# Patient Record
Sex: Female | Born: 1965 | Race: White | Hispanic: No | Marital: Married | Smoking: Never smoker
Health system: Southern US, Community
[De-identification: ages and names within clinical notes are randomized; demographics above are authoritative.]

## PROBLEM LIST (undated history)

## (undated) DIAGNOSIS — I519 Heart disease, unspecified: Secondary | ICD-10-CM

## (undated) DIAGNOSIS — G5600 Carpal tunnel syndrome, unspecified upper limb: Secondary | ICD-10-CM

## (undated) DIAGNOSIS — Z8489 Family history of other specified conditions: Secondary | ICD-10-CM

## (undated) DIAGNOSIS — M5134 Other intervertebral disc degeneration, thoracic region: Secondary | ICD-10-CM

## (undated) DIAGNOSIS — R011 Cardiac murmur, unspecified: Secondary | ICD-10-CM

## (undated) DIAGNOSIS — Z22338 Carrier of other streptococcus: Secondary | ICD-10-CM

## (undated) DIAGNOSIS — E669 Obesity, unspecified: Secondary | ICD-10-CM

## (undated) DIAGNOSIS — T7840XA Allergy, unspecified, initial encounter: Secondary | ICD-10-CM

## (undated) DIAGNOSIS — M810 Age-related osteoporosis without current pathological fracture: Secondary | ICD-10-CM

## (undated) DIAGNOSIS — M5136 Other intervertebral disc degeneration, lumbar region: Secondary | ICD-10-CM

## (undated) DIAGNOSIS — Z8719 Personal history of other diseases of the digestive system: Secondary | ICD-10-CM

## (undated) DIAGNOSIS — M51369 Other intervertebral disc degeneration, lumbar region without mention of lumbar back pain or lower extremity pain: Secondary | ICD-10-CM

## (undated) DIAGNOSIS — R002 Palpitations: Secondary | ICD-10-CM

## (undated) DIAGNOSIS — M1990 Unspecified osteoarthritis, unspecified site: Secondary | ICD-10-CM

## (undated) DIAGNOSIS — K219 Gastro-esophageal reflux disease without esophagitis: Secondary | ICD-10-CM

## (undated) DIAGNOSIS — M069 Rheumatoid arthritis, unspecified: Secondary | ICD-10-CM

## (undated) DIAGNOSIS — E559 Vitamin D deficiency, unspecified: Secondary | ICD-10-CM

## (undated) DIAGNOSIS — E785 Hyperlipidemia, unspecified: Secondary | ICD-10-CM

## (undated) HISTORY — DX: Gastro-esophageal reflux disease without esophagitis: K21.9

## (undated) HISTORY — DX: Carrier of other streptococcus: Z22.338

## (undated) HISTORY — PX: KNEE ARTHROPLASTY: SHX992

## (undated) HISTORY — DX: Hyperlipidemia, unspecified: E78.5

## (undated) HISTORY — DX: Rheumatoid arthritis, unspecified: M06.9

## (undated) HISTORY — PX: NO PAST SURGERIES: SHX2092

## (undated) HISTORY — DX: Unspecified osteoarthritis, unspecified site: M19.90

## (undated) HISTORY — DX: Age-related osteoporosis without current pathological fracture: M81.0

## (undated) HISTORY — PX: UPPER GASTROINTESTINAL ENDOSCOPY: SHX188

## (undated) HISTORY — DX: Allergy, unspecified, initial encounter: T78.40XA

## (undated) HISTORY — DX: Cardiac murmur, unspecified: R01.1

---
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
## 1998-06-14 ENCOUNTER — Other Ambulatory Visit: Admission: RE | Admit: 1998-06-14 | Discharge: 1998-06-14 | Payer: Self-pay | Admitting: Family Medicine

## 1999-07-31 ENCOUNTER — Other Ambulatory Visit: Admission: RE | Admit: 1999-07-31 | Discharge: 1999-07-31 | Payer: Self-pay | Admitting: Family Medicine

## 2000-09-03 ENCOUNTER — Other Ambulatory Visit: Admission: RE | Admit: 2000-09-03 | Discharge: 2000-09-03 | Payer: Self-pay | Admitting: Family Medicine

## 2001-09-30 ENCOUNTER — Other Ambulatory Visit: Admission: RE | Admit: 2001-09-30 | Discharge: 2001-09-30 | Payer: Self-pay | Admitting: Family Medicine

## 2001-12-23 ENCOUNTER — Encounter: Payer: Self-pay | Admitting: Family Medicine

## 2001-12-23 ENCOUNTER — Encounter: Admission: RE | Admit: 2001-12-23 | Discharge: 2001-12-23 | Payer: Self-pay | Admitting: Family Medicine

## 2002-03-30 ENCOUNTER — Encounter: Payer: Self-pay | Admitting: Family Medicine

## 2002-03-30 ENCOUNTER — Encounter: Admission: RE | Admit: 2002-03-30 | Discharge: 2002-03-30 | Payer: Self-pay | Admitting: Family Medicine

## 2002-10-19 ENCOUNTER — Encounter (HOSPITAL_COMMUNITY): Payer: Self-pay | Admitting: Oncology

## 2002-10-19 ENCOUNTER — Ambulatory Visit (HOSPITAL_COMMUNITY): Admission: RE | Admit: 2002-10-19 | Discharge: 2002-10-19 | Payer: Self-pay | Admitting: Oncology

## 2002-11-03 ENCOUNTER — Other Ambulatory Visit: Admission: RE | Admit: 2002-11-03 | Discharge: 2002-11-03 | Payer: Self-pay | Admitting: Family Medicine

## 2003-11-14 ENCOUNTER — Other Ambulatory Visit: Admission: RE | Admit: 2003-11-14 | Discharge: 2003-11-14 | Payer: Self-pay | Admitting: Family Medicine

## 2004-07-17 ENCOUNTER — Ambulatory Visit: Payer: Self-pay | Admitting: Family Medicine

## 2004-07-19 ENCOUNTER — Encounter: Admission: RE | Admit: 2004-07-19 | Discharge: 2004-07-19 | Payer: Self-pay | Admitting: Family Medicine

## 2004-09-24 ENCOUNTER — Ambulatory Visit: Payer: Self-pay | Admitting: Family Medicine

## 2004-10-08 ENCOUNTER — Ambulatory Visit: Payer: Self-pay

## 2004-11-18 ENCOUNTER — Ambulatory Visit: Payer: Self-pay | Admitting: Internal Medicine

## 2004-12-05 ENCOUNTER — Other Ambulatory Visit: Admission: RE | Admit: 2004-12-05 | Discharge: 2004-12-05 | Payer: Self-pay | Admitting: Internal Medicine

## 2004-12-05 ENCOUNTER — Ambulatory Visit: Payer: Self-pay | Admitting: Family Medicine

## 2004-12-19 ENCOUNTER — Ambulatory Visit: Payer: Self-pay | Admitting: Family Medicine

## 2005-01-10 ENCOUNTER — Encounter: Admission: RE | Admit: 2005-01-10 | Discharge: 2005-01-10 | Payer: Self-pay | Admitting: Family Medicine

## 2005-07-24 ENCOUNTER — Ambulatory Visit: Payer: Self-pay | Admitting: Family Medicine

## 2005-12-16 ENCOUNTER — Other Ambulatory Visit: Admission: RE | Admit: 2005-12-16 | Discharge: 2005-12-16 | Payer: Self-pay | Admitting: Family Medicine

## 2005-12-16 ENCOUNTER — Encounter: Payer: Self-pay | Admitting: Family Medicine

## 2005-12-16 ENCOUNTER — Ambulatory Visit: Payer: Self-pay | Admitting: Family Medicine

## 2005-12-18 ENCOUNTER — Ambulatory Visit: Payer: Self-pay | Admitting: Family Medicine

## 2006-09-19 ENCOUNTER — Emergency Department (HOSPITAL_COMMUNITY): Admission: EM | Admit: 2006-09-19 | Discharge: 2006-09-19 | Payer: Self-pay | Admitting: Family Medicine

## 2006-12-28 ENCOUNTER — Ambulatory Visit: Payer: Self-pay | Admitting: Family Medicine

## 2006-12-28 ENCOUNTER — Other Ambulatory Visit: Admission: RE | Admit: 2006-12-28 | Discharge: 2006-12-28 | Payer: Self-pay | Admitting: Family Medicine

## 2006-12-28 ENCOUNTER — Encounter: Payer: Self-pay | Admitting: Family Medicine

## 2006-12-28 DIAGNOSIS — K219 Gastro-esophageal reflux disease without esophagitis: Secondary | ICD-10-CM | POA: Insufficient documentation

## 2006-12-28 DIAGNOSIS — K449 Diaphragmatic hernia without obstruction or gangrene: Secondary | ICD-10-CM | POA: Insufficient documentation

## 2006-12-28 DIAGNOSIS — Z8719 Personal history of other diseases of the digestive system: Secondary | ICD-10-CM | POA: Insufficient documentation

## 2006-12-28 DIAGNOSIS — Z8679 Personal history of other diseases of the circulatory system: Secondary | ICD-10-CM | POA: Insufficient documentation

## 2007-01-04 ENCOUNTER — Encounter (INDEPENDENT_AMBULATORY_CARE_PROVIDER_SITE_OTHER): Payer: Self-pay | Admitting: *Deleted

## 2007-01-11 ENCOUNTER — Ambulatory Visit: Payer: Self-pay | Admitting: Family Medicine

## 2007-01-19 LAB — CONVERTED CEMR LAB
ALT: 23 units/L (ref 0–35)
AST: 17 units/L (ref 0–37)
Albumin: 3.2 g/dL — ABNORMAL LOW (ref 3.5–5.2)
Alkaline Phosphatase: 68 units/L (ref 39–117)
Basophils Relative: 0.4 % (ref 0.0–1.0)
Calcium: 8.8 mg/dL (ref 8.4–10.5)
Creatinine, Ser: 0.6 mg/dL (ref 0.4–1.2)
Eosinophils Relative: 1 % (ref 0.0–5.0)
Glucose, Bld: 83 mg/dL (ref 70–99)
HCT: 39.1 % (ref 36.0–46.0)
LDL Cholesterol: 124 mg/dL — ABNORMAL HIGH (ref 0–99)
Lymphocytes Relative: 18.4 % (ref 12.0–46.0)
MCV: 92.6 fL (ref 78.0–100.0)
Neutrophils Relative %: 74 % (ref 43.0–77.0)
RDW: 13.5 % (ref 11.5–14.6)
TSH: 1.87 microintl units/mL (ref 0.35–5.50)
Total Bilirubin: 0.6 mg/dL (ref 0.3–1.2)
Total CHOL/HDL Ratio: 5.2
Triglycerides: 107 mg/dL (ref 0–149)
VLDL: 21 mg/dL (ref 0–40)
WBC: 11.8 10*3/uL — ABNORMAL HIGH (ref 4.5–10.5)

## 2007-01-20 ENCOUNTER — Telehealth (INDEPENDENT_AMBULATORY_CARE_PROVIDER_SITE_OTHER): Payer: Self-pay | Admitting: *Deleted

## 2007-02-09 ENCOUNTER — Telehealth (INDEPENDENT_AMBULATORY_CARE_PROVIDER_SITE_OTHER): Payer: Self-pay | Admitting: *Deleted

## 2007-03-02 ENCOUNTER — Encounter: Admission: RE | Admit: 2007-03-02 | Discharge: 2007-03-02 | Payer: Self-pay | Admitting: Rheumatology

## 2007-03-02 ENCOUNTER — Ambulatory Visit: Payer: Self-pay | Admitting: Internal Medicine

## 2007-03-16 ENCOUNTER — Ambulatory Visit: Payer: Self-pay | Admitting: Internal Medicine

## 2007-03-16 ENCOUNTER — Encounter: Payer: Self-pay | Admitting: Family Medicine

## 2007-05-27 ENCOUNTER — Encounter: Payer: Self-pay | Admitting: Family Medicine

## 2007-07-09 ENCOUNTER — Emergency Department (HOSPITAL_COMMUNITY): Admission: EM | Admit: 2007-07-09 | Discharge: 2007-07-09 | Payer: Self-pay | Admitting: Emergency Medicine

## 2007-07-16 ENCOUNTER — Ambulatory Visit: Payer: Self-pay | Admitting: Family Medicine

## 2007-07-16 LAB — CONVERTED CEMR LAB: Rapid Strep: POSITIVE

## 2007-07-28 ENCOUNTER — Ambulatory Visit: Payer: Self-pay | Admitting: Family Medicine

## 2007-07-28 DIAGNOSIS — Z22330 Carrier of Group B streptococcus: Secondary | ICD-10-CM | POA: Insufficient documentation

## 2007-08-26 ENCOUNTER — Encounter: Payer: Self-pay | Admitting: Family Medicine

## 2007-11-09 ENCOUNTER — Telehealth: Payer: Self-pay | Admitting: Internal Medicine

## 2008-01-18 ENCOUNTER — Encounter: Payer: Self-pay | Admitting: Family Medicine

## 2008-01-20 ENCOUNTER — Telehealth (INDEPENDENT_AMBULATORY_CARE_PROVIDER_SITE_OTHER): Payer: Self-pay | Admitting: *Deleted

## 2008-05-03 ENCOUNTER — Encounter: Payer: Self-pay | Admitting: Family Medicine

## 2008-05-26 ENCOUNTER — Encounter: Payer: Self-pay | Admitting: Family Medicine

## 2008-06-28 ENCOUNTER — Encounter: Payer: Self-pay | Admitting: Family Medicine

## 2008-07-10 ENCOUNTER — Encounter: Payer: Self-pay | Admitting: Family Medicine

## 2008-07-10 ENCOUNTER — Ambulatory Visit (HOSPITAL_COMMUNITY): Admission: RE | Admit: 2008-07-10 | Discharge: 2008-07-10 | Payer: Self-pay | Admitting: Rheumatology

## 2008-09-11 ENCOUNTER — Encounter: Payer: Self-pay | Admitting: Family Medicine

## 2008-09-11 ENCOUNTER — Encounter: Admission: RE | Admit: 2008-09-11 | Discharge: 2008-09-11 | Payer: Self-pay | Admitting: Chiropractic Medicine

## 2008-09-19 ENCOUNTER — Encounter: Admission: RE | Admit: 2008-09-19 | Discharge: 2008-09-19 | Payer: Self-pay | Admitting: Family Medicine

## 2008-10-05 ENCOUNTER — Ambulatory Visit: Payer: Self-pay | Admitting: Family Medicine

## 2008-10-05 DIAGNOSIS — N898 Other specified noninflammatory disorders of vagina: Secondary | ICD-10-CM | POA: Insufficient documentation

## 2008-10-05 LAB — CONVERTED CEMR LAB
Glucose, Urine, Semiquant: NEGATIVE
Ketones, urine, test strip: NEGATIVE
Nitrite: NEGATIVE
WBC Urine, dipstick: NEGATIVE

## 2008-10-17 ENCOUNTER — Encounter (INDEPENDENT_AMBULATORY_CARE_PROVIDER_SITE_OTHER): Payer: Self-pay | Admitting: *Deleted

## 2008-10-17 LAB — CONVERTED CEMR LAB
AST: 21 units/L (ref 0–37)
Albumin: 3.4 g/dL — ABNORMAL LOW (ref 3.5–5.2)
Alkaline Phosphatase: 91 units/L (ref 39–117)
BUN: 15 mg/dL (ref 6–23)
Basophils Relative: 0.1 % (ref 0.0–3.0)
Bilirubin, Direct: 0 mg/dL (ref 0.0–0.3)
CO2: 29 meq/L (ref 19–32)
Calcium: 8.8 mg/dL (ref 8.4–10.5)
Chloride: 102 meq/L (ref 96–112)
Cholesterol: 221 mg/dL — ABNORMAL HIGH (ref 0–200)
Creatinine, Ser: 0.6 mg/dL (ref 0.4–1.2)
Eosinophils Absolute: 0.1 10*3/uL (ref 0.0–0.7)
Eosinophils Relative: 1.3 % (ref 0.0–5.0)
HCT: 41.2 % (ref 36.0–46.0)
HDL: 36.8 mg/dL — ABNORMAL LOW (ref >39.00)
Lymphs Abs: 2.3 10*3/uL (ref 0.7–4.0)
MCHC: 33.9 g/dL (ref 30.0–36.0)
MCV: 93.5 fL (ref 78.0–100.0)
Neutrophils Relative %: 66.6 % (ref 43.0–77.0)
TSH: 1.82 microintl units/mL (ref 0.35–5.50)
Total Bilirubin: 0.5 mg/dL (ref 0.3–1.2)
Total Protein: 7.6 g/dL (ref 6.0–8.3)
Triglycerides: 120 mg/dL (ref 0.0–149.0)

## 2008-10-18 ENCOUNTER — Ambulatory Visit: Payer: Self-pay | Admitting: Gynecology

## 2008-10-18 ENCOUNTER — Encounter: Payer: Self-pay | Admitting: Family Medicine

## 2008-10-24 ENCOUNTER — Ambulatory Visit: Payer: Self-pay | Admitting: Gynecology

## 2008-11-09 ENCOUNTER — Encounter: Admission: RE | Admit: 2008-11-09 | Discharge: 2008-11-09 | Payer: Self-pay | Admitting: Chiropractic Medicine

## 2008-11-13 ENCOUNTER — Ambulatory Visit: Payer: Self-pay | Admitting: Family Medicine

## 2008-11-17 ENCOUNTER — Encounter: Admission: RE | Admit: 2008-11-17 | Discharge: 2008-11-17 | Payer: Self-pay | Admitting: Chiropractic Medicine

## 2008-12-07 ENCOUNTER — Encounter: Payer: Self-pay | Admitting: Family Medicine

## 2009-02-12 ENCOUNTER — Telehealth (INDEPENDENT_AMBULATORY_CARE_PROVIDER_SITE_OTHER): Payer: Self-pay | Admitting: *Deleted

## 2009-02-22 ENCOUNTER — Ambulatory Visit: Payer: Self-pay | Admitting: Family Medicine

## 2009-02-22 DIAGNOSIS — L659 Nonscarring hair loss, unspecified: Secondary | ICD-10-CM | POA: Insufficient documentation

## 2009-02-26 ENCOUNTER — Encounter: Payer: Self-pay | Admitting: Family Medicine

## 2009-02-26 LAB — CONVERTED CEMR LAB
Chloride: 119 meq/L — ABNORMAL HIGH (ref 96–112)
Potassium: 3.9 meq/L (ref 3.5–5.1)
Sodium: 152 meq/L — ABNORMAL HIGH (ref 135–145)

## 2009-03-08 ENCOUNTER — Encounter: Payer: Self-pay | Admitting: Family Medicine

## 2009-05-23 ENCOUNTER — Telehealth (INDEPENDENT_AMBULATORY_CARE_PROVIDER_SITE_OTHER): Payer: Self-pay | Admitting: *Deleted

## 2009-05-24 ENCOUNTER — Ambulatory Visit: Payer: Self-pay | Admitting: Internal Medicine

## 2009-05-24 DIAGNOSIS — M069 Rheumatoid arthritis, unspecified: Secondary | ICD-10-CM | POA: Insufficient documentation

## 2009-05-24 DIAGNOSIS — N390 Urinary tract infection, site not specified: Secondary | ICD-10-CM | POA: Insufficient documentation

## 2009-05-24 LAB — CONVERTED CEMR LAB
Glucose, Urine, Semiquant: NEGATIVE
Nitrite: NEGATIVE
Urobilinogen, UA: 0.2
WBC Urine, dipstick: NEGATIVE
pH: 6.5

## 2009-05-25 ENCOUNTER — Encounter: Payer: Self-pay | Admitting: Internal Medicine

## 2009-05-28 ENCOUNTER — Telehealth (INDEPENDENT_AMBULATORY_CARE_PROVIDER_SITE_OTHER): Payer: Self-pay | Admitting: *Deleted

## 2009-06-04 ENCOUNTER — Telehealth (INDEPENDENT_AMBULATORY_CARE_PROVIDER_SITE_OTHER): Payer: Self-pay | Admitting: *Deleted

## 2009-10-17 ENCOUNTER — Ambulatory Visit: Payer: Self-pay | Admitting: Family Medicine

## 2009-10-17 DIAGNOSIS — R002 Palpitations: Secondary | ICD-10-CM | POA: Insufficient documentation

## 2009-10-18 ENCOUNTER — Encounter: Payer: Self-pay | Admitting: Family Medicine

## 2010-03-07 ENCOUNTER — Telehealth (INDEPENDENT_AMBULATORY_CARE_PROVIDER_SITE_OTHER): Payer: Self-pay | Admitting: *Deleted

## 2010-06-16 LAB — CONVERTED CEMR LAB
Basophils Absolute: 0 10*3/uL (ref 0.0–0.1)
Basophils Relative: 0.3 % (ref 0.0–3.0)
Chloride: 101 meq/L (ref 96–112)
Creatinine, Ser: 0.5 mg/dL (ref 0.4–1.2)
GFR calc non Af Amer: 138.69 mL/min (ref >60)
Glucose, Bld: 112 mg/dL — ABNORMAL HIGH (ref 70–99)
Hemoglobin: 14 g/dL (ref 12.0–15.0)
Monocytes Relative: 8 % (ref 3.0–12.0)
Neutro Abs: 7 10*3/uL (ref 1.4–7.7)
Neutrophils Relative %: 66.3 % (ref 43.0–77.0)
Platelets: 331 10*3/uL (ref 150.0–400.0)
Potassium: 4 meq/L (ref 3.5–5.1)
RDW: 15.1 % — ABNORMAL HIGH (ref 11.5–14.6)
Sodium: 137 meq/L (ref 135–145)
Troponin I: <0.01 ng/mL (ref <0.06)

## 2010-06-18 NOTE — Miscellaneous (Signed)
Summary: ECHO appt  ---- Converted from flag ---- ---- 10/18/2009 8:25 AM, Donna Lopez [facility] wrote: Dr. Beverely Low,  [name], this patient was scheduled for her Echo today, 10-18-2009.  I just noticed in IDX that the patient rescheduled her appt to 11-09-2009. ------------------------------

## 2010-06-18 NOTE — Progress Notes (Signed)
Summary: Lab Results   Phone Note Outgoing Call   Summary of Call: Regarding lab results, LMTCB:  No significant UTI present; stop Cipro. If symptoms persist or progress, Urology consultation may be needed to rule out primary bladder issues Signed by Marga Melnick MD on 05/27/2009 at 1:15 PM Initial call taken by: Army Fossa CMA,  May 28, 2009 8:56 AM  Follow-up for Phone Call        pt returned call i called the pt back and left a voicemail. Army Fossa CMA  May 28, 2009 11:57 AM  Additional Follow-up for Phone Call Additional follow up Details #1::        Pt is aware. She will call if her symptoms are not better by the middle of this week. Army Fossa CMA  May 28, 2009 4:24 PM

## 2010-06-18 NOTE — Progress Notes (Signed)
  Phone Note Call from Patient   Summary of Call: Pt called and left voicemail and would like an Rx to be faxed to her so she can send it to [facility]. Army Fossa CMA  June 04, 2009 3:48 PM    Prescriptions: NEXIUM 40 MG CPDR (ESOMEPRAZOLE MAGNESIUM) TAKE 1 CAPSULE ONCE DAILY  #90 x 2   Entered by:   Army Fossa CMA   Authorized by:   Loreen Freud DO   Signed by:   Army Fossa CMA on 06/04/2009   Method used:   Print then Give to Patient   RxID:   [id number]

## 2010-06-18 NOTE — Progress Notes (Signed)
Summary: NEXIUM REFILL  Phone Note Refill Request Call back at Work Phone [phone] Message from:  Patient on March 07, 2010 9:50 AM  Refills Requested: Medication #1:  NEXIUM 40 MG CPDR TAKE 1 CAPSULE ONCE DAILY PATIENT STATES THAT SHE USUALLY PICKS UP THIS PRESCRIPTION AND MAILS IT HERSELF TO [facility] = MAIL ORDER ---SHE WOULD LIKE [place] TO FAX IT FOR HER THIS TIME     FAX [phone]    PHONE  [phone]  Initial call taken by: Jerolyn Shin,  March 07, 2010 9:59 AM    Prescriptions: NEXIUM 40 MG CPDR (ESOMEPRAZOLE MAGNESIUM) TAKE 1 CAPSULE ONCE DAILY  #90 x 1   Entered by:   Almeta Monas CMA (AAMA)   Authorized by:   Loreen Freud DO   Signed by:   Almeta Monas CMA (AAMA) on 03/07/2010   Method used:   Faxed to ...       [facility] (mail-order)             , [place]         Ph:        Fax: [phone]   RxID:   [id number]

## 2010-06-18 NOTE — Assessment & Plan Note (Signed)
Summary: heart flutters//lch   Vital Signs:  Patient profile:   45 year old female Height:      62.5 inches Weight:      253 pounds BMI:     45.70 O2 Sat:      97 % on Room air Pulse rate:   118 / minute BP sitting:   120 / 80  (left arm)  Vitals Entered By: Doristine Devoid (October 17, 2009 1:53 PM)  O2 Flow:  Room air CC: heart flutters feels like she is skipping a beat and sometimes w/ cough no chest pain or SOB   History of Present Illness: 45 yo woman here today for 'heart fluttering'.  sxs started yesterday morning when she woke up.  eliminated caffeine and carbonation yesterday thinking it was GERD but sxs didn't improve.  denies chest pain.  feels like heart may be 'pausing'.  no SOB.  + cough- dry.  + belching, 'a lot'.  no N/V, change in diet.  no recent travel, immobilization, OCPs, leg pain or swelling.  no hx of heart dz.  Current Medications (verified): 1)  Nexium 40 Mg Cpdr (Esomeprazole Magnesium) .... Take 1 Capsule Once Daily 2)  Humira Pen 40 Mg/0.47ml  Kit (Adalimumab) .Marland Kitchen.. 1 Injection Once Every Two Weeks 3)  Methotrexate 2.5 Mg Tabs (Methotrexate Sodium) .... Take 8 Tablets Once Weekly 4)  B-6 Folic Acid [phone] Mcg-Mcg-Mg Caps (Cobalamine Combinations) .... Daily  Allergies (verified): 1)  ! Pcn  Past History:  Family History: Last updated: 12/28/2006 Family History of Colon CA 1st degree relative <60 Family History Diabetes 1st degree relative Family History Uterine cancer  Review of Systems      See HPI  Physical Exam  General:  well-nourished,in no acute distress; alert,appropriate and cooperative throughout examination;overweight-appearing.   Head:  Normocephalic and atraumatic without obvious abnormalities. Eyes:  PERRL, EOMI Neck:  No deformities, masses, or tenderness noted. Lungs:  Normal respiratory effort, chest expands symmetrically. Lungs are clear to auscultation, no crackles or wheezes. Heart:  Slightly tachy but regular rhythm. S1  and S2 normal without gallop, murmur, click, rub or other extra sounds. Pulses:  rapid but regular   Impression & Recommendations:  Problem # 1:  PALPITATIONS (ICD-785.1) Assessment New pt's EKG unchanged from 8/08, HR 100.  possible causes arrhythmia, thyroid abnormality, anemia, electrolyte imbalance.  considered PE but pt w/out leg pain, swelling, chest pain, shortness of breath, recent immobility, OCP use, or other risk factors.  given her RA likely that Ddimer would be + and pt would require a CT scan despite low suspicion.  given pt's increased gas and belching must consider atypical MI- check stat CEs.  will arrange ECHO and holter monitor.  close f/u.  reviewed sxs that should prompt immediate attention- pt aware. Orders: EKG w/ Interpretation (93000) Venipuncture (96045) TLB-BMP (Basic Metabolic Panel-BMET) (80048-METABOL) TLB-CBC Platelet - w/Differential (85025-CBCD) TLB-TSH (Thyroid Stimulating Hormone) (84443-TSH) TLB-CK-MB (Creatine Kinase MB) (82553-CKMB) T- * Misc. Laboratory test [phone]) Cardiology Referral (Cardiology)  Complete Medication List: 1)  Nexium 40 Mg Cpdr (Esomeprazole magnesium) .... Take 1 capsule once daily 2)  Humira Pen 40 Mg/0.61ml Kit (Adalimumab) .Marland Kitchen.. 1 injection once every two weeks 3)  Methotrexate 2.5 Mg Tabs (Methotrexate sodium) .... Take 8 tablets once weekly 4)  B-6 Folic Acid [phone] Mcg-mcg-mg Caps (Cobalamine combinations) .... Daily  Patient Instructions: 1)  Follow up on Friday 2)  We will notify you of your lab results 3)  If your symptoms worsen- chest pain,  shortness of breath, or other concerns- please go to the ER 4)  Try Gas-X for the belching 5)  Continue the Nexium 6)  Someone will call you with the appt to get your Holter Monitor and ECHO 7)  HANG IN THERE!

## 2010-06-18 NOTE — Progress Notes (Signed)
Summary: HAS UTI---NEEDS TO BE SEEN TODAY  Phone Note Call from Patient Call back at Work Phone [phone]   Caller: Patient Summary of Call: HAS URINARY TRACT INF---AND NO ONE HAS ANY SLOTS TODAY---CAN WE SQUEEZE HER IN Initial call taken by: Jerolyn Shin,  May 23, 2009 11:19 AM  Follow-up for Phone Call        pt offer appt today with o'sullivan taoday pt decline OV schedule for tomorrow, advise if symptoms worsen or change prior to appt we recommend UC, pt ok................Marland KitchenFelecia Deloach CMA  May 23, 2009 12:14 PM

## 2010-06-18 NOTE — Assessment & Plan Note (Signed)
Summary: UTI///fd   Vital Signs:  Patient profile:   45 year old female Weight:      261.2 pounds Temp:     98.3 degrees F oral Pulse rate:   94 / minute Resp:     17 per minute BP sitting:   124 / 86  (left arm) Cuff size:   large  Vitals Entered By: Shonna Chock (May 24, 2009 12:07 PM) CC: UTI Comments REVIEWED MED LIST, PATIENT AGREED DOSE AND INSTRUCTION CORRECT    Primary Care Provider:  Laury Axon  CC:  UTI.  History of Present Illness: Progressive dysuria since 05/20/2009 despite cranberry juice & baking soda. No recent UTI for 19 years. 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 On MTX & humera  for RA.  Allergies: 1)  ! Pcn  Review of Systems General:  Denies chills, fever, and sweats. GI:  Denies abdominal pain, change in bowel habits, and diarrhea. GU:  Complains of urinary frequency; denies discharge and hematuria; No flank pain.  Physical Exam  General:  well-nourished,in no acute distress; alert,appropriate and cooperative throughout examination;overweight-appearing.   Abdomen:  Bowel sounds positive,abdomen soft and non-tender without masses, organomegaly or hernias noted. Msk:  "Crawls" up & down  exam table; no flank tenderness Neurologic:  strength normal in lower  extremities and DTRs symmetrical and normal.   Neg SLR Skin:  Intact without suspicious lesions or rashes Psych:  memory intact for recent and remote, normally interactive, and good eye contact.     Impression & Recommendations:  Problem # 1:  UTI (ICD-599.0)  Orders: UA Dipstick w/o Micro (manual) (09811) T-Culture, Urine (91478-29562)  Her updated medication list for this problem includes:    Ciprofloxacin Hcl 500 Mg Tabs (Ciprofloxacin hcl) .Marland Kitchen... 1 two times a day  Problem # 2:  RHEUMATOID ARTHRITIS (ICD-714.0)  Complete Medication List: 1)  Nexium 40 Mg Cpdr (Esomeprazole magnesium) .... Take 1 capsule once daily 2)  Humira Pen 40 Mg/0.61ml Kit (Adalimumab) .Marland Kitchen.. 1 injection once  every two weeks 3)  Methotrexate 2.5 Mg Tabs (Methotrexate sodium) .... Take 8 tablets once weekly 4)  B-6 Folic Acid [phone] Mcg-mcg-mg Caps (Cobalamine combinations) .... Daily 5)  Ciprofloxacin Hcl 500 Mg Tabs (Ciprofloxacin hcl) .Marland Kitchen.. 1 two times a day 6)  Phenazo 200 Mg Tabs (Phenazopyridine hcl) .Marland Kitchen.. 1 three times a day as needed for burning  Patient Instructions: 1)  Drink as much fluid as you can tolerate for the next few days. Prescriptions: PHENAZO 200 MG TABS (PHENAZOPYRIDINE HCL) 1 three times a day as needed for burning  #15 x 0   Entered and Authorized by:   Marga Melnick MD   Signed by:   Marga Melnick MD on 05/24/2009   Method used:   Faxed to ...       [facility]  [address] (retail)       [address]       [place], [place]  [address]       Ph: [phone] or [phone]       Fax: [phone]   RxID:   [id number] CIPROFLOXACIN HCL 500 MG TABS (CIPROFLOXACIN HCL) 1 two times a day  #14 x 0   Entered and Authorized by:   Marga Melnick MD   Signed by:   Marga Melnick MD on 05/24/2009   Method used:   Faxed to ...       [facility]  [address] [phone][facility] (retail)       [address]  [place], [place]  [address]       Ph: [phone] or [phone]       Fax: [phone]   RxID:   [id number]   Laboratory Results   Urine Tests    Routine Urinalysis   Color: straw Appearance: Clear Glucose: negative   (Normal Range: Negative) Bilirubin: negative   (Normal Range: Negative) Ketone: negative   (Normal Range: Negative) Spec. Gravity: 1.010   (Normal Range: 1.003-1.035) Blood: trace-lysed   (Normal Range: Negative) pH: 6.5   (Normal Range: 5.0-8.0) Protein: negative   (Normal Range: Negative) Urobilinogen: 0.2   (Normal Range: 0-1) Nitrite: negative   (Normal Range: Negative) Leukocyte Esterace: negative   (Normal Range: Negative)

## 2010-07-15 ENCOUNTER — Encounter: Payer: Self-pay | Admitting: Family Medicine

## 2010-07-30 NOTE — Letter (Signed)
Summary: [facility]  [facility]   Imported By: Maryln Gottron 07/23/2010 12:09:21  _____________________________________________________________________  External Attachment:    Type:   Image     Comment:   External Document

## 2010-08-29 ENCOUNTER — Telehealth: Payer: Self-pay | Admitting: Family Medicine

## 2010-08-29 NOTE — Telephone Encounter (Signed)
Patient needs refill for Nexium called into [facility]   Fax=[phone]

## 2010-08-30 MED ORDER — ESOMEPRAZOLE MAGNESIUM 40 MG PO CPDR: 40.0000 mg | DELAYED_RELEASE_CAPSULE | Freq: Every day | ORAL | Status: DC

## 2010-08-30 NOTE — Telephone Encounter (Signed)
Faxed.   KP 

## 2010-12-10 ENCOUNTER — Other Ambulatory Visit: Payer: Self-pay | Admitting: Family Medicine

## 2010-12-10 DIAGNOSIS — Z1231 Encounter for screening mammogram for malignant neoplasm of breast: Secondary | ICD-10-CM

## 2010-12-17 ENCOUNTER — Ambulatory Visit
Admission: RE | Admit: 2010-12-17 | Discharge: 2010-12-17 | Disposition: A | Discharge status: Home / Self Care | Payer: BC Managed Care – PPO | Source: Ambulatory Visit | Attending: Family Medicine | Admitting: Family Medicine

## 2010-12-17 DIAGNOSIS — Z1231 Encounter for screening mammogram for malignant neoplasm of breast: Secondary | ICD-10-CM

## 2011-02-07 LAB — INFLUENZA A AND B ANTIGEN (CONVERTED LAB)
Inflenza A Ag: NEGATIVE
Influenza B Ag: NEGATIVE

## 2011-02-28 ENCOUNTER — Encounter: Payer: Self-pay | Admitting: Family Medicine

## 2011-03-03 ENCOUNTER — Encounter: Payer: Self-pay | Admitting: Family Medicine

## 2011-03-03 ENCOUNTER — Ambulatory Visit (INDEPENDENT_AMBULATORY_CARE_PROVIDER_SITE_OTHER): Payer: BC Managed Care – PPO | Admitting: Family Medicine

## 2011-03-03 DIAGNOSIS — Z0000 Encounter for general adult medical examination without abnormal findings: Secondary | ICD-10-CM

## 2011-03-03 DIAGNOSIS — K219 Gastro-esophageal reflux disease without esophagitis: Secondary | ICD-10-CM

## 2011-03-03 DIAGNOSIS — M069 Rheumatoid arthritis, unspecified: Secondary | ICD-10-CM

## 2011-03-03 DIAGNOSIS — R609 Edema, unspecified: Secondary | ICD-10-CM

## 2011-03-03 MED ORDER — HYDROCHLOROTHIAZIDE 25 MG PO TABS: 25.0000 mg | ORAL_TABLET | Freq: Every day | ORAL | Status: DC

## 2011-03-03 MED ORDER — ESOMEPRAZOLE MAGNESIUM 40 MG PO CPDR: 40.0000 mg | DELAYED_RELEASE_CAPSULE | Freq: Every day | ORAL | Status: DC

## 2011-03-03 NOTE — Progress Notes (Signed)
Subjective:    Patient ID: Donna Lopez, female    DOB: [birth year], 44 y.o.   MRN: [id number]  HPI    Review of Systems     Objective:   Physical Exam        Assessment & Plan:   Subjective:     Donna Lopez is a 45 y.o. female and is here for a comprehensive physical exam. The patient reports problems - esr was elevated ---more elevated than previously.  History   Social History  . Marital Status: Married    Spouse Name: N/A    Number of Children: N/A  . Years of Education: N/A   Occupational History  .  [employer]   Social History Main Topics  . Smoking status: Never Smoker   . Smokeless tobacco: Not on file  . Alcohol Use: No  . Drug Use: No  . Sexually Active: Yes -- Female partner(s)   Other Topics Concern  . Not on file   Social History Narrative   Walks 3x a week   Health Maintenance  Topic Date Due  . Pap Smear  12/08/1982  . Influenza Vaccine  02/17/2011  . Tetanus/tdap  09/30/2011    The following portions of the patient's history were reviewed and updated as appropriate: allergies, current medications, past family history, past medical history, past social history, past surgical history and problem list.  Review of Systems Review of Systems  Constitutional: Negative for activity change, appetite change and fatigue.  HENT: Negative for hearing loss, congestion, tinnitus and ear discharge.  dentist q31m Eyes: Negative for visual disturbance (see optho q1y -- vision corrected to 20/20 with glasses).  Respiratory: Negative for cough, chest tightness and shortness of breath.   Cardiovascular: Negative for chest pain, palpitations and leg swelling.  Gastrointestinal: Negative for abdominal pain, diarrhea, constipation and abdominal distention.  Genitourinary: Negative for urgency, frequency, decreased urine volume and difficulty urinating.  Musculoskeletal: Negative for back pain, arthralgias and gait problem.  Skin: Negative for color change,  pallor and rash.  Neurological: Negative for dizziness, light-headedness, numbness and headaches.  Hematological: Negative for adenopathy. Does not bruise/bleed easily.  Psychiatric/Behavioral: Negative for suicidal ideas, confusion, sleep disturbance, self-injury, dysphoric mood, decreased concentration and agitation.       Objective:    BP 130/90  Pulse 80  Temp(Src) 98.1 F (36.7 C) (Oral)  Resp 16  Ht 5\' 3"  (1.6 m)  Wt 244 lb (110.678 kg)  BMI 43.22 kg/m2 General appearance: alert, cooperative, appears stated age, no distress and morbidly obese Head: Normocephalic, without obvious abnormality, atraumatic Eyes: conjunctivae/corneas clear. PERRL, EOM's intact. Fundi benign. Ears: normal TM's and external ear canals both ears Nose: Nares normal. Septum midline. Mucosa normal. No drainage or sinus tenderness. Throat: lips, mucosa, and tongue normal; teeth and gums normal Neck: no adenopathy, no carotid bruit, no JVD, supple, symmetrical, trachea midline and thyroid not enlarged, symmetric, no tenderness/mass/nodules Back: symmetric, no curvature. ROM normal. No CVA tenderness. Lungs: clear to auscultation bilaterally Breasts: normal appearance, no masses or tenderness Heart: regular rate and rhythm, S1, S2 normal, no murmur, click, rub or gallop Abdomen: soft, non-tender; bowel sounds normal; no masses,  no organomegaly Pelvic: gyn Extremities: edema tr edema Pulses: 2+ and symmetric Skin: Skin color, texture, turgor normal. No rashes or lesions Lymph nodes: Cervical, supraclavicular, and axillary nodes normal. Neurologic: Alert and oriented X 3, normal strength and tone. Normal symmetric reflexes. Normal coordination and gait psych--no depression, no anxiety    Assessment:  Healthy female exam.  Edema-- restart hctz, elevated legs ,  Support hose RA-- with esr more elevated then usual--recheck today and get results from Rheum     Plan:  ghm utd Check fasting  labs Pap with gyn   See After Visit Summary for Counseling Recommendations

## 2011-03-03 NOTE — Patient Instructions (Signed)

## 2011-03-04 ENCOUNTER — Other Ambulatory Visit: Payer: Self-pay | Admitting: Family Medicine

## 2011-03-04 DIAGNOSIS — Z0000 Encounter for general adult medical examination without abnormal findings: Secondary | ICD-10-CM

## 2011-03-05 ENCOUNTER — Other Ambulatory Visit: Payer: BC Managed Care – PPO

## 2011-03-05 NOTE — Progress Notes (Signed)
Labs only

## 2011-04-24 ENCOUNTER — Other Ambulatory Visit: Payer: Self-pay | Admitting: Obstetrics and Gynecology

## 2011-04-24 ENCOUNTER — Other Ambulatory Visit (HOSPITAL_COMMUNITY)
Admission: RE | Admit: 2011-04-24 | Discharge: 2011-04-24 | Disposition: A | Discharge status: Home / Self Care | Payer: BC Managed Care – PPO | Source: Ambulatory Visit | Attending: Obstetrics and Gynecology | Admitting: Obstetrics and Gynecology

## 2011-04-24 DIAGNOSIS — Z01419 Encounter for gynecological examination (general) (routine) without abnormal findings: Secondary | ICD-10-CM | POA: Insufficient documentation

## 2011-05-20 DIAGNOSIS — I5189 Other ill-defined heart diseases: Secondary | ICD-10-CM

## 2011-05-20 HISTORY — DX: Other ill-defined heart diseases: I51.89

## 2011-10-08 ENCOUNTER — Encounter: Payer: Self-pay | Admitting: Family Medicine

## 2011-10-08 ENCOUNTER — Ambulatory Visit (INDEPENDENT_AMBULATORY_CARE_PROVIDER_SITE_OTHER)
Admission: RE | Admit: 2011-10-08 | Discharge: 2011-10-08 | Disposition: A | Discharge status: Home / Self Care | Payer: BC Managed Care – PPO | Source: Ambulatory Visit | Attending: Family Medicine | Admitting: Family Medicine

## 2011-10-08 ENCOUNTER — Ambulatory Visit (INDEPENDENT_AMBULATORY_CARE_PROVIDER_SITE_OTHER): Payer: BC Managed Care – PPO | Admitting: Family Medicine

## 2011-10-08 DIAGNOSIS — R079 Chest pain, unspecified: Secondary | ICD-10-CM

## 2011-10-08 DIAGNOSIS — R911 Solitary pulmonary nodule: Secondary | ICD-10-CM

## 2011-10-08 DIAGNOSIS — R609 Edema, unspecified: Secondary | ICD-10-CM

## 2011-10-08 LAB — D-DIMER, QUANTITATIVE: D-Dimer, Quant: 0.33 ug/mL-FEU (ref 0.00–0.48)

## 2011-10-08 LAB — HEPATIC FUNCTION PANEL
ALT: 17 U/L (ref 0–35)
AST: 15 U/L (ref 0–37)
Total Bilirubin: 0.5 mg/dL (ref 0.3–1.2)
Total Protein: 7.3 g/dL (ref 6.0–8.3)

## 2011-10-08 LAB — BASIC METABOLIC PANEL
Chloride: 102 mEq/L (ref 96–112)
GFR: 118.52 mL/min (ref >60.00)
Potassium: 3.7 mEq/L (ref 3.5–5.1)
Sodium: 137 mEq/L (ref 135–145)

## 2011-10-08 LAB — CARDIAC PANEL
CK-MB: 1.2 ng/mL (ref 0.3–4.0)
Relative Index: 1.8 calc (ref 0.0–2.5)
Total CK: 67 U/L (ref 7–177)

## 2011-10-08 LAB — TSH: TSH: 1.34 u[IU]/mL (ref 0.35–5.50)

## 2011-10-08 LAB — CBC WITH DIFFERENTIAL/PLATELET
Basophils Relative: 0 % (ref 0.0–3.0)
Eosinophils Relative: 0.5 % (ref 0.0–5.0)
HCT: 38.2 % (ref 36.0–46.0)
Hemoglobin: 12.9 g/dL (ref 12.0–15.0)
Lymphs Abs: 2.2 10*3/uL (ref 0.7–4.0)
MCV: 89.9 fl (ref 78.0–100.0)
Monocytes Absolute: 1 10*3/uL (ref 0.1–1.0)
Monocytes Relative: 8.3 % (ref 3.0–12.0)
Platelets: 277 10*3/uL (ref 150.0–400.0)
RBC: 4.25 Mil/uL (ref 3.87–5.11)
WBC: 11.9 10*3/uL — ABNORMAL HIGH (ref 4.5–10.5)

## 2011-10-08 LAB — TROPONIN I: Troponin I: <0.01 ng/mL (ref <0.06)

## 2011-10-08 MED ORDER — HYDROCHLOROTHIAZIDE 25 MG PO TABS: 25.0000 mg | ORAL_TABLET | Freq: Every day | ORAL | Status: DC

## 2011-10-08 MED ORDER — MELOXICAM 15 MG PO TABS: 15.0000 mg | ORAL_TABLET | Freq: Every day | ORAL | Status: DC

## 2011-10-08 NOTE — Patient Instructions (Signed)
Chest Pain (Nonspecific) It is often hard to give a specific diagnosis for the cause of chest pain. There is always a chance that your pain could be related to something serious, such as a heart attack or a blood clot in the lungs. You need to follow up with your caregiver for further evaluation. CAUSES   Heartburn.   Pneumonia or bronchitis.   Anxiety or stress.   Inflammation around your heart (pericarditis) or lung (pleuritis or pleurisy).   A blood clot in the lung.   A collapsed lung (pneumothorax). It can develop suddenly on its own (spontaneous pneumothorax) or from injury (trauma) to the chest.   Shingles infection (herpes zoster virus).  The chest wall is composed of bones, muscles, and cartilage. Any of these can be the source of the pain.  The bones can be bruised by injury.   The muscles or cartilage can be strained by coughing or overwork.   The cartilage can be affected by inflammation and become sore (costochondritis).  DIAGNOSIS  Lab tests or other studies, such as X-rays, electrocardiography, stress testing, or cardiac imaging, may be needed to find the cause of your pain.  TREATMENT   Treatment depends on what may be causing your chest pain. Treatment may include:   Acid blockers for heartburn.   Anti-inflammatory medicine.   Pain medicine for inflammatory conditions.   Antibiotics if an infection is present.   You may be advised to change lifestyle habits. This includes stopping smoking and avoiding alcohol, caffeine, and chocolate.   You may be advised to keep your head raised (elevated) when sleeping. This reduces the chance of acid going backward from your stomach into your esophagus.   Most of the time, nonspecific chest pain will improve within 2 to 3 days with rest and mild pain medicine.  HOME CARE INSTRUCTIONS   If antibiotics were prescribed, take your antibiotics as directed. Finish them even if you start to feel better.   For the next few  days, avoid physical activities that bring on chest pain. Continue physical activities as directed.   Do not smoke.   Avoid drinking alcohol.   Only take over-the-counter or prescription medicine for pain, discomfort, or fever as directed by your caregiver.   Follow your caregiver's suggestions for further testing if your chest pain does not go away.   Keep any follow-up appointments you made. If you do not go to an appointment, you could develop lasting (chronic) problems with pain. If there is any problem keeping an appointment, you must call to reschedule.  SEEK MEDICAL CARE IF:   You think you are having problems from the medicine you are taking. Read your medicine instructions carefully.   Your chest pain does not go away, even after treatment.   You develop a rash with blisters on your chest.  SEEK IMMEDIATE MEDICAL CARE IF:   You have increased chest pain or pain that spreads to your arm, neck, jaw, back, or abdomen.   You develop shortness of breath, an increasing cough, or you are coughing up blood.   You have severe back or abdominal pain, feel nauseous, or vomit.   You develop severe weakness, fainting, or chills.   You have a fever.  THIS IS AN EMERGENCY. Do not wait to see if the pain will go away. Get medical help at once. Call your local emergency services (911 in [place] Do not drive yourself to the hospital. MAKE SURE YOU:   Understand these instructions.     Will watch your condition.   Will get help right away if you are not doing well or get worse.  Document Released: 02/12/2005 Document Revised: 04/24/2011 Document Reviewed: 12/09/2007 Exit[employer] Patient Information 2012 [employer]. 

## 2011-10-08 NOTE — Progress Notes (Signed)
  Subjective:    Donna Lopez is a 46 y.o. female who presents for evaluation of chest pain. Onset was 3 days ago. Symptoms have been stable since that time. The patient describes the pain as sharp and radiates to the upper back. Patient rates pain as a 9/10 in intensity. Associated symptoms are: chest pain. Aggravating factors are: moving. Alleviating factors are: NSAIDs. Patient's cardiac risk factors are: hypertension, obesity (BMI >= 30 kg/m2) and sedentary lifestyle. Patient's risk factors for DVT/PE: none. Previous cardiac testing: electrocardiogram (ECG).  The following portions of the patient's history were reviewed and updated as appropriate: allergies, current medications, past family history, past medical history, past social history, past surgical history and problem list.  Review of Systems Pertinent items are noted in HPI.    Objective:    BP 122/80  Pulse 106  Temp(Src) 98.6 F (37 C) (Oral)  Wt 249 lb 3.2 oz (113.036 kg)  SpO2 97% General appearance: alert, cooperative, appears stated age and no distress Throat: lips, mucosa, and tongue normal; teeth and gums normal Neck: no adenopathy, no carotid bruit, no JVD, supple, symmetrical, trachea midline and thyroid not enlarged, symmetric, no tenderness/mass/nodules Lungs: clear to auscultation bilaterally Heart: S1, S2 normal Lymph nodes: Cervical, supraclavicular, and axillary nodes normal. Chest--  + tenderness with palpation L upper chest and slightly laterally                    Pain with inspiration  Cardiographics ECG: sinus tach  Imaging Chest x-ray: not available for review    Assessment:    Chest pain, suspected etiology: chest wall pain    Plan:    Patient history and exam consistent with non-cardiac cause of chest pain. Conservative measures indicated. Prescription NSAIDs per medication orders. to ER if pain worsens  Check laba

## 2011-10-09 ENCOUNTER — Telehealth: Payer: Self-pay | Admitting: *Deleted

## 2011-10-09 MED ORDER — CLARITHROMYCIN ER 500 MG PO TB24: ORAL_TABLET | ORAL | Status: DC

## 2011-10-09 NOTE — Telephone Encounter (Signed)
Pt notified and rx was sent to pharmacy.  Notes Recorded by Lelon Perla, DO on 10/09/2011 at 3:34 PM biaxin xl 2 po qd for 14 days Notes Recorded by Kathi Simpers, CMA on 10/09/2011 at 3:13 PM Pt returned my call and was notified of result. She states she has had a cold with head congestion, left ear feels stopped up. Reports congestion feels better today but has swelling along her left jaw line from the ear forward since last night. Denies shortness of breath or other new symptoms. Please advise. Notes Recorded by Kathi Simpers, CMA on 10/09/2011 at 2:00 PM Left detailed message on cell # re: results and to let us know if she has had any fever or illness. Notes Recorded by Arnette Norris, CMA on 10/08/2011 at 5:46 PM msg left on machine KP Notes Recorded by Lelon Perla, DO on 10/08/2011 at 5:25 PM Wbc elevated---- Has pt had any fevers, congestion?  Rest of labs normal.

## 2011-10-22 ENCOUNTER — Other Ambulatory Visit (HOSPITAL_COMMUNITY): Payer: BC Managed Care – PPO

## 2011-10-30 ENCOUNTER — Ambulatory Visit (HOSPITAL_COMMUNITY): Payer: BC Managed Care – PPO | Attending: Cardiology | Admitting: Radiology

## 2011-10-30 DIAGNOSIS — R002 Palpitations: Secondary | ICD-10-CM | POA: Insufficient documentation

## 2011-10-30 DIAGNOSIS — R609 Edema, unspecified: Secondary | ICD-10-CM | POA: Insufficient documentation

## 2011-10-30 DIAGNOSIS — R011 Cardiac murmur, unspecified: Secondary | ICD-10-CM

## 2011-10-30 DIAGNOSIS — E669 Obesity, unspecified: Secondary | ICD-10-CM | POA: Insufficient documentation

## 2011-10-30 DIAGNOSIS — I517 Cardiomegaly: Secondary | ICD-10-CM | POA: Insufficient documentation

## 2011-10-30 NOTE — Progress Notes (Signed)
Echocardiogram performed.  

## 2012-01-01 ENCOUNTER — Encounter: Payer: Self-pay | Admitting: *Deleted

## 2012-01-06 ENCOUNTER — Encounter: Payer: Self-pay | Admitting: Internal Medicine

## 2012-03-12 ENCOUNTER — Other Ambulatory Visit: Payer: Self-pay

## 2012-03-12 DIAGNOSIS — K219 Gastro-esophageal reflux disease without esophagitis: Secondary | ICD-10-CM

## 2012-03-12 MED ORDER — ESOMEPRAZOLE MAGNESIUM 40 MG PO CPDR: 40.0000 mg | DELAYED_RELEASE_CAPSULE | Freq: Every day | ORAL | Status: DC

## 2012-04-09 ENCOUNTER — Encounter: Payer: Self-pay | Admitting: Internal Medicine

## 2012-05-14 ENCOUNTER — Encounter: Payer: Self-pay | Admitting: Gastroenterology

## 2012-05-14 ENCOUNTER — Encounter: Payer: Self-pay | Admitting: Internal Medicine

## 2012-05-19 HISTORY — PX: COLONOSCOPY: SHX174

## 2012-05-26 ENCOUNTER — Ambulatory Visit (AMBULATORY_SURGERY_CENTER): Payer: BC Managed Care – PPO | Admitting: *Deleted

## 2012-05-26 VITALS — Ht 63.0 in | Wt 255.4 lb

## 2012-05-26 DIAGNOSIS — Z800 Family history of malignant neoplasm of digestive organs: Secondary | ICD-10-CM

## 2012-05-26 DIAGNOSIS — Z1211 Encounter for screening for malignant neoplasm of colon: Secondary | ICD-10-CM

## 2012-05-26 MED ORDER — MOVIPREP 100 G PO SOLR: 1.0000 | Freq: Once | ORAL | Status: DC

## 2012-05-26 NOTE — Progress Notes (Signed)
No egg or soy allergy. ewm 

## 2012-06-08 ENCOUNTER — Encounter: Payer: Self-pay | Admitting: Internal Medicine

## 2012-06-08 ENCOUNTER — Ambulatory Visit (AMBULATORY_SURGERY_CENTER): Payer: BC Managed Care – PPO | Admitting: Internal Medicine

## 2012-06-08 VITALS — BP 95/62 | HR 88 | Temp 97.9°F | Resp 17 | Ht 63.0 in | Wt 255.0 lb

## 2012-06-08 DIAGNOSIS — Z1211 Encounter for screening for malignant neoplasm of colon: Secondary | ICD-10-CM

## 2012-06-08 DIAGNOSIS — Z800 Family history of malignant neoplasm of digestive organs: Secondary | ICD-10-CM

## 2012-06-08 MED ORDER — SODIUM CHLORIDE 0.9 % IV SOLN: 500.0000 mL | INTRAVENOUS | Status: DC

## 2012-06-08 NOTE — Progress Notes (Signed)
Pt A&O, VSS, Pleased with MAC, Report to Ardeen Jourdain RN, DRM

## 2012-06-08 NOTE — Progress Notes (Signed)
Patient did not experience any of the following events: a burn prior to discharge; a fall within the facility; wrong site/side/patient/procedure/implant event; or a hospital transfer or hospital admission upon discharge from the facility. (G8907) Patient did not have preoperative order for IV antibiotic SSI prophylaxis. (G8918)  

## 2012-06-08 NOTE — Patient Instructions (Addendum)

## 2012-06-08 NOTE — Op Note (Signed)
[facility] [address] [place], [address]   COLONOSCOPY PROCEDURE REPORT  PATIENT: Donna Lopez, Donna Lopez  MR#: [id number] BIRTHDATE: 23-Jan-1966 , 47  yrs. old GENDER: Female ENDOSCOPIST: Hart Carwin, MD REFERRED BY:  recall colonoscopy PROCEDURE DATE:  06/08/2012 PROCEDURE:   Colonoscopy, screening ASA CLASS:   Class II INDICATIONS:Patient's immediate family history of colon cancer and sister with colon cancer at age 48, last colon 2008. MEDICATIONS: MAC sedation, administered by CRNA and Propofol (Diprivan) 320 mg IV  DESCRIPTION OF PROCEDURE:   After the risks and benefits and of the procedure were explained, informed consent was obtained.  A digital rectal exam revealed no abnormalities of the rectum.    The LB [id number]-[id number]  endoscope was introduced through the anus and advanced to the cecum, which was identified by both the appendix and ileocecal valve .  The quality of the prep was excellent, using MoviPrep .  The instrument was then slowly withdrawn as the colon was fully examined.     COLON FINDINGS: A normal appearing cecum, ileocecal valve, and appendiceal orifice were identified.  The ascending, hepatic flexure, transverse, splenic flexure, descending, sigmoid colon and rectum appeared unremarkable.  No polyps or cancers were seen. normal tretroflexion          The scope was then withdrawn from the patient and the procedure completed.  COMPLICATIONS: There were no complications. ENDOSCOPIC IMPRESSION: Normal colon  RECOMMENDATIONS: High fiber diet   REPEAT EXAM: In 5 year(s)  for Colonoscopy.  cc:  _______________________________ eSignedHart Carwin, MD 06/08/2012 11:22 AM

## 2012-06-09 ENCOUNTER — Telehealth: Payer: Self-pay | Admitting: *Deleted

## 2012-06-09 NOTE — Telephone Encounter (Signed)
  Follow up Call-  Call back number 06/08/2012  Post procedure Call Back phone  # [phone] cell  Permission to leave phone message Yes     Patient questions:  Do you have a fever, pain , or abdominal swelling? no Pain Score  0 *  Have you tolerated food without any problems? yes  Have you been able to return to your normal activities? yes  Do you have any questions about your discharge instructions: Diet   no Medications  no Follow up visit  no  Do you have questions or concerns about your Care? no  Actions: * If pain score is 4 or above: No action needed, pain <4.

## 2012-06-14 ENCOUNTER — Other Ambulatory Visit: Payer: Self-pay | Admitting: *Deleted

## 2012-06-14 ENCOUNTER — Encounter: Payer: Self-pay | Admitting: Internal Medicine

## 2012-06-14 DIAGNOSIS — K219 Gastro-esophageal reflux disease without esophagitis: Secondary | ICD-10-CM

## 2012-06-14 MED ORDER — ESOMEPRAZOLE MAGNESIUM 40 MG PO CPDR: 40.0000 mg | DELAYED_RELEASE_CAPSULE | Freq: Every day | ORAL | Status: DC

## 2012-06-14 NOTE — Telephone Encounter (Signed)
Refill for Nexium sent to [facility] no rf and [facility] with 3 refills

## 2012-06-25 ENCOUNTER — Encounter: Payer: Self-pay | Admitting: *Deleted

## 2012-06-25 ENCOUNTER — Ambulatory Visit (INDEPENDENT_AMBULATORY_CARE_PROVIDER_SITE_OTHER): Payer: BC Managed Care – PPO | Admitting: Family Medicine

## 2012-06-25 ENCOUNTER — Ambulatory Visit (HOSPITAL_COMMUNITY)
Admission: RE | Admit: 2012-06-25 | Discharge: 2012-06-25 | Disposition: A | Discharge status: Home / Self Care | Payer: BC Managed Care – PPO | Source: Ambulatory Visit | Attending: Family Medicine | Admitting: Family Medicine

## 2012-06-25 VITALS — BP 134/80 | HR 105 | Temp 98.1°F | Wt 253.8 lb

## 2012-06-25 DIAGNOSIS — K429 Umbilical hernia without obstruction or gangrene: Secondary | ICD-10-CM | POA: Insufficient documentation

## 2012-06-25 DIAGNOSIS — R1031 Right lower quadrant pain: Secondary | ICD-10-CM

## 2012-06-25 LAB — CBC WITH DIFFERENTIAL/PLATELET
Basophils Relative: 0.4 % (ref 0.0–3.0)
Eosinophils Absolute: 0.3 10*3/uL (ref 0.0–0.7)
HCT: 38.6 % (ref 36.0–46.0)
Lymphs Abs: 2.4 10*3/uL (ref 0.7–4.0)
MCHC: 33.7 g/dL (ref 30.0–36.0)
MCV: 89.1 fl (ref 78.0–100.0)
Monocytes Absolute: 0.9 10*3/uL (ref 0.1–1.0)
Neutrophils Relative %: 65.4 % (ref 43.0–77.0)
RBC: 4.33 Mil/uL (ref 3.87–5.11)

## 2012-06-25 LAB — HEPATIC FUNCTION PANEL
ALT: 20 U/L (ref 0–35)
Albumin: 3.4 g/dL — ABNORMAL LOW (ref 3.5–5.2)
Bilirubin, Direct: 0 mg/dL (ref 0.0–0.3)
Total Protein: 7.5 g/dL (ref 6.0–8.3)

## 2012-06-25 LAB — BASIC METABOLIC PANEL
BUN: 18 mg/dL (ref 6–23)
CO2: 28 mEq/L (ref 19–32)
Chloride: 102 mEq/L (ref 96–112)
Creatinine, Ser: 0.6 mg/dL (ref 0.4–1.2)
Potassium: 3.9 mEq/L (ref 3.5–5.1)

## 2012-06-25 LAB — AMYLASE: Amylase: 47 U/L (ref 27–131)

## 2012-06-25 LAB — LIPASE: Lipase: 13 U/L (ref 11.0–59.0)

## 2012-06-25 MED ORDER — IOHEXOL 300 MG/ML  SOLN
100.0000 mL | Freq: Once | INTRAMUSCULAR | Status: AC | PRN
  Administered 2012-06-25: 100 mL via INTRAVENOUS

## 2012-06-25 NOTE — Patient Instructions (Signed)
Abdominal Pain  Abdominal pain can be caused by many things. Your caregiver decides the seriousness of your pain by an examination and possibly blood tests and X-rays. Many cases can be observed and treated at home. Most abdominal pain is not caused by a disease and will probably improve without treatment. However, in many cases, more time must pass before a clear cause of the pain can be found. Before that point, it may not be known if you need more testing, or if hospitalization or surgery is needed.  HOME CARE INSTRUCTIONS   · Do not take laxatives unless directed by your caregiver.  · Take pain medicine only as directed by your caregiver.  · Only take over-the-counter or prescription medicines for pain, discomfort, or fever as directed by your caregiver.  · Try a clear liquid diet (broth, tea, or water) for as long as directed by your caregiver. Slowly move to a bland diet as tolerated.  SEEK IMMEDIATE MEDICAL CARE IF:   · The pain does not go away.  · You have a fever.  · You keep throwing up (vomiting).  · The pain is felt only in portions of the abdomen. Pain in the right side could possibly be appendicitis. In an adult, pain in the left lower portion of the abdomen could be colitis or diverticulitis.  · You pass bloody or black tarry stools.  MAKE SURE YOU:   · Understand these instructions.  · Will watch your condition.  · Will get help right away if you are not doing well or get worse.  Document Released: 02/12/2005 Document Revised: 07/28/2011 Document Reviewed: 12/22/2007  ExitCare® Patient Information ©2013 [employer].

## 2012-06-26 ENCOUNTER — Encounter: Payer: Self-pay | Admitting: Family Medicine

## 2012-06-26 NOTE — Progress Notes (Signed)
  Subjective:     Donna Lopez is a 47 y.o. female who presents for evaluation of abdominal pain. Onset was several days ago. Symptoms have been gradually worsening. The pain is described as sharp, and is 5/10 in intensity. Pain is located in the periumbilical region without radiation.  Aggravating factors: activity.  Alleviating factors: none. Associated symptoms: none. The patient denies anorexia, arthralagias, belching, chills, constipation, diarrhea, dysuria, fever, flatus, frequency, headache, hematochezia, hematuria, melena, myalgias, nausea, sweats and vomiting.  The patient's history has been marked as reviewed and updated as appropriate.  Review of Systems Pertinent items are noted in HPI.     Objective:    BP 134/80  Pulse 105  Temp(Src) 98.1 F (36.7 C) (Oral)  Wt 253 lb 12.8 oz (115.123 kg)  BMI 44.97 kg/m2  SpO2 95%  LMP 06/18/2012 General appearance: alert, cooperative, appears stated age, no distress and morbidly obese Abdomen: abnormal findings:  guarding and moderate tenderness in the periumbilical area and in the RLQ    Assessment:    Abdominal pain, likely secondary to ? Hernia vs AP .    Plan:    The diagnosis was discussed with the patient and evaluation and treatment plans outlined. See orders for lab and imaging studies. Further follow-up plans will be based on outcome of lab/imaging studies; see orders.

## 2012-08-11 ENCOUNTER — Ambulatory Visit: Payer: BC Managed Care – PPO | Admitting: Internal Medicine

## 2012-12-27 ENCOUNTER — Other Ambulatory Visit: Payer: Self-pay | Admitting: *Deleted

## 2012-12-27 DIAGNOSIS — R609 Edema, unspecified: Secondary | ICD-10-CM

## 2012-12-27 MED ORDER — HYDROCHLOROTHIAZIDE 25 MG PO TABS: 25.0000 mg | ORAL_TABLET | Freq: Every day | ORAL | Status: DC

## 2012-12-27 NOTE — Telephone Encounter (Signed)
Rx was filled for Hydrochlorothiazide 25 mg 12/27/12 AG cma

## 2012-12-29 ENCOUNTER — Other Ambulatory Visit: Payer: Self-pay | Admitting: *Deleted

## 2012-12-29 DIAGNOSIS — R609 Edema, unspecified: Secondary | ICD-10-CM

## 2012-12-29 MED ORDER — HYDROCHLOROTHIAZIDE 25 MG PO TABS: 25.0000 mg | ORAL_TABLET | Freq: Every day | ORAL | Status: DC

## 2013-01-20 ENCOUNTER — Telehealth: Payer: Self-pay | Admitting: General Practice

## 2013-01-20 DIAGNOSIS — K219 Gastro-esophageal reflux disease without esophagitis: Secondary | ICD-10-CM

## 2013-01-20 NOTE — Telephone Encounter (Signed)
Message left on triage line. Pt switched insurance companies so she needs a new Rx for Nexium to be sent to mail order. Called pt and LMOVM to verify which mail order she needs meds to be sent to.

## 2013-01-21 MED ORDER — ESOMEPRAZOLE MAGNESIUM 40 MG PO CPDR: 40.0000 mg | DELAYED_RELEASE_CAPSULE | Freq: Every day | ORAL | Status: DC

## 2013-01-21 NOTE — Telephone Encounter (Signed)
Patient says she now uses [facility] but but she has a form that she must submit to the company as well which is why she would like to come by and pick up her prescription. Please notify when ready.

## 2013-01-21 NOTE — Telephone Encounter (Signed)
Pt notified. Med filled. Front Desk told to schedule pt for an OV due to not being seen since Feb 2014.

## 2013-02-08 ENCOUNTER — Other Ambulatory Visit: Payer: Self-pay | Admitting: General Practice

## 2013-02-08 DIAGNOSIS — K219 Gastro-esophageal reflux disease without esophagitis: Secondary | ICD-10-CM

## 2013-02-08 MED ORDER — ESOMEPRAZOLE MAGNESIUM 40 MG PO CPDR: 40.0000 mg | DELAYED_RELEASE_CAPSULE | Freq: Every day | ORAL | Status: DC

## 2013-02-15 ENCOUNTER — Telehealth: Payer: Self-pay

## 2013-02-15 MED ORDER — PANTOPRAZOLE SODIUM 40 MG PO TBEC: 40.0000 mg | DELAYED_RELEASE_TABLET | Freq: Every day | ORAL | Status: DC

## 2013-02-15 NOTE — Telephone Encounter (Signed)
Rx sent      KP 

## 2013-02-15 NOTE — Telephone Encounter (Signed)
protonix 1 po qd #90  3 refills 

## 2013-02-15 NOTE — Telephone Encounter (Signed)
PA initiated and has been denied. PA # [id number]. Patient would have had to tried and failed either Omeprazole or Pantoprazole. Please advise on alternative    KP

## 2013-07-06 ENCOUNTER — Other Ambulatory Visit: Payer: Self-pay | Admitting: Family Medicine

## 2013-07-19 ENCOUNTER — Ambulatory Visit (INDEPENDENT_AMBULATORY_CARE_PROVIDER_SITE_OTHER): Payer: BC Managed Care – PPO | Admitting: Family Medicine

## 2013-07-19 ENCOUNTER — Encounter: Payer: Self-pay | Admitting: Family Medicine

## 2013-07-19 VITALS — BP 130/92 | HR 92 | Temp 98.2°F | Resp 16 | Wt 259.1 lb

## 2013-07-19 DIAGNOSIS — M546 Pain in thoracic spine: Secondary | ICD-10-CM | POA: Insufficient documentation

## 2013-07-19 MED ORDER — NAPROXEN 500 MG PO TABS: 500.0000 mg | ORAL_TABLET | Freq: Two times a day (BID) | ORAL | Status: AC

## 2013-07-19 MED ORDER — CYCLOBENZAPRINE HCL 10 MG PO TABS: 10.0000 mg | ORAL_TABLET | Freq: Three times a day (TID) | ORAL | Status: DC | PRN

## 2013-07-19 NOTE — Assessment & Plan Note (Signed)
New.  Pt's sxs and PE suggestive of muscular origin of pain rather than joint pain or bony.  No TTP over spine.  Start scheduled NSAIDs, flexeril, heat prn.  Reviewed supportive care and red flags that should prompt return.  Pt expressed understanding and is in agreement w/ plan.

## 2013-07-19 NOTE — Progress Notes (Signed)
   Subjective:    Patient ID: Donna Lopez, female    DOB: 31-Jan-1966, 48 y.o.   MRN: [id number]  HPI Back pain- sxs started last week and initially was only w/ rising from seated position.  Now pain is more constant.  Pain is localized to L lower back.  No radiation of pain.  No known injury but has done some lifting.  Taking Aleve and Meloxicam w/o relief.  Pt feels it's more muscular than joint or bone.   Review of Systems For ROS see HPI     Objective:   Physical Exam  Vitals reviewed. Constitutional: She is oriented to person, place, and time. She appears well-developed and well-nourished. No distress.  overweight  Musculoskeletal:  + TTP over L lat Pain w/ rising from a seated position Pain w/ extension>flexion  Neurological: She is alert and oriented to person, place, and time. She has normal reflexes. No cranial nerve deficit. Coordination normal.  (-) SLR bilaterally          Assessment & Plan:

## 2013-07-19 NOTE — Progress Notes (Signed)
Pre visit review using our clinic review tool, if applicable. No additional management support is needed unless otherwise documented below in the visit note. 

## 2013-07-19 NOTE — Patient Instructions (Signed)
Follow up as needed Start the Flexeril as needed for spasm- may cause drowsiness Take the Naproxen twice daily- w/ food- for 7-10 days and then as needed HEAT! Gentle stretching to avoid stiffness Call with any questions or concerns Hang in there!

## 2013-10-01 ENCOUNTER — Other Ambulatory Visit: Payer: Self-pay | Admitting: Family Medicine

## 2014-01-12 ENCOUNTER — Other Ambulatory Visit: Payer: Self-pay | Admitting: Family Medicine

## 2014-04-04 ENCOUNTER — Other Ambulatory Visit: Payer: Self-pay | Admitting: Family Medicine

## 2014-04-20 ENCOUNTER — Telehealth: Payer: Self-pay

## 2014-04-20 MED ORDER — ESOMEPRAZOLE MAGNESIUM 40 MG PO CPDR: DELAYED_RELEASE_CAPSULE | ORAL | Status: DC

## 2014-04-20 NOTE — Telephone Encounter (Signed)
Rx faxed.    KP 

## 2014-04-20 NOTE — Telephone Encounter (Signed)
Donna Lopez [phone] [facility]  Donna Lopez called made a CPE in late January, she needs a refill on her  esomeprazole (NEXIUM) 40 MG capsule

## 2014-06-13 ENCOUNTER — Ambulatory Visit (INDEPENDENT_AMBULATORY_CARE_PROVIDER_SITE_OTHER): Payer: BLUE CROSS/BLUE SHIELD | Admitting: Family Medicine

## 2014-06-13 ENCOUNTER — Encounter: Payer: Self-pay | Admitting: Family Medicine

## 2014-06-13 VITALS — BP 120/76 | HR 90 | Temp 98.3°F | Wt 235.0 lb

## 2014-06-13 DIAGNOSIS — M069 Rheumatoid arthritis, unspecified: Secondary | ICD-10-CM

## 2014-06-13 DIAGNOSIS — Z23 Encounter for immunization: Secondary | ICD-10-CM

## 2014-06-13 DIAGNOSIS — K219 Gastro-esophageal reflux disease without esophagitis: Secondary | ICD-10-CM

## 2014-06-13 DIAGNOSIS — Z0000 Encounter for general adult medical examination without abnormal findings: Secondary | ICD-10-CM

## 2014-06-13 NOTE — Progress Notes (Signed)
Pre visit review using our clinic review tool, if applicable. No additional management support is needed unless otherwise documented below in the visit note. 

## 2014-06-13 NOTE — Progress Notes (Signed)
Subjective:     Donna Lopez is a 49 y.o. female and is here for a comprehensive physical exam. The patient reports no problems.  History   Social History  . Marital Status: Married    Spouse Name: N/A    Number of Children: N/A  . Years of Education: N/A   Occupational History  .  [employer]   Social History Main Topics  . Smoking status: Never Smoker   . Smokeless tobacco: Never Used  . Alcohol Use: No     Comment: rare alcohol use-once/twice a year  . Drug Use: No  . Sexual Activity:    Partners: Male   Other Topics Concern  . Not on file   Social History Narrative   Walks 3x a week   Health Maintenance  Topic Date Due  . Janet Berlin  09/30/2011  . INFLUENZA VACCINE  12/17/2013  . MAMMOGRAM  12/12/2014 (Originally 12/17/2011)  . PAP SMEAR  06/14/2015 (Originally 04/23/2014)  . 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 She  does not have any pertinent problems on file. She  has no past surgical history on file. Her family history includes Colon cancer in her paternal grandfather; Colon cancer (age of onset: 40) in her sister; Diabetes in her father; Kidney cancer in her father; Rectal cancer in her sister; Uterine cancer in her mother. There is no history of Esophageal cancer or Stomach cancer. She  reports that she has never smoked. She has never used smokeless tobacco. She reports that she does not drink alcohol or use illicit drugs. She has a current medication list which includes the following prescription(s): adalimumab, aspirin, calcium + d3, doxycycline, esomeprazole, folic acid, hydrochlorothiazide, l-lysine, methotrexate, misc natural products, and naproxen. Current Outpatient Prescriptions on File Prior to Visit  Medication Sig Dispense Refill  .  adalimumab (HUMIRA) 40 MG/0.8ML injection Inject 40 mg into the skin every 14 (fourteen) days.      Marland Kitchen aspirin 81 MG tablet Take 81 mg by mouth daily.    . Calcium Carb-Cholecalciferol (CALCIUM + D3) 600-200 MG-UNIT TABS Take 1 capsule by mouth daily.    Marland Kitchen doxycycline (VIBRAMYCIN) 50 MG capsule Take 50 mg by mouth daily.    Marland Kitchen esomeprazole (NEXIUM) 40 MG capsule TAKE 1 CAPSULE DAILY BEFORE BREAKFAST ( OFFICE VISIT DUE NOW) 90 capsule 0  . folic acid (FOLVITE) 1 MG tablet     . hydrochlorothiazide (HYDRODIURIL) 25 MG tablet Take 1 tablet (25 mg total) by mouth daily. 30 tablet 5  . L-Lysine 500 MG CAPS Take 1 capsule by mouth daily.    . methotrexate (RHEUMATREX) 2.5 MG tablet Take 20 mg by mouth once a week. Caution:Chemotherapy. Protect from light.    . Misc Natural Products (OSTEO BI-FLEX JOINT SHIELD PO) Take 2 tablets by mouth daily.    . naproxen (NAPROSYN) 500 MG tablet Take 1 tablet (500 mg total) by mouth 2 (two) times daily with a meal. 60 tablet 0   No current facility-administered medications on file prior to visit.   She is allergic to penicillins..  Review of Systems Review of Systems  Constitutional: Negative for activity change, appetite change and fatigue.  HENT: Negative for hearing loss, congestion, tinnitus and ear discharge.  dentist q95m Eyes: Negative for visual disturbance (see optho q1y -- vision corrected to  20/20 with glasses).  Respiratory: Negative for cough, chest tightness and shortness of breath.   Cardiovascular: Negative for chest pain, palpitations and leg swelling.  Gastrointestinal: Negative for abdominal pain, diarrhea, constipation and abdominal distention.  Genitourinary: Negative for urgency, frequency, decreased urine volume and difficulty urinating.  Musculoskeletal: Negative for back pain, arthralgias and gait problem.  Skin: Negative for color change, pallor and rash.  Neurological: Negative for dizziness, light-headedness, numbness and headaches.   Hematological: Negative for adenopathy. Does not bruise/bleed easily.  Psychiatric/Behavioral: Negative for suicidal ideas, confusion, sleep disturbance, self-injury, dysphoric mood, decreased concentration and agitation.       Objective:    BP 120/76 mmHg  Pulse 90  Temp(Src) 98.3 F (36.8 C) (Oral)  Wt 235 lb (106.595 kg)  SpO2 97% General appearance: alert, cooperative, appears stated age and no distress Head: Normocephalic, without obvious abnormality, atraumatic Eyes: conjunctivae/corneas clear. PERRL, EOM's intact. Fundi benign. Ears: normal TM's and external ear canals both ears Nose: Nares normal. Septum midline. Mucosa normal. No drainage or sinus tenderness. Throat: lips, mucosa, and tongue normal; teeth and gums normal Neck: no adenopathy, no carotid bruit, no JVD, supple, symmetrical, trachea midline and thyroid not enlarged, symmetric, no tenderness/mass/nodules Back: symmetric, no curvature. ROM normal. No CVA tenderness. Lungs: clear to auscultation bilaterally Breasts: normal appearance, no masses or tenderness Heart: S1, S2 normal--murmur Abdomen: soft, non-tender; bowel sounds normal; no masses,  no organomegaly Pelvic: deferred Extremities: extremities normal, atraumatic, no cyanosis or edema Pulses: 2+ and symmetric Skin: Skin color, texture, turgor normal. No rashes or lesions Lymph nodes: Cervical, supraclavicular, and axillary nodes normal. Neurologic: Alert and oriented X 3, normal strength and tone. Normal symmetric reflexes. Normal coordination and gait Psych--  No anxiety, no depression      Assessment:    Healthy female exam.      Plan:    ghm utd Check labs See After Visit Summary for Counseling Recommendations   1. Severe obesity (BMI >= 40)   2. RA (rheumatoid arthritis) Per rheum  3. Gastroesophageal reflux disease without esophagitis con't nexium   4. Preventative health care   - Basic metabolic panel; Future - CBC with  Differential/Platelet; Future - Hepatic function panel; Future - Lipid panel; Future - POCT urinalysis dipstick; Future - TSH; Future

## 2014-06-13 NOTE — Patient Instructions (Signed)
Preventive Care for Adults A healthy lifestyle and preventive care can promote health and wellness. Preventive health guidelines for women include the following key practices.  A routine yearly physical is a good way to check with your health care provider about your health and preventive screening. It is a chance to share any concerns and updates on your health and to receive a thorough exam.  Visit your dentist for a routine exam and preventive care every 6 months. Brush your teeth twice a day and floss once a day. Good oral hygiene prevents tooth decay and gum disease.  The frequency of eye exams is based on your age, health, family medical history, use of contact lenses, and other factors. Follow your health care provider's recommendations for frequency of eye exams.  Eat a healthy diet. Foods like vegetables, fruits, whole grains, low-fat dairy products, and lean protein foods contain the nutrients you need without too many calories. Decrease your intake of foods high in solid fats, added sugars, and salt. Eat the right amount of calories for you.Get information about a proper diet from your health care provider, if necessary.  Regular physical exercise is one of the most important things you can do for your health. Most adults should get at least 150 minutes of moderate-intensity exercise (any activity that increases your heart rate and causes you to sweat) each week. In addition, most adults need muscle-strengthening exercises on 2 or more days a week.  Maintain a healthy weight. The body mass index (BMI) is a screening tool to identify possible weight problems. It provides an estimate of body fat based on height and weight. Your health care provider can find your BMI and can help you achieve or maintain a healthy weight.For adults 20 years and older:  A BMI below 18.5 is considered underweight.  A BMI of 18.5 to 24.9 is normal.  A BMI of 25 to 29.9 is considered overweight.  A BMI of  30 and above is considered obese.  Maintain normal blood lipids and cholesterol levels by exercising and minimizing your intake of saturated fat. Eat a balanced diet with plenty of fruit and vegetables. Blood tests for lipids and cholesterol should begin at age 50 and be repeated every 5 years. If your lipid or cholesterol levels are high, you are over 50, or you are at high risk for heart disease, you may need your cholesterol levels checked more frequently.Ongoing high lipid and cholesterol levels should be treated with medicines if diet and exercise are not working.  If you smoke, find out from your health care provider how to quit. If you do not use tobacco, do not start.  Lung cancer screening is recommended for adults aged 22-80 years who are at high risk for developing lung cancer because of a history of smoking. A yearly low-dose CT scan of the lungs is recommended for people who have at least a 30-pack-year history of smoking and are a current smoker or have quit within the past 15 years. A pack year of smoking is smoking an average of 1 pack of cigarettes a day for 1 year (for example: 1 pack a day for 30 years or 2 packs a day for 15 years). Yearly screening should continue until the smoker has stopped smoking for at least 15 years. Yearly screening should be stopped for people who develop a health problem that would prevent them from having lung cancer treatment.  If you are pregnant, do not drink alcohol. If you are breastfeeding,  be very cautious about drinking alcohol. If you are not pregnant and choose to drink alcohol, do not have more than 1 drink per day. One drink is considered to be 12 ounces (355 mL) of beer, 5 ounces (148 mL) of wine, or 1.5 ounces (44 mL) of liquor.  Avoid use of street drugs. Do not share needles with anyone. Ask for help if you need support or instructions about stopping the use of drugs.  High blood pressure causes heart disease and increases the risk of  stroke. Your blood pressure should be checked at least every 1 to 2 years. Ongoing high blood pressure should be treated with medicines if weight loss and exercise do not work.  If you are 50-50 years old, ask your health care provider if you should take aspirin to prevent strokes.  Diabetes screening involves taking a blood sample to check your fasting blood sugar level. This should be done once every 3 years, after age 15, if you are within normal weight and without risk factors for diabetes. Testing should be considered at a younger age or be carried out more frequently if you are overweight and have at least 1 risk factor for diabetes.  Breast cancer screening is essential preventive care for women. You should practice "breast self-awareness." This means understanding the normal appearance and feel of your breasts and may include breast self-examination. Any changes detected, no matter how small, should be reported to a health care provider. Women in their 50s and 50s should have a clinical breast exam (CBE) by a health care provider as part of a regular health exam every 1 to 3 years. After age 16, women should have a CBE every year. Starting at age 50, women should consider having a mammogram (breast X-ray test) every year. Women who have a family history of breast cancer should talk to their health care provider about genetic screening. Women at a high risk of breast cancer should talk to their health care providers about having an MRI and a mammogram every year.  Breast cancer gene (BRCA)-related cancer risk assessment is recommended for women who have family members with BRCA-related cancers. BRCA-related cancers include breast, ovarian, tubal, and peritoneal cancers. Having family members with these cancers may be associated with an increased risk for harmful changes (mutations) in the breast cancer genes BRCA1 and BRCA2. Results of the assessment will determine the need for genetic counseling and  BRCA1 and BRCA2 testing.  Routine pelvic exams to screen for cancer are no longer recommended for nonpregnant women who are considered low risk for cancer of the pelvic organs (ovaries, uterus, and vagina) and who do not have symptoms. Ask your health care provider if a screening pelvic exam is right for you.  If you have had past treatment for cervical cancer or a condition that could lead to cancer, you need Pap tests and screening for cancer for at least 20 years after your treatment. If Pap tests have been discontinued, your risk factors (such as having a new sexual partner) need to be reassessed to determine if screening should be resumed. Some women have medical problems that increase the chance of getting cervical cancer. In these cases, your health care provider may recommend more frequent screening and Pap tests.  The HPV test is an additional test that may be used for cervical cancer screening. The HPV test looks for the virus that can cause the cell changes on the cervix. The cells collected during the Pap test can be  tested for HPV. The HPV test could be used to screen women aged 30 years and older, and should be used in women of any age who have unclear Pap test results. After the age of 30, women should have HPV testing at the same frequency as a Pap test.  Colorectal cancer can be detected and often prevented. Most routine colorectal cancer screening begins at the age of 50 years and continues through age 75 years. However, your health care provider may recommend screening at an earlier age if you have risk factors for colon cancer. On a yearly basis, your health care provider may provide home test kits to check for hidden blood in the stool. Use of a small camera at the end of a tube, to directly examine the colon (sigmoidoscopy or colonoscopy), can detect the earliest forms of colorectal cancer. Talk to your health care provider about this at age 50, when routine screening begins. Direct  exam of the colon should be repeated every 5-10 years through age 75 years, unless early forms of pre-cancerous polyps or small growths are found.  People who are at an increased risk for hepatitis B should be screened for this virus. You are considered at high risk for hepatitis B if:  You were born in a country where hepatitis B occurs often. Talk with your health care provider about which countries are considered high risk.  Your parents were born in a high-risk country and you have not received a shot to protect against hepatitis B (hepatitis B vaccine).  You have HIV or AIDS.  You use needles to inject street drugs.  You live with, or have sex with, someone who has hepatitis B.  You get hemodialysis treatment.  You take certain medicines for conditions like cancer, organ transplantation, and autoimmune conditions.  Hepatitis C blood testing is recommended for all people born from 1945 through 1965 and any individual with known risks for hepatitis C.  Practice safe sex. Use condoms and avoid high-risk sexual practices to reduce the spread of sexually transmitted infections (STIs). STIs include gonorrhea, chlamydia, syphilis, trichomonas, herpes, HPV, and human immunodeficiency virus (HIV). Herpes, HIV, and HPV are viral illnesses that have no cure. They can result in disability, cancer, and death.  You should be screened for sexually transmitted illnesses (STIs) including gonorrhea and chlamydia if:  You are sexually active and are younger than 24 years.  You are older than 24 years and your health care provider tells you that you are at risk for this type of infection.  Your sexual activity has changed since you were last screened and you are at an increased risk for chlamydia or gonorrhea. Ask your health care provider if you are at risk.  If you are at risk of being infected with HIV, it is recommended that you take a prescription medicine daily to prevent HIV infection. This is  called preexposure prophylaxis (PrEP). You are considered at risk if:  You are a heterosexual woman, are sexually active, and are at increased risk for HIV infection.  You take drugs by injection.  You are sexually active with a partner who has HIV.  Talk with your health care provider about whether you are at high risk of being infected with HIV. If you choose to begin PrEP, you should first be tested for HIV. You should then be tested every 3 months for as long as you are taking PrEP.  Osteoporosis is a disease in which the bones lose minerals and strength   with aging. This can result in serious bone fractures or breaks. The risk of osteoporosis can be identified using a bone density scan. Women ages 65 years and over and women at risk for fractures or osteoporosis should discuss screening with their health care providers. Ask your health care provider whether you should take a calcium supplement or vitamin D to reduce the rate of osteoporosis.  Menopause can be associated with physical symptoms and risks. Hormone replacement therapy is available to decrease symptoms and risks. You should talk to your health care provider about whether hormone replacement therapy is right for you.  Use sunscreen. Apply sunscreen liberally and repeatedly throughout the day. You should seek shade when your shadow is shorter than you. Protect yourself by wearing long sleeves, pants, a wide-brimmed hat, and sunglasses year round, whenever you are outdoors.  Once a month, do a whole body skin exam, using a mirror to look at the skin on your back. Tell your health care provider of new moles, moles that have irregular borders, moles that are larger than a pencil eraser, or moles that have changed in shape or color.  Stay current with required vaccines (immunizations).  Influenza vaccine. All adults should be immunized every year.  Tetanus, diphtheria, and acellular pertussis (Td, Tdap) vaccine. Pregnant women should  receive 1 dose of Tdap vaccine during each pregnancy. The dose should be obtained regardless of the length of time since the last dose. Immunization is preferred during the 27th-36th week of gestation. An adult who has not previously received Tdap or who does not know her vaccine status should receive 1 dose of Tdap. This initial dose should be followed by tetanus and diphtheria toxoids (Td) booster doses every 10 years. Adults with an unknown or incomplete history of completing a 3-dose immunization series with Td-containing vaccines should begin or complete a primary immunization series including a Tdap dose. Adults should receive a Td booster every 10 years.  Varicella vaccine. An adult without evidence of immunity to varicella should receive 2 doses or a second dose if she has previously received 1 dose. Pregnant females who do not have evidence of immunity should receive the first dose after pregnancy. This first dose should be obtained before leaving the health care facility. The second dose should be obtained 4-8 weeks after the first dose.  Human papillomavirus (HPV) vaccine. Females aged 13-26 years who have not received the vaccine previously should obtain the 3-dose series. The vaccine is not recommended for use in pregnant females. However, pregnancy testing is not needed before receiving a dose. If a female is found to be pregnant after receiving a dose, no treatment is needed. In that case, the remaining doses should be delayed until after the pregnancy. Immunization is recommended for any person with an immunocompromised condition through the age of 26 years if she did not get any or all doses earlier. During the 3-dose series, the second dose should be obtained 4-8 weeks after the first dose. The third dose should be obtained 24 weeks after the first dose and 16 weeks after the second dose.  Zoster vaccine. One dose is recommended for adults aged 60 years or older unless certain conditions are  present.  Measles, mumps, and rubella (MMR) vaccine. Adults born before 1957 generally are considered immune to measles and mumps. Adults born in 1957 or later should have 1 or more doses of MMR vaccine unless there is a contraindication to the vaccine or there is laboratory evidence of immunity to   each of the three diseases. A routine second dose of MMR vaccine should be obtained at least 28 days after the first dose for students attending postsecondary schools, health care workers, or international travelers. People who received inactivated measles vaccine or an unknown type of measles vaccine during 1963-1967 should receive 2 doses of MMR vaccine. People who received inactivated mumps vaccine or an unknown type of mumps vaccine before 1979 and are at high risk for mumps infection should consider immunization with 2 doses of MMR vaccine. For females of childbearing age, rubella immunity should be determined. If there is no evidence of immunity, females who are not pregnant should be vaccinated. If there is no evidence of immunity, females who are pregnant should delay immunization until after pregnancy. Unvaccinated health care workers born before 1957 who lack laboratory evidence of measles, mumps, or rubella immunity or laboratory confirmation of disease should consider measles and mumps immunization with 2 doses of MMR vaccine or rubella immunization with 1 dose of MMR vaccine.  Pneumococcal 13-valent conjugate (PCV13) vaccine. When indicated, a person who is uncertain of her immunization history and has no record of immunization should receive the PCV13 vaccine. An adult aged 19 years or older who has certain medical conditions and has not been previously immunized should receive 1 dose of PCV13 vaccine. This PCV13 should be followed with a dose of pneumococcal polysaccharide (PPSV23) vaccine. The PPSV23 vaccine dose should be obtained at least 8 weeks after the dose of PCV13 vaccine. An adult aged 19  years or older who has certain medical conditions and previously received 1 or more doses of PPSV23 vaccine should receive 1 dose of PCV13. The PCV13 vaccine dose should be obtained 1 or more years after the last PPSV23 vaccine dose.  Pneumococcal polysaccharide (PPSV23) vaccine. When PCV13 is also indicated, PCV13 should be obtained first. All adults aged 65 years and older should be immunized. An adult younger than age 65 years who has certain medical conditions should be immunized. Any person who resides in a nursing home or long-term care facility should be immunized. An adult smoker should be immunized. People with an immunocompromised condition and certain other conditions should receive both PCV13 and PPSV23 vaccines. People with human immunodeficiency virus (HIV) infection should be immunized as soon as possible after diagnosis. Immunization during chemotherapy or radiation therapy should be avoided. Routine use of PPSV23 vaccine is not recommended for American Indians, [place] Natives, or people younger than 65 years unless there are medical conditions that require PPSV23 vaccine. When indicated, people who have unknown immunization and have no record of immunization should receive PPSV23 vaccine. One-time revaccination 5 years after the first dose of PPSV23 is recommended for people aged 19-64 years who have chronic kidney failure, nephrotic syndrome, asplenia, or immunocompromised conditions. People who received 1-2 doses of PPSV23 before age 65 years should receive another dose of PPSV23 vaccine at age 65 years or later if at least 5 years have passed since the previous dose. Doses of PPSV23 are not needed for people immunized with PPSV23 at or after age 65 years.  Meningococcal vaccine. Adults with asplenia or persistent complement component deficiencies should receive 2 doses of quadrivalent meningococcal conjugate (MenACWY-D) vaccine. The doses should be obtained at least 2 months apart.  Microbiologists working with certain meningococcal bacteria, military recruits, people at risk during an outbreak, and people who travel to or live in countries with a high rate of meningitis should be immunized. A first-year college student up through age   21 years who is living in a residence hall should receive a dose if she did not receive a dose on or after her 16th birthday. Adults who have certain high-risk conditions should receive one or more doses of vaccine.  Hepatitis A vaccine. Adults who wish to be protected from this disease, have certain high-risk conditions, work with hepatitis A-infected animals, work in hepatitis A research labs, or travel to or work in countries with a high rate of hepatitis A should be immunized. Adults who were previously unvaccinated and who anticipate close contact with an international adoptee during the first 60 days after arrival in the [place] from a country with a high rate of hepatitis A should be immunized.  Hepatitis B vaccine. Adults who wish to be protected from this disease, have certain high-risk conditions, may be exposed to blood or other infectious body fluids, are household contacts or sex partners of hepatitis B positive people, are clients or workers in certain care facilities, or travel to or work in countries with a high rate of hepatitis B should be immunized.  Haemophilus influenzae type b (Hib) vaccine. A previously unvaccinated person with asplenia or sickle cell disease or having a scheduled splenectomy should receive 1 dose of Hib vaccine. Regardless of previous immunization, a recipient of a hematopoietic stem cell transplant should receive a 3-dose series 6-12 months after her successful transplant. Hib vaccine is not recommended for adults with HIV infection. Preventive Services / Frequency Ages 64 to 68 years  Blood pressure check.** / Every 1 to 2 years.  Lipid and cholesterol check.** / Every 5 years beginning at age  22.  Clinical breast exam.** / Every 3 years for women in their 88s and 53s.  BRCA-related cancer risk assessment.** / For women who have family members with a BRCA-related cancer (breast, ovarian, tubal, or peritoneal cancers).  Pap test.** / Every 2 years from ages 90 through 51. Every 3 years starting at age 21 through age 56 or 3 with a history of 3 consecutive normal Pap tests.  HPV screening.** / Every 3 years from ages 24 through ages 1 to 46 with a history of 3 consecutive normal Pap tests.  Hepatitis C blood test.** / For any individual with known risks for hepatitis C.  Skin self-exam. / Monthly.  Influenza vaccine. / Every year.  Tetanus, diphtheria, and acellular pertussis (Tdap, Td) vaccine.** / Consult your health care provider. Pregnant women should receive 1 dose of Tdap vaccine during each pregnancy. 1 dose of Td every 10 years.  Varicella vaccine.** / Consult your health care provider. Pregnant females who do not have evidence of immunity should receive the first dose after pregnancy.  HPV vaccine. / 3 doses over 6 months, if 72 and younger. The vaccine is not recommended for use in pregnant females. However, pregnancy testing is not needed before receiving a dose.  Measles, mumps, rubella (MMR) vaccine.** / You need at least 1 dose of MMR if you were born in 1957 or later. You may also need a 2nd dose. For females of childbearing age, rubella immunity should be determined. If there is no evidence of immunity, females who are not pregnant should be vaccinated. If there is no evidence of immunity, females who are pregnant should delay immunization until after pregnancy.  Pneumococcal 13-valent conjugate (PCV13) vaccine.** / Consult your health care provider.  Pneumococcal polysaccharide (PPSV23) vaccine.** / 1 to 2 doses if you smoke cigarettes or if you have certain conditions.  Meningococcal vaccine.** /  1 dose if you are age 19 to 21 years and a first-year college  student living in a residence hall, or have one of several medical conditions, you need to get vaccinated against meningococcal disease. You may also need additional booster doses.  Hepatitis A vaccine.** / Consult your health care provider.  Hepatitis B vaccine.** / Consult your health care provider.  Haemophilus influenzae type b (Hib) vaccine.** / Consult your health care provider. Ages 40 to 64 years  Blood pressure check.** / Every 1 to 2 years.  Lipid and cholesterol check.** / Every 5 years beginning at age 20 years.  Lung cancer screening. / Every year if you are aged 55-80 years and have a 30-pack-year history of smoking and currently smoke or have quit within the past 15 years. Yearly screening is stopped once you have quit smoking for at least 15 years or develop a health problem that would prevent you from having lung cancer treatment.  Clinical breast exam.** / Every year after age 40 years.  BRCA-related cancer risk assessment.** / For women who have family members with a BRCA-related cancer (breast, ovarian, tubal, or peritoneal cancers).  Mammogram.** / Every year beginning at age 40 years and continuing for as long as you are in good health. Consult with your health care provider.  Pap test.** / Every 3 years starting at age 30 years through age 65 or 70 years with a history of 3 consecutive normal Pap tests.  HPV screening.** / Every 3 years from ages 30 years through ages 65 to 70 years with a history of 3 consecutive normal Pap tests.  Fecal occult blood test (FOBT) of stool. / Every year beginning at age 50 years and continuing until age 75 years. You may not need to do this test if you get a colonoscopy every 10 years.  Flexible sigmoidoscopy or colonoscopy.** / Every 5 years for a flexible sigmoidoscopy or every 10 years for a colonoscopy beginning at age 50 years and continuing until age 75 years.  Hepatitis C blood test.** / For all people born from 1945 through  1965 and any individual with known risks for hepatitis C.  Skin self-exam. / Monthly.  Influenza vaccine. / Every year.  Tetanus, diphtheria, and acellular pertussis (Tdap/Td) vaccine.** / Consult your health care provider. Pregnant women should receive 1 dose of Tdap vaccine during each pregnancy. 1 dose of Td every 10 years.  Varicella vaccine.** / Consult your health care provider. Pregnant females who do not have evidence of immunity should receive the first dose after pregnancy.  Zoster vaccine.** / 1 dose for adults aged 60 years or older.  Measles, mumps, rubella (MMR) vaccine.** / You need at least 1 dose of MMR if you were born in 1957 or later. You may also need a 2nd dose. For females of childbearing age, rubella immunity should be determined. If there is no evidence of immunity, females who are not pregnant should be vaccinated. If there is no evidence of immunity, females who are pregnant should delay immunization until after pregnancy.  Pneumococcal 13-valent conjugate (PCV13) vaccine.** / Consult your health care provider.  Pneumococcal polysaccharide (PPSV23) vaccine.** / 1 to 2 doses if you smoke cigarettes or if you have certain conditions.  Meningococcal vaccine.** / Consult your health care provider.  Hepatitis A vaccine.** / Consult your health care provider.  Hepatitis B vaccine.** / Consult your health care provider.  Haemophilus influenzae type b (Hib) vaccine.** / Consult your health care provider. Ages 65   years and over  Blood pressure check.** / Every 1 to 2 years.  Lipid and cholesterol check.** / Every 5 years beginning at age 22 years.  Lung cancer screening. / Every year if you are aged 73-80 years and have a 30-pack-year history of smoking and currently smoke or have quit within the past 15 years. Yearly screening is stopped once you have quit smoking for at least 15 years or develop a health problem that would prevent you from having lung cancer  treatment.  Clinical breast exam.** / Every year after age 4 years.  BRCA-related cancer risk assessment.** / For women who have family members with a BRCA-related cancer (breast, ovarian, tubal, or peritoneal cancers).  Mammogram.** / Every year beginning at age 40 years and continuing for as long as you are in good health. Consult with your health care provider.  Pap test.** / Every 3 years starting at age 9 years through age 34 or 91 years with 3 consecutive normal Pap tests. Testing can be stopped between 65 and 70 years with 3 consecutive normal Pap tests and no abnormal Pap or HPV tests in the past 10 years.  HPV screening.** / Every 3 years from ages 57 years through ages 64 or 45 years with a history of 3 consecutive normal Pap tests. Testing can be stopped between 65 and 70 years with 3 consecutive normal Pap tests and no abnormal Pap or HPV tests in the past 10 years.  Fecal occult blood test (FOBT) of stool. / Every year beginning at age 15 years and continuing until age 17 years. You may not need to do this test if you get a colonoscopy every 10 years.  Flexible sigmoidoscopy or colonoscopy.** / Every 5 years for a flexible sigmoidoscopy or every 10 years for a colonoscopy beginning at age 86 years and continuing until age 71 years.  Hepatitis C blood test.** / For all people born from [birth year] through 1966 and any individual with known risks for hepatitis C.  Osteoporosis screening.** / A one-time screening for women ages 83 years and over and women at risk for fractures or osteoporosis.  Skin self-exam. / Monthly.  Influenza vaccine. / Every year.  Tetanus, diphtheria, and acellular pertussis (Tdap/Td) vaccine.** / 1 dose of Td every 10 years.  Varicella vaccine.** / Consult your health care provider.  Zoster vaccine.** / 1 dose for adults aged 61 years or older.  Pneumococcal 13-valent conjugate (PCV13) vaccine.** / Consult your health care provider.  Pneumococcal  polysaccharide (PPSV23) vaccine.** / 1 dose for all adults aged 28 years and older.  Meningococcal vaccine.** / Consult your health care provider.  Hepatitis A vaccine.** / Consult your health care provider.  Hepatitis B vaccine.** / Consult your health care provider.  Haemophilus influenzae type b (Hib) vaccine.** / Consult your health care provider. ** Family history and personal history of risk and conditions may change your health care provider's recommendations. Document Released: 07/01/2001 Document Revised: 09/19/2013 Document Reviewed: 09/30/2010 [facility] Patient Information 2015 [place], [place]. This information is not intended to replace advice given to you by your health care provider. Make sure you discuss any questions you have with your health care provider.

## 2014-06-16 ENCOUNTER — Other Ambulatory Visit (INDEPENDENT_AMBULATORY_CARE_PROVIDER_SITE_OTHER): Payer: BLUE CROSS/BLUE SHIELD

## 2014-06-16 DIAGNOSIS — Z0000 Encounter for general adult medical examination without abnormal findings: Secondary | ICD-10-CM

## 2014-06-16 LAB — HEPATIC FUNCTION PANEL
ALBUMIN: 3.8 g/dL (ref 3.5–5.2)
ALK PHOS: 91 U/L (ref 39–117)
ALT: 14 U/L (ref 0–35)
AST: 14 U/L (ref 0–37)
BILIRUBIN DIRECT: 0 mg/dL (ref 0.0–0.3)
BILIRUBIN TOTAL: 0.4 mg/dL (ref 0.2–1.2)
Total Protein: 7.6 g/dL (ref 6.0–8.3)

## 2014-06-16 LAB — CBC WITH DIFFERENTIAL/PLATELET
BASOS PCT: 0.3 % (ref 0.0–3.0)
Basophils Absolute: 0 10*3/uL (ref 0.0–0.1)
Eosinophils Absolute: 0.2 10*3/uL (ref 0.0–0.7)
Eosinophils Relative: 1.5 % (ref 0.0–5.0)
HCT: 38.2 % (ref 36.0–46.0)
Hemoglobin: 13 g/dL (ref 12.0–15.0)
LYMPHS ABS: 2.1 10*3/uL (ref 0.7–4.0)
Lymphocytes Relative: 19.5 % (ref 12.0–46.0)
MCHC: 34.1 g/dL (ref 30.0–36.0)
MCV: 87.7 fl (ref 78.0–100.0)
MONOS PCT: 9 % (ref 3.0–12.0)
Monocytes Absolute: 1 10*3/uL (ref 0.1–1.0)
NEUTROS ABS: 7.4 10*3/uL (ref 1.4–7.7)
NEUTROS PCT: 69.7 % (ref 43.0–77.0)
PLATELETS: 302 10*3/uL (ref 150.0–400.0)
RBC: 4.36 Mil/uL (ref 3.87–5.11)
RDW: 16.8 % — AB (ref 11.5–15.5)
WBC: 10.7 10*3/uL — AB (ref 4.0–10.5)

## 2014-06-16 LAB — POCT URINALYSIS DIPSTICK
Bilirubin, UA: NEGATIVE
Blood, UA: NEGATIVE
GLUCOSE UA: NEGATIVE
KETONES UA: NEGATIVE
LEUKOCYTES UA: NEGATIVE
NITRITE UA: NEGATIVE
PROTEIN UA: NEGATIVE
SPEC GRAV UA: 1.025
Urobilinogen, UA: NEGATIVE
pH, UA: 6

## 2014-06-16 LAB — BASIC METABOLIC PANEL
BUN: 14 mg/dL (ref 6–23)
CO2: 28 mEq/L (ref 19–32)
CREATININE: 0.56 mg/dL (ref 0.40–1.20)
Calcium: 9.2 mg/dL (ref 8.4–10.5)
Chloride: 101 mEq/L (ref 96–112)
GFR: 122.03 mL/min (ref >60.00)
GLUCOSE: 89 mg/dL (ref 70–99)
POTASSIUM: 4.1 meq/L (ref 3.5–5.1)
Sodium: 136 mEq/L (ref 135–145)

## 2014-06-16 LAB — LIPID PANEL
CHOL/HDL RATIO: 4
CHOLESTEROL: 189 mg/dL (ref 0–200)
HDL: 49.9 mg/dL (ref >39.00)
LDL CALC: 111 mg/dL — AB (ref 0–99)
NonHDL: 139.1
TRIGLYCERIDES: 140 mg/dL (ref 0.0–149.0)
VLDL: 28 mg/dL (ref 0.0–40.0)

## 2014-06-16 LAB — TSH: TSH: 3.12 u[IU]/mL (ref 0.35–4.50)

## 2014-08-05 ENCOUNTER — Other Ambulatory Visit: Payer: Self-pay | Admitting: Family Medicine

## 2015-02-06 ENCOUNTER — Encounter: Payer: Self-pay | Admitting: Family Medicine

## 2015-02-06 ENCOUNTER — Ambulatory Visit (INDEPENDENT_AMBULATORY_CARE_PROVIDER_SITE_OTHER): Payer: BLUE CROSS/BLUE SHIELD | Admitting: Family Medicine

## 2015-02-06 VITALS — BP 120/80 | HR 105 | Temp 98.2°F | Wt 256.4 lb

## 2015-02-06 DIAGNOSIS — J302 Other seasonal allergic rhinitis: Secondary | ICD-10-CM

## 2015-02-06 MED ORDER — LORATADINE 10 MG PO TABS: 10.0000 mg | ORAL_TABLET | Freq: Every day | ORAL | Status: DC

## 2015-02-06 MED ORDER — FLUTICASONE PROPIONATE 50 MCG/ACT NA SUSP: 2.0000 | Freq: Every day | NASAL | Status: DC

## 2015-02-06 NOTE — Progress Notes (Signed)
Subjective:     Donna Lopez is a 49 y.o. female here for evaluation of a cough. Onset of symptoms was 2 months ago. Symptoms have been unchanged since that time. The cough is dry and is aggravated by nothing. Associated symptoms include: no wheezing, sore throat, sneezing etc. Patient does not have a history of asthma. Patient does have a history of environmental allergens. Patient has not traveled recently. Patient does not have a history of smoking. Patient has not had a previous chest x-ray. Patient has not had a PPD done.  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 She  does not have any pertinent problems on file. She  has no past surgical history on file. Her family history includes Colon cancer in her paternal grandfather; Colon cancer (age of onset: 52) in her sister; Diabetes in her father; Kidney cancer in her father; Rectal cancer in her sister; Uterine cancer in her mother. There is no history of Esophageal cancer or Stomach cancer. She  reports that she has never smoked. She has never used smokeless tobacco. She reports that she does not drink alcohol or use illicit drugs. She has a current medication list which includes the following prescription(s): adalimumab, aspirin, calcium + d3, doxycycline, esomeprazole, folic acid, l-lysine, methotrexate, and misc natural products. Current Outpatient Prescriptions on File Prior to Visit  Medication Sig Dispense Refill  . adalimumab (HUMIRA) 40 MG/0.8ML injection Inject 40 mg into the skin every 14 (fourteen) days.      Marland Kitchen aspirin 81 MG tablet Take 81 mg by mouth daily.    . Calcium Carb-Cholecalciferol (CALCIUM + D3) 600-200 MG-UNIT TABS Take 1 capsule by mouth daily.    Marland Kitchen doxycycline (VIBRAMYCIN) 50 MG capsule Take 50 mg by mouth daily.    Marland Kitchen esomeprazole (NEXIUM) 40  MG capsule Take 1 capsule (40 mg total) by mouth daily. 90 capsule 3  . folic acid (FOLVITE) 1 MG tablet     . L-Lysine 500 MG CAPS Take 1 capsule by mouth daily.    . methotrexate (RHEUMATREX) 2.5 MG tablet Take 20 mg by mouth once a week. Caution:Chemotherapy. Protect from light.    . Misc Natural Products (OSTEO BI-FLEX JOINT SHIELD PO) Take 2 tablets by mouth daily.     No current facility-administered medications on file prior to visit.   She is allergic to penicillins..  Review of Systems Pertinent items are noted in HPI.    Objective:    Oxygen saturation 97% on room air BP 120/80 mmHg  Pulse 105  Temp(Src) 98.2 F (36.8 C) (Oral)  Wt 256 lb 6.4 oz (116.302 kg)  SpO2 97% General appearance: alert, cooperative, appears stated age and no distress Ears: normal TM's and external ear canals both ears Nose: clear discharge, moderate congestion, turbinates red, swollen Throat: lips, mucosa, and tongue normal; teeth and gums normal Neck: no adenopathy, supple, symmetrical, trachea midline and thyroid not enlarged, symmetric, no tenderness/mass/nodules Lungs: clear to auscultation bilaterally Heart: S1, S2 normal    Assessment:    URI with Post Nasal Drip    Plan:    Explained lack of efficacy of antibiotics in viral disease. Avoid exposure to tobacco smoke and fumes. Call if shortness of breath worsens, blood in sputum, change in character of cough, development of fever or chills, inability to maintain nutrition and hydration. Avoid exposure to  tobacco smoke and fumes.   flonase and claritin otc cough med rto prn

## 2015-02-06 NOTE — Progress Notes (Signed)
Pre visit review using our clinic review tool, if applicable. No additional management support is needed unless otherwise documented below in the visit note. 

## 2015-02-06 NOTE — Patient Instructions (Signed)

## 2015-04-23 ENCOUNTER — Other Ambulatory Visit: Payer: Self-pay

## 2015-04-23 DIAGNOSIS — Z1231 Encounter for screening mammogram for malignant neoplasm of breast: Secondary | ICD-10-CM

## 2015-05-25 ENCOUNTER — Ambulatory Visit
Admission: RE | Admit: 2015-05-25 | Discharge: 2015-05-25 | Disposition: A | Discharge status: Home / Self Care | Payer: BLUE CROSS/BLUE SHIELD | Source: Ambulatory Visit

## 2015-05-25 DIAGNOSIS — Z1231 Encounter for screening mammogram for malignant neoplasm of breast: Secondary | ICD-10-CM

## 2015-06-15 ENCOUNTER — Encounter: Payer: Self-pay | Admitting: Family Medicine

## 2015-06-15 ENCOUNTER — Ambulatory Visit (INDEPENDENT_AMBULATORY_CARE_PROVIDER_SITE_OTHER): Payer: BLUE CROSS/BLUE SHIELD | Admitting: Family Medicine

## 2015-06-15 VITALS — BP 130/88 | HR 88 | Temp 97.8°F | Ht 63.0 in | Wt 255.2 lb

## 2015-06-15 DIAGNOSIS — Z114 Encounter for screening for human immunodeficiency virus [HIV]: Secondary | ICD-10-CM | POA: Diagnosis not present

## 2015-06-15 DIAGNOSIS — Z0000 Encounter for general adult medical examination without abnormal findings: Secondary | ICD-10-CM | POA: Diagnosis not present

## 2015-06-15 DIAGNOSIS — K219 Gastro-esophageal reflux disease without esophagitis: Secondary | ICD-10-CM

## 2015-06-15 DIAGNOSIS — B001 Herpesviral vesicular dermatitis: Secondary | ICD-10-CM

## 2015-06-15 DIAGNOSIS — R319 Hematuria, unspecified: Secondary | ICD-10-CM

## 2015-06-15 LAB — CBC WITH DIFFERENTIAL/PLATELET
BASOS ABS: 0 10*3/uL (ref 0.0–0.1)
Basophils Relative: 0.3 % (ref 0.0–3.0)
EOS ABS: 0.2 10*3/uL (ref 0.0–0.7)
Eosinophils Relative: 1.7 % (ref 0.0–5.0)
HEMATOCRIT: 39.8 % (ref 36.0–46.0)
Hemoglobin: 13 g/dL (ref 12.0–15.0)
LYMPHS ABS: 2.2 10*3/uL (ref 0.7–4.0)
LYMPHS PCT: 21.6 % (ref 12.0–46.0)
MCHC: 32.6 g/dL (ref 30.0–36.0)
MCV: 87.6 fl (ref 78.0–100.0)
MONOS PCT: 6.7 % (ref 3.0–12.0)
Monocytes Absolute: 0.7 10*3/uL (ref 0.1–1.0)
NEUTROS ABS: 7 10*3/uL (ref 1.4–7.7)
NEUTROS PCT: 69.7 % (ref 43.0–77.0)
PLATELETS: 324 10*3/uL (ref 150.0–400.0)
RBC: 4.54 Mil/uL (ref 3.87–5.11)
RDW: 17.4 % — ABNORMAL HIGH (ref 11.5–15.5)
WBC: 10.1 10*3/uL (ref 4.0–10.5)

## 2015-06-15 LAB — POCT URINALYSIS DIPSTICK
Bilirubin, UA: NEGATIVE
Glucose, UA: NEGATIVE
Ketones, UA: NEGATIVE
LEUKOCYTES UA: NEGATIVE
NITRITE UA: NEGATIVE
PH UA: 5.5
Protein, UA: NEGATIVE
Spec Grav, UA: >=1.03
Urobilinogen, UA: 0.2

## 2015-06-15 LAB — COMPREHENSIVE METABOLIC PANEL
ALT: 16 U/L (ref 0–35)
AST: 18 U/L (ref 0–37)
Albumin: 3.7 g/dL (ref 3.5–5.2)
Alkaline Phosphatase: 78 U/L (ref 39–117)
BILIRUBIN TOTAL: 0.3 mg/dL (ref 0.2–1.2)
BUN: 13 mg/dL (ref 6–23)
CALCIUM: 8.8 mg/dL (ref 8.4–10.5)
CO2: 29 meq/L (ref 19–32)
CREATININE: 0.53 mg/dL (ref 0.40–1.20)
Chloride: 102 mEq/L (ref 96–112)
GFR: 129.51 mL/min (ref >60.00)
Glucose, Bld: 89 mg/dL (ref 70–99)
Potassium: 3.9 mEq/L (ref 3.5–5.1)
Sodium: 137 mEq/L (ref 135–145)
TOTAL PROTEIN: 7.4 g/dL (ref 6.0–8.3)

## 2015-06-15 LAB — LIPID PANEL
Cholesterol: 170 mg/dL (ref 0–200)
HDL: 38 mg/dL — AB (ref >39.00)
LDL Cholesterol: 102 mg/dL — ABNORMAL HIGH (ref 0–99)
NonHDL: 131.98
TRIGLYCERIDES: 148 mg/dL (ref 0.0–149.0)
Total CHOL/HDL Ratio: 4
VLDL: 29.6 mg/dL (ref 0.0–40.0)

## 2015-06-15 LAB — TSH: TSH: 1.79 u[IU]/mL (ref 0.35–4.50)

## 2015-06-15 MED ORDER — ACYCLOVIR 5 % EX CREA: 1.0000 "application " | TOPICAL_CREAM | CUTANEOUS | Status: DC

## 2015-06-15 MED ORDER — VALACYCLOVIR HCL 1 G PO TABS: ORAL_TABLET | ORAL | Status: DC

## 2015-06-15 MED ORDER — ESOMEPRAZOLE MAGNESIUM 40 MG PO CPDR: 40.0000 mg | DELAYED_RELEASE_CAPSULE | Freq: Every day | ORAL | Status: DC

## 2015-06-15 NOTE — Progress Notes (Signed)
Subjective:     Donna Lopez is a 50 y.o. female and is here for a comprehensive physical exam. The patient reports no problems.  Social History   Social History  . Marital Status: Married    Spouse Name: N/A  . Number of Children: N/A  . Years of Education: N/A   Occupational History  .  [employer]   Social History Main Topics  . Smoking status: Never Smoker   . Smokeless tobacco: Never Used  . Alcohol Use: No     Comment: rare alcohol use-once/twice a year  . Drug Use: No  . Sexual Activity:    Partners: Male   Other Topics Concern  . Not on file   Social History Narrative   Exercise--- walks 1x a week   Health Maintenance  Topic Date Due  . HIV Screening  12/08/1979  . INFLUENZA VACCINE  06/14/2016 (Originally 12/18/2014)  . PAP SMEAR  06/14/2016 (Originally 04/23/2014)  . MAMMOGRAM  05/24/2016  . COLONOSCOPY  06/08/2017  . 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 She  does not have any pertinent problems on file. She  has no past surgical history on file. Her family history includes Colon cancer in her paternal grandfather; Colon cancer (age of onset: 51) in her sister; Diabetes in her father; Kidney cancer in her father; Rectal cancer in her sister; Uterine cancer in her mother. There is no history of Esophageal cancer or Stomach cancer. She  reports that she has never smoked. She has never used smokeless tobacco. She reports that she does not drink alcohol or use illicit drugs. She has a current medication list which includes the following prescription(s): adalimumab, aspirin, calcium + d3, doxycycline, esomeprazole, folic acid, l-lysine, methotrexate, misc natural products, acyclovir cream, and valacyclovir. Current Outpatient Prescriptions on File  Prior to Visit  Medication Sig Dispense Refill  . adalimumab (HUMIRA) 40 MG/0.8ML injection Inject 40 mg into the skin every 14 (fourteen) days.      Marland Kitchen aspirin 81 MG tablet Take 81 mg by mouth daily.    . Calcium Carb-Cholecalciferol (CALCIUM + D3) 600-200 MG-UNIT TABS Take 1 capsule by mouth daily.    Marland Kitchen doxycycline (VIBRAMYCIN) 50 MG capsule Take 50 mg by mouth daily.    . folic acid (FOLVITE) 1 MG tablet     . L-Lysine 500 MG CAPS Take 1 capsule by mouth daily.    . methotrexate (RHEUMATREX) 2.5 MG tablet Take 20 mg by mouth once a week. Caution:Chemotherapy. Protect from light.    . Misc Natural Products (OSTEO BI-FLEX JOINT SHIELD PO) Take 2 tablets by mouth daily.     No current facility-administered medications on file prior to visit.   She is allergic to penicillins..  Review of Systems Review of Systems  Constitutional: Negative for activity change, appetite change and fatigue.  HENT: Negative for hearing loss, congestion, tinnitus and ear discharge.  dentist q42mEyes: Negative for visual disturbance (see optho q2y -- vision corrected to 20/20 with glasses).  Respiratory: Negative for cough, chest tightness and shortness of breath.   Cardiovascular: Negative for chest pain, palpitations and leg swelling.  Gastrointestinal: Negative for abdominal pain, diarrhea, constipation and abdominal distention.  Genitourinary: Negative for urgency, frequency, decreased urine volume and difficulty urinating.  Musculoskeletal: Negative for back  pain, arthralgias and gait problem.  Skin: Negative for color change, pallor and rash.  Neurological: Negative for dizziness, light-headedness, numbness and headaches.  Hematological: Negative for adenopathy. Does not bruise/bleed easily.  Psychiatric/Behavioral: Negative for suicidal ideas, confusion, sleep disturbance, self-injury, dysphoric mood, decreased concentration and agitation.       Objective:    BP 130/88 mmHg  Pulse 88  Temp(Src)  97.8 F (36.6 C) (Oral)  Ht _0  (1.6 m)  Wt 255 lb 3.2 oz (115.758 kg)  BMI 45.22 kg/m2  SpO2 98%  LMP 06/13/2015 General appearance: alert, cooperative, appears stated age and no distress Head: Normocephalic, without obvious abnormality, atraumatic Eyes: conjunctivae/corneas clear. PERRL, EOM's intact. Fundi benign. Ears: normal TM's and external ear canals both ears Nose: Nares normal. Septum midline. Mucosa normal. No drainage or sinus tenderness. Throat: lips, mucosa, and tongue normal; teeth and gums normal Neck: no adenopathy, no carotid bruit, no JVD, supple, symmetrical, trachea midline and thyroid not enlarged, symmetric, no tenderness/mass/nodules Back: symmetric, no curvature. ROM normal. No CVA tenderness. Lungs: clear to auscultation bilaterally Breasts: normal appearance, no masses or tenderness Heart: regular rate and rhythm, S1, S2 normal, no murmur, click, rub or gallop Abdomen: soft, non-tender; bowel sounds normal; no masses,  no organomegaly Pelvic: deferred Extremities: extremities normal, atraumatic, no cyanosis or edema Pulses: 2+ and symmetric Skin: Skin color, texture, turgor normal. No rashes or lesions Lymph nodes: Cervical, supraclavicular, and axillary nodes normal. Neurologic: Alert and oriented X 3, normal strength and tone. Normal symmetric reflexes. Normal coordination and gait Psych- no depression and anxiety      Assessment:    Healthy female exam.     Plan:    ghm utd  Check labs See After Visit Summary for Counseling Recommendations   rto for pap-- pt on menses  1. Preventative health care See above - Comp Met (CMET) - CBC with Differential/Platelet - Lipid panel - HIV antibody - POCT urinalysis dipstick - TSH  2. Encounter for screening for HIV  - HIV antibody  3. Gastroesophageal reflux disease, esophagitis presence not specified stable - esomeprazole (NEXIUM) 40 MG capsule; Take 1 capsule (40 mg total) by mouth daily.   Dispense: 90 capsule; Refill: 3

## 2015-06-15 NOTE — Patient Instructions (Signed)
Preventive Care for Adults, Female A healthy lifestyle and preventive care can promote health and wellness. Preventive health guidelines for women include the following key practices.  A routine yearly physical is a good way to check with your health care provider about your health and preventive screening. It is a chance to share any concerns and updates on your health and to receive a thorough exam.  Visit your dentist for a routine exam and preventive care every 6 months. Brush your teeth twice a day and floss once a day. Good oral hygiene prevents tooth decay and gum disease.  The frequency of eye exams is based on your age, health, family medical history, use of contact lenses, and other factors. Follow your health care provider's recommendations for frequency of eye exams.  Eat a healthy diet. Foods like vegetables, fruits, whole grains, low-fat dairy products, and lean protein foods contain the nutrients you need without too many calories. Decrease your intake of foods high in solid fats, added sugars, and salt. Eat the right amount of calories for you.Get information about a proper diet from your health care provider, if necessary.  Regular physical exercise is one of the most important things you can do for your health. Most adults should get at least 150 minutes of moderate-intensity exercise (any activity that increases your heart rate and causes you to sweat) each week. In addition, most adults need muscle-strengthening exercises on 2 or more days a week.  Maintain a healthy weight. The body mass index (BMI) is a screening tool to identify possible weight problems. It provides an estimate of body fat based on height and weight. Your health care provider can find your BMI and can help you achieve or maintain a healthy weight.For adults 20 years and older:  A BMI below 18.5 is considered underweight.  A BMI of 18.5 to 24.9 is normal.  A BMI of 25 to 29.9 is considered overweight.  A  BMI of 30 and above is considered obese.  Maintain normal blood lipids and cholesterol levels by exercising and minimizing your intake of saturated fat. Eat a balanced diet with plenty of fruit and vegetables. Blood tests for lipids and cholesterol should begin at age 51 and be repeated every 5 years. If your lipid or cholesterol levels are high, you are over 51, or you are at high risk for heart disease, you may need your cholesterol levels checked more frequently.Ongoing high lipid and cholesterol levels should be treated with medicines if diet and exercise are not working.  If you smoke, find out from your health care provider how to quit. If you do not use tobacco, do not start.  Lung cancer screening is recommended for adults aged 45-80 years who are at high risk for developing lung cancer because of a history of smoking. A yearly low-dose CT scan of the lungs is recommended for people who have at least a 30-pack-year history of smoking and are a current smoker or have quit within the past 15 years. A pack year of smoking is smoking an average of 1 pack of cigarettes a day for 1 year (for example: 1 pack a day for 30 years or 2 packs a day for 15 years). Yearly screening should continue until the smoker has stopped smoking for at least 15 years. Yearly screening should be stopped for people who develop a health problem that would prevent them from having lung cancer treatment.  If you are pregnant, do not drink alcohol. If you are  breastfeeding, be very cautious about drinking alcohol. be very cautious about drinking alcohol. If you are not pregnant and choose to drink alcohol, do not have more than 1 drink per day. One drink is considered to be 12 ounces (355 mL) of beer, 5 ounces (148 mL) of wine, or 1.5 ounces (44 mL) of liquor.  Avoid use of street drugs. Do not share needles with anyone. Ask for help if you need support or instructions about stopping the use of drugs.  High blood pressure causes heart disease and increases the risk  of stroke. Your blood pressure should be checked at least every 1 to 2 years. Ongoing high blood pressure should be treated with medicines if weight loss and exercise do not work.  If you are 51-67 years old, ask your health care provider if you should take aspirin to prevent strokes.  Diabetes screening is done by taking a blood sample to check your blood glucose level after you have not eaten for a certain period of time (fasting). If you are not overweight and you do not have risk factors for diabetes, you should be screened once every 3 years starting at age 28. If you are overweight or obese and you are 51-39 years of age, you should be screened for diabetes every year as part of your cardiovascular risk assessment.  Breast cancer screening is essential preventive care for women. You should practice "breast self-awareness." This means understanding the normal appearance and feel of your breasts and may include breast self-examination. Any changes detected, no matter how small, should be reported to a health care provider. Women in their 51s and 30s should have a clinical breast exam (CBE) by a health care provider as part of a regular health exam every 1 to 3 years. After age 51, women should have a CBE every year. Starting at age 51, women should consider having a mammogram (breast X-ray test) every year. Women who have a family history of breast cancer should talk to their health care provider about genetic screening. Women at a high risk of breast cancer should talk to their health care providers about having an MRI and a mammogram every year.  Breast cancer gene (BRCA)-related cancer risk assessment is recommended for women who have family members with BRCA-related cancers. BRCA-related cancers include breast, ovarian, tubal, and peritoneal cancers. Having family members with these cancers may be associated with an increased risk for harmful changes (mutations) in the breast cancer genes BRCA1 and  BRCA2. Results of the assessment will determine the need for genetic counseling and BRCA1 and BRCA2 testing.  Your health care provider may recommend that you be screened regularly for cancer of the pelvic organs (ovaries, uterus, and vagina). This screening involves a pelvic examination, including checking for microscopic changes to the surface of your cervix (Pap test). You may be encouraged to have this screening done every 3 years, beginning at age 25.  For women ages 57-65, health care providers may recommend pelvic exams and Pap testing every 3 years, or they may recommend the Pap and pelvic exam, combined with testing for human papilloma virus (HPV), every 5 years. Some types of HPV increase your risk of cervical cancer. Testing for HPV may also be done on women of any age with unclear Pap test results.  Other health care providers may not recommend any screening for nonpregnant women who are considered low risk for pelvic cancer and who do not have symptoms. Ask your health care provider if a screening pelvic exam is right for  you.  If you have had past treatment for cervical cancer or a condition that could lead to cancer, you need Pap tests and screening for cancer for at least 20 years after your treatment. If Pap tests have been discontinued, your risk factors (such as having a new sexual partner) need to be reassessed to determine if screening should resume. Some women have medical problems that increase the chance of getting cervical cancer. In these cases, your health care provider may recommend more frequent screening and Pap tests.  Colorectal cancer can be detected and often prevented. Most routine colorectal cancer screening begins at the age of 50 years and continues through age 75 years. However, your health care provider may recommend screening at an earlier age if you have risk factors for colon cancer. On a yearly basis, your health care provider may provide home test kits to check  for hidden blood in the stool. Use of a small camera at the end of a tube, to directly examine the colon (sigmoidoscopy or colonoscopy), can detect the earliest forms of colorectal cancer. Talk to your health care provider about this at age 50, when routine screening begins. Direct exam of the colon should be repeated every 5-10 years through age 75 years, unless early forms of precancerous polyps or small growths are found.  People who are at an increased risk for hepatitis B should be screened for this virus. You are considered at high risk for hepatitis B if:  You were born in a country where hepatitis B occurs often. Talk with your health care provider about which countries are considered high risk.  Your parents were born in a high-risk country and you have not received a shot to protect against hepatitis B (hepatitis B vaccine).  You have HIV or AIDS.  You use needles to inject street drugs.  You live with, or have sex with, someone who has hepatitis B.  You get hemodialysis treatment.  You take certain medicines for conditions like cancer, organ transplantation, and autoimmune conditions.  Hepatitis C blood testing is recommended for all people born from 1945 through 1965 and any individual with known risks for hepatitis C.  Practice safe sex. Use condoms and avoid high-risk sexual practices to reduce the spread of sexually transmitted infections (STIs). STIs include gonorrhea, chlamydia, syphilis, trichomonas, herpes, HPV, and human immunodeficiency virus (HIV). Herpes, HIV, and HPV are viral illnesses that have no cure. They can result in disability, cancer, and death.  You should be screened for sexually transmitted illnesses (STIs) including gonorrhea and chlamydia if:  You are sexually active and are younger than 24 years.  You are older than 24 years and your health care provider tells you that you are at risk for this type of infection.  Your sexual activity has changed  since you were last screened and you are at an increased risk for chlamydia or gonorrhea. Ask your health care provider if you are at risk.  If you are at risk of being infected with HIV, it is recommended that you take a prescription medicine daily to prevent HIV infection. This is called preexposure prophylaxis (PrEP). You are considered at risk if:  You are sexually active and do not regularly use condoms or know the HIV status of your partner(s).  You take drugs by injection.  You are sexually active with a partner who has HIV.  Talk with your health care provider about whether you are at high risk of being infected with HIV. If   you choose to begin PrEP, you should first be tested for HIV. You should then be tested every 3 months for as long as you are taking PrEP.  Osteoporosis is a disease in which the bones lose minerals and strength with aging. This can result in serious bone fractures or breaks. The risk of osteoporosis can be identified using a bone density scan. Women ages 67 years and over and women at risk for fractures or osteoporosis should discuss screening with their health care providers. Ask your health care provider whether you should take a calcium supplement or vitamin D to reduce the rate of osteoporosis.  Menopause can be associated with physical symptoms and risks. Hormone replacement therapy is available to decrease symptoms and risks. You should talk to your health care provider about whether hormone replacement therapy is right for you.  Use sunscreen. Apply sunscreen liberally and repeatedly throughout the day. You should seek shade when your shadow is shorter than you. Protect yourself by wearing long sleeves, pants, a wide-brimmed hat, and sunglasses year round, whenever you are outdoors.  Once a month, do a whole body skin exam, using a mirror to look at the skin on your back. Tell your health care provider of new moles, moles that have irregular borders, moles that  are larger than a pencil eraser, or moles that have changed in shape or color.  Stay current with required vaccines (immunizations).  Influenza vaccine. All adults should be immunized every year.  Tetanus, diphtheria, and acellular pertussis (Td, Tdap) vaccine. Pregnant women should receive 1 dose of Tdap vaccine during each pregnancy. The dose should be obtained regardless of the length of time since the last dose. Immunization is preferred during the 27th-36th week of gestation. An adult who has not previously received Tdap or who does not know her vaccine status should receive 1 dose of Tdap. This initial dose should be followed by tetanus and diphtheria toxoids (Td) booster doses every 10 years. Adults with an unknown or incomplete history of completing a 3-dose immunization series with Td-containing vaccines should begin or complete a primary immunization series including a Tdap dose. Adults should receive a Td booster every 10 years.  Varicella vaccine. An adult without evidence of immunity to varicella should receive 2 doses or a second dose if she has previously received 1 dose. Pregnant females who do not have evidence of immunity should receive the first dose after pregnancy. This first dose should be obtained before leaving the health care facility. The second dose should be obtained 4-8 weeks after the first dose.  Human papillomavirus (HPV) vaccine. Females aged 13-26 years who have not received the vaccine previously should obtain the 3-dose series. The vaccine is not recommended for use in pregnant females. However, pregnancy testing is not needed before receiving a dose. If a female is found to be pregnant after receiving a dose, no treatment is needed. In that case, the remaining doses should be delayed until after the pregnancy. Immunization is recommended for any person with an immunocompromised condition through the age of 61 years if she did not get any or all doses earlier. During the  3-dose series, the second dose should be obtained 4-8 weeks after the first dose. The third dose should be obtained 24 weeks after the first dose and 16 weeks after the second dose.  Zoster vaccine. One dose is recommended for adults aged 30 years or older unless certain conditions are present.  Measles, mumps, and rubella (MMR) vaccine. Adults born  before 1957 generally are considered immune to measles and mumps. Adults born in 1957 or later should have 1 or more doses of MMR vaccine unless there is a contraindication to the vaccine or there is laboratory evidence of immunity to each of the three diseases. A routine second dose of MMR vaccine should be obtained at least 28 days after the first dose for students attending postsecondary schools, health care workers, or international travelers. People who received inactivated measles vaccine or an unknown type of measles vaccine during 1963-1967 should receive 2 doses of MMR vaccine. People who received inactivated mumps vaccine or an unknown type of mumps vaccine before 1979 and are at high risk for mumps infection should consider immunization with 2 doses of MMR vaccine. For females of childbearing age, rubella immunity should be determined. If there is no evidence of immunity, females who are not pregnant should be vaccinated. If there is no evidence of immunity, females who are pregnant should delay immunization until after pregnancy. Unvaccinated health care workers born before 1957 who lack laboratory evidence of measles, mumps, or rubella immunity or laboratory confirmation of disease should consider measles and mumps immunization with 2 doses of MMR vaccine or rubella immunization with 1 dose of MMR vaccine.  Pneumococcal 13-valent conjugate (PCV13) vaccine. When indicated, a person who is uncertain of his immunization history and has no record of immunization should receive the PCV13 vaccine. All adults 65 years of age and older should receive this  vaccine. An adult aged 19 years or older who has certain medical conditions and has not been previously immunized should receive 1 dose of PCV13 vaccine. This PCV13 should be followed with a dose of pneumococcal polysaccharide (PPSV23) vaccine. Adults who are at high risk for pneumococcal disease should obtain the PPSV23 vaccine at least 8 weeks after the dose of PCV13 vaccine. Adults older than 51 years of age who have normal immune system function should obtain the PPSV23 vaccine dose at least 1 year after the dose of PCV13 vaccine.  Pneumococcal polysaccharide (PPSV23) vaccine. When PCV13 is also indicated, PCV13 should be obtained first. All adults aged 65 years and older should be immunized. An adult younger than age 65 years who has certain medical conditions should be immunized. Any person who resides in a nursing home or long-term care facility should be immunized. An adult smoker should be immunized. People with an immunocompromised condition and certain other conditions should receive both PCV13 and PPSV23 vaccines. People with human immunodeficiency virus (HIV) infection should be immunized as soon as possible after diagnosis. Immunization during chemotherapy or radiation therapy should be avoided. Routine use of PPSV23 vaccine is not recommended for American Indians, [place] Natives, or people younger than 65 years unless there are medical conditions that require PPSV23 vaccine. When indicated, people who have unknown immunization and have no record of immunization should receive PPSV23 vaccine. One-time revaccination 5 years after the first dose of PPSV23 is recommended for people aged 19-64 years who have chronic kidney failure, nephrotic syndrome, asplenia, or immunocompromised conditions. People who received 1-2 doses of PPSV23 before age 65 years should receive another dose of PPSV23 vaccine at age 65 years or later if at least 5 years have passed since the previous dose. Doses of PPSV23 are not  needed for people immunized with PPSV23 at or after age 65 years.  Meningococcal vaccine. Adults with asplenia or persistent complement component deficiencies should receive 2 doses of quadrivalent meningococcal conjugate (MenACWY-D) vaccine. The doses should be obtained   at least 2 months apart. Microbiologists working with certain meningococcal bacteria, [place] recruits, people at risk during an outbreak, and people who travel to or live in countries with a high rate of meningitis should be immunized. A first-year college student up through age 34 years who is living in a residence hall should receive a dose if she did not receive a dose on or after her 16th birthday. Adults who have certain high-risk conditions should receive one or more doses of vaccine.  Hepatitis A vaccine. Adults who wish to be protected from this disease, have certain high-risk conditions, work with hepatitis A-infected animals, work in hepatitis A research labs, or travel to or work in countries with a high rate of hepatitis A should be immunized. Adults who were previously unvaccinated and who anticipate close contact with an international adoptee during the first 60 days after arrival in the [place] from a country with a high rate of hepatitis A should be immunized.  Hepatitis B vaccine. Adults who wish to be protected from this disease, have certain high-risk conditions, may be exposed to blood or other infectious body fluids, are household contacts or sex partners of hepatitis B positive people, are clients or workers in certain care facilities, or travel to or work in countries with a high rate of hepatitis B should be immunized.  Haemophilus influenzae type b (Hib) vaccine. A previously unvaccinated person with asplenia or sickle cell disease or having a scheduled splenectomy should receive 1 dose of Hib vaccine. Regardless of previous immunization, a recipient of a hematopoietic stem cell transplant should receive a  3-dose series 6-12 months after her successful transplant. Hib vaccine is not recommended for adults with HIV infection. Preventive Services / Frequency Ages 35 to 4 years  Blood pressure check.** / Every 3-5 years.  Lipid and cholesterol check.** / Every 5 years beginning at age 60.  Clinical breast exam.** / Every 3 years for women in their 71s and 10s.  BRCA-related cancer risk assessment.** / For women who have family members with a BRCA-related cancer (breast, ovarian, tubal, or peritoneal cancers).  Pap test.** / Every 2 years from ages 76 through 26. Every 3 years starting at age 61 through age 76 or 93 with a history of 3 consecutive normal Pap tests.  HPV screening.** / Every 3 years from ages 37 through ages 60 to 51 with a history of 3 consecutive normal Pap tests.  Hepatitis C blood test.** / For any individual with known risks for hepatitis C.  Skin self-exam. / Monthly.  Influenza vaccine. / Every year.  Tetanus, diphtheria, and acellular pertussis (Tdap, Td) vaccine.** / Consult your health care provider. Pregnant women should receive 1 dose of Tdap vaccine during each pregnancy. 1 dose of Td every 10 years.  Varicella vaccine.** / Consult your health care provider. Pregnant females who do not have evidence of immunity should receive the first dose after pregnancy.  HPV vaccine. / 3 doses over 6 months, if 93 and younger. The vaccine is not recommended for use in pregnant females. However, pregnancy testing is not needed before receiving a dose.  Measles, mumps, rubella (MMR) vaccine.** / You need at least 1 dose of MMR if you were born in 1957 or later. You may also need a 2nd dose. For females of childbearing age, rubella immunity should be determined. If there is no evidence of immunity, females who are not pregnant should be vaccinated. If there is no evidence of immunity, females who are  pregnant should delay immunization until after pregnancy.  Pneumococcal  13-valent conjugate (PCV13) vaccine.** / Consult your health care provider.  Pneumococcal polysaccharide (PPSV23) vaccine.** / 1 to 2 doses if you smoke cigarettes or if you have certain conditions.  Meningococcal vaccine.** / 1 dose if you are age 68 to 8 years and a Market researcher living in a residence hall, or have one of several medical conditions, you need to get vaccinated against meningococcal disease. You may also need additional booster doses.  Hepatitis A vaccine.** / Consult your health care provider.  Hepatitis B vaccine.** / Consult your health care provider.  Haemophilus influenzae type b (Hib) vaccine.** / Consult your health care provider. Ages 7 to 53 years  Blood pressure check.** / Every year.  Lipid and cholesterol check.** / Every 5 years beginning at age 25 years.  Lung cancer screening. / Every year if you are aged 11-80 years and have a 30-pack-year history of smoking and currently smoke or have quit within the past 15 years. Yearly screening is stopped once you have quit smoking for at least 15 years or develop a health problem that would prevent you from having lung cancer treatment.  Clinical breast exam.** / Every year after age 48 years.  BRCA-related cancer risk assessment.** / For women who have family members with a BRCA-related cancer (breast, ovarian, tubal, or peritoneal cancers).  Mammogram.** / Every year beginning at age 41 years and continuing for as long as you are in good health. Consult with your health care provider.  Pap test.** / Every 3 years starting at age 65 years through age 37 or 70 years with a history of 3 consecutive normal Pap tests.  HPV screening.** / Every 3 years from ages 72 years through ages 60 to 40 years with a history of 3 consecutive normal Pap tests.  Fecal occult blood test (FOBT) of stool. / Every year beginning at age 21 years and continuing until age 5 years. You may not need to do this test if you get  a colonoscopy every 10 years.  Flexible sigmoidoscopy or colonoscopy.** / Every 5 years for a flexible sigmoidoscopy or every 10 years for a colonoscopy beginning at age 35 years and continuing until age 48 years.  Hepatitis C blood test.** / For all people born from [birth year] through 1965 and any individual with known risks for hepatitis C.  Skin self-exam. / Monthly.  Influenza vaccine. / Every year.  Tetanus, diphtheria, and acellular pertussis (Tdap/Td) vaccine.** / Consult your health care provider. Pregnant women should receive 1 dose of Tdap vaccine during each pregnancy. 1 dose of Td every 10 years.  Varicella vaccine.** / Consult your health care provider. Pregnant females who do not have evidence of immunity should receive the first dose after pregnancy.  Zoster vaccine.** / 1 dose for adults aged 30 years or older.  Measles, mumps, rubella (MMR) vaccine.** / You need at least 1 dose of MMR if you were born in 1957 or later. You may also need a second dose. For females of childbearing age, rubella immunity should be determined. If there is no evidence of immunity, females who are not pregnant should be vaccinated. If there is no evidence of immunity, females who are pregnant should delay immunization until after pregnancy.  Pneumococcal 13-valent conjugate (PCV13) vaccine.** / Consult your health care provider.  Pneumococcal polysaccharide (PPSV23) vaccine.** / 1 to 2 doses if you smoke cigarettes or if you have certain conditions.  Meningococcal vaccine.** /  Consult your health care provider.  Hepatitis A vaccine.** / Consult your health care provider.  Hepatitis B vaccine.** / Consult your health care provider.  Haemophilus influenzae type b (Hib) vaccine.** / Consult your health care provider. Ages 104 years and over  Blood pressure check.** / Every year.  Lipid and cholesterol check.** / Every 5 years beginning at age 32 years.  Lung cancer screening. / Every year if you  are aged 7-80 years and have a 30-pack-year history of smoking and currently smoke or have quit within the past 15 years. Yearly screening is stopped once you have quit smoking for at least 15 years or develop a health problem that would prevent you from having lung cancer treatment.  Clinical breast exam.** / Every year after age 77 years.  BRCA-related cancer risk assessment.** / For women who have family members with a BRCA-related cancer (breast, ovarian, tubal, or peritoneal cancers).  Mammogram.** / Every year beginning at age 60 years and continuing for as long as you are in good health. Consult with your health care provider.  Pap test.** / Every 3 years starting at age 91 years through age 85 or 63 years with 3 consecutive normal Pap tests. Testing can be stopped between 65 and 70 years with 3 consecutive normal Pap tests and no abnormal Pap or HPV tests in the past 10 years.  HPV screening.** / Every 3 years from ages 36 years through ages 18 or 45 years with a history of 3 consecutive normal Pap tests. Testing can be stopped between 65 and 70 years with 3 consecutive normal Pap tests and no abnormal Pap or HPV tests in the past 10 years.  Fecal occult blood test (FOBT) of stool. / Every year beginning at age 65 years and continuing until age 88 years. You may not need to do this test if you get a colonoscopy every 10 years.  Flexible sigmoidoscopy or colonoscopy.** / Every 5 years for a flexible sigmoidoscopy or every 10 years for a colonoscopy beginning at age 88 years and continuing until age 52 years.  Hepatitis C blood test.** / For all people born from [birth year] through 1966 and any individual with known risks for hepatitis C.  Osteoporosis screening.** / A one-time screening for women ages 20 years and over and women at risk for fractures or osteoporosis.  Skin self-exam. / Monthly.  Influenza vaccine. / Every year.  Tetanus, diphtheria, and acellular pertussis (Tdap/Td)  vaccine.** / 1 dose of Td every 10 years.  Varicella vaccine.** / Consult your health care provider.  Zoster vaccine.** / 1 dose for adults aged 25 years or older.  Pneumococcal 13-valent conjugate (PCV13) vaccine.** / Consult your health care provider.  Pneumococcal polysaccharide (PPSV23) vaccine.** / 1 dose for all adults aged 38 years and older.  Meningococcal vaccine.** / Consult your health care provider.  Hepatitis A vaccine.** / Consult your health care provider.  Hepatitis B vaccine.** / Consult your health care provider.  Haemophilus influenzae type b (Hib) vaccine.** / Consult your health care provider. ** Family history and personal history of risk and conditions may change your health care provider's recommendations.   This information is not intended to replace advice given to you by your health care provider. Make sure you discuss any questions you have with your health care provider.   Document Released: 07/01/2001 Document Revised: 05/26/2014 Document Reviewed: 09/30/2010 Elsevier Interactive Patient Education [employer].

## 2015-06-15 NOTE — Progress Notes (Signed)
Pre visit review using our clinic review tool, if applicable. No additional management support is needed unless otherwise documented below in the visit note. 

## 2015-06-16 LAB — HIV ANTIBODY (ROUTINE TESTING W REFLEX): HIV 1&2 Ab, 4th Generation: NONREACTIVE

## 2015-06-20 LAB — URINE CULTURE
Colony Count: NO GROWTH
ORGANISM ID, BACTERIA: NO GROWTH

## 2016-03-28 ENCOUNTER — Telehealth (INDEPENDENT_AMBULATORY_CARE_PROVIDER_SITE_OTHER): Payer: Self-pay | Admitting: Orthopaedic Surgery

## 2016-03-28 NOTE — Telephone Encounter (Signed)
Patient called to follow up on knee injections. Have they been approved yet? Please advise.

## 2016-04-02 NOTE — Telephone Encounter (Signed)
Euflexxa 

## 2016-04-03 ENCOUNTER — Other Ambulatory Visit: Payer: Self-pay | Admitting: Rheumatology

## 2016-04-04 ENCOUNTER — Other Ambulatory Visit: Payer: Self-pay | Admitting: Rheumatology

## 2016-04-04 MED ORDER — METHOTREXATE 2.5 MG PO TABS: 20.0000 mg | ORAL_TABLET | ORAL | 0 refills | Status: DC

## 2016-04-04 NOTE — Telephone Encounter (Signed)
I signed the mtx order and sent it into her pharmacy already. You don't need to do anything!

## 2016-04-04 NOTE — Telephone Encounter (Signed)
Last Visit: 01/31/16 Next Visit due Feb. 2018 Labs 02/01/2016 WNL  Okay to refill MTX?

## 2016-04-04 NOTE — Telephone Encounter (Signed)
Okay to refill MTX? 

## 2016-04-28 ENCOUNTER — Telehealth (INDEPENDENT_AMBULATORY_CARE_PROVIDER_SITE_OTHER): Payer: Self-pay | Admitting: Orthopaedic Surgery

## 2016-04-28 NOTE — Telephone Encounter (Signed)
Patient called to check status of Euflexxa injections. Please advise.  

## 2016-04-29 NOTE — Telephone Encounter (Signed)
Sent again

## 2016-05-01 ENCOUNTER — Telehealth (INDEPENDENT_AMBULATORY_CARE_PROVIDER_SITE_OTHER): Payer: Self-pay | Admitting: Orthopaedic Surgery

## 2016-05-01 ENCOUNTER — Telehealth (INDEPENDENT_AMBULATORY_CARE_PROVIDER_SITE_OTHER): Payer: Self-pay

## 2016-05-01 NOTE — Telephone Encounter (Signed)
Patient says there is confusion about insurance for Euflexxa injections. She states she was told she had [employer] as secondary and she has never had it as secondary. Please call patient.

## 2016-05-01 NOTE — Telephone Encounter (Signed)
Called pt about this. She had a secondary insurance in her file from 2014 that had expired. Her current ins co will not approve injections -Hyalgan as of 04/18/2016.

## 2016-05-01 NOTE — Telephone Encounter (Signed)
Spoke with Pt and informed her her insurance company no longer will approve Hyalgan injection as of 04/18/2016.

## 2016-05-13 ENCOUNTER — Other Ambulatory Visit: Payer: Self-pay | Admitting: Rheumatology

## 2016-05-14 NOTE — Telephone Encounter (Signed)
01/31/16 last visit and labs  Labs due  07/30/15 TB gold negative Will call patient Next visit not scheduled message sent

## 2016-05-15 NOTE — Telephone Encounter (Signed)
Okay to refill 30 day supply of Humira.

## 2016-05-15 NOTE — Telephone Encounter (Signed)
Patient's follow up visit scheduled for 06/10/16.  Patient advised she needs labs and will come next Tuesday morning to get her labs done.   Okay to refill 30 supply of Humira?

## 2016-05-20 ENCOUNTER — Other Ambulatory Visit: Payer: Self-pay | Admitting: Radiology

## 2016-05-20 DIAGNOSIS — Z79899 Other long term (current) drug therapy: Secondary | ICD-10-CM | POA: Diagnosis not present

## 2016-05-20 LAB — CBC WITH DIFFERENTIAL/PLATELET
BASOS PCT: 0 %
Basophils Absolute: 0 cells/uL (ref 0–200)
Eosinophils Absolute: 174 cells/uL (ref 15–500)
Eosinophils Relative: 2 %
HCT: 39.7 % (ref 35.0–45.0)
HEMOGLOBIN: 12.6 g/dL (ref 11.7–15.5)
LYMPHS ABS: 2001 {cells}/uL (ref 850–3900)
Lymphocytes Relative: 23 %
MCH: 27.6 pg (ref 27.0–33.0)
MCHC: 31.7 g/dL — AB (ref 32.0–36.0)
MCV: 87.1 fL (ref 80.0–100.0)
MONO ABS: 870 {cells}/uL (ref 200–950)
MPV: 9.4 fL (ref 7.5–12.5)
Monocytes Relative: 10 %
NEUTROS ABS: 5655 {cells}/uL (ref 1500–7800)
NEUTROS PCT: 65 %
Platelets: 319 10*3/uL (ref 140–400)
RBC: 4.56 MIL/uL (ref 3.80–5.10)
RDW: 17.5 % — ABNORMAL HIGH (ref 11.0–15.0)
WBC: 8.7 10*3/uL (ref 3.8–10.8)

## 2016-05-21 LAB — COMPREHENSIVE METABOLIC PANEL
ALBUMIN: 3.7 g/dL (ref 3.6–5.1)
ALK PHOS: 68 U/L (ref 33–130)
ALT: 12 U/L (ref 6–29)
AST: 15 U/L (ref 10–35)
BILIRUBIN TOTAL: 0.3 mg/dL (ref 0.2–1.2)
BUN: 18 mg/dL (ref 7–25)
CALCIUM: 8.9 mg/dL (ref 8.6–10.4)
CO2: 25 mmol/L (ref 20–31)
Chloride: 104 mmol/L (ref 98–110)
Creat: 0.54 mg/dL (ref 0.50–1.05)
Glucose, Bld: 124 mg/dL — ABNORMAL HIGH (ref 65–99)
Potassium: 4.2 mmol/L (ref 3.5–5.3)
Sodium: 138 mmol/L (ref 135–146)
TOTAL PROTEIN: 7 g/dL (ref 6.1–8.1)

## 2016-05-21 NOTE — Progress Notes (Signed)
Labs normal.

## 2016-06-03 ENCOUNTER — Other Ambulatory Visit: Payer: Self-pay | Admitting: Rheumatology

## 2016-06-03 NOTE — Telephone Encounter (Signed)
Last Visit: 01/31/16 Next Visit: 06/10/16 Labs: 05/20/16 TB Gold: 07/30/15 Neg  Okay to refill Humira?

## 2016-06-06 ENCOUNTER — Telehealth: Payer: Self-pay | Admitting: Rheumatology

## 2016-06-06 MED ORDER — FOLIC ACID 1 MG PO TABS: 2.0000 mg | ORAL_TABLET | Freq: Every day | ORAL | 4 refills | Status: DC

## 2016-06-06 NOTE — Telephone Encounter (Signed)
[facility]

## 2016-06-06 NOTE — Telephone Encounter (Signed)
[facility] f[facility] for patient. Cb# [id number] reference # [id number]

## 2016-06-10 ENCOUNTER — Ambulatory Visit: Payer: Self-pay | Admitting: Rheumatology

## 2016-06-16 ENCOUNTER — Encounter: Payer: Self-pay | Admitting: Family Medicine

## 2016-06-16 ENCOUNTER — Ambulatory Visit (INDEPENDENT_AMBULATORY_CARE_PROVIDER_SITE_OTHER): Payer: BLUE CROSS/BLUE SHIELD | Admitting: Family Medicine

## 2016-06-16 VITALS — BP 132/82 | HR 96 | Temp 98.2°F | Resp 16 | Ht 62.8 in | Wt 257.8 lb

## 2016-06-16 DIAGNOSIS — Z0000 Encounter for general adult medical examination without abnormal findings: Secondary | ICD-10-CM | POA: Diagnosis not present

## 2016-06-16 DIAGNOSIS — Z23 Encounter for immunization: Secondary | ICD-10-CM

## 2016-06-16 DIAGNOSIS — K219 Gastro-esophageal reflux disease without esophagitis: Secondary | ICD-10-CM | POA: Diagnosis not present

## 2016-06-16 LAB — CBC WITH DIFFERENTIAL/PLATELET
BASOS ABS: 0.1 10*3/uL (ref 0.0–0.1)
Basophils Relative: 0.8 % (ref 0.0–3.0)
EOS PCT: 2.5 % (ref 0.0–5.0)
Eosinophils Absolute: 0.2 10*3/uL (ref 0.0–0.7)
HEMATOCRIT: 37.9 % (ref 36.0–46.0)
Hemoglobin: 12.3 g/dL (ref 12.0–15.0)
LYMPHS ABS: 2.2 10*3/uL (ref 0.7–4.0)
Lymphocytes Relative: 24.9 % (ref 12.0–46.0)
MCHC: 32.5 g/dL (ref 30.0–36.0)
MCV: 86.6 fl (ref 78.0–100.0)
MONOS PCT: 8.3 % (ref 3.0–12.0)
Monocytes Absolute: 0.7 10*3/uL (ref 0.1–1.0)
Neutro Abs: 5.5 10*3/uL (ref 1.4–7.7)
Neutrophils Relative %: 63.5 % (ref 43.0–77.0)
Platelets: 337 10*3/uL (ref 150.0–400.0)
RBC: 4.37 Mil/uL (ref 3.87–5.11)
RDW: 17.7 % — ABNORMAL HIGH (ref 11.5–15.5)
WBC: 8.7 10*3/uL (ref 4.0–10.5)

## 2016-06-16 LAB — POC URINALSYSI DIPSTICK (AUTOMATED)
BILIRUBIN UA: NEGATIVE
GLUCOSE UA: NEGATIVE
KETONES UA: NEGATIVE
Leukocytes, UA: NEGATIVE
Nitrite, UA: NEGATIVE
PH UA: 6
Protein, UA: NEGATIVE
RBC UA: NEGATIVE
SPEC GRAV UA: 1.02
Urobilinogen, UA: NEGATIVE

## 2016-06-16 LAB — LIPID PANEL
CHOL/HDL RATIO: 4
Cholesterol: 199 mg/dL (ref 0–200)
HDL: 48.2 mg/dL (ref >39.00)
LDL CALC: 132 mg/dL — AB (ref 0–99)
NONHDL: 150.96
Triglycerides: 94 mg/dL (ref 0.0–149.0)
VLDL: 18.8 mg/dL (ref 0.0–40.0)

## 2016-06-16 LAB — COMPREHENSIVE METABOLIC PANEL
ALK PHOS: 82 U/L (ref 39–117)
ALT: 20 U/L (ref 0–35)
AST: 23 U/L (ref 0–37)
Albumin: 3.8 g/dL (ref 3.5–5.2)
BILIRUBIN TOTAL: 0.3 mg/dL (ref 0.2–1.2)
BUN: 14 mg/dL (ref 6–23)
CALCIUM: 9.4 mg/dL (ref 8.4–10.5)
CO2: 27 meq/L (ref 19–32)
Chloride: 102 mEq/L (ref 96–112)
Creatinine, Ser: 0.5 mg/dL (ref 0.40–1.20)
GFR: 137.97 mL/min (ref >60.00)
GLUCOSE: 93 mg/dL (ref 70–99)
Potassium: 4 mEq/L (ref 3.5–5.1)
Sodium: 137 mEq/L (ref 135–145)
Total Protein: 7.6 g/dL (ref 6.0–8.3)

## 2016-06-16 LAB — TSH: TSH: 2.95 u[IU]/mL (ref 0.35–4.50)

## 2016-06-16 MED ORDER — ESOMEPRAZOLE MAGNESIUM 40 MG PO CPDR: 40.0000 mg | DELAYED_RELEASE_CAPSULE | Freq: Every day | ORAL | 3 refills | Status: DC

## 2016-06-16 NOTE — Patient Instructions (Signed)
Preventive Care 40-64 Years, Female Preventive care refers to lifestyle choices and visits with your health care provider that can promote health and wellness. What does preventive care include?  A yearly physical exam. This is also called an annual well check.  Dental exams once or twice a year.  Routine eye exams. Ask your health care provider how often you should have your eyes checked.  Personal lifestyle choices, including:  Daily care of your teeth and gums.  Regular physical activity.  Eating a healthy diet.  Avoiding tobacco and drug use.  Limiting alcohol use.  Practicing safe sex.  Taking low-dose aspirin daily starting at age 50.  Taking vitamin and mineral supplements as recommended by your health care provider. What happens during an annual well check? The services and screenings done by your health care provider during your annual well check will depend on your age, overall health, lifestyle risk factors, and family history of disease. Counseling  Your health care provider may ask you questions about your:  Alcohol use.  Tobacco use.  Drug use.  Emotional well-being.  Home and relationship well-being.  Sexual activity.  Eating habits.  Work and work environment.  Method of birth control.  Menstrual cycle.  Pregnancy history. Screening  You may have the following tests or measurements:  Height, weight, and BMI.  Blood pressure.  Lipid and cholesterol levels. These may be checked every 5 years, or more frequently if you are over 50 years old.  Skin check.  Lung cancer screening. You may have this screening every year starting at age 55 if you have a 30-pack-year history of smoking and currently smoke or have quit within the past 15 years.  Fecal occult blood test (FOBT) of the stool. You may have this test every year starting at age 50.  Flexible sigmoidoscopy or colonoscopy. You may have a sigmoidoscopy every 5 years or a colonoscopy  every 10 years starting at age 50.  Hepatitis C blood test.  Hepatitis B blood test.  Sexually transmitted disease (STD) testing.  Diabetes screening. This is done by checking your blood sugar (glucose) after you have not eaten for a while (fasting). You may have this done every 1-3 years.  Mammogram. This may be done every 1-2 years. Talk to your health care provider about when you should start having regular mammograms. This may depend on whether you have a family history of breast cancer.  BRCA-related cancer screening. This may be done if you have a family history of breast, ovarian, tubal, or peritoneal cancers.  Pelvic exam and Pap test. This may be done every 3 years starting at age 21. Starting at age 30, this may be done every 5 years if you have a Pap test in combination with an HPV test.  Bone density scan. This is done to screen for osteoporosis. You may have this scan if you are at high risk for osteoporosis. Discuss your test results, treatment options, and if necessary, the need for more tests with your health care provider. Vaccines  Your health care provider may recommend certain vaccines, such as:  Influenza vaccine. This is recommended every year.  Tetanus, diphtheria, and acellular pertussis (Tdap, Td) vaccine. You may need a Td booster every 10 years.  Varicella vaccine. You may need this if you have not been vaccinated.  Zoster vaccine. You may need this after age 60.  Measles, mumps, and rubella (MMR) vaccine. You may need at least one dose of MMR if you were born   in 1957 or later. You may also need a second dose.  Pneumococcal 13-valent conjugate (PCV13) vaccine. You may need this if you have certain conditions and were not previously vaccinated.  Pneumococcal polysaccharide (PPSV23) vaccine. You may need one or two doses if you smoke cigarettes or if you have certain conditions.  Meningococcal vaccine. You may need this if you have certain  conditions.  Hepatitis A vaccine. You may need this if you have certain conditions or if you travel or work in places where you may be exposed to hepatitis A.  Hepatitis B vaccine. You may need this if you have certain conditions or if you travel or work in places where you may be exposed to hepatitis B.  Haemophilus influenzae type b (Hib) vaccine. You may need this if you have certain conditions. Talk to your health care provider about which screenings and vaccines you need and how often you need them.     Influenza (Flu) Vaccine (Inactivated or Recombinant): What You Need to Know 1. Why get vaccinated? Influenza ("flu") is a contagious disease that spreads around the [place] every year, usually between October and May. Flu is caused by influenza viruses, and is spread mainly by coughing, sneezing, and close contact. Anyone can get flu. Flu strikes suddenly and can last several days. Symptoms vary by age, but can include:  fever/chills  sore throat  muscle aches  fatigue  cough  headache  runny or stuffy nose Flu can also lead to pneumonia and blood infections, and cause diarrhea and seizures in children. If you have a medical condition, such as heart or lung disease, flu can make it worse. Flu is more dangerous for some people. Infants and young children, people 22 years of age and older, pregnant women, and people with certain health conditions or a weakened immune system are at greatest risk. Each year thousands of people in the [place] die from flu, and many more are hospitalized. Flu vaccine can:  keep you from getting flu,  make flu less severe if you do get it, and  keep you from spreading flu to your family and other people. 2. Inactivated and recombinant flu vaccines A dose of flu vaccine is recommended every flu season. Children 6 months through 22 years of age may need two doses during the same flu season. Everyone else needs only one dose each flu  season. Some inactivated flu vaccines contain a very small amount of a mercury-based preservative called thimerosal. Studies have not shown thimerosal in vaccines to be harmful, but flu vaccines that do not contain thimerosal are available. There is no live flu virus in flu shots. They cannot cause the flu. There are many flu viruses, and they are always changing. Each year a new flu vaccine is made to protect against three or four viruses that are likely to cause disease in the upcoming flu season. But even when the vaccine doesn't exactly match these viruses, it may still provide some protection. Flu vaccine cannot prevent:  flu that is caused by a virus not covered by the vaccine, or  illnesses that look like flu but are not. It takes about 2 weeks for protection to develop after vaccination, and protection lasts through the flu season. 3. Some people should not get this vaccine Tell the person who is giving you the vaccine:  If you have any severe, life-threatening allergies. If you ever had a life-threatening allergic reaction after a dose of flu vaccine, or have a severe  allergy to any part of this vaccine, you may be advised not to get vaccinated. Most, but not all, types of flu vaccine contain a small amount of egg protein.  If you ever had Guillain-Barr Syndrome (also called GBS). Some people with a history of GBS should not get this vaccine. This should be discussed with your doctor.  If you are not feeling well. It is usually okay to get flu vaccine when you have a mild illness, but you might be asked to come back when you feel better. 4. Risks of a vaccine reaction With any medicine, including vaccines, there is a chance of reactions. These are usually mild and go away on their own, but serious reactions are also possible. Most people who get a flu shot do not have any problems with it. Minor problems following a flu shot include:  soreness, redness, or swelling where the shot was  given  hoarseness  sore, red or itchy eyes  cough  fever  aches  headache  itching  fatigue If these problems occur, they usually begin soon after the shot and last 1 or 2 days. More serious problems following a flu shot can include the following:  There may be a small increased risk of Guillain-Barre Syndrome (GBS) after inactivated flu vaccine. This risk has been estimated at 1 or 2 additional cases per million people vaccinated. This is much lower than the risk of severe complications from flu, which can be prevented by flu vaccine.  Young children who get the flu shot along with pneumococcal vaccine (PCV13) and/or DTaP vaccine at the same time might be slightly more likely to have a seizure caused by fever. Ask your doctor for more information. Tell your doctor if a child who is getting flu vaccine has ever had a seizure. Problems that could happen after any injected vaccine:  People sometimes faint after a medical procedure, including vaccination. Sitting or lying down for about 15 minutes can help prevent fainting, and injuries caused by a fall. Tell your doctor if you feel dizzy, or have vision changes or ringing in the ears.  Some people get severe pain in the shoulder and have difficulty moving the arm where a shot was given. This happens very rarely.  Any medication can cause a severe allergic reaction. Such reactions from a vaccine are very rare, estimated at about 1 in a million doses, and would happen within a few minutes to a few hours after the vaccination. As with any medicine, there is a very remote chance of a vaccine causing a serious injury or death. The safety of vaccines is always being monitored. For more information, visit: http://floyd.org/ 5. What if there is a serious reaction? What should I look for? Look for anything that concerns you, such as signs of a severe allergic reaction, very high fever, or unusual behavior. Signs of a severe allergic  reaction can include hives, swelling of the face and throat, difficulty breathing, a fast heartbeat, dizziness, and weakness. These would start a few minutes to a few hours after the vaccination. What should I do?  If you think it is a severe allergic reaction or other emergency that can't wait, call 9-1-1 and get the person to the nearest hospital. Otherwise, call your doctor.  Reactions should be reported to the Vaccine Adverse Event Reporting System (VAERS). Your doctor should file this report, or you can do it yourself through the VAERS web site at www.vaers.LAgents.no, or by calling 1-870-523-2810.  VAERS does not  give medical advice. 6. The National Vaccine Injury Compensation Program The [employer] Vaccine Injury Compensation Program (VICP) is a federal program that was created to compensate people who may have been injured by certain vaccines. Persons who believe they may have been injured by a vaccine can learn about the program and about filing a claim by calling (339)363-5593 or visiting the [employer] website at GoldCloset.com.ee. There is a time limit to file a claim for compensation. 7. How can I learn more?  Ask your healthcare provider. He or she can give you the vaccine package insert or suggest other sources of information.  Call your local or state health department.  Contact the Centers for Disease Control and Prevention (CDC):  Call 605 597 6026 (1-800-CDC-INFO) or  Visit CDC's website at https://gibson.com/ Vaccine Information Statement, Inactivated Influenza Vaccine (12/23/2013) This information is not intended to replace advice given to you by your health care provider. Make sure you discuss any questions you have with your health care provider. Document Released: 02/27/2006 Document Revised: 01/24/2016 Document Reviewed: 01/24/2016 Elsevier Interactive Patient Education  2017 [employer].  This information is not intended to replace advice given to you by your  health care provider. Make sure you discuss any questions you have with your health care provider. Document Released: 06/01/2015 Document Revised: 01/23/2016 Document Reviewed: 03/06/2015 Elsevier Interactive Patient Education  2017 [employer].

## 2016-06-16 NOTE — Progress Notes (Signed)
Pre visit review using our clinic review tool, if applicable. No additional management support is needed unless otherwise documented below in the visit note. Subjective:   I acted as a Neurosurgeon for Dr. Zola Button.  [name], CMA   Donna Lopez is a 51 y.o. female and is here for a comprehensive physical exam. The patient reports no problems.  Social History   Social History  . Marital status: Married    Spouse name: N/A  . Number of children: N/A  . Years of education: N/A   Occupational History  .  [employer]   Social History Main Topics  . Smoking status: Never Smoker  . Smokeless tobacco: Never Used  . Alcohol use No     Comment: rare alcohol use-once/twice a year  . Drug use: No  . Sexual activity: Yes    Partners: Male   Other Topics Concern  . Not on file   Social History Narrative   Exercise--- walks 1x a week   Health Maintenance  Topic Date Due  . INFLUENZA VACCINE  12/18/2015  . MAMMOGRAM  05/24/2016  . PAP SMEAR  06/16/2017 (Originally 04/23/2014)  . COLONOSCOPY  06/08/2017  . TETANUS/TDAP  06/13/2024  . 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 She  does not have any pertinent problems on file. She  has no past surgical history on file. Her family history includes Colon cancer in her paternal grandfather; Colon cancer (age of onset: 18) in her sister; Diabetes in her father; Kidney cancer in her father; Rectal cancer in her sister; Uterine cancer in her mother. She  reports that she has never smoked. She has never used smokeless tobacco. She reports that she does not drink alcohol or use drugs. She has a current medication list which includes the following prescription(s): acyclovir cream, aspirin, calcium + d3, esomeprazole, folic acid, humira  pen, l-lysine, methotrexate, misc natural products, and valacyclovir. Current Outpatient Prescriptions on File Prior to Visit  Medication Sig Dispense Refill  . acyclovir cream (ZOVIRAX) 5 % Apply 1 application topically every 3 (three) hours. 15 g 0  . aspirin 81 MG tablet Take 81 mg by mouth daily.    . Calcium Carb-Cholecalciferol (CALCIUM + D3) 600-200 MG-UNIT TABS Take 1 capsule by mouth daily.    Marland Kitchen esomeprazole (NEXIUM) 40 MG capsule Take 1 capsule (40 mg total) by mouth daily. 90 capsule 3  . folic acid (FOLVITE) 1 MG tablet Take 2 tablets (2 mg total) by mouth daily. 180 tablet 4  . HUMIRA PEN 40 MG/0.8ML PNKT INJECT THE CONTENTS OF 1 PEN UNDER THE SKIN EVERY OTHER WEEK 2 each 0  . L-Lysine 500 MG CAPS Take 1 capsule by mouth daily.    . methotrexate (RHEUMATREX) 2.5 MG tablet Take 8 tablets (20 mg total) by mouth once a week. Caution:Chemotherapy. Protect from light. 96 tablet 0  . Misc Natural Products (OSTEO BI-FLEX JOINT SHIELD PO) Take 2 tablets by mouth daily.    . valACYclovir (VALTREX) 1000 MG tablet 2 po x1 at first sign of cold sore then repeat in 12 h 30 tablet 2   No current facility-administered medications on file prior to visit.    She is allergic to penicillins..  Review of Systems Review of Systems  Constitutional: Negative for activity change,  appetite change and fatigue.  HENT: Negative for hearing loss, congestion, tinnitus and ear discharge.  dentist q32m Eyes: Negative for visual disturbance (see optho q1y -- vision corrected to 20/20 with glasses).  Respiratory: Negative for cough, chest tightness and shortness of breath.   Cardiovascular: Negative for chest pain, palpitations and leg swelling.  Gastrointestinal: Negative for abdominal pain, diarrhea, constipation and abdominal distention.  Genitourinary: Negative for urgency, frequency, decreased urine volume and difficulty urinating.  Musculoskeletal: Negative for back pain, arthralgias and gait problem.   Skin: Negative for color change, pallor and rash.  Neurological: Negative for dizziness, light-headedness, numbness and headaches.  Hematological: Negative for adenopathy. Does not bruise/bleed easily.  Psychiatric/Behavioral: Negative for suicidal ideas, confusion, sleep disturbance, self-injury, dysphoric mood, decreased concentration and agitation.       Objective:    BP 132/82 (BP Location: Right Arm, Cuff Size: Large)   Pulse 96   Temp 98.2 F (36.8 C) (Oral)   Resp 16   Ht 5' 2.8" (1.595 m)   Wt 257 lb 12.8 oz (116.9 kg)   LMP 05/19/2016   SpO2 97%   BMI 45.96 kg/m  General appearance: alert, cooperative, appears stated age and morbidly obese Head: Normocephalic, without obvious abnormality, atraumatic Eyes: conjunctivae/corneas clear. PERRL, EOM's intact. Fundi benign. Ears: normal TM's and external ear canals both ears Nose: Nares normal. Septum midline. Mucosa normal. No drainage or sinus tenderness. Throat: lips, mucosa, and tongue normal; teeth and gums normal Neck: no adenopathy, no carotid bruit, no JVD, supple, symmetrical, trachea midline and thyroid not enlarged, symmetric, no tenderness/mass/nodules Back: symmetric, no curvature. ROM normal. No CVA tenderness. Lungs: clear to auscultation bilaterally Breasts: normal appearance, no masses or tenderness Heart: regular rate and rhythm, S1, S2 normal, no murmur, click, rub or gallop Abdomen: soft, non-tender; bowel sounds normal; no masses,  no organomegaly Pelvic: deferred Extremities: extremities normal, atraumatic, no cyanosis or edema Pulses: 2+ and symmetric Skin: Skin color, texture, turgor normal. No rashes or lesions Lymph nodes: Cervical, supraclavicular, and axillary nodes normal. Neurologic: Alert and oriented X 3, normal strength and tone. Normal symmetric reflexes. Normal coordination and gait    Assessment:    Healthy female exam.     Plan:    to to f/u gyn-- for pap ghm utd-- except  pap Check labs See After Visit Summary for Counseling Recommendations    1. Influenza vaccine needed   - Flu Vaccine QUAD 36+ mos IM (Fluarix & Fluzone Quad PF  2. Gastroesophageal reflux disease, esophagitis presence not specified   - esomeprazole (NEXIUM) 40 MG capsule; Take 1 capsule (40 mg total) by mouth daily.  Dispense: 90 capsule; Refill: 3  3. Preventative health care See above - Comprehensive metabolic panel - CBC with Differential/Platelet - Lipid panel - TSH - POCT Urinalysis Dipstick (Automated)

## 2016-06-24 ENCOUNTER — Other Ambulatory Visit: Payer: Self-pay | Admitting: Family Medicine

## 2016-06-24 DIAGNOSIS — E785 Hyperlipidemia, unspecified: Secondary | ICD-10-CM

## 2016-06-25 ENCOUNTER — Ambulatory Visit (INDEPENDENT_AMBULATORY_CARE_PROVIDER_SITE_OTHER): Payer: BLUE CROSS/BLUE SHIELD | Admitting: Rheumatology

## 2016-06-25 ENCOUNTER — Encounter: Payer: Self-pay | Admitting: Rheumatology

## 2016-06-25 VITALS — BP 128/87 | HR 109 | Resp 15 | Ht 63.0 in | Wt 259.0 lb

## 2016-06-25 DIAGNOSIS — G8929 Other chronic pain: Secondary | ICD-10-CM

## 2016-06-25 DIAGNOSIS — M25561 Pain in right knee: Secondary | ICD-10-CM | POA: Diagnosis not present

## 2016-06-25 DIAGNOSIS — Z8739 Personal history of other diseases of the musculoskeletal system and connective tissue: Secondary | ICD-10-CM

## 2016-06-25 DIAGNOSIS — Z79899 Other long term (current) drug therapy: Secondary | ICD-10-CM

## 2016-06-25 DIAGNOSIS — M170 Bilateral primary osteoarthritis of knee: Secondary | ICD-10-CM | POA: Diagnosis not present

## 2016-06-25 DIAGNOSIS — M0579 Rheumatoid arthritis with rheumatoid factor of multiple sites without organ or systems involvement: Secondary | ICD-10-CM | POA: Diagnosis not present

## 2016-06-25 DIAGNOSIS — M25562 Pain in left knee: Secondary | ICD-10-CM | POA: Diagnosis not present

## 2016-06-25 NOTE — Progress Notes (Signed)
Office Visit Note  Patient: Donna Lopez             Date of Birth: 02/09/1966           MRN: [id number]             PCP: [name], DO Referring: [name], * Visit Date: 06/25/2016 Occupation: @GUAROCC @    Subjective:  Follow-up Rheumatoid arthritis and high-risk prescription  History of Present Illness: Donna Lopez is a 51 y.o. female  Doing well with her rheumatoid arthritis and high-risk prescription. No complaint except having knee discomfort.  Wants to get Visco supplementation. We will apply once again to the insurance company through Dr. Durward Fortes to get the Visco injections restarted. Had good results in the previous injections   Activities of Daily Living:  Patient reports morning stiffness for 15 minutes.   Patient Denies nocturnal pain.  Difficulty dressing/grooming: Denies Difficulty climbing stairs: Denies Difficulty getting out of chair: Denies Difficulty using hands for taps, buttons, cutlery, and/or writing: Denies   No Rheumatology ROS completed.   PMFS History:  Patient Active Problem List   Diagnosis Date Noted  . High risk medications (not anticoagulants) long-term use 06/25/2016  . History of degenerative disc disease 06/25/2016  . Severe obesity (BMI >= 40) ([place]) 06/13/2014  . Left-sided thoracic back pain 07/19/2013  . PALPITATIONS 10/17/2009  . UTI 05/24/2009  . Rheumatoid arthritis ([place]) 05/24/2009  . HAIR LOSS 02/22/2009  . VAGINAL BLEEDING 10/05/2008  . GROUP B STREPTOCOCCUS CARRIER 07/28/2007  . GERD 12/28/2006  . CARDIAC MURMUR, HX OF 12/28/2006  . HIATAL HERNIA, HX OF 12/28/2006    Past Medical History:  Diagnosis Date  . Arthritis   . GERD (gastroesophageal reflux disease)   . Heart murmur   . 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 Uterine cancer Mother   . Diabetes Father   . Kidney cancer Father   . Colon cancer Sister 22  . Rectal cancer Sister   .  Colon cancer Paternal Grandfather   . Esophageal cancer Neg Hx   . Stomach cancer Neg Hx    No past surgical history on file. Social History   Social History Narrative   Exercise--- walks 1x a week     Objective: Vital Signs: BP 128/87   Pulse (!) 109   Resp 15   Ht 5' 3"  (1.6 m)   Wt 259 lb (117.5 kg)   BMI 45.88 kg/m    Physical Exam   Musculoskeletal Exam:  Full range of motion of all joints Grip strength is equal and strong bilaterally Fibromyalgia tender points are all absent  CDAI Exam: No CDAI exam completed.  No synovitis on exam   Investigation: No additional findings. Office Visit on 06/16/2016  Component Date Value Ref Range Status  . Sodium 06/16/2016 137  135 - 145 mEq/L Final  . Potassium 06/16/2016 4.0  3.5 - 5.1 mEq/L Final  . Chloride 06/16/2016 102  96 - 112 mEq/L Final  . CO2 06/16/2016 27  19 - 32 mEq/L Final  . Glucose, Bld 06/16/2016 93  70 - 99 mg/dL Final  . BUN 06/16/2016 14  6 - 23 mg/dL Final  . Creatinine, Ser 06/16/2016 0.50  0.40 - 1.20 mg/dL Final  . Total Bilirubin 06/16/2016 0.3  0.2 - 1.2 mg/dL Final  . Alkaline Phosphatase 06/16/2016 82  39 - 117 U/L Final  . AST  06/16/2016 23  0 - 37 U/L Final  . ALT 06/16/2016 20  0 - 35 U/L Final  . Total Protein 06/16/2016 7.6  6.0 - 8.3 g/dL Final  . Albumin 06/16/2016 3.8  3.5 - 5.2 g/dL Final  . Calcium 06/16/2016 9.4  8.4 - 10.5 mg/dL Final  . GFR 06/16/2016 137.97  >60.00 mL/min Final  . WBC 06/16/2016 8.7  4.0 - 10.5 K/uL Final  . RBC 06/16/2016 4.37  3.87 - 5.11 Mil/uL Final  . Hemoglobin 06/16/2016 12.3  12.0 - 15.0 g/dL Final  . HCT 06/16/2016 37.9  36.0 - 46.0 % Final  . MCV 06/16/2016 86.6  78.0 - 100.0 fl Final  . MCHC 06/16/2016 32.5  30.0 - 36.0 g/dL Final  . RDW 06/16/2016 17.7* 11.5 - 15.5 % Final  . Platelets 06/16/2016 337.0  150.0 - 400.0 K/uL Final  . Neutrophils Relative % 06/16/2016 63.5  43.0 - 77.0 % Final  . Lymphocytes Relative 06/16/2016 24.9  12.0 - 46.0 %  Final  . Monocytes Relative 06/16/2016 8.3  3.0 - 12.0 % Final  . Eosinophils Relative 06/16/2016 2.5  0.0 - 5.0 % Final  . Basophils Relative 06/16/2016 0.8  0.0 - 3.0 % Final  . Neutro Abs 06/16/2016 5.5  1.4 - 7.7 K/uL Final  . Lymphs Abs 06/16/2016 2.2  0.7 - 4.0 K/uL Final  . Monocytes Absolute 06/16/2016 0.7  0.1 - 1.0 K/uL Final  . Eosinophils Absolute 06/16/2016 0.2  0.0 - 0.7 K/uL Final  . Basophils Absolute 06/16/2016 0.1  0.0 - 0.1 K/uL Final  . Cholesterol 06/16/2016 199  0 - 200 mg/dL Final  . Triglycerides 06/16/2016 94.0  0.0 - 149.0 mg/dL Final  . HDL 06/16/2016 48.20  >39.00 mg/dL Final  . VLDL 06/16/2016 18.8  0.0 - 40.0 mg/dL Final  . LDL Cholesterol 06/16/2016 132* 0 - 99 mg/dL Final  . Total CHOL/HDL Ratio 06/16/2016 4   Final  . NonHDL 06/16/2016 150.96   Final  . TSH 06/16/2016 2.95  0.35 - 4.50 uIU/mL Final  . Color, UA 06/16/2016 yellow   Final  . Clarity, UA 06/16/2016 clear   Final  . Glucose, UA 06/16/2016 neg   Final  . Bilirubin, UA 06/16/2016 neg   Final  . Ketones, UA 06/16/2016 neg   Final  . Spec Grav, UA 06/16/2016 1.020   Final  . Blood, UA 06/16/2016 neg   Final  . pH, UA 06/16/2016 6.0   Final  . Protein, UA 06/16/2016 neg   Final  . Urobilinogen, UA 06/16/2016 negative   Final  . Nitrite, UA 06/16/2016 neg   Final  . Leukocytes, UA 06/16/2016 Negative  Negative Final  Lab on 05/20/2016  Component Date Value Ref Range Status  . WBC 05/20/2016 8.7  3.8 - 10.8 K/uL Final  . RBC 05/20/2016 4.56  3.80 - 5.10 MIL/uL Final  . Hemoglobin 05/20/2016 12.6  11.7 - 15.5 g/dL Final  . HCT 05/20/2016 39.7  35.0 - 45.0 % Final  . MCV 05/20/2016 87.1  80.0 - 100.0 fL Final  . MCH 05/20/2016 27.6  27.0 - 33.0 pg Final  . MCHC 05/20/2016 31.7* 32.0 - 36.0 g/dL Final  . RDW 05/20/2016 17.5* 11.0 - 15.0 % Final  . Platelets 05/20/2016 319  140 - 400 K/uL Final  . MPV 05/20/2016 9.4  7.5 - 12.5 fL Final  . Neutro Abs 05/20/2016 5655  1,500 - 7,800 cells/uL  Final  . Lymphs Abs 05/20/2016 2001  850 - 3,900 cells/uL Final  . Monocytes Absolute 05/20/2016 870  200 - 950 cells/uL Final  . Eosinophils Absolute 05/20/2016 174  15 - 500 cells/uL Final  . Basophils Absolute 05/20/2016 0  0 - 200 cells/uL Final  . Neutrophils Relative % 05/20/2016 65  % Final  . Lymphocytes Relative 05/20/2016 23  % Final  . Monocytes Relative 05/20/2016 10  % Final  . Eosinophils Relative 05/20/2016 2  % Final  . Basophils Relative 05/20/2016 0  % Final  . Smear Review 05/20/2016 Criteria for review not met   Final  . Sodium 05/20/2016 138  135 - 146 mmol/L Final  . Potassium 05/20/2016 4.2  3.5 - 5.3 mmol/L Final  . Chloride 05/20/2016 104  98 - 110 mmol/L Final  . CO2 05/20/2016 25  20 - 31 mmol/L Final  . Glucose, Bld 05/20/2016 124* 65 - 99 mg/dL Final  . BUN 05/20/2016 18  7 - 25 mg/dL Final  . Creat 05/20/2016 0.54  0.50 - 1.05 mg/dL Final   Comment:   For patients > or = 52 years of age: The upper reference limit for Creatinine is approximately 13% higher for people identified as African-American.     . Total Bilirubin 05/20/2016 0.3  0.2 - 1.2 mg/dL Final  . Alkaline Phosphatase 05/20/2016 68  33 - 130 U/L Final  . AST 05/20/2016 15  10 - 35 U/L Final  . ALT 05/20/2016 12  6 - 29 U/L Final  . Total Protein 05/20/2016 7.0  6.1 - 8.1 g/dL Final  . Albumin 05/20/2016 3.7  3.6 - 5.1 g/dL Final  . Calcium 05/20/2016 8.9  8.6 - 10.4 mg/dL Final     Imaging: No results found.  Speciality Comments: No specialty comments available.    Procedures:  No procedures performed Allergies: Penicillins   Assessment / Plan:     Visit Diagnoses: Rheumatoid arthritis involving multiple sites with positive rheumatoid factor (HCC)  High risk medications (not anticoagulants) long-term use  History of degenerative disc disease   Plan: #1: Rheumatoid arthritis. History of erosive disease. Rheumatoid factor positive CCP positive. Doing really well with her  current treatment plan.  #2: High risk prescription. Humira every 2 weeks. Methotrexate 8 pills per week. Folic acid 2 mg per day. Adequate response.  #3: History of osteoarthritis of the knee joint. Patient is getting Visco supplementation from Dr. Durward Fortes and Mr. Petrarca. She'll make appointment for Visco supplementation through their office.  #4: Labs are up-to-date and normal.  #5 she'll need repeat CBC with differential CMP with GFR every 3 months. She knows to come to our office to get.  Orders: No orders of the defined types were placed in this encounter.  No orders of the defined types were placed in this encounter.   Face-to-face time spent with patient was 30 minutes. 50% of time was spent in counseling and coordination of care.  Follow-Up Instructions: No Follow-up on file.   Eliezer Lofts, PA-C  Note - This record has been created using [employer].  Chart creation errors have been sought, but may not always  have been located. Such creation errors do not reflect on  the standard of medical care.

## 2016-07-29 ENCOUNTER — Other Ambulatory Visit: Payer: Self-pay | Admitting: Rheumatology

## 2016-07-29 NOTE — Telephone Encounter (Signed)
Last Visit: 06/25/16 Next Visit: 11/21/16 Labs: 05/20/16 WNL TB Gold: 07/29/16 Neg Patient will update her TB Gold next week.  Okay to refill Humira?

## 2016-08-14 ENCOUNTER — Other Ambulatory Visit: Payer: Self-pay | Admitting: *Deleted

## 2016-08-14 DIAGNOSIS — Z79899 Other long term (current) drug therapy: Secondary | ICD-10-CM

## 2016-08-14 LAB — CBC WITH DIFFERENTIAL/PLATELET
BASOS ABS: 0 {cells}/uL (ref 0–200)
Basophils Relative: 0 %
EOS PCT: 2 %
Eosinophils Absolute: 190 cells/uL (ref 15–500)
HCT: 38.8 % (ref 35.0–45.0)
Hemoglobin: 12.4 g/dL (ref 11.7–15.5)
Lymphocytes Relative: 22 %
Lymphs Abs: 2090 cells/uL (ref 850–3900)
MCH: 27.6 pg (ref 27.0–33.0)
MCHC: 32 g/dL (ref 32.0–36.0)
MCV: 86.2 fL (ref 80.0–100.0)
MONOS PCT: 8 %
MPV: 9.7 fL (ref 7.5–12.5)
Monocytes Absolute: 760 cells/uL (ref 200–950)
NEUTROS PCT: 68 %
Neutro Abs: 6460 cells/uL (ref 1500–7800)
PLATELETS: 294 10*3/uL (ref 140–400)
RBC: 4.5 MIL/uL (ref 3.80–5.10)
RDW: 17.4 % — ABNORMAL HIGH (ref 11.0–15.0)
WBC: 9.5 10*3/uL (ref 3.8–10.8)

## 2016-08-14 LAB — COMPLETE METABOLIC PANEL WITH GFR
ALT: 15 U/L (ref 6–29)
AST: 15 U/L (ref 10–35)
Albumin: 3.4 g/dL — ABNORMAL LOW (ref 3.6–5.1)
Alkaline Phosphatase: 84 U/L (ref 33–130)
BILIRUBIN TOTAL: 0.3 mg/dL (ref 0.2–1.2)
BUN: 16 mg/dL (ref 7–25)
CO2: 24 mmol/L (ref 20–31)
CREATININE: 0.53 mg/dL (ref 0.50–1.05)
Calcium: 9 mg/dL (ref 8.6–10.4)
Chloride: 102 mmol/L (ref 98–110)
GFR, Est Non African American: >89 mL/min (ref >=60)
GLUCOSE: 116 mg/dL — AB (ref 65–99)
Potassium: 4.3 mmol/L (ref 3.5–5.3)
Sodium: 138 mmol/L (ref 135–146)
TOTAL PROTEIN: 7.1 g/dL (ref 6.1–8.1)

## 2016-09-22 ENCOUNTER — Other Ambulatory Visit: Payer: BLUE CROSS/BLUE SHIELD

## 2016-09-24 NOTE — Telephone Encounter (Signed)
IN FOLDER 

## 2016-09-25 ENCOUNTER — Other Ambulatory Visit: Payer: Self-pay | Admitting: Rheumatology

## 2016-09-25 NOTE — Telephone Encounter (Signed)
Last Visit: 06/25/16 Next Visit: 11/21/16 Labs: 08/14/16 : CMP with GFR is within normal limits except slight decrease in albumin at 3.4; nonfasting glucose is slightly elevated at 116. CBC- WNL TB Gold: 07/29/16 Neg  Okay to refill Humira?

## 2016-09-26 NOTE — Telephone Encounter (Signed)
Last Visit: 06/25/16 Next Visit: 11/21/16 Labs: 08/14/16 : CMP with GFR is within normal limits except slight decrease in albumin at 3.4; nonfasting glucose is slightly elevated at 116. CBC- WNL  Okay to refill MTX?

## 2016-10-03 ENCOUNTER — Telehealth (INDEPENDENT_AMBULATORY_CARE_PROVIDER_SITE_OTHER): Payer: Self-pay

## 2016-10-03 NOTE — Telephone Encounter (Signed)
Talked with patient and advised her that she needed to call her [facility] to set up a time for delivery of Euflexxa.  Patient stated that she will call [facility]. 

## 2016-10-03 NOTE — Telephone Encounter (Signed)
Talked with patient and advised her that she needed to call her [facility] to set up a time for delivery of Euflexxa.  Patient stated that she will call [facility].

## 2016-10-07 ENCOUNTER — Telehealth (INDEPENDENT_AMBULATORY_CARE_PROVIDER_SITE_OTHER): Payer: Self-pay

## 2016-10-07 NOTE — Telephone Encounter (Signed)
Called and left voicemail advising patient to return call concerning delivery date of Euflexxa.

## 2016-10-07 NOTE — Telephone Encounter (Signed)
Message made in error

## 2016-10-08 NOTE — Telephone Encounter (Signed)
Patient stated that she had spoken with [facility] to authorize delivery for Rx for Euflexxa.   I talked with [facility] pharmacy to setup delivery for Rx for Euflexxa.  Rx will be delivered on Friday 10/10/2016 to [facility].  Will send message for appointment to be scheduled for injections.

## 2016-10-08 NOTE — Telephone Encounter (Signed)
Please schedule patient appointment for Euflexxa injections after delivery of Euflexxa on Friday 10/10/16.  Thank You.

## 2016-10-23 ENCOUNTER — Encounter: Payer: Self-pay | Admitting: Nurse Practitioner

## 2016-10-23 ENCOUNTER — Ambulatory Visit (INDEPENDENT_AMBULATORY_CARE_PROVIDER_SITE_OTHER): Payer: BLUE CROSS/BLUE SHIELD | Admitting: Nurse Practitioner

## 2016-10-23 VITALS — BP 124/82 | HR 100 | Ht 62.0 in | Wt 253.0 lb

## 2016-10-23 DIAGNOSIS — Z800 Family history of malignant neoplasm of digestive organs: Secondary | ICD-10-CM

## 2016-10-23 DIAGNOSIS — K219 Gastro-esophageal reflux disease without esophagitis: Secondary | ICD-10-CM

## 2016-10-23 MED ORDER — RANITIDINE HCL 150 MG PO TABS: ORAL_TABLET | ORAL | 3 refills | Status: DC

## 2016-10-23 NOTE — Patient Instructions (Signed)
If you are age 52 or older, your body mass index should be between 23-30. Your Body mass index is 46.27 kg/m. If this is out of the aforementioned range listed, please consider follow up with your Primary Care Provider.  If you are age 106 or younger, your body mass index should be between 19-25. Your Body mass index is 46.27 kg/m. If this is out of the aformentioned range listed, please consider follow up with your Primary Care Provider.   We have sent the following medications to your pharmacy for you to pick up at your convenience: Zantac 150 mg Continue Nexium in the morning  You have been given GERD literature.  Please call PCP about something for post-nasal drip.  Call in 2-3 weeks with an update.  Thank you for choosing me and [facility].   Willette Cluster, NP

## 2016-10-23 NOTE — Progress Notes (Signed)
HPI:  Donna Lopez is a 51 year old female previously followed by Dr. Juanda Chance. She has a history of GERD and a family history of colon cancer in her sister at age 60. Recall colonoscopy is due 2019. Patient referred by PCP, Dr. Laury Axon for uncontrolled GERD. Patient has had GERD for at least 15 years. Her symptoms usually include burping and regurgitation, no heartburn. She's been on daily Nexium for years. approximately 6 months ago she developed a scratchy throat, sensation that something was sitting in her throat. She has stinging in her throat with spicy foods. No odynophagia or dysphagia. She had lost 30 pounds but regained all of it back over the last 1.5 years. She usually goes to bed on an empty stomach, the head of her bed cannot be elevated. She takes Nexium just before breakfast, can't take it 30 minutes before because it causes nausea. She does have sinus drainage in the back of her throat.. No other GI complaints.   Past Medical History:  Diagnosis Date  . Arthritis   . GERD (gastroesophageal reflux disease)   . Heart murmur   . 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 NO PAST SURGERIES     Family History  Problem Relation Age of Onset  . Uterine cancer Mother   . Diabetes Father   . Kidney cancer Father   . Colon cancer Sister 75  . Rectal cancer Sister   . Colon cancer Paternal Grandfather   . Esophageal cancer Neg Hx   . Stomach cancer Neg Hx    Social History  Substance Use Topics  . Smoking status: Never Smoker  . Smokeless tobacco: Never Used  . Alcohol use Yes     Comment: rare alcohol use-once/twice a year   Current Outpatient Prescriptions  Medication Sig Dispense Refill  . acyclovir cream (ZOVIRAX) 5 % Apply 1 application topically every 3 (three) hours. 15 g 0  . aspirin 81 MG tablet Take 81 mg by mouth daily.    . Calcium Carb-Cholecalciferol (CALCIUM + D3) 600-200 MG-UNIT TABS Take 1 capsule by mouth daily.    Marland Kitchen  esomeprazole (NEXIUM) 40 MG capsule Take 1 capsule (40 mg total) by mouth daily. 90 capsule 3  . folic acid (FOLVITE) 1 MG tablet Take 2 tablets (2 mg total) by mouth daily. 180 tablet 4  . HUMIRA PEN 40 MG/0.8ML PNKT INJECT THE CONTENTS OF 1 PEN UNDER THE SKIN EVERY OTHER WEEK 6 each 0  . L-Lysine 500 MG CAPS Take 1 capsule by mouth daily.    . methotrexate (RHEUMATREX) 2.5 MG tablet TAKE 8 TABLETS BY MOUTH ONCE A WEEK ON THE SAME DAY EVERY WEEK 96 tablet 0  . Misc Natural Products (OSTEO BI-FLEX JOINT SHIELD PO) Take 2 tablets by mouth daily.    . valACYclovir (VALTREX) 1000 MG tablet 2 po x1 at first sign of cold sore then repeat in 12 h 30 tablet 2   No current facility-administered medications for this visit.    Allergies  Allergen Reactions  . Penicillins     REACTION: HIVES     Review of Systems: Possible for allergy, sinus trouble, rheumatoid arthritis, cough All other systems reviewed and negative except where noted in HPI.   Physical Exam: BP 124/82   Pulse 100   Ht 5\' 2"  (1.575 m)   Wt 253 lb (114.8 kg)   LMP 09/22/2016   BMI 46.27 kg/m  Constitutional:  Obese white  female in no acute distress. Psychiatric: Normal mood and affect. Behavior is normal. EENT: Pupils normal.  Conjunctivae are normal. No scleral icterus. Neck supple.  Cardiovascular: Normal rate, regular rhythm. No edema Pulmonary/chest: Effort normal and breath sounds normal. No wheezing, rales or rhonchi. Abdominal: Soft, nondistended. Nontender. Bowel sounds active throughout. There are no masses palpable. . Lymphadenopathy: No cervical adenopathy noted. Neurological: Alert and oriented to person place and time. Skin: Skin is warm and dry. No rashes noted.   ASSESSMENT AND PLAN:  9. 51 year old female with long-standing GERD, on daily Nexium for years. Presenting with 6 month history of breakthrough GERD symptoms including burning in throat and globus sensation. She had lost 30 pounds but  unfortunately regained all of it back which is probably contributing to exacerbation of GERD symptoms. Regarding globus, she does have sinus drainage in the back of her throat which could certainly be contributing. She has some work-related anxiety but I do not get the impression that her symptoms are anxiety related. I suspect globus sensation is a combination of sinus drainage and reflux.  -GERD literature given -Encouraged weight loss -Insurance apparently will not pay for twice a day PPI. In that case she will continue the a.m. Nexium and add Zantac150 mgs in the evening. -I asked her to call Dr. Laury Axon to see what she can take for the post nasal drip -Patient will call me with a condition update in 2 or 3 weeks  2. Rheumatoid arthritis, on Humira and methotrexate. Normal LFTs late March  3. [facility] colon cancer in sister. Patient's surveillance colonoscopy is due next   Willette Cluster, NP  10/23/2016, 9:14 AM  Cc:  Zola Button, [place] Congress, *

## 2016-10-26 NOTE — Progress Notes (Signed)
Agree with assessment and plan as outlined. If adding Zantac does not help, she can try prilosec OTC and increase to BID for a trial if insurance will not cover twice daily PPI. If symptoms persist despite escalation in therapy would consider EGD. Otherwise please place a recall for Jan 2019 for her next colonoscopy (5 years from the date of her last exam given family history of colon cancer)

## 2016-11-17 DIAGNOSIS — Z872 Personal history of diseases of the skin and subcutaneous tissue: Secondary | ICD-10-CM | POA: Insufficient documentation

## 2016-11-17 DIAGNOSIS — M5134 Other intervertebral disc degeneration, thoracic region: Secondary | ICD-10-CM | POA: Insufficient documentation

## 2016-11-17 DIAGNOSIS — E559 Vitamin D deficiency, unspecified: Secondary | ICD-10-CM | POA: Insufficient documentation

## 2016-11-17 DIAGNOSIS — M5136 Other intervertebral disc degeneration, lumbar region: Secondary | ICD-10-CM | POA: Insufficient documentation

## 2016-11-17 NOTE — Progress Notes (Signed)
Office Visit Note  Patient: Donna Lopez             Date of Birth: 09-12-1965           MRN: [id number]             PCP: Donato Schultz, DO Referring: Donato Schultz, * Visit Date: 11/21/2016 Occupation: @GUAROCC @    Subjective:  Pain hands.   History of Present Illness: Donna Lopez is a 51 y.o. female with history of sero positive rheumatoid arthritis. She states she has intermittent joint pain which can move around. She's been having some discomfort in her hands. She denies any joint swelling. She has not had much discomfort in her thoracic and lumbar spine lately. She is tolerating her medications well.  Activities of Daily Living:  Patient reports morning stiffness for 10 minutes.   Patient Denies nocturnal pain.  Difficulty dressing/grooming: Denies Difficulty climbing stairs: Reports Difficulty getting out of chair: Denies Difficulty using hands for taps, buttons, cutlery, and/or writing: Denies   Review of Systems  Constitutional: Positive for fatigue. Negative for night sweats, weight gain, weight loss and weakness.  HENT: Negative for mouth sores, trouble swallowing, trouble swallowing, mouth dryness and nose dryness.   Eyes: Negative for pain, redness, visual disturbance and dryness.  Respiratory: Negative for cough, shortness of breath and difficulty breathing.   Cardiovascular: Negative for chest pain, palpitations, hypertension, irregular heartbeat and swelling in legs/feet.  Gastrointestinal: Negative for blood in stool, constipation and diarrhea.  Endocrine: Negative for increased urination.  Genitourinary: Negative for vaginal dryness.  Musculoskeletal: Positive for arthralgias, joint pain and morning stiffness. Negative for joint swelling, myalgias, muscle weakness, muscle tenderness and myalgias.  Skin: Negative for color change, rash, hair loss, skin tightness, ulcers and sensitivity to sunlight.  Allergic/Immunologic: Negative for  susceptible to infections.  Neurological: Negative for dizziness, memory loss and night sweats.  Hematological: Negative for swollen glands.  Psychiatric/Behavioral: Positive for sleep disturbance. Negative for depressed mood. The patient is not nervous/anxious.     PMFS History:  Patient Active Problem List   Diagnosis Date Noted  . 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 DDD (degenerative disc disease), lumbar 11/17/2016  . Vitamin D deficiency 11/17/2016  . History of rosacea/ History of acne 11/17/2016  . High risk medications (not anticoagulants) long-term use 06/25/2016  . History of degenerative disc disease 06/25/2016  . Severe obesity (BMI >= 40) (HCC) 06/13/2014  . Left-sided thoracic back pain 07/19/2013  . PALPITATIONS 10/17/2009  . UTI 05/24/2009  . Rheumatoid arthritis (HCC) 05/24/2009  . Hair loss 02/22/2009  . VAGINAL BLEEDING 10/05/2008  . Carrier of group B Streptococcus 07/28/2007  . GERD 12/28/2006  . CARDIAC MURMUR, HX OF 12/28/2006  . HIATAL HERNIA, HX OF 12/28/2006    Past Medical History:  Diagnosis Date  . Arthritis   . GERD (gastroesophageal reflux disease)   . Heart murmur   . Rheumatoid arthritis (HCC)   . 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 Uterine cancer Mother   . Diabetes Father   . Kidney cancer Father   . Colon cancer Sister 78  . Rectal cancer Sister   . Colon cancer Paternal Grandfather   . Esophageal cancer Neg Hx   . Stomach cancer Neg Hx    Past Surgical History:  Procedure Laterality Date  . NO PAST SURGERIES     Social History  Social History Narrative   Exercise--- walks 1x a week     Objective: Vital Signs: BP 135/82 (BP Location: Left Arm, Patient Position: Sitting, Cuff Size: Normal)   Pulse 100   Resp 16   Ht 5\' 3"  (1.6 m)   Wt 266 lb (120.7 kg)   LMP 08/18/2016   BMI 47.12 kg/m    Physical Exam  Constitutional: She is oriented to person, place, and  time. She appears well-developed and well-nourished.  HENT:  Head: Normocephalic and atraumatic.  Eyes: Conjunctivae and EOM are normal.  Neck: Normal range of motion.  Cardiovascular: Normal rate, regular rhythm, normal heart sounds and intact distal pulses.   Pulmonary/Chest: Effort normal and breath sounds normal.  Abdominal: Soft. Bowel sounds are normal.  Lymphadenopathy:    She has no cervical adenopathy.  Neurological: She is alert and oriented to person, place, and time.  Skin: Skin is warm and dry. Capillary refill takes less than 2 seconds.  Psychiatric: She has a normal mood and affect. Her behavior is normal.  Nursing note and vitals reviewed.    Musculoskeletal Exam: C-spine and thoracic spine good range of motion. Shoulder joints elbow joints wrist joints are good range of motion. She has synovial thickening over bilateral MCP joints she also has DIP PIP thickening no synovitis was noted. Hip joints knee joints ankles MTPs PIPs with good range of motion with no synovitis.  CDAI Exam: CDAI Homunculus Exam:   Joint Counts:  CDAI Tender Joint count: 0 CDAI Swollen Joint count: 0  Global Assessments:  Patient Global Assessment: 3 Provider Global Assessment: 3  CDAI Calculated Score: 6    Investigation: Findings:  TB gold neg 07/30/15   CBC Latest Ref Rng & Units 08/14/2016 06/16/2016 05/20/2016  WBC 3.8 - 10.8 K/uL 9.5 8.7 8.7  Hemoglobin 11.7 - 15.5 g/dL 07/18/2016 75.1 02.5  Hematocrit 35.0 - 45.0 % 38.8 37.9 39.7  Platelets 140 - 400 K/uL 294 337.0 319   CMP Latest Ref Rng & Units 08/14/2016 06/16/2016 05/20/2016  Glucose 65 - 99 mg/dL 07/18/2016) 93 778(E)  BUN 7 - 25 mg/dL 16 14 18   Creatinine 0.50 - 1.05 mg/dL 423(N 3.61  Sodium 135 - 146 mmol/L 138 137 138  Potassium 3.5 - 5.3 mmol/L 4.3 4.0 4.2  Chloride 98 - 110 mmol/L 102 102 104  CO2 20 - 31 mmol/L 24 27 25   Calcium 8.6 - 10.4 mg/dL 9.0 9.4 8.9  Total Protein 6.1 - 8.1 g/dL 7.1 7.6 7.0  Total Bilirubin 0.2 -  1.2 mg/dL 0.3 0.3 0.3  Alkaline Phos 33 - 130 U/L 84 82 68  AST 10 - 35 U/L 15 23 15   ALT 6 - 29 U/L 15 20 12     Imaging: No results found.  Speciality Comments: No specialty comments available.    Procedures:  No procedures performed Allergies: Penicillins   Assessment / Plan:     Visit Diagnoses: Rheumatoid arthritis involving multiple sites with positive rheumatoid factor (HCC) - Positive RF, positive anti-CCP, erosive disease. She does have some synovial thickening but no synovitis on examination today. She seems to be doing quite well on current combination of medications. She's been taking Humira only every 3 weeks.  High risk medications (not anticoagulants) long-term use - Methotrexate 8 tablets by mouth every week, folic acid 2 mg by mouth daily and Humira 40 mg subcutaneous every 3 weeks - Plan: Quantiferon tb gold assay (blood), CBC with Differential/Platelet, COMPLETE METABOLIC PANEL WITH GFR  DDD (degenerative disc disease), thoracic: The pain is tolerable DDD (degenerative disc disease), lumbar: Minimal discomfort  Severe obesity (BMI >= 40) (HCC): Weight loss diet and exercise discussed.  History of gastroesophageal reflux (GERD)  History of palpitations  Vitamin D deficiency: We'll check vitamin D level today.  History of rosacea/ History of acne  Association of heart disease with rheumatoid arthritis was discussed. Need to monitor blood pressure, cholesterol, and to exercise 30-60 minutes on daily basis was discussed. Poor dental hygiene can be a predisposing factor for rheumatoid arthritis. Good dental hygiene was discussed.  Orders: Orders Placed This Encounter  Procedures  . Quantiferon tb gold assay (blood)  . CBC with Differential/Platelet  . COMPLETE METABOLIC PANEL WITH GFR   No orders of the defined types were placed in this encounter.   Face-to-face time spent with patient was 30 minutes. 50% of time was spent in counseling and coordination of  care.  Follow-Up Instructions: Return in about 5 months (around 04/23/2017) for Rheumatoid arthritis.   Pollyann Savoy, MD  Note - This record has been created using Animal nutritionist.  Chart creation errors have been sought, but may not always  have been located. Such creation errors do not reflect on  the standard of medical care.

## 2016-11-21 ENCOUNTER — Ambulatory Visit (INDEPENDENT_AMBULATORY_CARE_PROVIDER_SITE_OTHER): Payer: BLUE CROSS/BLUE SHIELD | Admitting: Rheumatology

## 2016-11-21 ENCOUNTER — Encounter: Payer: Self-pay | Admitting: Rheumatology

## 2016-11-21 VITALS — BP 135/82 | HR 100 | Resp 16 | Ht 63.0 in | Wt 266.0 lb

## 2016-11-21 DIAGNOSIS — Z87898 Personal history of other specified conditions: Secondary | ICD-10-CM

## 2016-11-21 DIAGNOSIS — Z79899 Other long term (current) drug therapy: Secondary | ICD-10-CM

## 2016-11-21 DIAGNOSIS — M5136 Other intervertebral disc degeneration, lumbar region: Secondary | ICD-10-CM

## 2016-11-21 DIAGNOSIS — Z8719 Personal history of other diseases of the digestive system: Secondary | ICD-10-CM | POA: Diagnosis not present

## 2016-11-21 DIAGNOSIS — Z872 Personal history of diseases of the skin and subcutaneous tissue: Secondary | ICD-10-CM | POA: Diagnosis not present

## 2016-11-21 DIAGNOSIS — M5134 Other intervertebral disc degeneration, thoracic region: Secondary | ICD-10-CM

## 2016-11-21 DIAGNOSIS — E559 Vitamin D deficiency, unspecified: Secondary | ICD-10-CM

## 2016-11-21 DIAGNOSIS — M0579 Rheumatoid arthritis with rheumatoid factor of multiple sites without organ or systems involvement: Secondary | ICD-10-CM | POA: Diagnosis not present

## 2016-11-21 LAB — COMPLETE METABOLIC PANEL WITH GFR
ALT: 12 U/L (ref 6–29)
AST: 15 U/L (ref 10–35)
Albumin: 3.6 g/dL (ref 3.6–5.1)
Alkaline Phosphatase: 82 U/L (ref 33–130)
BUN: 15 mg/dL (ref 7–25)
CO2: 24 mmol/L (ref 20–31)
Calcium: 8.6 mg/dL (ref 8.6–10.4)
Chloride: 104 mmol/L (ref 98–110)
Creat: 0.56 mg/dL (ref 0.50–1.05)
GFR, Est African American: >89 mL/min (ref >=60)
GFR, Est Non African American: >89 mL/min (ref >=60)
Glucose, Bld: 113 mg/dL — ABNORMAL HIGH (ref 65–99)
Potassium: 4.2 mmol/L (ref 3.5–5.3)
Sodium: 137 mmol/L (ref 135–146)
Total Bilirubin: 0.3 mg/dL (ref 0.2–1.2)
Total Protein: 7 g/dL (ref 6.1–8.1)

## 2016-11-21 LAB — CBC WITH DIFFERENTIAL/PLATELET
Basophils Absolute: 0 cells/uL (ref 0–200)
Basophils Relative: 0 %
Eosinophils Absolute: 270 cells/uL (ref 15–500)
Eosinophils Relative: 3 %
HCT: 38.4 % (ref 35.0–45.0)
Hemoglobin: 12.4 g/dL (ref 11.7–15.5)
Lymphocytes Relative: 23 %
Lymphs Abs: 2070 cells/uL (ref 850–3900)
MCH: 28.1 pg (ref 27.0–33.0)
MCHC: 32.3 g/dL (ref 32.0–36.0)
MCV: 86.9 fL (ref 80.0–100.0)
MPV: 9.5 fL (ref 7.5–12.5)
Monocytes Absolute: 900 cells/uL (ref 200–950)
Monocytes Relative: 10 %
Neutro Abs: 5760 cells/uL (ref 1500–7800)
Neutrophils Relative %: 64 %
Platelets: 322 10*3/uL (ref 140–400)
RBC: 4.42 MIL/uL (ref 3.80–5.10)
RDW: 17.8 % — ABNORMAL HIGH (ref 11.0–15.0)
WBC: 9 10*3/uL (ref 3.8–10.8)

## 2016-11-21 NOTE — Patient Instructions (Signed)
Standing Labs We placed an order today for your standing lab work.    Please come back and get your standing labs in October and every 3 months  We have open lab Monday through Friday from 8:30-11:30 AM and 1:30-4 PM at the office of Dr. Bernadette Armijo.   The office is located at [address], [address], [place], [place] [address] No appointment is necessary.   Labs are drawn by [facility].  You may receive a bill from [facility] for your lab work. If you have any questions regarding directions or hours of operation,  please call [phone].     Association of heart disease with rheumatoid arthritis was discussed. Need to monitor blood pressure, cholesterol, and to exercise 30-60 minutes on daily basis was discussed. Poor dental hygiene can be a predisposing factor for rheumatoid arthritis. Good dental hygiene was discussed. 

## 2016-11-22 LAB — VITAMIN D 25 HYDROXY (VIT D DEFICIENCY, FRACTURES): VIT D 25 HYDROXY: 39 ng/mL (ref 30–100)

## 2016-11-24 LAB — QUANTIFERON TB GOLD ASSAY (BLOOD)
INTERFERON GAMMA RELEASE ASSAY: NEGATIVE
Mitogen-Nil: >10 IU/mL
Quantiferon Nil Value: 0.04 IU/mL

## 2016-11-24 NOTE — Progress Notes (Signed)
Vit D normal. Vit D 2000U OTC and check level in 6 months.

## 2016-11-28 DIAGNOSIS — J019 Acute sinusitis, unspecified: Secondary | ICD-10-CM | POA: Diagnosis not present

## 2016-11-28 DIAGNOSIS — J3489 Other specified disorders of nose and nasal sinuses: Secondary | ICD-10-CM | POA: Diagnosis not present

## 2016-12-16 ENCOUNTER — Other Ambulatory Visit: Payer: Self-pay | Admitting: Rheumatology

## 2016-12-16 NOTE — Telephone Encounter (Signed)
Last Visit: 11/21/16 Next Visit: 04/24/17 Labs: 11/21/16 WNL  Okay to refill per Dr. Corliss Skains

## 2016-12-24 DIAGNOSIS — M170 Bilateral primary osteoarthritis of knee: Secondary | ICD-10-CM | POA: Insufficient documentation

## 2016-12-24 NOTE — Progress Notes (Deleted)
   Procedure Note  Patient: Donna Lopez             Date of Birth: 1965-08-21           MRN: [id number]             Visit Date: 12/31/2016  Procedures: Visit Diagnoses: Primary osteoarthritis of both knees Euflexxa #1 bilateral knees  No procedures performed

## 2016-12-26 NOTE — Progress Notes (Signed)
   Procedure Note  Patient: Donna Lopez             Date of Birth: 04/16/1966           MRN: [id number]             Visit Date: 01/05/2017  Procedures: Visit Diagnoses: Primary osteoarthritis of both knees Euflexxa #2 bilateral knees (patient purchased meds) Large Joint Inj Date/Time: 01/05/2017 10:46 AM Performed by: Pollyann Savoy Authorized by: Pollyann Savoy   Consent Given by:  Patient Site marked: the procedure site was marked   Timeout: prior to procedure the correct patient, procedure, and site was verified   Indications:  Pain and joint swelling Location:  Knee Site:  R knee Prep: patient was prepped and draped in usual sterile fashion   Needle Size:  27 G Needle Length:  1.5 inches Approach:  Medial Ultrasound Guidance: No   Fluoroscopic Guidance: No   Arthrogram: No   Medications:  1.5 mL lidocaine 1 %; 20 mg Sodium Hyaluronate 20 MG/2ML Aspiration Attempted: Yes   Aspirate amount (mL):  0 Patient tolerance:  Patient tolerated the procedure well with no immediate complications Large Joint Inj Date/Time: 01/05/2017 10:47 AM Performed by: Pollyann Savoy Authorized by: Pollyann Savoy   Consent Given by:  Patient Site marked: the procedure site was marked   Timeout: prior to procedure the correct patient, procedure, and site was verified   Indications:  Pain and joint swelling Location:  Knee Site:  L knee Prep: patient was prepped and draped in usual sterile fashion   Needle Size:  27 G Needle Length:  1.5 inches Approach:  Medial Ultrasound Guidance: No   Fluoroscopic Guidance: No   Arthrogram: No   Medications:  1.5 mL lidocaine 1 %; 20 mg Sodium Hyaluronate 20 MG/2ML Aspiration Attempted: Yes   Aspirate amount (mL):  0 Patient tolerance:  Patient tolerated the procedure well with no immediate complications    Pollyann Savoy, MD

## 2016-12-29 ENCOUNTER — Ambulatory Visit (INDEPENDENT_AMBULATORY_CARE_PROVIDER_SITE_OTHER): Payer: BLUE CROSS/BLUE SHIELD | Admitting: Rheumatology

## 2016-12-29 ENCOUNTER — Ambulatory Visit: Payer: BLUE CROSS/BLUE SHIELD | Admitting: Rheumatology

## 2016-12-29 DIAGNOSIS — M1712 Unilateral primary osteoarthritis, left knee: Secondary | ICD-10-CM | POA: Diagnosis not present

## 2016-12-29 DIAGNOSIS — M170 Bilateral primary osteoarthritis of knee: Secondary | ICD-10-CM

## 2016-12-29 DIAGNOSIS — M1711 Unilateral primary osteoarthritis, right knee: Secondary | ICD-10-CM | POA: Diagnosis not present

## 2016-12-29 MED ORDER — LIDOCAINE HCL 1 % IJ SOLN
1.5000 mL | INTRAMUSCULAR | Status: AC | PRN
  Administered 2016-12-29: 1.5 mL

## 2016-12-29 MED ORDER — SODIUM HYALURONATE (VISCOSUP) 20 MG/2ML IX SOSY
20.0000 mg | PREFILLED_SYRINGE | INTRA_ARTICULAR | Status: AC | PRN
  Administered 2016-12-29: 20 mg via INTRA_ARTICULAR

## 2016-12-29 NOTE — Progress Notes (Signed)
   Procedure Note  Patient: Donna Lopez             Date of Birth: 1965-09-03           MRN: [id number]             Visit Date: 12/29/2016 Euflexxa bilateral knee joints Procedures: Visit Diagnoses: Osteoarthritis knee joints  Large Joint Inj Date/Time: 12/29/2016 9:11 AM Performed by: Pollyann Savoy Authorized by: Pollyann Savoy   Consent Given by:  Patient Site marked: the procedure site was marked   Timeout: prior to procedure the correct patient, procedure, and site was verified   Indications:  Pain and joint swelling Location:  Knee Site:  R knee Prep: patient was prepped and draped in usual sterile fashion   Needle Size:  27 G Needle Length:  1.5 inches Approach:  Medial Ultrasound Guidance: No   Fluoroscopic Guidance: No   Arthrogram: No   Medications:  1.5 mL lidocaine 1 %; 20 mg Sodium Hyaluronate 20 MG/2ML Aspiration Attempted: Yes   Aspirate amount (mL):  0 Patient tolerance:  Patient tolerated the procedure well with no immediate complications Large Joint Inj Date/Time: 12/29/2016 9:12 AM Performed by: Pollyann Savoy Authorized by: Pollyann Savoy   Consent Given by:  Patient Site marked: the procedure site was marked   Timeout: prior to procedure the correct patient, procedure, and site was verified   Indications:  Pain and joint swelling Location:  Knee Prep: patient was prepped and draped in usual sterile fashion   Needle Size:  27 G Needle Length:  1.5 inches Approach:  Medial Ultrasound Guidance: No   Fluoroscopic Guidance: No   Arthrogram: No   Medications:  20 mg Sodium Hyaluronate 20 MG/2ML; 1.5 mL lidocaine 1 % Aspiration Attempted: Yes   Aspirate amount (mL):  0 Patient tolerance:  Patient tolerated the procedure well with no immediate complications   Pollyann Savoy, MD

## 2016-12-30 ENCOUNTER — Telehealth: Payer: Self-pay | Admitting: Family Medicine

## 2016-12-30 NOTE — Telephone Encounter (Signed)
Pt dropped off a health care provider form for Dr. Laury Axon to fill out, documents placed in tray at front office

## 2016-12-31 ENCOUNTER — Ambulatory Visit: Payer: BLUE CROSS/BLUE SHIELD | Admitting: Rheumatology

## 2017-01-01 NOTE — Telephone Encounter (Signed)
Completed as much as possible on form, LMOM for return call to give waist circumference; forwarded to provider/SLS 08/16

## 2017-01-02 NOTE — Telephone Encounter (Incomplete)
Paperwork completed and faxed to [facility] [email or web address] [phone];

## 2017-01-02 NOTE — Telephone Encounter (Signed)
Patient returned call waist circumstances is 62

## 2017-01-02 NOTE — Telephone Encounter (Signed)
Pt called and I advised her paperwork has been faxed to [facility]-

## 2017-01-05 ENCOUNTER — Ambulatory Visit: Payer: BLUE CROSS/BLUE SHIELD | Admitting: Rheumatology

## 2017-01-05 ENCOUNTER — Ambulatory Visit (INDEPENDENT_AMBULATORY_CARE_PROVIDER_SITE_OTHER): Payer: BLUE CROSS/BLUE SHIELD | Admitting: Rheumatology

## 2017-01-05 DIAGNOSIS — M170 Bilateral primary osteoarthritis of knee: Secondary | ICD-10-CM

## 2017-01-05 DIAGNOSIS — M1712 Unilateral primary osteoarthritis, left knee: Secondary | ICD-10-CM | POA: Diagnosis not present

## 2017-01-05 DIAGNOSIS — M1711 Unilateral primary osteoarthritis, right knee: Secondary | ICD-10-CM

## 2017-01-05 MED ORDER — SODIUM HYALURONATE (VISCOSUP) 20 MG/2ML IX SOSY
20.0000 mg | PREFILLED_SYRINGE | INTRA_ARTICULAR | Status: AC | PRN
  Administered 2017-01-05: 20 mg via INTRA_ARTICULAR

## 2017-01-05 MED ORDER — LIDOCAINE HCL 1 % IJ SOLN
1.5000 mL | INTRAMUSCULAR | Status: AC | PRN
  Administered 2017-01-05: 1.5 mL

## 2017-01-06 NOTE — Progress Notes (Signed)
   Procedure Note  Patient: Donna Lopez             Date of Birth: 02-28-1966           MRN: [id number]             Visit Date: 01/12/2017  Procedures: Visit Diagnoses: Primary osteoarthritis of both knees Euflexxa #3 Bilateral (patient purchased) Large Joint Inj Date/Time: 01/12/2017 8:37 AM Performed by: Pollyann Savoy Authorized by: Pollyann Savoy   Consent Given by:  Patient Site marked: the procedure site was marked   Timeout: prior to procedure the correct patient, procedure, and site was verified   Indications:  Pain Location:  Knee Site:  R knee Prep: patient was prepped and draped in usual sterile fashion   Needle Size:  27 G Needle Length:  1.5 inches Ultrasound Guidance: No   Fluoroscopic Guidance: No   Arthrogram: No   Medications:  20 mg Sodium Hyaluronate 20 MG/2ML; 1.5 mL lidocaine 1 % Aspiration Attempted: Yes   Patient tolerance:  Patient tolerated the procedure well with no immediate complications Large Joint Inj Date/Time: 01/12/2017 8:39 AM Performed by: Pollyann Savoy Authorized by: Pollyann Savoy   Consent Given by:  Patient Site marked: the procedure site was marked   Timeout: prior to procedure the correct patient, procedure, and site was verified   Indications:  Pain Location:  Knee Prep: patient was prepped and draped in usual sterile fashion   Needle Size:  27 G Needle Length:  1.5 inches Ultrasound Guidance: No   Fluoroscopic Guidance: No   Arthrogram: No   Medications:  20 mg Sodium Hyaluronate 20 MG/2ML; 1.5 mL lidocaine 1 % Aspiration Attempted: Yes   Patient tolerance:  Patient tolerated the procedure well with no immediate complications   Pollyann Savoy, MD

## 2017-01-12 ENCOUNTER — Ambulatory Visit: Payer: BLUE CROSS/BLUE SHIELD | Admitting: Rheumatology

## 2017-01-12 ENCOUNTER — Ambulatory Visit (INDEPENDENT_AMBULATORY_CARE_PROVIDER_SITE_OTHER): Payer: BLUE CROSS/BLUE SHIELD | Admitting: Rheumatology

## 2017-01-12 DIAGNOSIS — M1711 Unilateral primary osteoarthritis, right knee: Secondary | ICD-10-CM | POA: Diagnosis not present

## 2017-01-12 DIAGNOSIS — M1712 Unilateral primary osteoarthritis, left knee: Secondary | ICD-10-CM | POA: Diagnosis not present

## 2017-01-12 DIAGNOSIS — M170 Bilateral primary osteoarthritis of knee: Secondary | ICD-10-CM

## 2017-01-12 MED ORDER — LIDOCAINE HCL 1 % IJ SOLN
1.5000 mL | INTRAMUSCULAR | Status: AC | PRN
  Administered 2017-01-12: 1.5 mL

## 2017-01-12 MED ORDER — SODIUM HYALURONATE (VISCOSUP) 20 MG/2ML IX SOSY
20.0000 mg | PREFILLED_SYRINGE | INTRA_ARTICULAR | Status: AC | PRN
  Administered 2017-01-12: 20 mg via INTRA_ARTICULAR

## 2017-01-13 DIAGNOSIS — J3489 Other specified disorders of nose and nasal sinuses: Secondary | ICD-10-CM | POA: Diagnosis not present

## 2017-01-23 DIAGNOSIS — L308 Other specified dermatitis: Secondary | ICD-10-CM | POA: Diagnosis not present

## 2017-01-23 DIAGNOSIS — J3489 Other specified disorders of nose and nasal sinuses: Secondary | ICD-10-CM | POA: Diagnosis not present

## 2017-03-02 DIAGNOSIS — J3489 Other specified disorders of nose and nasal sinuses: Secondary | ICD-10-CM | POA: Diagnosis not present

## 2017-03-02 DIAGNOSIS — H6983 Other specified disorders of Eustachian tube, bilateral: Secondary | ICD-10-CM | POA: Diagnosis not present

## 2017-03-11 ENCOUNTER — Other Ambulatory Visit: Payer: Self-pay | Admitting: Rheumatology

## 2017-03-11 NOTE — Telephone Encounter (Signed)
Last Visit: 11/21/16 Next Visit: 04/24/17 Labs: 11/21/16 WNL  Patient reminded she is due for labs and will have those completed in the next week.   Okay to refill 30 day supply per Dr. Corliss Skains

## 2017-03-30 ENCOUNTER — Other Ambulatory Visit: Payer: Self-pay | Admitting: *Deleted

## 2017-03-30 DIAGNOSIS — Z79899 Other long term (current) drug therapy: Secondary | ICD-10-CM

## 2017-03-30 LAB — CBC WITH DIFFERENTIAL/PLATELET
BASOS PCT: 0.5 %
Basophils Absolute: 40 cells/uL (ref 0–200)
EOS PCT: 2.1 %
Eosinophils Absolute: 168 cells/uL (ref 15–500)
HCT: 38.3 % (ref 35.0–45.0)
Hemoglobin: 12.9 g/dL (ref 11.7–15.5)
LYMPHS ABS: 1936 {cells}/uL (ref 850–3900)
MCH: 28.2 pg (ref 27.0–33.0)
MCHC: 33.7 g/dL (ref 32.0–36.0)
MCV: 83.8 fL (ref 80.0–100.0)
MPV: 9.7 fL (ref 7.5–12.5)
Monocytes Relative: 9.8 %
Neutro Abs: 5072 cells/uL (ref 1500–7800)
Neutrophils Relative %: 63.4 %
PLATELETS: 297 10*3/uL (ref 140–400)
RBC: 4.57 10*6/uL (ref 3.80–5.10)
RDW: 16.3 % — ABNORMAL HIGH (ref 11.0–15.0)
TOTAL LYMPHOCYTE: 24.2 %
WBC: 8 10*3/uL (ref 3.8–10.8)
WBCMIX: 784 {cells}/uL (ref 200–950)

## 2017-03-30 LAB — COMPLETE METABOLIC PANEL WITH GFR
AG Ratio: 1.2 (calc) (ref 1.0–2.5)
ALKALINE PHOSPHATASE (APISO): 73 U/L (ref 33–130)
ALT: 24 U/L (ref 6–29)
AST: 26 U/L (ref 10–35)
Albumin: 3.9 g/dL (ref 3.6–5.1)
BILIRUBIN TOTAL: 0.3 mg/dL (ref 0.2–1.2)
BUN: 18 mg/dL (ref 7–25)
CHLORIDE: 102 mmol/L (ref 98–110)
CO2: 30 mmol/L (ref 20–32)
CREATININE: 0.55 mg/dL (ref 0.50–1.05)
Calcium: 9.2 mg/dL (ref 8.6–10.4)
GFR, Est African American: 125 mL/min/{1.73_m2} (ref >=60)
GFR, Est Non African American: 108 mL/min/{1.73_m2} (ref >=60)
GLUCOSE: 103 mg/dL — AB (ref 65–99)
Globulin: 3.3 g/dL (calc) (ref 1.9–3.7)
Potassium: 4.3 mmol/L (ref 3.5–5.3)
SODIUM: 138 mmol/L (ref 135–146)
Total Protein: 7.2 g/dL (ref 6.1–8.1)

## 2017-03-31 NOTE — Progress Notes (Signed)
Within normal limits

## 2017-04-05 ENCOUNTER — Other Ambulatory Visit: Payer: Self-pay | Admitting: Rheumatology

## 2017-04-06 NOTE — Telephone Encounter (Signed)
Last Visit: 11/21/16 Next Visit: 04/24/17 Labs: 03/30/17 WNL  Okay to refill per Dr. Corliss Skains

## 2017-04-11 NOTE — Progress Notes (Deleted)
Office Visit Note  Patient: Donna Lopez             Date of Birth: 06/03/1965           MRN: [id number]             PCP: Donato Schultz, DO Referring: Donato Schultz, * Visit Date: 04/24/2017 Occupation: @GUAROCC @    Subjective:  No chief complaint on file.   History of Present Illness: Donna Lopez is a 51 y.o. female ***   Activities of Daily Living:  Patient reports morning stiffness for *** {minute/hour:19697}.   Patient {ACTIONS;DENIES/REPORTS:21021675::"Denies"} nocturnal pain.  Difficulty dressing/grooming: {ACTIONS;DENIES/REPORTS:21021675::"Denies"} Difficulty climbing stairs: {ACTIONS;DENIES/REPORTS:21021675::"Denies"} Difficulty getting out of chair: {ACTIONS;DENIES/REPORTS:21021675::"Denies"} Difficulty using hands for taps, buttons, cutlery, and/or writing: {ACTIONS;DENIES/REPORTS:21021675::"Denies"}   No Rheumatology ROS completed.   PMFS History:  Patient Active Problem List   Diagnosis Date Noted  . Primary osteoarthritis of both knees 12/24/2016  . 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 DDD (degenerative disc disease), lumbar 11/17/2016  . Vitamin D deficiency 11/17/2016  . History of rosacea/ History of acne 11/17/2016  . High risk medications (not anticoagulants) long-term use 06/25/2016  . History of degenerative disc disease 06/25/2016  . Severe obesity (BMI >= 40) (HCC) 06/13/2014  . Left-sided thoracic back pain 07/19/2013  . PALPITATIONS 10/17/2009  . UTI 05/24/2009  . Rheumatoid arthritis (HCC) 05/24/2009  . Hair loss 02/22/2009  . VAGINAL BLEEDING 10/05/2008  . Carrier of group B Streptococcus 07/28/2007  . GERD 12/28/2006  . CARDIAC MURMUR, HX OF 12/28/2006  . HIATAL HERNIA, HX OF 12/28/2006    Past Medical History:  Diagnosis Date  . Arthritis   . GERD (gastroesophageal reflux disease)   . Heart murmur   . Rheumatoid arthritis (HCC)   . 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     Family History  Problem  Relation Age of Onset  . Uterine cancer Mother   . Diabetes Father   . Kidney cancer Father   . Colon cancer Sister 65  . Rectal cancer Sister   . Colon cancer Paternal Grandfather   . Esophageal cancer Neg Hx   . Stomach cancer Neg Hx    Past Surgical History:  Procedure Laterality Date  . NO PAST SURGERIES     Social History   Social History Narrative   Exercise--- walks 1x a week     Objective: Vital Signs: There were no vitals taken for this visit.   Physical Exam   Musculoskeletal Exam: ***  CDAI Exam: No CDAI exam completed.    Investigation: No additional findings.TB Gold: 11/21/2016 Negative  CBC Latest Ref Rng & Units 03/30/2017 11/21/2016 08/14/2016  WBC 3.8 - 10.8 Thousand/uL 8.0 9.0 9.5  Hemoglobin 11.7 - 15.5 g/dL 08/16/2016 30.1 60.1  Hematocrit 35.0 - 45.0 % 38.3 38.4 38.8  Platelets 140 - 400 Thousand/uL 297 322 294   CMP Latest Ref Rng & Units 03/30/2017 11/21/2016 08/14/2016  Glucose 65 - 99 mg/dL 08/16/2016) 235(T) 732(K)  BUN 7 - 25 mg/dL 18 15 16   Creatinine 0.50 - 1.05 mg/dL 025(K 2.70  Sodium 135 - 146 mmol/L 138 137 138  Potassium 3.5 - 5.3 mmol/L 4.3 4.2 4.3  Chloride 98 - 110 mmol/L 102 104 102  CO2 20 - 32 mmol/L 30 24 24   Calcium 8.6 - 10.4 mg/dL 9.2 8.6 9.0  Total Protein 6.1 - 8.1 g/dL 7.2 7.0 7.1  Total Bilirubin 0.2 - 1.2 mg/dL 0.3 0.3 0.3  Alkaline  Phos 33 - 130 U/L - 82 84  AST 10 - 35 U/L 26 15 15   ALT 6 - 29 U/L 24 12 15     Imaging: No results found.  Speciality Comments: No specialty comments available.    Procedures:  No procedures performed Allergies: Penicillins   Assessment / Plan:     Visit Diagnoses: No diagnosis found.    Orders: No orders of the defined types were placed in this encounter.  No orders of the defined types were placed in this encounter.   Face-to-face time spent with patient was *** minutes. 50% of time was spent in counseling and coordination of care.  Follow-Up Instructions: No Follow-up on  file.   , CMA  Note - This record has been created using .  Chart creation errors have been sought, but may not always  have been located. Such creation errors do not reflect on  the standard of medical care.

## 2017-04-24 ENCOUNTER — Ambulatory Visit: Payer: BLUE CROSS/BLUE SHIELD | Admitting: Rheumatology

## 2017-05-07 NOTE — Progress Notes (Signed)
Office Visit Note  Patient: Donna Lopez             Date of Birth: 07-Apr-1966           MRN: [id number]             PCP: Donato Schultz, DO Referring: Donato Schultz, * Visit Date: 05/13/2017 Occupation: @GUAROCC @    Subjective:  Joint stiffness   History of Present Illness: Donna Lopez is a 51 y.o. female with history of rheumatoid arthritis and osteoarthritis overlap. She states she has a space to Humira to every 3 weeks now. She's also taking methotrexate 8 tablets per week. She has not noticed any flare. She has not noticed any increase arthralgias on his pacing Humira dosage. She continues to have some discomfort in her knee joints. The back pain is tolerable currently.  Activities of Daily Living:  Patient reports morning stiffness for 10 minutes.   Patient Denies nocturnal pain.  Difficulty dressing/grooming: Denies Difficulty climbing stairs: Reports Difficulty getting out of chair: Denies Difficulty using hands for taps, buttons, cutlery, and/or writing: Denies   Review of Systems  Constitutional: Positive for fatigue. Negative for night sweats, weight gain, weight loss and weakness.  HENT: Negative for mouth sores, trouble swallowing, trouble swallowing, mouth dryness and nose dryness.   Eyes: Negative for pain, redness, visual disturbance and dryness.  Respiratory: Negative for cough, shortness of breath and difficulty breathing.   Cardiovascular: Negative for chest pain, palpitations, hypertension, irregular heartbeat and swelling in legs/feet.  Gastrointestinal: Negative for blood in stool, constipation and diarrhea.  Endocrine: Negative for increased urination.  Genitourinary: Negative for vaginal dryness.  Musculoskeletal: Positive for morning stiffness. Negative for arthralgias, joint pain, joint swelling, myalgias, muscle weakness, muscle tenderness and myalgias.  Skin: Negative for color change, rash, hair loss, skin tightness, ulcers and  sensitivity to sunlight.  Allergic/Immunologic: Negative for susceptible to infections.  Neurological: Negative for dizziness, memory loss and night sweats.  Hematological: Negative for swollen glands.  Psychiatric/Behavioral: Negative for depressed mood and sleep disturbance. The patient is not nervous/anxious.     PMFS History:  Patient Active Problem List   Diagnosis Date Noted  . Primary osteoarthritis of both knees 12/24/2016  . 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 DDD (degenerative disc disease), lumbar 11/17/2016  . Vitamin D deficiency 11/17/2016  . History of rosacea/ History of acne 11/17/2016  . High risk medications (not anticoagulants) long-term use 06/25/2016  . History of degenerative disc disease 06/25/2016  . Severe obesity (BMI >= 40) (HCC) 06/13/2014  . Left-sided thoracic back pain 07/19/2013  . PALPITATIONS 10/17/2009  . UTI 05/24/2009  . Rheumatoid arthritis (HCC) 05/24/2009  . Hair loss 02/22/2009  . VAGINAL BLEEDING 10/05/2008  . Carrier of group B Streptococcus 07/28/2007  . GERD 12/28/2006  . CARDIAC MURMUR, HX OF 12/28/2006  . HIATAL HERNIA, HX OF 12/28/2006    Past Medical History:  Diagnosis Date  . Arthritis   . GERD (gastroesophageal reflux disease)   . Heart murmur   . Rheumatoid arthritis (HCC)   . 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 Uterine cancer Mother   . Diabetes Father   . Kidney cancer Father   . Colon cancer Sister 35  . Rectal cancer Sister   . Colon cancer Paternal Grandfather   . Esophageal cancer Neg Hx   . Stomach cancer Neg Hx    Past  Surgical History:  Procedure Laterality Date  . NO PAST SURGERIES     Social History   Social History Narrative   Exercise--- walks 1x a week     Objective: Vital Signs: BP 130/82 (BP Location: Left Arm, Patient Position: Sitting, Cuff Size: Normal)   Pulse 80   Resp 18   Ht 5\' 3"  (1.6 m)   Wt 259 lb (117.5 kg)   BMI  45.88 kg/m    Physical Exam  Constitutional: She is oriented to person, place, and time. She appears well-developed and well-nourished.  HENT:  Head: Normocephalic and atraumatic.  Eyes: Conjunctivae and EOM are normal.  Neck: Normal range of motion.  Cardiovascular: Normal rate, regular rhythm, normal heart sounds and intact distal pulses.  Pulmonary/Chest: Effort normal and breath sounds normal.  Abdominal: Soft. Bowel sounds are normal.  Lymphadenopathy:    She has no cervical adenopathy.  Neurological: She is alert and oriented to person, place, and time.  Skin: Skin is warm and dry. Capillary refill takes less than 2 seconds.  Psychiatric: She has a normal mood and affect. Her behavior is normal.  Nursing note and vitals reviewed.    Musculoskeletal Exam: She has limited range of motion of her C-spine and thoracic lumbar spine without much discomfort. Shoulder joints elbow joints wrist joints good range of motion. She synovial thickening over her MCP and PIP joints with no synovitis. Hip joints knee joints are good range of motion. Ankle joints are good range of motion. She is no tenderness over her MTP joints.  CDAI Exam: CDAI Homunculus Exam:   Joint Counts:  CDAI Tender Joint count: 0 CDAI Swollen Joint count: 0  Global Assessments:  Patient Global Assessment: 4 Provider Global Assessment: 2  CDAI Calculated Score: 6    Investigation: No additional findings.TB Gold: 11/21/2016 Negative  CBC Latest Ref Rng & Units 03/30/2017 11/21/2016 08/14/2016  WBC 3.8 - 10.8 Thousand/uL 8.0 9.0 9.5  Hemoglobin 11.7 - 15.5 g/dL 08/16/2016 89.3 81.0  Hematocrit 35.0 - 45.0 % 38.3 38.4 38.8  Platelets 140 - 400 Thousand/uL 297 322 294   CMP Latest Ref Rng & Units 03/30/2017 11/21/2016 08/14/2016  Glucose 65 - 99 mg/dL 08/16/2016) 102(H) 852(D)  BUN 7 - 25 mg/dL 18 15 16   Creatinine 0.50 - 1.05 mg/dL 782(U 2.35  Sodium 135 - 146 mmol/L 138 137 138  Potassium 3.5 - 5.3 mmol/L 4.3 4.2 4.3    Chloride 98 - 110 mmol/L 102 104 102  CO2 20 - 32 mmol/L 30 24 24   Calcium 8.6 - 10.4 mg/dL 9.2 8.6 9.0  Total Protein 6.1 - 8.1 g/dL 7.2 7.0 7.1  Total Bilirubin 0.2 - 1.2 mg/dL 0.3 0.3 0.3  Alkaline Phos 33 - 130 U/L - 82 84  AST 10 - 35 U/L 26 15 15   ALT 6 - 29 U/L 24 12 15     Imaging: No results found.  Speciality Comments: No specialty comments available.    Procedures:  No procedures performed Allergies: Penicillins   Assessment / Plan:     Visit Diagnoses: Rheumatoid arthritis involving multiple sites with positive rheumatoid factor (HCC) - Positive RF, positive anti-CCP, erosive disease. Her arthritis seems to be very well controlled without any synovitis on examination. She has intermittent arthralgias and stiffness. She has not noticed any worsening of her symptoms by spacing Humira to every 3 weeks. She will continue with the current regimen.  High risk medications (not anticoagulants) long-term use - Humira 40 mg sq  q 3 weeks,  MTX 8 tabs po q wk, folic acid 2mg  po qd. Her labs have been stable we'll continue to monitor her labs every 3 months.  Primary osteoarthritis of both knees: Chronic pain area and weight loss diet and exercise was discussed.  DDD (degenerative disc disease), thoracic: She's not having  discomfort currently.  DDD (degenerative disc disease), lumbar: Chronic pain  History of rosacea/ History of acne  History of vitamin D deficiency: I'll check her vitamin D level next year. She's been taking vitamin D supplements.  History of gastroesophageal reflux (GERD)  History of palpitations  History of hiatal hernia  History of obesity    Orders: Orders Placed This Encounter  Procedures  . CBC with Differential/Platelet  . COMPLETE METABOLIC PANEL WITH GFR   No orders of the defined types were placed in this encounter.   Face-to-face time spent with patient was 30 minutes. Greater than 50% of time was spent in counseling and coordination  of care.  Follow-Up Instructions: Return in about 5 months (around 10/11/2017) for Rheumatoid arthritis, Osteoarthritis.   Pollyann Savoy, MD  Note - This record has been created using Animal nutritionist.  Chart creation errors have been sought, but may not always  have been located. Such creation errors do not reflect on  the standard of medical care.

## 2017-05-08 ENCOUNTER — Other Ambulatory Visit: Payer: Self-pay | Admitting: Rheumatology

## 2017-05-08 NOTE — Telephone Encounter (Signed)
Last Visit: 11/21/16 Next Visit: 05/13/17 Labs: 03/30/17 WNL  Okay to refill per Dr. Corliss Skains

## 2017-05-13 ENCOUNTER — Ambulatory Visit: Payer: BLUE CROSS/BLUE SHIELD | Admitting: Rheumatology

## 2017-05-13 ENCOUNTER — Encounter: Payer: Self-pay | Admitting: Rheumatology

## 2017-05-13 VITALS — BP 130/82 | HR 80 | Resp 18 | Ht 63.0 in | Wt 259.0 lb

## 2017-05-13 DIAGNOSIS — Z8719 Personal history of other diseases of the digestive system: Secondary | ICD-10-CM

## 2017-05-13 DIAGNOSIS — M170 Bilateral primary osteoarthritis of knee: Secondary | ICD-10-CM

## 2017-05-13 DIAGNOSIS — Z79899 Other long term (current) drug therapy: Secondary | ICD-10-CM | POA: Diagnosis not present

## 2017-05-13 DIAGNOSIS — Z872 Personal history of diseases of the skin and subcutaneous tissue: Secondary | ICD-10-CM

## 2017-05-13 DIAGNOSIS — M0579 Rheumatoid arthritis with rheumatoid factor of multiple sites without organ or systems involvement: Secondary | ICD-10-CM | POA: Diagnosis not present

## 2017-05-13 DIAGNOSIS — M5136 Other intervertebral disc degeneration, lumbar region: Secondary | ICD-10-CM

## 2017-05-13 DIAGNOSIS — M5134 Other intervertebral disc degeneration, thoracic region: Secondary | ICD-10-CM | POA: Diagnosis not present

## 2017-05-13 DIAGNOSIS — Z8639 Personal history of other endocrine, nutritional and metabolic disease: Secondary | ICD-10-CM | POA: Diagnosis not present

## 2017-05-13 NOTE — Patient Instructions (Signed)
Standing Labs We placed an order today for your standing lab work.    Please come back and get your standing labs in February and every 3 months  We have open lab Monday through Friday from 8:30-11:30 AM and 1:30-4 PM at the office of Dr. Luise Yamamoto.   The office is located at [address], [address], [place], [place] [address] No appointment is necessary.   Labs are drawn by [facility].  You may receive a bill from [facility] for your lab work. If you have any questions regarding directions or hours of operation,  please call [phone].    

## 2017-05-28 ENCOUNTER — Encounter: Payer: Self-pay | Admitting: Gastroenterology

## 2017-06-09 ENCOUNTER — Other Ambulatory Visit: Payer: Self-pay | Admitting: Rheumatology

## 2017-06-09 NOTE — Telephone Encounter (Signed)
Last Visit: 05/13/17 Next Visit: 08/26/17 Labs: 03/30/17 WNL TB Gold: 11/21/16 Neg   Okay to refill per Dr. Corliss Skains

## 2017-06-19 ENCOUNTER — Encounter: Payer: BLUE CROSS/BLUE SHIELD | Admitting: Family Medicine

## 2017-07-15 ENCOUNTER — Other Ambulatory Visit: Payer: Self-pay | Admitting: Family Medicine

## 2017-07-15 DIAGNOSIS — K219 Gastro-esophageal reflux disease without esophagitis: Secondary | ICD-10-CM

## 2017-07-22 ENCOUNTER — Other Ambulatory Visit: Payer: Self-pay

## 2017-07-22 DIAGNOSIS — Z79899 Other long term (current) drug therapy: Secondary | ICD-10-CM | POA: Diagnosis not present

## 2017-07-22 LAB — COMPLETE METABOLIC PANEL WITH GFR
AG Ratio: 1.1 (calc) (ref 1.0–2.5)
ALT: 16 U/L (ref 6–29)
AST: 16 U/L (ref 10–35)
Albumin: 3.8 g/dL (ref 3.6–5.1)
Alkaline phosphatase (APISO): 79 U/L (ref 33–130)
BUN: 16 mg/dL (ref 7–25)
CALCIUM: 9.4 mg/dL (ref 8.6–10.4)
CO2: 29 mmol/L (ref 20–32)
CREATININE: 0.61 mg/dL (ref 0.50–1.05)
Chloride: 103 mmol/L (ref 98–110)
GFR, EST AFRICAN AMERICAN: 121 mL/min/{1.73_m2} (ref >=60)
GFR, EST NON AFRICAN AMERICAN: 104 mL/min/{1.73_m2} (ref >=60)
GLOBULIN: 3.4 g/dL (ref 1.9–3.7)
Glucose, Bld: 97 mg/dL (ref 65–99)
Potassium: 4.5 mmol/L (ref 3.5–5.3)
SODIUM: 139 mmol/L (ref 135–146)
TOTAL PROTEIN: 7.2 g/dL (ref 6.1–8.1)
Total Bilirubin: 0.3 mg/dL (ref 0.2–1.2)

## 2017-07-22 LAB — CBC WITH DIFFERENTIAL/PLATELET
Basophils Absolute: 52 cells/uL (ref 0–200)
Basophils Relative: 0.6 %
EOS ABS: 138 {cells}/uL (ref 15–500)
Eosinophils Relative: 1.6 %
HEMATOCRIT: 38.4 % (ref 35.0–45.0)
Hemoglobin: 12.7 g/dL (ref 11.7–15.5)
Lymphs Abs: 2073 cells/uL (ref 850–3900)
MCH: 27.7 pg (ref 27.0–33.0)
MCHC: 33.1 g/dL (ref 32.0–36.0)
MCV: 83.8 fL (ref 80.0–100.0)
MPV: 10.2 fL (ref 7.5–12.5)
Monocytes Relative: 7.3 %
NEUTROS PCT: 66.4 %
Neutro Abs: 5710 cells/uL (ref 1500–7800)
PLATELETS: 338 10*3/uL (ref 140–400)
RBC: 4.58 10*6/uL (ref 3.80–5.10)
RDW: 16.6 % — AB (ref 11.0–15.0)
TOTAL LYMPHOCYTE: 24.1 %
WBC: 8.6 10*3/uL (ref 3.8–10.8)
WBCMIX: 628 {cells}/uL (ref 200–950)

## 2017-07-31 ENCOUNTER — Other Ambulatory Visit: Payer: Self-pay | Admitting: Rheumatology

## 2017-07-31 ENCOUNTER — Telehealth: Payer: Self-pay | Admitting: Rheumatology

## 2017-07-31 NOTE — Telephone Encounter (Signed)
Please called requesting to have her prescription of Methotrexate sent to [facility] on [address] in [place].  Patient states she changed pharmacies and is no longer using [facility] in [place].  Patient is requesting to pick it up today.

## 2017-07-31 NOTE — Telephone Encounter (Signed)
Patient advised that her prescription has been sent to the pharmacy.

## 2017-07-31 NOTE — Telephone Encounter (Signed)
Last Visit: 05/13/17 Next Visit: 08/26/17 Labs: 07/22/17 WNL  Okay to refill per Dr. Corliss Skains

## 2017-08-10 ENCOUNTER — Encounter: Payer: Self-pay | Admitting: Family Medicine

## 2017-08-10 ENCOUNTER — Ambulatory Visit (INDEPENDENT_AMBULATORY_CARE_PROVIDER_SITE_OTHER): Payer: BLUE CROSS/BLUE SHIELD | Admitting: Family Medicine

## 2017-08-10 VITALS — BP 126/80 | HR 92 | Temp 98.2°F | Resp 16 | Ht 63.0 in | Wt 253.6 lb

## 2017-08-10 DIAGNOSIS — R42 Dizziness and giddiness: Secondary | ICD-10-CM | POA: Diagnosis not present

## 2017-08-10 DIAGNOSIS — Z0000 Encounter for general adult medical examination without abnormal findings: Secondary | ICD-10-CM | POA: Diagnosis not present

## 2017-08-10 DIAGNOSIS — I771 Stricture of artery: Secondary | ICD-10-CM

## 2017-08-10 DIAGNOSIS — B001 Herpesviral vesicular dermatitis: Secondary | ICD-10-CM

## 2017-08-10 MED ORDER — VALACYCLOVIR HCL 1 G PO TABS: ORAL_TABLET | ORAL | 2 refills | Status: DC

## 2017-08-10 MED ORDER — ACYCLOVIR 5 % EX CREA: 1.0000 "application " | TOPICAL_CREAM | CUTANEOUS | 0 refills | Status: DC

## 2017-08-10 MED ORDER — FLUTICASONE PROPIONATE 50 MCG/ACT NA SUSP: 2.0000 | Freq: Every day | NASAL | 6 refills | Status: DC

## 2017-08-10 NOTE — Patient Instructions (Signed)
Preventive Care 40-64 Years, Female Preventive care refers to lifestyle choices and visits with your health care provider that can promote health and wellness. What does preventive care include?  A yearly physical exam. This is also called an annual well check.  Dental exams once or twice a year.  Routine eye exams. Ask your health care provider how often you should have your eyes checked.  Personal lifestyle choices, including: ? Daily care of your teeth and gums. ? Regular physical activity. ? Eating a healthy diet. ? Avoiding tobacco and drug use. ? Limiting alcohol use. ? Practicing safe sex. ? Taking low-dose aspirin daily starting at age 58. ? Taking vitamin and mineral supplements as recommended by your health care provider. What happens during an annual well check? The services and screenings done by your health care provider during your annual well check will depend on your age, overall health, lifestyle risk factors, and family history of disease. Counseling Your health care provider may ask you questions about your:  Alcohol use.  Tobacco use.  Drug use.  Emotional well-being.  Home and relationship well-being.  Sexual activity.  Eating habits.  Work and work Statistician.  Method of birth control.  Menstrual cycle.  Pregnancy history.  Screening You may have the following tests or measurements:  Height, weight, and BMI.  Blood pressure.  Lipid and cholesterol levels. These may be checked every 5 years, or more frequently if you are over 81 years old.  Skin check.  Lung cancer screening. You may have this screening every year starting at age 78 if you have a 30-pack-year history of smoking and currently smoke or have quit within the past 15 years.  Fecal occult blood test (FOBT) of the stool. You may have this test every year starting at age 65.  Flexible sigmoidoscopy or colonoscopy. You may have a sigmoidoscopy every 5 years or a colonoscopy  every 10 years starting at age 30.  Hepatitis C blood test.  Hepatitis B blood test.  Sexually transmitted disease (STD) testing.  Diabetes screening. This is done by checking your blood sugar (glucose) after you have not eaten for a while (fasting). You may have this done every 1-3 years.  Mammogram. This may be done every 1-2 years. Talk to your health care provider about when you should start having regular mammograms. This may depend on whether you have a family history of breast cancer.  BRCA-related cancer screening. This may be done if you have a family history of breast, ovarian, tubal, or peritoneal cancers.  Pelvic exam and Pap test. This may be done every 3 years starting at age 80. Starting at age 36, this may be done every 5 years if you have a Pap test in combination with an HPV test.  Bone density scan. This is done to screen for osteoporosis. You may have this scan if you are at high risk for osteoporosis.  Discuss your test results, treatment options, and if necessary, the need for more tests with your health care provider. Vaccines Your health care provider may recommend certain vaccines, such as:  Influenza vaccine. This is recommended every year.  Tetanus, diphtheria, and acellular pertussis (Tdap, Td) vaccine. You may need a Td booster every 10 years.  Varicella vaccine. You may need this if you have not been vaccinated.  Zoster vaccine. You may need this after age 5.  Measles, mumps, and rubella (MMR) vaccine. You may need at least one dose of MMR if you were born in  1957 or later. You may also need a second dose.  Pneumococcal 13-valent conjugate (PCV13) vaccine. You may need this if you have certain conditions and were not previously vaccinated.  Pneumococcal polysaccharide (PPSV23) vaccine. You may need one or two doses if you smoke cigarettes or if you have certain conditions.  Meningococcal vaccine. You may need this if you have certain  conditions.  Hepatitis A vaccine. You may need this if you have certain conditions or if you travel or work in places where you may be exposed to hepatitis A.  Hepatitis B vaccine. You may need this if you have certain conditions or if you travel or work in places where you may be exposed to hepatitis B.  Haemophilus influenzae type b (Hib) vaccine. You may need this if you have certain conditions.  Talk to your health care provider about which screenings and vaccines you need and how often you need them. This information is not intended to replace advice given to you by your health care provider. Make sure you discuss any questions you have with your health care provider. Document Released: 06/01/2015 Document Revised: 01/23/2016 Document Reviewed: 03/06/2015 Elsevier Interactive Patient Education  2018 Elsevier Inc.  

## 2017-08-10 NOTE — Progress Notes (Signed)
Subjective:     Donna Lopez is a 52 y.o. female and is here for a comprehensive physical exam. The patient reports no problems.  Social History   Socioeconomic History  . Marital status: Married    Spouse name: Not on file  . Number of children: Not on file  . Years of education: Not on file  . Highest education level: Not on file  Occupational History    Employer: [employer]    Comment: marketing  Social Needs  . Financial resource strain: Not on file  . Food insecurity:    Worry: Not on file    Inability: Not on file  . Transportation needs:    Medical: Not on file    Non-medical: Not on file  Tobacco Use  . Smoking status: Never Smoker  . Smokeless tobacco: Never Used  Substance and Sexual Activity  . Alcohol use: Yes    Comment: rare alcohol use-once/twice a year  . Drug use: No  . Sexual activity: Yes    Partners: Male  Lifestyle  . Physical activity:    Days per week: Not on file    Minutes per session: Not on file  . Stress: Not on file  Relationships  . Social connections:    Talks on phone: Not on file    Gets together: Not on file    Attends religious service: Not on file    Active member of club or organization: Not on file    Attends meetings of clubs or organizations: Not on file    Relationship status: Not on file  . Intimate partner violence:    Fear of current or ex partner: Not on file    Emotionally abused: Not on file    Physically abused: Not on file    Forced sexual activity: Not on file  Other Topics Concern  . Not on file  Social History Narrative   Exercise--- sometimes walks 1x a week   Health Maintenance  Topic Date Due  . PAP SMEAR  04/23/2014  . MAMMOGRAM  05/24/2016  . COLONOSCOPY  06/08/2017  . TETANUS/TDAP  06/13/2024  . INFLUENZA VACCINE  Completed  . HIV Screening  Completed    The following portions of the patient's history were reviewed and updated as appropriate:  She  has a past medical history of Arthritis, GERD  (gastroesophageal reflux disease), Heart murmur, Rheumatoid arthritis (HCC), and 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. She does not have any pertinent problems on file. She  has a past surgical history that includes No past surgeries. Her family history includes Colon cancer in her paternal grandfather; Colon cancer (age of onset: 86) in her sister; Diabetes in her father; Kidney cancer in her father; Rectal cancer in her sister; Uterine cancer in her mother. She  reports that she has never smoked. She has never used smokeless tobacco. She reports that she drinks alcohol. She reports that she does not use drugs. She has a current medication list which includes the following prescription(s): acyclovir cream, aspirin, calcium + d3, esomeprazole, folic acid, humira pen, l-lysine, methotrexate, misc natural products, ranitidine, valacyclovir, and fluticasone. Current Outpatient Medications on File Prior to Visit  Medication Sig Dispense Refill  . aspirin 81 MG tablet Take 81 mg by mouth daily.    . Calcium Carb-Cholecalciferol (CALCIUM + D3) 600-200 MG-UNIT TABS Take 1 capsule by mouth daily.    Marland Kitchen esomeprazole (NEXIUM) 40 MG capsule TAKE 1 CAPSULE DAILY 90 capsule 0  . folic acid (FOLVITE) 1 MG  tablet Take 2 tablets (2 mg total) by mouth daily. 180 tablet 4  . HUMIRA PEN 40 MG/0.8ML PNKT INJECT THE CONTENTS OF 1 PEN UNDER THE SKIN EVERY OTHER WEEK 3 each 0  . L-Lysine 500 MG CAPS Take 1 capsule by mouth daily.    . methotrexate (RHEUMATREX) 2.5 MG tablet TAKE 8 TABLETS BY MOUTH ON THE SAME DAY OF EVERY WEEK 96 tablet 0  . Misc Natural Products (OSTEO BI-FLEX JOINT SHIELD PO) Take 2 tablets by mouth daily.    . ranitidine (ZANTAC) 150 MG tablet Take one tablet at dinner (Patient taking differently: as needed. Take one tablet at dinner) 30 tablet 3   No current facility-administered medications on file prior to visit.    She is allergic to penicillins..  Review of Systems Review of Systems  Constitutional:  Negative for activity change, appetite change and fatigue.  HENT: Negative for hearing loss, congestion, tinnitus and ear discharge.  dentist q69m Eyes: Negative for visual disturbance (see optho q1y -- vision corrected to 20/20 with glasses).  Respiratory: Negative for cough, chest tightness and shortness of breath.   Cardiovascular: Negative for chest pain, palpitations and leg swelling.  Gastrointestinal: Negative for abdominal pain, diarrhea, constipation and abdominal distention.  Genitourinary: Negative for urgency, frequency, decreased urine volume and difficulty urinating.  Musculoskeletal: Negative for back pain, arthralgias and gait problem.  Skin: Negative for color change, pallor and rash.  Neurological: Negative for dizziness, light-headedness, numbness and headaches.  Hematological: Negative for adenopathy. Does not bruise/bleed easily.  Psychiatric/Behavioral: Negative for suicidal ideas, confusion, sleep disturbance, self-injury, dysphoric mood, decreased concentration and agitation.       Objective:    BP 126/80 (BP Location: Right Arm, Cuff Size: Large)   Pulse 92   Temp 98.2 F (36.8 C) (Oral)   Resp 16   Ht 5\' 3"  (1.6 m)   Wt 253 lb 9.6 oz (115 kg)   SpO2 97%   BMI 44.92 kg/m  General appearance: alert, cooperative, appears stated age and no distress Head: Normocephalic, without obvious abnormality, atraumatic Eyes: conjunctivae/corneas clear. PERRL, EOM's intact. Fundi benign. Ears: normal TM's and external ear canals both ears Nose: Nares normal. Septum midline. Mucosa normal. No drainage or sinus tenderness. Throat: lips, mucosa, and tongue normal; teeth and gums normal Neck: no adenopathy, no carotid bruit, no JVD, supple, symmetrical, trachea midline and thyroid not enlarged, symmetric, no tenderness/mass/nodules Back: symmetric, no curvature. ROM normal. No CVA tenderness. Lungs: clear to auscultation bilaterally Breasts: gyn Heart: regular rate and  rhythm, S1, S2 normal, no murmur, click, rub or gallop Abdomen: soft, non-tender; bowel sounds normal; no masses,  no organomegaly Pelvic: deferred--gyn Extremities: extremities normal, atraumatic, no cyanosis or edema Pulses: 2+ and symmetric Skin: Skin color, texture, turgor normal. No rashes or lesions Lymph nodes: Cervical, supraclavicular, and axillary nodes normal. Neurologic: Alert and oriented X 3, normal strength and tone. Normal symmetric reflexes. Normal coordination and gait    Assessment:    Healthy female exam.      Plan:  Check labs   ghm utd See After Visit Summary for Counseling Recommendations

## 2017-08-11 LAB — CBC WITH DIFFERENTIAL/PLATELET
Basophils Absolute: 0.1 10*3/uL (ref 0.0–0.1)
Basophils Relative: 1 % (ref 0.0–3.0)
EOS ABS: 0.2 10*3/uL (ref 0.0–0.7)
EOS PCT: 2.6 % (ref 0.0–5.0)
HEMATOCRIT: 38.8 % (ref 36.0–46.0)
HEMOGLOBIN: 12.6 g/dL (ref 12.0–15.0)
LYMPHS PCT: 26.6 % (ref 12.0–46.0)
Lymphs Abs: 2.5 10*3/uL (ref 0.7–4.0)
MCHC: 32.5 g/dL (ref 30.0–36.0)
MCV: 87.5 fl (ref 78.0–100.0)
Monocytes Absolute: 0.9 10*3/uL (ref 0.1–1.0)
Monocytes Relative: 9.6 % (ref 3.0–12.0)
Neutro Abs: 5.6 10*3/uL (ref 1.4–7.7)
Neutrophils Relative %: 60.2 % (ref 43.0–77.0)
Platelets: 328 10*3/uL (ref 150.0–400.0)
RBC: 4.43 Mil/uL (ref 3.87–5.11)
RDW: 18.5 % — AB (ref 11.5–15.5)
WBC: 9.3 10*3/uL (ref 4.0–10.5)

## 2017-08-11 LAB — LIPID PANEL
CHOLESTEROL: 181 mg/dL (ref 0–200)
HDL: 44.5 mg/dL (ref >39.00)
LDL CALC: 112 mg/dL — AB (ref 0–99)
NonHDL: 136.14
TRIGLYCERIDES: 123 mg/dL (ref 0.0–149.0)
Total CHOL/HDL Ratio: 4
VLDL: 24.6 mg/dL (ref 0.0–40.0)

## 2017-08-11 LAB — COMPREHENSIVE METABOLIC PANEL
ALBUMIN: 3.8 g/dL (ref 3.5–5.2)
ALK PHOS: 77 U/L (ref 39–117)
ALT: 22 U/L (ref 0–35)
AST: 22 U/L (ref 0–37)
BUN: 14 mg/dL (ref 6–23)
CALCIUM: 9.2 mg/dL (ref 8.4–10.5)
CHLORIDE: 101 meq/L (ref 96–112)
CO2: 27 mEq/L (ref 19–32)
Creatinine, Ser: 0.47 mg/dL (ref 0.40–1.20)
GFR: 147.51 mL/min (ref >60.00)
Glucose, Bld: 91 mg/dL (ref 70–99)
Potassium: 4 mEq/L (ref 3.5–5.1)
Sodium: 137 mEq/L (ref 135–145)
TOTAL PROTEIN: 7.7 g/dL (ref 6.0–8.3)
Total Bilirubin: 0.3 mg/dL (ref 0.2–1.2)

## 2017-08-11 LAB — TSH: TSH: 2 u[IU]/mL (ref 0.35–4.50)

## 2017-08-26 ENCOUNTER — Ambulatory Visit: Payer: BLUE CROSS/BLUE SHIELD | Admitting: Rheumatology

## 2017-09-07 DIAGNOSIS — H1789 Other corneal scars and opacities: Secondary | ICD-10-CM | POA: Diagnosis not present

## 2017-09-07 DIAGNOSIS — H5213 Myopia, bilateral: Secondary | ICD-10-CM | POA: Diagnosis not present

## 2017-09-08 ENCOUNTER — Encounter: Payer: Self-pay | Admitting: Gastroenterology

## 2017-09-08 ENCOUNTER — Other Ambulatory Visit: Payer: Self-pay | Admitting: Family Medicine

## 2017-09-08 DIAGNOSIS — Z1231 Encounter for screening mammogram for malignant neoplasm of breast: Secondary | ICD-10-CM

## 2017-09-17 ENCOUNTER — Other Ambulatory Visit: Payer: Self-pay | Admitting: Rheumatology

## 2017-09-17 NOTE — Telephone Encounter (Signed)
Last Visit: 05/13/17 Next Visit: 10/22/17  Okay to refill per Dr. Corliss Skains

## 2017-09-29 ENCOUNTER — Ambulatory Visit
Admission: RE | Admit: 2017-09-29 | Discharge: 2017-09-29 | Disposition: A | Discharge status: Home / Self Care | Payer: BLUE CROSS/BLUE SHIELD | Source: Ambulatory Visit | Attending: Family Medicine | Admitting: Family Medicine

## 2017-09-29 ENCOUNTER — Other Ambulatory Visit: Payer: Self-pay | Admitting: Rheumatology

## 2017-09-29 DIAGNOSIS — Z1231 Encounter for screening mammogram for malignant neoplasm of breast: Secondary | ICD-10-CM

## 2017-09-29 NOTE — Telephone Encounter (Signed)
Last Visit: 05/13/17 Next Visit: 10/22/17 Labs: 08/10/17 WNL TB Gold: 11/21/16 Neg   Okay to refill per Dr. Corliss Skains

## 2017-10-12 ENCOUNTER — Other Ambulatory Visit: Payer: Self-pay | Admitting: Rheumatology

## 2017-10-13 NOTE — Telephone Encounter (Signed)
Last visit: 05/13/2017 Next visit: 10/22/2017 Labs: 08/10/2017 WNL   Okay to refill per Dr. Corliss Skains.

## 2017-10-14 NOTE — Progress Notes (Signed)
Office Visit Note  Patient: Donna Lopez             Date of Birth: Jun 19, 1965           MRN: [id number]             PCP: Donato Schultz, DO Referring: Donato Schultz, * Visit Date: 10/22/2017 Occupation: @GUAROCC @    Subjective:  Medication management.   History of Present Illness: Donna Lopez is a 52 y.o. female with history of rheumatoid arthritis and osteoarthritis.  She has been taking methotrexate Treximet and Humira on a regular basis.  She states that she is not having any  joint swelling currently.  He continues to have discomfort in her knee joints due to osteoarthritis.  She has been having some discomfort over her left CMC joint.  Activities of Daily Living:  Patient reports morning stiffness for 5-10 minutes.   Patient Denies nocturnal pain.  Difficulty dressing/grooming: Denies Difficulty climbing stairs: Reports Difficulty getting out of chair: Reports Difficulty using hands for taps, buttons, cutlery, and/or writing: Reports   Review of Systems  Constitutional: Negative for fatigue.  HENT: Negative for mouth sores and mouth dryness.   Eyes: Negative for dryness.  Respiratory: Positive for shortness of breath.        With activity   Cardiovascular: Negative for chest pain and swelling in legs/feet.  Gastrointestinal: Negative for abdominal pain and constipation.  Endocrine: Negative for increased urination.  Genitourinary: Negative for pelvic pain.  Musculoskeletal: Positive for arthralgias, joint pain, joint swelling and morning stiffness.  Skin: Negative for rash and hair loss.  Allergic/Immunologic: Negative for susceptible to infections.  Neurological: Negative for dizziness, light-headedness, headaches, memory loss and weakness.  Hematological: Negative for bruising/bleeding tendency.  Psychiatric/Behavioral: Negative for confusion.    PMFS History:  Patient Active Problem List   Diagnosis Date Noted  . Primary osteoarthritis  of both knees 12/24/2016  . 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 DDD (degenerative disc disease), lumbar 11/17/2016  . Vitamin D deficiency 11/17/2016  . History of rosacea/ History of acne 11/17/2016  . High risk medications (not anticoagulants) long-term use 06/25/2016  . History of degenerative disc disease 06/25/2016  . Severe obesity (BMI >= 40) (HCC) 06/13/2014  . Left-sided thoracic back pain 07/19/2013  . PALPITATIONS 10/17/2009  . UTI 05/24/2009  . Rheumatoid arthritis (HCC) 05/24/2009  . Hair loss 02/22/2009  . VAGINAL BLEEDING 10/05/2008  . Carrier of group B Streptococcus 07/28/2007  . GERD 12/28/2006  . CARDIAC MURMUR, HX OF 12/28/2006  . HIATAL HERNIA, HX OF 12/28/2006    Past Medical History:  Diagnosis Date  . Arthritis   . GERD (gastroesophageal reflux disease)   . Heart murmur   . Rheumatoid arthritis (HCC)   . 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 Uterine cancer Mother   . Diabetes Father   . Kidney cancer Father   . Colon cancer Sister 88  . Rectal cancer Sister   . Colon cancer Paternal Grandfather   . Cancer Sister        breast   . Healthy Daughter   . Esophageal cancer Neg Hx   . Stomach cancer Neg Hx    Past Surgical History:  Procedure Laterality Date  . NO PAST SURGERIES     Social History   Social History Narrative   Exercise--- sometimes walks 1x a week  Objective: Vital Signs: BP (!) 143/94 (BP Location: Left Arm, Patient Position: Sitting, Cuff Size: Large)   Pulse 96   Resp 15   Ht 5\' 3"  (1.6 m)   Wt 249 lb (112.9 kg)   BMI 44.11 kg/m    Physical Exam  Constitutional: She is oriented to person, place, and time. She appears well-developed and well-nourished.  HENT:  Head: Normocephalic and atraumatic.  Eyes: Conjunctivae and EOM are normal.  Neck: Normal range of motion.  Cardiovascular: Normal rate, regular rhythm, normal heart sounds and intact distal pulses.    Pulmonary/Chest: Effort normal and breath sounds normal.  Abdominal: Soft. Bowel sounds are normal.  Lymphadenopathy:    She has no cervical adenopathy.  Neurological: She is alert and oriented to person, place, and time.  Skin: Skin is warm and dry. Capillary refill takes less than 2 seconds.  Psychiatric: She has a normal mood and affect. Her behavior is normal.  Nursing note and vitals reviewed.    Musculoskeletal Exam: C-spine thoracic lumbar spine good range of motion.  Shoulder joints elbow joints wrist joint MCPs PIPs DIPs been good range of motion.  She has bilateral PIP and DIP thickening and CMC thickening with discomfort over left CMC joint.  Hip joints knee joints ankles MTPs PIPs were in good range of motion.  She is crepitus with range of motion of bilateral knee joints without any warmth swelling or effusion.  CDAI Exam: CDAI Homunculus Exam:   Tenderness:  LUE: carpometacarpal RLE: tibiofemoral LLE: tibiofemoral  Joint Counts:  CDAI Tender Joint count: 2 CDAI Swollen Joint count: 0  Global Assessments:  Patient Global Assessment: 4 Provider Global Assessment: 2  CDAI Calculated Score: 8    Investigation: No additional findings.TB Gold: 11/21/2016 Negative  CBC Latest Ref Rng & Units 08/10/2017 07/22/2017 03/30/2017  WBC 4.0 - 10.5 K/uL 9.3 8.6 8.0  Hemoglobin 12.0 - 15.0 g/dL 17.4 71.5 95.3  Hematocrit 36.0 - 46.0 % 38.8 38.4 38.3  Platelets 150.0 - 400.0 K/uL 328.0 338 297   CMP Latest Ref Rng & Units 08/10/2017 07/22/2017 03/30/2017  Glucose 70 - 99 mg/dL 91 97 967(S)  BUN 6 - 23 mg/dL 14 16 18   Creatinine 0.40 - 1.20 mg/dL 8.97 9.15 0.41  Sodium 135 - 145 mEq/L 137 139 138  Potassium 3.5 - 5.1 mEq/L 4.0 4.5 4.3  Chloride 96 - 112 mEq/L 101 103 102  CO2 19 - 32 mEq/L 27 29 30   Calcium 8.4 - 10.5 mg/dL 9.2 9.4 9.2  Total Protein 6.0 - 8.3 g/dL 7.7 7.2 7.2  Total Bilirubin 0.2 - 1.2 mg/dL 0.3 0.3 0.3  Alkaline Phos 39 - 117 U/L 77 - -  AST 0 - 37 U/L 22  16 26   ALT 0 - 35 U/L 22 16 24     Imaging: Mm 3d Screen Breast Bilateral  Result Date: 09/29/2017 CLINICAL DATA:  Screening. EXAM: DIGITAL SCREENING BILATERAL MAMMOGRAM WITH TOMO AND CAD COMPARISON:  Previous exam(s). ACR Breast Density Category b: There are scattered areas of fibroglandular density. FINDINGS: There are no findings suspicious for malignancy. Images were processed with CAD. IMPRESSION: No mammographic evidence of malignancy. A result letter of this screening mammogram will be mailed directly to the patient. RECOMMENDATION: Screening mammogram in one year. (Code:SM-B-01Y) BI-RADS CATEGORY  1: Negative. Electronically Signed   By: Gerome Sam III M.D   On: 09/29/2017 16:04    Speciality Comments: No specialty comments available.    Procedures:  No procedures performed Allergies:  Penicillins   Assessment / Plan:     Visit Diagnoses: Rheumatoid arthritis involving multiple sites with positive rheumatoid factor (HCC) - Positive RF, positive anti-CCP, erosive disease.  Patient had no synovitis on examination she is doing better on current combination of medication.  High risk medications (not anticoagulants) long-term use - Humira 40 mg sq q 3 weeks,  MTX 8 tabs po q wk, folic acid 2mg  po qd. - Plan: CBC with Differential/Platelet, COMPLETE METABOLIC PANEL WITH GFR, QuantiFERON-TB Gold Plus today and then CBC and CMP every 3 months.  Primary osteoarthritis of both knees-she has chronic pain in her knee joints.  Muscle strengthening weight loss was discussed.  DDD (degenerative disc disease), thoracic-doing better  DDD (degenerative disc disease), lumbar-she has chronic discomfort.  History of obesity-weight loss discussed.  History of gastroesophageal reflux (GERD)  History of vitamin D deficiency  History of hiatal hernia  History of rosacea/ History of acne    Orders: Orders Placed This Encounter  Procedures  . CBC with Differential/Platelet  . COMPLETE  METABOLIC PANEL WITH GFR  . QuantiFERON-TB Gold Plus   No orders of the defined types were placed in this encounter.     Follow-Up Instructions: Return in about 5 months (around 03/24/2018) for Rheumatoid arthritis, Osteoarthritis.   13/10/2017, MD  Note - This record has been created using Pollyann Savoy.  Chart creation errors have been sought, but may not always  have been located. Such creation errors do not reflect on  the standard of medical care.

## 2017-10-19 ENCOUNTER — Other Ambulatory Visit: Payer: Self-pay | Admitting: Family Medicine

## 2017-10-19 DIAGNOSIS — K219 Gastro-esophageal reflux disease without esophagitis: Secondary | ICD-10-CM

## 2017-10-22 ENCOUNTER — Encounter: Payer: Self-pay | Admitting: Rheumatology

## 2017-10-22 ENCOUNTER — Ambulatory Visit: Payer: BLUE CROSS/BLUE SHIELD | Admitting: Rheumatology

## 2017-10-22 VITALS — BP 143/94 | HR 96 | Resp 15 | Ht 63.0 in | Wt 249.0 lb

## 2017-10-22 DIAGNOSIS — M170 Bilateral primary osteoarthritis of knee: Secondary | ICD-10-CM

## 2017-10-22 DIAGNOSIS — Z8719 Personal history of other diseases of the digestive system: Secondary | ICD-10-CM | POA: Diagnosis not present

## 2017-10-22 DIAGNOSIS — M0579 Rheumatoid arthritis with rheumatoid factor of multiple sites without organ or systems involvement: Secondary | ICD-10-CM | POA: Diagnosis not present

## 2017-10-22 DIAGNOSIS — Z79899 Other long term (current) drug therapy: Secondary | ICD-10-CM

## 2017-10-22 DIAGNOSIS — M5136 Other intervertebral disc degeneration, lumbar region: Secondary | ICD-10-CM

## 2017-10-22 DIAGNOSIS — M5134 Other intervertebral disc degeneration, thoracic region: Secondary | ICD-10-CM

## 2017-10-22 DIAGNOSIS — Z872 Personal history of diseases of the skin and subcutaneous tissue: Secondary | ICD-10-CM | POA: Diagnosis not present

## 2017-10-22 DIAGNOSIS — Z8639 Personal history of other endocrine, nutritional and metabolic disease: Secondary | ICD-10-CM | POA: Diagnosis not present

## 2017-10-23 NOTE — Progress Notes (Signed)
Labs are stable.

## 2017-10-24 LAB — CBC WITH DIFFERENTIAL/PLATELET
BASOS ABS: 58 {cells}/uL (ref 0–200)
Basophils Relative: 0.5 %
Eosinophils Absolute: 196 cells/uL (ref 15–500)
Eosinophils Relative: 1.7 %
HEMATOCRIT: 40.2 % (ref 35.0–45.0)
Hemoglobin: 13.8 g/dL (ref 11.7–15.5)
LYMPHS ABS: 2013 {cells}/uL (ref 850–3900)
MCH: 28.2 pg (ref 27.0–33.0)
MCHC: 34.3 g/dL (ref 32.0–36.0)
MCV: 82 fL (ref 80.0–100.0)
MPV: 9.9 fL (ref 7.5–12.5)
Monocytes Relative: 9.1 %
NEUTROS PCT: 71.2 %
Neutro Abs: 8188 cells/uL — ABNORMAL HIGH (ref 1500–7800)
Platelets: 331 10*3/uL (ref 140–400)
RBC: 4.9 10*6/uL (ref 3.80–5.10)
RDW: 17 % — AB (ref 11.0–15.0)
Total Lymphocyte: 17.5 %
WBC mixed population: 1047 cells/uL — ABNORMAL HIGH (ref 200–950)
WBC: 11.5 10*3/uL — ABNORMAL HIGH (ref 3.8–10.8)

## 2017-10-24 LAB — COMPLETE METABOLIC PANEL WITH GFR
AG RATIO: 1.3 (calc) (ref 1.0–2.5)
ALKALINE PHOSPHATASE (APISO): 92 U/L (ref 33–130)
ALT: 17 U/L (ref 6–29)
AST: 21 U/L (ref 10–35)
Albumin: 4.4 g/dL (ref 3.6–5.1)
BUN: 13 mg/dL (ref 7–25)
CHLORIDE: 100 mmol/L (ref 98–110)
CO2: 27 mmol/L (ref 20–32)
Calcium: 9.8 mg/dL (ref 8.6–10.4)
Creat: 0.59 mg/dL (ref 0.50–1.05)
GFR, Est African American: 122 mL/min/{1.73_m2} (ref >=60)
GFR, Est Non African American: 105 mL/min/{1.73_m2} (ref >=60)
GLOBULIN: 3.4 g/dL (ref 1.9–3.7)
Glucose, Bld: 92 mg/dL (ref 65–99)
POTASSIUM: 4.3 mmol/L (ref 3.5–5.3)
SODIUM: 136 mmol/L (ref 135–146)
Total Bilirubin: 0.4 mg/dL (ref 0.2–1.2)
Total Protein: 7.8 g/dL (ref 6.1–8.1)

## 2017-10-24 LAB — QUANTIFERON-TB GOLD PLUS
NIL: 0.04 [IU]/mL
QuantiFERON-TB Gold Plus: NEGATIVE
TB1-NIL: 0.01 IU/mL
TB2-NIL: <0 IU/mL

## 2017-11-16 ENCOUNTER — Ambulatory Visit (AMBULATORY_SURGERY_CENTER): Payer: Self-pay | Admitting: *Deleted

## 2017-11-16 ENCOUNTER — Other Ambulatory Visit: Payer: Self-pay

## 2017-11-16 VITALS — Ht 63.0 in | Wt 245.0 lb

## 2017-11-16 DIAGNOSIS — Z800 Family history of malignant neoplasm of digestive organs: Secondary | ICD-10-CM

## 2017-11-16 MED ORDER — NA SULFATE-K SULFATE-MG SULF 17.5-3.13-1.6 GM/177ML PO SOLN: 1.0000 | Freq: Once | ORAL | 0 refills | Status: AC

## 2017-11-16 NOTE — Progress Notes (Signed)
No egg or soy allergy known to patient  No issues with past sedation with any surgeries  or procedures, no intubation problems  No diet pills per patient No home 02 use per patient  No blood thinners per patient  Pt denies issues with constipation  No A fib or A flutter  EMMI video sent to pt's e mail - pt declined  $15 coupon for Suprep to pt today in PV  Pt states she had some vomiting with her Movi Prep last colon- I asked about the need for Reglan, that I could ask Dr Lavon Paganini for some and pt declined- Ws discussed using cold water, a straw, chasing with coke or broth and drinking it slow, not forcing the prep  and I instructed her to call for the anti- nausea meds if she changes her mind - pt verbalized understanding

## 2017-11-23 DIAGNOSIS — F4320 Adjustment disorder, unspecified: Secondary | ICD-10-CM | POA: Diagnosis not present

## 2017-11-30 DIAGNOSIS — F4320 Adjustment disorder, unspecified: Secondary | ICD-10-CM | POA: Diagnosis not present

## 2017-12-02 ENCOUNTER — Ambulatory Visit (AMBULATORY_SURGERY_CENTER): Payer: BLUE CROSS/BLUE SHIELD | Admitting: Gastroenterology

## 2017-12-02 ENCOUNTER — Encounter: Payer: Self-pay | Admitting: Gastroenterology

## 2017-12-02 VITALS — BP 138/90 | HR 77 | Temp 97.8°F | Resp 21 | Ht 63.0 in | Wt 245.0 lb

## 2017-12-02 DIAGNOSIS — Z1211 Encounter for screening for malignant neoplasm of colon: Secondary | ICD-10-CM | POA: Diagnosis not present

## 2017-12-02 DIAGNOSIS — Z800 Family history of malignant neoplasm of digestive organs: Secondary | ICD-10-CM | POA: Diagnosis not present

## 2017-12-02 MED ORDER — SODIUM CHLORIDE 0.9 % IV SOLN: 500.0000 mL | INTRAVENOUS | Status: DC

## 2017-12-02 NOTE — Progress Notes (Signed)
Report to PACU, RN, vss, BBS= Clear.  

## 2017-12-02 NOTE — Patient Instructions (Signed)
YOU HAD AN ENDOSCOPIC PROCEDURE TODAY AT [facility]:   Refer to the procedure report that was given to you for any specific questions about what was found during the examination.  If the procedure report does not answer your questions, please call your gastroenterologist to clarify.  If you requested that your care partner not be given the details of your procedure findings, then the procedure report has been included in a sealed envelope for you to review at your convenience later.  YOU SHOULD EXPECT: Some feelings of bloating in the abdomen. Passage of more gas than usual.  Walking can help get rid of the air that was put into your GI tract during the procedure and reduce the bloating. If you had a lower endoscopy (such as a colonoscopy or flexible sigmoidoscopy) you may notice spotting of blood in your stool or on the toilet paper. If you underwent a bowel prep for your procedure, you may not have a normal bowel movement for a few days.  Please Note:  You might notice some irritation and congestion in your nose or some drainage.  This is from the oxygen used during your procedure.  There is no need for concern and it should clear up in a day or so.  SYMPTOMS TO REPORT IMMEDIATELY:   Following lower endoscopy (colonoscopy or flexible sigmoidoscopy):  Excessive amounts of blood in the stool  Significant tenderness or worsening of abdominal pains  Swelling of the abdomen that is new, acute  Fever of 100F or higher   Following upper endoscopy (EGD)  Vomiting of blood or coffee ground material  New chest pain or pain under the shoulder blades  Painful or persistently difficult swallowing  New shortness of breath  Fever of 100F or higher  Black, tarry-looking stools  For urgent or emergent issues, a gastroenterologist can be reached at any hour by calling [phone].   DIET:  We do recommend a small meal at first, but then you may proceed to your regular diet.  Drink  plenty of fluids but you should avoid alcoholic beverages for 24 hours.  ACTIVITY:  You should plan to take it easy for the rest of today and you should NOT DRIVE or use heavy machinery until tomorrow (because of the sedation medicines used during the test).    FOLLOW UP: Our staff will call the number listed on your records the next business day following your procedure to check on you and address any questions or concerns that you may have regarding the information given to you following your procedure. If we do not reach you, we will leave a message.  However, if you are feeling well and you are not experiencing any problems, there is no need to return our call.  We will assume that you have returned to your regular daily activities without incident.  If any biopsies were taken you will be contacted by phone or by letter within the next 1-3 weeks.  Please call us at [phone] if you have not heard about the biopsies in 3 weeks.    SIGNATURES/CONFIDENTIALITY: You and/or your care partner have signed paperwork which will be entered into your electronic medical record.  These signatures attest to the fact that that the information above on your After Visit Summary has been reviewed and is understood.  Full responsibility of the confidentiality of this discharge information lies with you and/or your care-partner.YOU HAD AN ENDOSCOPIC PROCEDURE TODAY AT [facility]:  Refer to the procedure report that was given to you for any specific questions about what was found during the examination.  If the procedure report does not answer your questions, please call your gastroenterologist to clarify.  If you requested that your care partner not be given the details of your procedure findings, then the procedure report has been included in a sealed envelope for you to review at your convenience later.  YOU SHOULD EXPECT: Some feelings of bloating in the abdomen. Passage of more gas than  usual.  Walking can help get rid of the air that was put into your GI tract during the procedure and reduce the bloating. If you had a lower endoscopy (such as a colonoscopy or flexible sigmoidoscopy) you may notice spotting of blood in your stool or on the toilet paper. If you underwent a bowel prep for your procedure, you may not have a normal bowel movement for a few days.  Please Note:  You might notice some irritation and congestion in your nose or some drainage.  This is from the oxygen used during your procedure.  There is no need for concern and it should clear up in a day or so.  SYMPTOMS TO REPORT IMMEDIATELY:   Following lower endoscopy (colonoscopy or flexible sigmoidoscopy):  Excessive amounts of blood in the stool  Significant tenderness or worsening of abdominal pains  Swelling of the abdomen that is new, acute  Fever of 100F or higher   Following upper endoscopy (EGD)  Vomiting of blood or coffee ground material  New chest pain or pain under the shoulder blades  Painful or persistently difficult swallowing  New shortness of breath  Fever of 100F or higher  Black, tarry-looking stools  For urgent or emergent issues, a gastroenterologist can be reached at any hour by calling [phone].   DIET:  We do recommend a small meal at first, but then you may proceed to your regular diet.  Drink plenty of fluids but you should avoid alcoholic beverages for 24 hours.  ACTIVITY:  You should plan to take it easy for the rest of today and you should NOT DRIVE or use heavy machinery until tomorrow (because of the sedation medicines used during the test).    FOLLOW UP: Our staff will call the number listed on your records the next business day following your procedure to check on you and address any questions or concerns that you may have regarding the information given to you following your procedure. If we do not reach you, we will leave a message.  However, if you are feeling  well and you are not experiencing any problems, there is no need to return our call.  We will assume that you have returned to your regular daily activities without incident.  If any biopsies were taken you will be contacted by phone or by letter within the next 1-3 weeks.  Please call us at [phone] if you have not heard about the biopsies in 3 weeks.    SIGNATURES/CONFIDENTIALITY: You and/or your care partner have signed paperwork which will be entered into your electronic medical record.  These signatures attest to the fact that that the information above on your After Visit Summary has been reviewed and is understood.  Full responsibility of the confidentiality of this discharge information lies with you and/or your care-partner.  Hemorrhoid and diverticulosis information given.  Recall colonoscopy   5 years-2024

## 2017-12-02 NOTE — Progress Notes (Signed)
No changes in medical or surgical hx since PV per pt 

## 2017-12-02 NOTE — Op Note (Signed)
[facility] Patient Name: Donna Lopez Procedure Date: 12/02/2017 7:47 AM MRN: [id number] Endoscopist: Napoleon Form , MD Age: 52 Referring MD:  Date of Birth: [birth year] Gender: Female Account #: [id number] Procedure:                Colonoscopy Indications:              Screening in patient at increased risk: Family                            history of 1st-degree relative with colorectal                            cancer before age 38 years Medicines:                Monitored Anesthesia Care Procedure:                Pre-Anesthesia Assessment:                           - Prior to the procedure, a History and Physical                            was performed, and patient medications and                            allergies were reviewed. The patient's tolerance of                            previous anesthesia was also reviewed. The risks                            and benefits of the procedure and the sedation                            options and risks were discussed with the patient.                            All questions were answered, and informed consent                            was obtained. Prior Anticoagulants: The patient has                            taken no previous anticoagulant or antiplatelet                            agents. ASA Grade Assessment: II - A patient with                            mild systemic disease. After reviewing the risks                            and benefits, the patient was deemed in  satisfactory condition to undergo the procedure.                           After obtaining informed consent, the colonoscope                            was passed under direct vision. Throughout the                            procedure, the patient's blood pressure, pulse, and                            oxygen saturations were monitored continuously. The                            Model PCF-[id number])  scope was introduced                            through the anus and advanced to the the cecum,                            identified by appendiceal orifice and ileocecal                            valve. The colonoscopy was performed without                            difficulty. The patient tolerated the procedure                            well. The quality of the bowel preparation was                            excellent. The ileocecal valve, appendiceal                            orifice, and rectum were photographed. Scope In: 8:09:12 AM Scope Out: 8:24:53 AM Scope Withdrawal Time: 0 hours 12 minutes 37 seconds  Total Procedure Duration: 0 hours 15 minutes 41 seconds  Findings:                 The perianal and digital rectal examinations were                            normal.                           Scattered small-mouthed diverticula were found in                            the sigmoid colon and descending colon.                           Non-bleeding internal hemorrhoids were found during  retroflexion. The hemorrhoids were small.                           The exam was otherwise without abnormality. Complications:            No immediate complications. Estimated Blood Loss:     Estimated blood loss: none. Impression:               - Diverticulosis in the sigmoid colon and in the                            descending colon.                           - Non-bleeding internal hemorrhoids.                           - The examination was otherwise normal.                           - No specimens collected. Recommendation:           - Patient has a contact number available for                            emergencies. The signs and symptoms of potential                            delayed complications were discussed with the                            patient. Return to normal activities tomorrow.                            Written discharge instructions were  provided to the                            patient.                           - Resume previous diet.                           - Continue present medications.                           - Repeat colonoscopy in 5 years for screening                            purposes. Napoleon Form, MD 12/02/2017 8:28:20 AM This report has been signed electronically.

## 2017-12-03 ENCOUNTER — Encounter: Payer: Self-pay | Admitting: Gastroenterology

## 2017-12-03 ENCOUNTER — Telehealth: Payer: Self-pay

## 2017-12-03 NOTE — Telephone Encounter (Signed)
Left message

## 2017-12-03 NOTE — Telephone Encounter (Signed)
  Follow up Call-  Call Kadance Mccuistion number 12/02/2017  Post procedure Call Oletha Tolson phone  # [phone]  Permission to leave phone message Yes  Some recent data might be hidden     Patient questions:  Do you have a fever, pain , or abdominal swelling? No. Pain Score  0 *  Have you tolerated food without any problems? Yes.    Have you been able to return to your normal activities? Yes.    Do you have any questions about your discharge instructions: Diet   No. Medications  No. Follow up visit  No.  Do you have questions or concerns about your Care? No.  Actions: * If pain score is 4 or above: No action needed, pain <4.

## 2018-01-01 ENCOUNTER — Ambulatory Visit (INDEPENDENT_AMBULATORY_CARE_PROVIDER_SITE_OTHER): Payer: BLUE CROSS/BLUE SHIELD | Admitting: Orthopaedic Surgery

## 2018-01-01 ENCOUNTER — Encounter (INDEPENDENT_AMBULATORY_CARE_PROVIDER_SITE_OTHER): Payer: Self-pay | Admitting: Orthopaedic Surgery

## 2018-01-01 ENCOUNTER — Ambulatory Visit (INDEPENDENT_AMBULATORY_CARE_PROVIDER_SITE_OTHER): Payer: Self-pay

## 2018-01-01 VITALS — BP 134/88 | HR 93 | Ht 63.0 in | Wt 235.0 lb

## 2018-01-01 DIAGNOSIS — G8929 Other chronic pain: Secondary | ICD-10-CM

## 2018-01-01 DIAGNOSIS — M25562 Pain in left knee: Secondary | ICD-10-CM

## 2018-01-01 DIAGNOSIS — F4320 Adjustment disorder, unspecified: Secondary | ICD-10-CM | POA: Diagnosis not present

## 2018-01-01 MED ORDER — BUPIVACAINE HCL 0.5 % IJ SOLN
2.0000 mL | INTRAMUSCULAR | Status: AC | PRN
Start: 2018-01-01 — End: 2018-01-01
  Administered 2018-01-01: 2 mL via INTRA_ARTICULAR

## 2018-01-01 MED ORDER — METHYLPREDNISOLONE ACETATE 40 MG/ML IJ SUSP
80.0000 mg | INTRAMUSCULAR | Status: AC | PRN
  Administered 2018-01-01: 80 mg

## 2018-01-01 MED ORDER — LIDOCAINE HCL 1 % IJ SOLN
2.0000 mL | INTRAMUSCULAR | Status: AC | PRN
  Administered 2018-01-01: 2 mL

## 2018-01-01 NOTE — Progress Notes (Signed)
Office Visit Note   Patient: Donna Lopez           Date of Birth: [birth year]           MRN: [id number] Visit Date: 01/01/2018              Requested by: [address] [place], [place] [address] [place], [place] [address] PCP: Zola Button, Grayling Congress, DO   Assessment & Plan: Visit Diagnoses:  1. Chronic pain of left knee     Plan: Degenerative arthritis left knee-advanced.  History of rheumatoid arthritis being followed by Dr. Corliss Skains with Humira and methotrexate.  Long discussion regarding diagnosis and treatment options.  Would suggest continued exercises and weight loss.  Not interested in knee replacement at this point.  Will inject with cortisone  Follow-Up Instructions: No follow-ups on file.   Orders:  Orders Placed This Encounter  Procedures  . Large Joint Inj: L knee  . XR KNEE 3 VIEW LEFT   No orders of the defined types were placed in this encounter.     Procedures: Large Joint Inj: L knee on 01/01/2018 9:40 AM Indications: pain and diagnostic evaluation Details: 25 G 1.5 in needle, anteromedial approach  Arthrogram: No  Medications: 2 mL lidocaine 1 %; 2 mL bupivacaine 0.5 %; 80 mg methylPREDNISolone acetate 40 MG/ML Procedure, treatment alternatives, risks and benefits explained, specific risks discussed. Consent was given by the patient. Patient was prepped and draped in the usual sterile fashion.       Clinical Data: No additional findings.   Subjective: Chief Complaint  Patient presents with  . Follow-up    RE CURRENT L KNEE PAIN GETTING WORSE 4 WEEKS, NO NEW INJURY. HAD LEFT KNEE VISCO INJECTIONS 1 YR AGO.   Donna Lopez is 52 years old and has a history of rheumatoid arthritis.  She is presently being followed by Dr. Corliss Skains with Humira and methotrexate.  She has a chronic pain in her left knee.  She has had prior Visco supplementation with only temporary relief.  Presently the pain interferes with of a lot of her activities.   Her employment requires her to travel frequently and that has been an issue.  She has some swelling and some limitation of motion.  Has been exercising and recently lost about 20 pounds  HPI  Review of Systems  Constitutional: Negative for fatigue and fever.  HENT: Negative for ear pain.   Eyes: Negative for pain.  Respiratory: Negative for cough and shortness of breath.   Cardiovascular: Positive for leg swelling.  Gastrointestinal: Negative for constipation and diarrhea.  Genitourinary: Negative for difficulty urinating.  Musculoskeletal: Negative for back pain and neck pain.  Skin: Negative for rash.  Allergic/Immunologic: Negative for food allergies.  Neurological: Positive for weakness. Negative for numbness.  Hematological: Does not bruise/bleed easily.  Psychiatric/Behavioral: Positive for sleep disturbance.     Objective: Vital Signs: BP 134/88 (BP Location: Left Arm, Patient Position: Sitting, Cuff Size: Normal)   Pulse 93   Ht 5\' 3"  (1.6 m)   Wt 235 lb (106.6 kg)   BMI 41.63 kg/m   Physical Exam  Constitutional: She is oriented to person, place, and time. She appears well-developed and well-nourished.  HENT:  Mouth/Throat: Oropharynx is clear and moist.  Eyes: Pupils are equal, round, and reactive to light. EOM are normal.  Pulmonary/Chest: Effort normal.  Neurological: She is alert and oriented to person, place, and time.  Skin: Skin is warm  and dry.  Psychiatric: She has a normal mood and affect. Her behavior is normal.    Ortho Exam awake alert and oriented x3.  Comfortable sitting.  Incomplete extension left knee lacking about 10 degrees.  No effusion.  Knee was not hot warm or red.  No instability.  Flexed over 100 degrees.  No calf pain.  No popliteal mass or pain.  Neurovascular exam intact distally.  Predominant medial joint pain.  Some patellar crepitation.  No pain laterally Specialty Comments:  No specialty comments available.  Imaging: Xr Knee 3 View  Left  Result Date: 01/01/2018 3 views of the left knee were obtained and standing projections.  There appears to be some decrease in bony mineralization.  There is bone-on-bone in the medial compartment with about 3 to 4 degrees of varus.  There are peripheral osteophytes more predominant medially than laterally.  The medial side there is also subchondral sclerosis.  There arch degenerative changes the patellofemoral joint.  Films are consistent with advanced osteoarthritis.    PMFS History: Patient Active Problem List   Diagnosis Date Noted  . Primary osteoarthritis of both knees 12/24/2016  . 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 DDD (degenerative disc disease), lumbar 11/17/2016  . Vitamin D deficiency 11/17/2016  . History of rosacea/ History of acne 11/17/2016  . High risk medications (not anticoagulants) long-term use 06/25/2016  . History of degenerative disc disease 06/25/2016  . Severe obesity (BMI >= 40) (HCC) 06/13/2014  . Left-sided thoracic back pain 07/19/2013  . PALPITATIONS 10/17/2009  . UTI 05/24/2009  . Rheumatoid arthritis (HCC) 05/24/2009  . Hair loss 02/22/2009  . VAGINAL BLEEDING 10/05/2008  . Carrier of group B Streptococcus 07/28/2007  . GERD 12/28/2006  . CARDIAC MURMUR, HX OF 12/28/2006  . HIATAL HERNIA, HX OF 12/28/2006   Past Medical History:  Diagnosis Date  . Allergy   . Arthritis   . GERD (gastroesophageal reflux disease)   . Heart murmur   . Rheumatoid arthritis (HCC)   . 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 Uterine cancer Mother   . Diabetes Father   . Kidney cancer Father   . Colon cancer Sister 74  . Rectal cancer Sister   . Colon cancer Paternal Grandfather   . Colon polyps Brother   . Cancer Sister        breast   . Healthy Daughter   . Esophageal cancer Neg Hx   . Stomach cancer Neg Hx     Past Surgical History:  Procedure Laterality Date  . COLONOSCOPY  2014   DB-  normal   . NO PAST SURGERIES    . 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 Smoking status: Never Smoker  . Smokeless tobacco: Never Used  Substance and Sexual Activity  . Alcohol use: Yes    Comment: rare alcohol use-once/twice a year  . Drug use: Never  . Sexual activity: Yes    Partners: Male

## 2018-01-07 ENCOUNTER — Other Ambulatory Visit: Payer: Self-pay | Admitting: Obstetrics and Gynecology

## 2018-01-07 ENCOUNTER — Other Ambulatory Visit (HOSPITAL_COMMUNITY)
Admission: RE | Admit: 2018-01-07 | Discharge: 2018-01-07 | Disposition: A | Discharge status: Home / Self Care | Payer: BLUE CROSS/BLUE SHIELD | Source: Ambulatory Visit | Attending: Obstetrics and Gynecology | Admitting: Obstetrics and Gynecology

## 2018-01-07 DIAGNOSIS — Z01419 Encounter for gynecological examination (general) (routine) without abnormal findings: Secondary | ICD-10-CM | POA: Diagnosis not present

## 2018-01-07 DIAGNOSIS — Z01411 Encounter for gynecological examination (general) (routine) with abnormal findings: Secondary | ICD-10-CM | POA: Diagnosis not present

## 2018-01-07 DIAGNOSIS — N3946 Mixed incontinence: Secondary | ICD-10-CM | POA: Diagnosis not present

## 2018-01-11 ENCOUNTER — Other Ambulatory Visit: Payer: Self-pay | Admitting: Rheumatology

## 2018-01-11 DIAGNOSIS — F4320 Adjustment disorder, unspecified: Secondary | ICD-10-CM | POA: Diagnosis not present

## 2018-01-11 LAB — CYTOLOGY - PAP
ADEQUACY: ABSENT
Diagnosis: NEGATIVE
HPV: NOT DETECTED

## 2018-01-11 NOTE — Telephone Encounter (Signed)
Last Visit: 10/22/17 Next visit: 03/25/18 Labs: 10/22/17 Stable  Okay to refill per Dr. Antony Odea

## 2018-01-13 ENCOUNTER — Other Ambulatory Visit: Payer: Self-pay | Admitting: Rheumatology

## 2018-01-13 NOTE — Telephone Encounter (Signed)
Last Visit: 10/22/17 Next Visit: 03/25/18 Labs: 10/22/17 stable TB Gold: 10/22/17 Neg   Okay to refill per Dr. Antony Odea

## 2018-01-18 ENCOUNTER — Other Ambulatory Visit: Payer: Self-pay | Admitting: Family Medicine

## 2018-01-18 DIAGNOSIS — K219 Gastro-esophageal reflux disease without esophagitis: Secondary | ICD-10-CM

## 2018-01-22 DIAGNOSIS — F4320 Adjustment disorder, unspecified: Secondary | ICD-10-CM | POA: Diagnosis not present

## 2018-02-01 DIAGNOSIS — F4320 Adjustment disorder, unspecified: Secondary | ICD-10-CM | POA: Diagnosis not present

## 2018-02-10 ENCOUNTER — Other Ambulatory Visit: Payer: Self-pay

## 2018-02-10 DIAGNOSIS — Z79899 Other long term (current) drug therapy: Secondary | ICD-10-CM

## 2018-02-10 DIAGNOSIS — F4320 Adjustment disorder, unspecified: Secondary | ICD-10-CM | POA: Diagnosis not present

## 2018-02-11 LAB — COMPLETE METABOLIC PANEL WITH GFR
AG RATIO: 1.1 (calc) (ref 1.0–2.5)
ALBUMIN MSPROF: 3.7 g/dL (ref 3.6–5.1)
ALT: 15 U/L (ref 6–29)
AST: 19 U/L (ref 10–35)
Alkaline phosphatase (APISO): 86 U/L (ref 33–130)
BUN: 12 mg/dL (ref 7–25)
CALCIUM: 9.3 mg/dL (ref 8.6–10.4)
CO2: 27 mmol/L (ref 20–32)
Chloride: 104 mmol/L (ref 98–110)
Creat: 0.57 mg/dL (ref 0.50–1.05)
GFR, EST AFRICAN AMERICAN: 123 mL/min/{1.73_m2} (ref >=60)
GFR, EST NON AFRICAN AMERICAN: 106 mL/min/{1.73_m2} (ref >=60)
GLOBULIN: 3.3 g/dL (ref 1.9–3.7)
Glucose, Bld: 94 mg/dL (ref 65–99)
POTASSIUM: 4.4 mmol/L (ref 3.5–5.3)
SODIUM: 139 mmol/L (ref 135–146)
Total Bilirubin: 0.3 mg/dL (ref 0.2–1.2)
Total Protein: 7 g/dL (ref 6.1–8.1)

## 2018-02-11 LAB — CBC WITH DIFFERENTIAL/PLATELET
BASOS ABS: 43 {cells}/uL (ref 0–200)
Basophils Relative: 0.5 %
EOS PCT: 1.9 %
Eosinophils Absolute: 162 cells/uL (ref 15–500)
HEMATOCRIT: 39.3 % (ref 35.0–45.0)
HEMOGLOBIN: 13.1 g/dL (ref 11.7–15.5)
LYMPHS ABS: 2210 {cells}/uL (ref 850–3900)
MCH: 29.1 pg (ref 27.0–33.0)
MCHC: 33.3 g/dL (ref 32.0–36.0)
MCV: 87.3 fL (ref 80.0–100.0)
MPV: 10 fL (ref 7.5–12.5)
Monocytes Relative: 7.7 %
NEUTROS ABS: 5432 {cells}/uL (ref 1500–7800)
Neutrophils Relative %: 63.9 %
Platelets: 289 10*3/uL (ref 140–400)
RBC: 4.5 10*6/uL (ref 3.80–5.10)
RDW: 16.6 % — ABNORMAL HIGH (ref 11.0–15.0)
Total Lymphocyte: 26 %
WBC: 8.5 10*3/uL (ref 3.8–10.8)
WBCMIX: 655 {cells}/uL (ref 200–950)

## 2018-02-26 DIAGNOSIS — F4320 Adjustment disorder, unspecified: Secondary | ICD-10-CM | POA: Diagnosis not present

## 2018-03-04 ENCOUNTER — Ambulatory Visit: Payer: BLUE CROSS/BLUE SHIELD | Admitting: Dietician

## 2018-03-11 DIAGNOSIS — F4320 Adjustment disorder, unspecified: Secondary | ICD-10-CM | POA: Diagnosis not present

## 2018-03-12 NOTE — Progress Notes (Signed)
Office Visit Note  Patient: Donna Lopez             Date of Birth: 1966/04/11           MRN: [id number]             PCP: Patient, No Pcp Per Referring: Donato Schultz, * Visit Date: 03/25/2018 Occupation: @GUAROCC @  Subjective:  Left knee pain and neck pain  History of Present Illness: Donna Lopez is a 52 y.o. female with history of rheumatoid arthritis and osteoarthritis.  She is on methotrexate 8 tablets by mouth once weekly, folic acid 2 mg by mouth daily, and Humira 40 mg subcutaneous injections every 2 weeks.  She denies any recent rheumatoid arthritis flares.  She states that she is currently having neck pain which she experienced is periodically.  She states her neck pain typically lasts for several days and then resolves on its own.  She states that her biggest concern is her left knee pain.  She states the pain is been worsening over the past few months.  She states that she has swelling and warmth in the left knee joint.  She states that the left knee will buckle as well as feeling a catching sensation at times.  She denies any recent falls and or injuries. She has had Visco gel injections in the past.  She had a left knee cortisone injection performed by Dr. 40 on 01/01/2018 that only provided relief for 3 weeks.  She has tried applying magnesium cream topically as well as him cream that provides temporary relief.  She also uses ice as needed.  She plans on having a left knee replacement in the next 6 to 12 months.  She would like to reapply for Visco gel injections for the right knee joint.  Activities of Daily Living:  Patient reports morning stiffness for 3 minutes.   Patient Denies nocturnal pain.  Difficulty dressing/grooming: Denies Difficulty climbing stairs: Reports Difficulty getting out of chair: Denies Difficulty using hands for taps, buttons, cutlery, and/or writing: Denies  Review of Systems  Constitutional: Negative for fatigue.  HENT: Negative  for mouth dryness.   Eyes: Negative for dryness.  Respiratory: Negative for shortness of breath.   Cardiovascular: Positive for swelling in legs/feet.  Gastrointestinal: Negative for constipation.  Endocrine: Negative for increased urination.  Genitourinary: Negative for difficulty urinating.  Musculoskeletal: Positive for arthralgias, joint pain, joint swelling and morning stiffness.  Skin: Negative for rash.  Neurological: Negative for weakness.  Hematological: Negative for bruising/bleeding tendency.  Psychiatric/Behavioral: Negative for sleep disturbance.    PMFS History:  Patient Active Problem List   Diagnosis Date Noted  . Primary osteoarthritis of both knees 12/24/2016  . 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 DDD (degenerative disc disease), lumbar 11/17/2016  . Vitamin D deficiency 11/17/2016  . History of rosacea/ History of acne 11/17/2016  . High risk medications (not anticoagulants) long-term use 06/25/2016  . History of degenerative disc disease 06/25/2016  . Severe obesity (BMI >= 40) (HCC) 06/13/2014  . Left-sided thoracic back pain 07/19/2013  . PALPITATIONS 10/17/2009  . UTI 05/24/2009  . Rheumatoid arthritis (HCC) 05/24/2009  . Hair loss 02/22/2009  . VAGINAL BLEEDING 10/05/2008  . Carrier of group B Streptococcus 07/28/2007  . GERD 12/28/2006  . CARDIAC MURMUR, HX OF 12/28/2006  . HIATAL HERNIA, HX OF 12/28/2006    Past Medical History:  Diagnosis Date  . Allergy   . Arthritis   .  GERD (gastroesophageal reflux disease)   . Heart murmur   . Rheumatoid arthritis (HCC)   . 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 Uterine cancer Mother   . Diabetes Father   . Kidney cancer Father   . Colon cancer Sister 63  . Rectal cancer Sister   . Colon cancer Paternal Grandfather   . Colon polyps Brother   . Cancer Sister        breast   . Healthy Daughter   . Esophageal cancer Neg Hx   . Stomach cancer  Neg Hx    Past Surgical History:  Procedure Laterality Date  . COLONOSCOPY  2014   DB- normal   . NO PAST SURGERIES    . UPPER GASTROINTESTINAL ENDOSCOPY     Social History   Social History Narrative   Exercise--- sometimes walks 1x a week    Objective: Vital Signs: BP 131/80 (BP Location: Left Arm, Patient Position: Sitting, Cuff Size: Normal)   Pulse (!) 102   Resp 16   Ht 5\' 3"  (1.6 m)   Wt 239 lb (108.4 kg)   BMI 42.34 kg/m    Physical Exam  Constitutional: She is oriented to person, place, and time. She appears well-developed and well-nourished.  HENT:  Head: Normocephalic and atraumatic.  Eyes: Conjunctivae and EOM are normal.  Neck: Normal range of motion.  Cardiovascular: Normal rate, regular rhythm, normal heart sounds and intact distal pulses.  Pulmonary/Chest: Effort normal and breath sounds normal.  Abdominal: Soft. Bowel sounds are normal.  Lymphadenopathy:    She has no cervical adenopathy.  Neurological: She is alert and oriented to person, place, and time.  Skin: Skin is warm and dry. Capillary refill takes less than 2 seconds.  Psychiatric: She has a normal mood and affect. Her behavior is normal.  Nursing note and vitals reviewed.    Musculoskeletal Exam:C-spine good ROM. Mild postural thoracic kyphosis noted. No midline spinal tenderness.  No SI joint tenderness.  Shoulder joints, elbow joints, wrist joints, MCPs, PIPs, and DIPs good ROM with no synovitis.  She has complete fist formation bilaterally.  Hip joints, knee joints, ankle joints, MTPs, PIPs, and DIPs good ROM with no synovitis. She has warmth and limited extension of the left knee joint on exam. Mild varus deformity of left knee joint.  Left ankle mild swelling noted.  No tenderness of trochanteric bursa bilaterally.   CDAI Exam: CDAI Score: Not documented Patient Global Assessment: Not documented; Provider Global Assessment: Not documented Swollen: Not documented; Tender: Not  documented Joint Exam   Not documented   There is currently no information documented on the homunculus. Go to the Rheumatology activity and complete the homunculus joint exam.  Investigation: No additional findings.  Imaging: Xr Foot 2 Views Left  Result Date: 03/25/2018 Juxta-articular osteopenia was noted.  First PIP narrowing and erosive changes were noted.  Fifth MTP narrowing and erosive changes were noted.  MTP, PIP and DIP narrowing was noted.  Dorsal spurring was noted.  No significant subtalar joint space narrowing was noted.  Posterior and inferior calcaneal spurs were noted.  No interval change was noted from the x-rays of September 03, 2015. 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  Xr Foot 2 Views Right  Result Date: 03/25/2018 Juxta-articular osteopenia was noted.  First MTP PIP and DIP narrowing was noted.  All MTP narrowing was noted.  Erosive changes were noted in  the fifth MTP joint.  No intertarsal joint space or subtalar joint space narrowing was noted.  Calcaneal spur was noted.  Dorsal spurring was noted.  No interval change was noted from the x-rays of September 03, 2015. 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  Xr Hand 2 View Left  Result Date: 03/25/2018 Juxta-articular osteopenia was noted.  Narrowing of all MCP joints especially the second MCP joint was noted.  PIP/DIP joint space narrowing was noted.  Intercarpal and radiocarpal severe joint space narrowing was noted.  Erosive changes were noted in the carpal bones. 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  There was no interval change from the x-rays of September 03, 2015.  Xr Hand 2 View Right  Result Date: 03/25/2018 Juxta-articular osteopenia was noted.  Narrowing of all MCP joints was noted.  PIP and DIP joint space narrowing was  noted.  Severe intercarpal radiocarpal joint space narrowing with erosive changes were noted.  There was no interval change from the x-rays of September 03, 2015. 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   Recent Labs: Lab Results  Component Value Date   WBC 8.5 02/10/2018   HGB 13.1 02/10/2018   PLT 289 02/10/2018   NA 139 02/10/2018   K 4.4 02/10/2018   CL 104 02/10/2018   CO2 27 02/10/2018   GLUCOSE 94 02/10/2018   BUN 12 02/10/2018   CREATININE 0.57 02/10/2018   BILITOT 0.3 02/10/2018   ALKPHOS 77 08/10/2017   AST 19 02/10/2018   ALT 15 02/10/2018   PROT 7.0 02/10/2018   ALBUMIN 3.8 08/10/2017   CALCIUM 9.3 02/10/2018   GFRAA 123 02/10/2018   QFTBGOLDPLUS NEGATIVE 10/22/2017    Speciality Comments: No specialty comments available.  Procedures:  No procedures performed Allergies: Penicillins   Assessment / Plan:     Visit Diagnoses: Rheumatoid arthritis involving multiple sites with positive rheumatoid factor-positive rheumatoid factor, positive anti-CCP, erosive disease: She has no synovitis on exam.  She has not had any recent rheumatoid arthritis flares.  She has chronic left knee pain, and she plans on scheduling a left total knee replacement within the next 6 to 12 months.  She was given a prescription for Voltaren gel which she can apply topically up to 3 times daily.  She is no other joint pain or joint swelling at this time.  She is clinically doing well on Humira 40 mg subcutaneous injections every 2 weeks, methotrexate tablets by mouth once weekly and folic acid 2 mg by mouth daily.  She does not need any refills at this time.  X-rays of both hands and feet were obtained today to assess for radiographic progression.  She is advised to notify us if she does increased joint pain or joint swelling.  She will follow-up in the office in 5 months.  High risk medication use-Current regimen includes Humira 40mg /mL  subq every 14 days, Methotrexate 20 mg po weekly, and folic acid 2 mg po qd.  Last TB gold negative 10/22/17.  Most recent CBC/CMP within normal limits on 02/10/18.  Next CBC/CMP due end of December and standing orders are in place.  I recommended receiving the yearly influenza vaccine.   Primary osteoarthritis of both knees-she has chronic pain in bilateral knee joints.  The pain is most severe in the left knee and has been worsening over the past several months.  In the past she had Visco  gel injections.  She had a cortisone injection performed by Dr. Cleophas Dunker on 01/01/2018 that provided temporary relief lasting about 3 weeks.  She has warmth and discomfort with limited extension of the left knee joint.  On x-ray from August 2019 she has bone-on-bone of the medial compartment advanced osteoarthritis.  Varus deformity noted.  Due to the significant pain she has been experiencing she plans on having any replacement within the next 6 to 12 months.  She was given a prescription for Voltaren gel which she can apply topically to 3 large joints up to 3 times daily.  She was also given a good Rx coupon today in the office.   We discussed applying for Visco gel injections for the right knee joint.   DDD thoracic- No midline spinal tenderness.  She has mild postural kyphosis.   DDD lumbar-She has good range of motion with no discomfort.  No midline spinal tenderness.  History of vitamin D deficiency-She takes a calcium and vitamin D supplement daily.   Other medical conditions are listed as follows:   History of gastroesophageal reflux-  History of hiatal hernia-  History of obesity-  History of rosacea-   Orders: Orders Placed This Encounter  Procedures  . XR Foot 2 Views Right  . XR Foot 2 Views Left  . XR Hand 2 View Right  . XR Hand 2 View Left   Meds ordered this encounter  Medications  . diclofenac sodium (VOLTAREN) 1 % GEL    Sig: Apply 3 grams to 3 large joints up to 3 times daily     Dispense:  3 Tube    Refill:  3    Face-to-face time spent with patient was 30 minutes. Greater than 50% of time was spent in counseling and coordination of care.  Follow-Up Instructions: Return in about 5 months (around 08/24/2018) for Rheumatoid arthritis, Osteoarthritis.   Sherron Ales PA-C   I examined and evaluated the patient with Sherron Ales PA.  Patient had no synovitis on my examination although she continues to have some arthralgias from underlying osteoarthritis and degenerative disc disease.  We obtain x-rays of bilateral hands and bilateral feet which did not show any progression of the disease process.  The plan of care was discussed as noted above.   Pollyann Savoy, MD  Note - This record has been created using Animal nutritionist.  Chart creation errors have been sought, but may not always  have been located. Such creation errors do not reflect on  the standard of medical care.

## 2018-03-17 ENCOUNTER — Encounter: Payer: Self-pay | Admitting: Dietician

## 2018-03-17 ENCOUNTER — Encounter: Payer: BLUE CROSS/BLUE SHIELD | Attending: Obstetrics and Gynecology | Admitting: Dietician

## 2018-03-17 DIAGNOSIS — Z713 Dietary counseling and surveillance: Secondary | ICD-10-CM | POA: Insufficient documentation

## 2018-03-17 DIAGNOSIS — R7303 Prediabetes: Secondary | ICD-10-CM | POA: Insufficient documentation

## 2018-03-17 NOTE — Progress Notes (Signed)
Medical Nutrition Therapy  Appt Start Time: 4:25pm End Time: 5:10pm  Primary concerns today: prediabetes (diagnosed August 2019)   NUTRITION ASSESSMENT   Anthropometrics  (01-07-18 per MR) Weight: 239.7 lbs  Height: 62 in   BMI: 43.84   Biochemical Data & Labs Hgb A1c: 5.8% (prediabetes: 5.7 - 6.4%)  Clinical Medical Hx: obesity, GERD Surgeries: none Allergies: Penicillin  Medications: Humira, Methotrexate, Nexium Supplements: folic acid, vitamin D, calcium, lysine   Psychosocial/Lifestyle Works in [employer].   24-Hr Dietary Recall First Meal: boiled egg // breakfast bar  Snack: banana Second Meal: lean cuisine // burger + side // [place]  Snack: mixed nuts // fruit Third Meal: casserole // pork chops + sides Snack: cookies // ice cream sandwich  Beverages: water // coke zero // hint water   Food & Nutrition Related Hx Dietary Hx: Pt states she has a sweet tooth and likes anything with sugar. Pt states she is more likely to cook on the weekends and eat out during the week. Pt states that where she works is relatively far away from home so she does not much time to cook. Pt states her GERD symptoms are worse after bigger meals at night and/or after eating greasy foods, chocolate, and caffeine.   Estimated Daily Fluid Intake: 60 oz GI / Other Notable Symptoms: none   Physical Activity  Current average weekly physical activity: none (anticipates having knee surgery early summer 2020)  Encouraged to engage in 150 minutes of moderate physical activity including cardiovascular and weight baring weekly.   Estimated Energy Needs Calories: 1600 Carbohydrate: 180g Protein: 100g Fat: 53g   NUTRITION DIAGNOSIS  Altered nutrition related laboratory values ([place]-2.2) related to prediabetes as evidenced by Hgb A1c level between 5.7 - 6.4% (5.8%), food and nutrition related knowledge deficit, and estimated intake of foods not meeting nutrition recommendations.     NUTRITION INTERVENTION  Nutrition education (N-1) on topics including:   Prediabetes: what it is, A1c levels, how to manage blood glucose levels (diet, exercise, weight management), etc.   General healthful diet: food groups, ideal food choices from each, how foods affect blood glucose levels, meal/snack ideas   GERD nutrition therapy: tips for managing symptoms including foods to avoid   Handouts Provided Include   Diabetes Types & Blood Glucose Control   Meal Ideas   Low Carbohydrates Snack Suggestions   Carb Counts per Meal/Snack (yellow card)   Know Your Numbers   Planning Healthy Meals   Learning Style & Readiness for Change Teaching method utilized: Visual & Auditory  Demonstrated degree of understanding via: Teach Back  Barriers to learning/adherence to lifestyle change: none identified   MONITORING & EVALUATION Dietary intake, weekly physical activity, and Hgb A1c / blood glucose levels.   Next Steps  Patient is to follow up with [facility] for follow up as needed/desired.

## 2018-03-25 ENCOUNTER — Ambulatory Visit (INDEPENDENT_AMBULATORY_CARE_PROVIDER_SITE_OTHER): Payer: Self-pay

## 2018-03-25 ENCOUNTER — Encounter: Payer: Self-pay | Admitting: Rheumatology

## 2018-03-25 ENCOUNTER — Ambulatory Visit: Payer: BLUE CROSS/BLUE SHIELD | Admitting: Rheumatology

## 2018-03-25 ENCOUNTER — Telehealth: Payer: Self-pay | Admitting: *Deleted

## 2018-03-25 VITALS — BP 131/80 | HR 102 | Resp 16 | Ht 63.0 in | Wt 239.0 lb

## 2018-03-25 DIAGNOSIS — Z79899 Other long term (current) drug therapy: Secondary | ICD-10-CM

## 2018-03-25 DIAGNOSIS — M5134 Other intervertebral disc degeneration, thoracic region: Secondary | ICD-10-CM | POA: Diagnosis not present

## 2018-03-25 DIAGNOSIS — M170 Bilateral primary osteoarthritis of knee: Secondary | ICD-10-CM | POA: Diagnosis not present

## 2018-03-25 DIAGNOSIS — Z872 Personal history of diseases of the skin and subcutaneous tissue: Secondary | ICD-10-CM

## 2018-03-25 DIAGNOSIS — Z8719 Personal history of other diseases of the digestive system: Secondary | ICD-10-CM

## 2018-03-25 DIAGNOSIS — Z8639 Personal history of other endocrine, nutritional and metabolic disease: Secondary | ICD-10-CM

## 2018-03-25 DIAGNOSIS — M0579 Rheumatoid arthritis with rheumatoid factor of multiple sites without organ or systems involvement: Secondary | ICD-10-CM | POA: Diagnosis not present

## 2018-03-25 DIAGNOSIS — M5136 Other intervertebral disc degeneration, lumbar region: Secondary | ICD-10-CM

## 2018-03-25 MED ORDER — DICLOFENAC SODIUM 1 % TD GEL: TRANSDERMAL | 3 refills | Status: DC

## 2018-03-25 NOTE — Patient Instructions (Addendum)
Standing Labs We placed an order today for your standing lab work.    Please come back and get your standing labs in December and every 3 months   We have open lab Monday through Friday from 8:30-11:30 AM and 1:30-4:00 PM  at the office of Dr. Riyansh Gerstner.   You may experience shorter wait times on Monday and Friday afternoons. The office is located at [address], [address], [place], [place] [address] No appointment is necessary.   Labs are drawn by [facility].  You may receive a bill from [facility] for your lab work. If you have any questions regarding directions or hours of operation,  please call [phone].   Just as a reminder please drink plenty of water prior to coming for your lab work. Thanks!  Knee Exercises Ask your health care provider which exercises are safe for you. Do exercises exactly as told by your health care provider and adjust them as directed. It is normal to feel mild stretching, pulling, tightness, or discomfort as you do these exercises, but you should stop right away if you feel sudden pain or your pain gets worse.Do not begin these exercises until told by your health care provider. STRETCHING AND RANGE OF MOTION EXERCISES These exercises warm up your muscles and joints and improve the movement and flexibility of your knee. These exercises also help to relieve pain, numbness, and tingling. Exercise A: Knee Extension, Prone 1. Lie on your abdomen on a bed. 2. Place your left / right knee just beyond the edge of the surface so your knee is not on the bed. You can put a towel under your left / right thigh just above your knee for comfort. 3. Relax your leg muscles and allow gravity to straighten your knee. You should feel a stretch behind your left / right knee. 4. Hold this position for __________ seconds. 5. Scoot up so your knee is supported between repetitions. Repeat __________ times. Complete this stretch __________ times a day. Exercise B: Knee Flexion,  Active  1. Lie on your back with both knees straight. If this causes back discomfort, bend your left / right knee so your foot is flat on the floor. 2. Slowly slide your left / right heel back toward your buttocks until you feel a gentle stretch in the front of your knee or thigh. 3. Hold this position for __________ seconds. 4. Slowly slide your left / right heel back to the starting position. Repeat __________ times. Complete this exercise __________ times a day. Exercise C: Quadriceps, Prone  1. Lie on your abdomen on a firm surface, such as a bed or padded floor. 2. Bend your left / right knee and hold your ankle. If you cannot reach your ankle or pant leg, loop a belt around your foot and grab the belt instead. 3. Gently pull your heel toward your buttocks. Your knee should not slide out to the side. You should feel a stretch in the front of your thigh and knee. 4. Hold this position for __________ seconds. Repeat __________ times. Complete this stretch __________ times a day. Exercise D: Hamstring, Supine 1. Lie on your back. 2. Loop a belt or towel over the ball of your left / right foot. The ball of your foot is on the walking surface, right under your toes. 3. Straighten your left / right knee and slowly pull on the belt to raise your leg until you feel a gentle stretch behind your knee. ? Do not let   your left / right knee bend while you do this. ? Keep your other leg flat on the floor. 4. Hold this position for __________ seconds. Repeat __________ times. Complete this stretch __________ times a day. STRENGTHENING EXERCISES These exercises build strength and endurance in your knee. Endurance is the ability to use your muscles for a long time, even after they get tired. Exercise E: Quadriceps, Isometric  1. Lie on your back with your left / right leg extended and your other knee bent. Put a rolled towel or small pillow under your knee if told by your health care  provider. 2. Slowly tense the muscles in the front of your left / right thigh. You should see your kneecap slide up toward your hip or see increased dimpling just above the knee. This motion will push the back of the knee toward the floor. 3. For __________ seconds, keep the muscle as tight as you can without increasing your pain. 4. Relax the muscles slowly and completely. Repeat __________ times. Complete this exercise __________ times a day. Exercise F: Straight Leg Raises - Quadriceps 1. Lie on your back with your left / right leg extended and your other knee bent. 2. Tense the muscles in the front of your left / right thigh. You should see your kneecap slide up or see increased dimpling just above the knee. Your thigh may even shake a bit. 3. Keep these muscles tight as you raise your leg 4-6 inches (10-15 cm) off the floor. Do not let your knee bend. 4. Hold this position for __________ seconds. 5. Keep these muscles tense as you lower your leg. 6. Relax your muscles slowly and completely after each repetition. Repeat __________ times. Complete this exercise __________ times a day. Exercise G: Hamstring, Isometric 1. Lie on your back on a firm surface. 2. Bend your left / right knee approximately __________ degrees. 3. Dig your left / right heel into the surface as if you are trying to pull it toward your buttocks. Tighten the muscles in the back of your thighs to dig as hard as you can without increasing any pain. 4. Hold this position for __________ seconds. 5. Release the tension gradually and allow your muscles to relax completely for __________ seconds after each repetition. Repeat __________ times. Complete this exercise __________ times a day. Exercise H: Hamstring Curls  If told by your health care provider, do this exercise while wearing ankle weights. Begin with __________ weights. Then increase the weight by 1 lb (0.5 kg) increments. Do not wear ankle weights that are more than  __________. 1. Lie on your abdomen with your legs straight. 2. Bend your left / right knee as far as you can without feeling pain. Keep your hips flat against the floor. 3. Hold this position for __________ seconds. 4. Slowly lower your leg to the starting position.  Repeat __________ times. Complete this exercise __________ times a day. Exercise I: Squats (Quadriceps) 1. Stand in front of a table, with your feet and knees pointing straight ahead. You may rest your hands on the table for balance but not for support. 2. Slowly bend your knees and lower your hips like you are going to sit in a chair. ? Keep your weight over your heels, not over your toes. ? Keep your lower legs upright so they are parallel with the table legs. ? Do not let your hips go lower than your knees. ? Do not bend lower than told by your health care provider. ?   If your knee pain increases, do not bend as low. 3. Hold the squat position for __________ seconds. 4. Slowly push with your legs to return to standing. Do not use your hands to pull yourself to standing. Repeat __________ times. Complete this exercise __________ times a day. Exercise J: Wall Slides (Quadriceps)  1. Lean your back against a smooth wall or door while you walk your feet out 18-24 inches (46-61 cm) from it. 2. Place your feet hip-width apart. 3. Slowly slide down the wall or door until your knees bend __________ degrees. Keep your knees over your heels, not over your toes. Keep your knees in line with your hips. 4. Hold for __________ seconds. Repeat __________ times. Complete this exercise __________ times a day. Exercise K: Straight Leg Raises - Hip Abductors 1. Lie on your side with your left / right leg in the top position. Lie so your head, shoulder, knee, and hip line up. You may bend your bottom knee to help you keep your balance. 2. Roll your hips slightly forward so your hips are stacked directly over each other and your left / right  knee is facing forward. 3. Leading with your heel, lift your top leg 4-6 inches (10-15 cm). You should feel the muscles in your outer hip lifting. ? Do not let your foot drift forward. ? Do not let your knee roll toward the ceiling. 4. Hold this position for __________ seconds. 5. Slowly return your leg to the starting position. 6. Let your muscles relax completely after each repetition. Repeat __________ times. Complete this exercise __________ times a day. Exercise L: Straight Leg Raises - Hip Extensors 1. Lie on your abdomen on a firm surface. You can put a pillow under your hips if that is more comfortable. 2. Tense the muscles in your buttocks and lift your left / right leg about 4-6 inches (10-15 cm). Keep your knee straight as you lift your leg. 3. Hold this position for __________ seconds. 4. Slowly lower your leg to the starting position. 5. Let your leg relax completely after each repetition. Repeat __________ times. Complete this exercise __________ times a day. This information is not intended to replace advice given to you by your health care provider. Make sure you discuss any questions you have with your health care provider. Document Released: 03/19/2005 Document Revised: 01/28/2016 Document Reviewed: 03/11/2015 Elsevier Interactive Patient Education  2018 [employer]    

## 2018-03-25 NOTE — Telephone Encounter (Signed)
Please apply for visco for right knee for Donna Lopez. Thank you.

## 2018-03-26 NOTE — Telephone Encounter (Signed)
Noted  

## 2018-03-31 DIAGNOSIS — F4320 Adjustment disorder, unspecified: Secondary | ICD-10-CM | POA: Diagnosis not present

## 2018-04-02 ENCOUNTER — Other Ambulatory Visit: Payer: Self-pay | Admitting: Rheumatology

## 2018-04-02 NOTE — Telephone Encounter (Signed)
Last Visit: 03/25/18 Next Visit: 08/24/18 Labs: 02/10/18 WNL  Okay to refill per Dr. Deveshwar 

## 2018-04-03 ENCOUNTER — Other Ambulatory Visit: Payer: Self-pay | Admitting: Rheumatology

## 2018-04-05 NOTE — Telephone Encounter (Signed)
Last Visit: 03/25/18 Next Visit: 08/24/18 Labs: 02/10/18 WNL  Okay to refill per Dr. Corliss Skains

## 2018-04-06 ENCOUNTER — Telehealth (INDEPENDENT_AMBULATORY_CARE_PROVIDER_SITE_OTHER): Payer: Self-pay

## 2018-04-06 NOTE — Telephone Encounter (Signed)
Submitted VOB for Synvisc series, right knee. 

## 2018-04-13 ENCOUNTER — Telehealth (INDEPENDENT_AMBULATORY_CARE_PROVIDER_SITE_OTHER): Payer: Self-pay

## 2018-04-13 DIAGNOSIS — F4320 Adjustment disorder, unspecified: Secondary | ICD-10-CM | POA: Diagnosis not present

## 2018-04-13 NOTE — Telephone Encounter (Signed)
Patient's insurance, which is [employer], does not cover any gel injection.  Please advise.  Thank you.

## 2018-04-14 NOTE — Telephone Encounter (Signed)
You may notify patient.  She can only have cortisone injections if needed.

## 2018-04-19 NOTE — Telephone Encounter (Signed)
Patient advised that her insurance will not cover gel injections. Patient advised per Dr. Corliss Skains she can only have cortisone injections if needed.

## 2018-04-27 ENCOUNTER — Other Ambulatory Visit: Payer: Self-pay

## 2018-04-27 DIAGNOSIS — Z79899 Other long term (current) drug therapy: Secondary | ICD-10-CM

## 2018-04-27 DIAGNOSIS — F4320 Adjustment disorder, unspecified: Secondary | ICD-10-CM | POA: Diagnosis not present

## 2018-04-28 LAB — CBC WITH DIFFERENTIAL/PLATELET
BASOS ABS: 51 {cells}/uL (ref 0–200)
Basophils Relative: 0.6 %
Eosinophils Absolute: 187 cells/uL (ref 15–500)
Eosinophils Relative: 2.2 %
HEMATOCRIT: 41.3 % (ref 35.0–45.0)
Hemoglobin: 13.6 g/dL (ref 11.7–15.5)
LYMPHS ABS: 2406 {cells}/uL (ref 850–3900)
MCH: 29.2 pg (ref 27.0–33.0)
MCHC: 32.9 g/dL (ref 32.0–36.0)
MCV: 88.6 fL (ref 80.0–100.0)
MPV: 10.5 fL (ref 7.5–12.5)
Monocytes Relative: 7.9 %
NEUTROS PCT: 61 %
Neutro Abs: 5185 cells/uL (ref 1500–7800)
Platelets: 327 10*3/uL (ref 140–400)
RBC: 4.66 10*6/uL (ref 3.80–5.10)
RDW: 15.5 % — AB (ref 11.0–15.0)
Total Lymphocyte: 28.3 %
WBC: 8.5 10*3/uL (ref 3.8–10.8)
WBCMIX: 672 {cells}/uL (ref 200–950)

## 2018-04-28 LAB — COMPLETE METABOLIC PANEL WITH GFR
AG Ratio: 1 (calc) (ref 1.0–2.5)
ALT: 15 U/L (ref 6–29)
AST: 18 U/L (ref 10–35)
Albumin: 3.7 g/dL (ref 3.6–5.1)
Alkaline phosphatase (APISO): 87 U/L (ref 33–130)
BUN: 13 mg/dL (ref 7–25)
CO2: 27 mmol/L (ref 20–32)
Calcium: 9.2 mg/dL (ref 8.6–10.4)
Chloride: 102 mmol/L (ref 98–110)
Creat: 0.56 mg/dL (ref 0.50–1.05)
GFR, EST AFRICAN AMERICAN: 123 mL/min/{1.73_m2} (ref >=60)
GFR, EST NON AFRICAN AMERICAN: 106 mL/min/{1.73_m2} (ref >=60)
Globulin: 3.6 g/dL (calc) (ref 1.9–3.7)
Glucose, Bld: 113 mg/dL — ABNORMAL HIGH (ref 65–99)
POTASSIUM: 4.1 mmol/L (ref 3.5–5.3)
SODIUM: 137 mmol/L (ref 135–146)
TOTAL PROTEIN: 7.3 g/dL (ref 6.1–8.1)
Total Bilirubin: 0.3 mg/dL (ref 0.2–1.2)

## 2018-05-19 HISTORY — PX: KNEE ARTHROPLASTY: SHX992

## 2018-05-20 DIAGNOSIS — F4320 Adjustment disorder, unspecified: Secondary | ICD-10-CM | POA: Diagnosis not present

## 2018-05-22 ENCOUNTER — Encounter: Payer: Self-pay | Admitting: Rheumatology

## 2018-05-27 ENCOUNTER — Ambulatory Visit (INDEPENDENT_AMBULATORY_CARE_PROVIDER_SITE_OTHER): Payer: BLUE CROSS/BLUE SHIELD | Admitting: Orthopaedic Surgery

## 2018-05-27 ENCOUNTER — Encounter (INDEPENDENT_AMBULATORY_CARE_PROVIDER_SITE_OTHER): Payer: Self-pay | Admitting: Orthopaedic Surgery

## 2018-05-27 VITALS — BP 118/88 | HR 95 | Resp 14 | Ht 62.0 in | Wt 225.0 lb

## 2018-05-27 DIAGNOSIS — M1712 Unilateral primary osteoarthritis, left knee: Secondary | ICD-10-CM

## 2018-05-27 MED ORDER — METHYLPREDNISOLONE ACETATE 40 MG/ML IJ SUSP
80.0000 mg | INTRAMUSCULAR | Status: AC | PRN
  Administered 2018-05-27: 80 mg

## 2018-05-27 MED ORDER — LIDOCAINE HCL 1 % IJ SOLN
2.0000 mL | INTRAMUSCULAR | Status: AC | PRN
  Administered 2018-05-27: 2 mL

## 2018-05-27 MED ORDER — BUPIVACAINE HCL 0.5 % IJ SOLN
2.0000 mL | INTRAMUSCULAR | Status: AC | PRN
  Administered 2018-05-27: 2 mL via INTRA_ARTICULAR

## 2018-05-27 NOTE — Progress Notes (Addendum)
Office Visit Note   Patient: Donna Lopez           Date of Birth: 04-May-1966           MRN: [id number] Visit Date: 05/27/2018              Requested by: No referring provider defined for this encounter. PCP: Patient, No Pcp Per   Assessment & Plan: Visit Diagnoses:  1. Unilateral primary osteoarthritis, left knee     Plan:  #1: Corticosteroid injection to the left knee was accomplished atraumatically #2: She will plan in the future for a total knee arthroplasty at her convenience.  She is looking some time in June or so.  Follow-Up Instructions: Return if symptoms worsen or fail to improve.   Orders:  No orders of the defined types were placed in this encounter.  No orders of the defined types were placed in this encounter.     Procedures: Large Joint Inj: L knee on 05/27/2018 12:34 PM Indications: pain and diagnostic evaluation Details: 25 G 1.5 in needle, anteromedial approach  Arthrogram: No  Medications: 2 mL lidocaine 1 %; 2 mL bupivacaine 0.5 %; 80 mg methylPREDNISolone acetate 40 MG/ML Procedure, treatment alternatives, risks and benefits explained, specific risks discussed. Consent was given by the patient. Patient was prepped and draped in the usual sterile fashion.       Clinical Data: No additional findings.   Subjective: Chief Complaint  Patient presents with  . Left Knee - Pain    HPI:  Donna Lopez presents in the office today with left knee pain. Patient denies injury or fall. Patient has had visco injections in the past but her insurance will no longer cover them. Patient states her knee has been "buckling, popping and grinding." Patient states she is interested in the knee brace that was mentioned at her last office visit and would also like an injection.    Review of Systems  Constitutional: Negative for fatigue and fever.  HENT: Negative for ear pain.   Eyes: Negative for pain.  Respiratory: Negative for cough and shortness of  breath.   Cardiovascular: Positive for leg swelling.  Gastrointestinal: Negative for constipation and diarrhea.  Genitourinary: Negative for difficulty urinating.  Musculoskeletal: Negative for back pain and neck pain.  Skin: Negative for rash.  Allergic/Immunologic: Negative for food allergies.  Neurological: Positive for weakness. Negative for numbness.  Hematological: Does not bruise/bleed easily.  Psychiatric/Behavioral: Positive for sleep disturbance.       Objective: Vital Signs: BP 118/88 (BP Location: Left Arm, Patient Position: Sitting, Cuff Size: Normal)   Pulse 95   Resp 14   Ht 5\' 2"  (1.575 m)   Wt 225 lb (102.1 kg)   BMI 41.15 kg/m   Physical Exam Constitutional:      Appearance: She is well-developed.  Eyes:     Pupils: Pupils are equal, round, and reactive to light.  Pulmonary:     Effort: Pulmonary effort is normal.  Skin:    General: Skin is warm and dry.  Neurological:     Mental Status: She is alert and oriented to person, place, and time.  Psychiatric:        Behavior: Behavior normal.     Ortho Exam  Exam today reveals skin to be intact.  She does have a mild to moderate effusion.  Not tense.  Crepitance with range of motion.  Pseudolaxity is also noted with varus and valgus stressing but has a good endpoint.  Specialty Comments:  No specialty comments available.  Imaging: No results found.   PMFS History: Patient Active Problem List   Diagnosis Date Noted  . Primary osteoarthritis of both knees 12/24/2016  . 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 DDD (degenerative disc disease), lumbar 11/17/2016  . Vitamin D deficiency 11/17/2016  . History of rosacea/ History of acne 11/17/2016  . High risk medications (not anticoagulants) long-term use 06/25/2016  . History of degenerative disc disease 06/25/2016  . Severe obesity (BMI >= 40) (HCC) 06/13/2014  . Left-sided thoracic back pain 07/19/2013  . PALPITATIONS 10/17/2009    . UTI 05/24/2009  . Rheumatoid arthritis (HCC) 05/24/2009  . Hair loss 02/22/2009  . VAGINAL BLEEDING 10/05/2008  . Carrier of group B Streptococcus 07/28/2007  . GERD 12/28/2006  . CARDIAC MURMUR, HX OF 12/28/2006  . HIATAL HERNIA, HX OF 12/28/2006   Past Medical History:  Diagnosis Date  . Allergy   . Arthritis   . GERD (gastroesophageal reflux disease)   . Heart murmur   . Rheumatoid arthritis (HCC)   . 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 Uterine cancer Mother   . Diabetes Father   . Kidney cancer Father   . Colon cancer Sister 1150  . Rectal cancer Sister   . Colon cancer Paternal Grandfather   . Colon polyps Brother   . Cancer Sister        breast   . Healthy Daughter   . Esophageal cancer Neg Hx   . Stomach cancer Neg Hx     Past Surgical History:  Procedure Laterality Date  . COLONOSCOPY  2014   DB- normal   . NO PAST SURGERIES    . 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 Smoking status: Never Smoker  . Smokeless tobacco: Never Used  Substance and Sexual Activity  . Alcohol use: Yes    Comment: rare alcohol use-once/twice a year  . Drug use: Never  . Sexual activity: Yes    Partners: Male

## 2018-05-31 ENCOUNTER — Other Ambulatory Visit: Payer: Self-pay | Admitting: *Deleted

## 2018-05-31 MED ORDER — METHOTREXATE 2.5 MG PO TABS: ORAL_TABLET | ORAL | 0 refills | Status: DC

## 2018-05-31 NOTE — Telephone Encounter (Signed)
Last Visit: 03/25/18 Next Visit: 08/24/18 Labs: 04/27/18 Glucose is 113. Rest of CMP WNL. CBC stable.  Okay to refill per Dr. Corliss Skains

## 2018-06-02 DIAGNOSIS — F4320 Adjustment disorder, unspecified: Secondary | ICD-10-CM | POA: Diagnosis not present

## 2018-06-23 DIAGNOSIS — F4320 Adjustment disorder, unspecified: Secondary | ICD-10-CM | POA: Diagnosis not present

## 2018-06-25 ENCOUNTER — Other Ambulatory Visit: Payer: Self-pay | Admitting: Rheumatology

## 2018-06-29 ENCOUNTER — Telehealth (INDEPENDENT_AMBULATORY_CARE_PROVIDER_SITE_OTHER): Payer: Self-pay | Admitting: Orthopaedic Surgery

## 2018-06-29 NOTE — Telephone Encounter (Signed)
Patient called stating Dr. Cleophas DunkerWhitfield told her to call back when she was ready to schedule surgery for her left knee.  Patient states "things have changed at work" and if possible schedule surgery  for 08/17/18 or 08/19/18.  Patient requested a return call.

## 2018-06-29 NOTE — Telephone Encounter (Signed)
Needs return to office to discuss-has BMI over 40. We are discouraged from operating on anyone over that number

## 2018-06-29 NOTE — Telephone Encounter (Signed)
Please call patient and schedule appt w/Dr. Whitfield. Thank you. 

## 2018-06-29 NOTE — Telephone Encounter (Signed)
Patient wants to move forward with a total knee placement. I left form on your desk to fill out.

## 2018-07-12 ENCOUNTER — Other Ambulatory Visit: Payer: Self-pay

## 2018-07-12 ENCOUNTER — Ambulatory Visit (INDEPENDENT_AMBULATORY_CARE_PROVIDER_SITE_OTHER): Payer: BLUE CROSS/BLUE SHIELD | Admitting: Orthopaedic Surgery

## 2018-07-12 ENCOUNTER — Encounter (INDEPENDENT_AMBULATORY_CARE_PROVIDER_SITE_OTHER): Payer: Self-pay | Admitting: Orthopaedic Surgery

## 2018-07-12 VITALS — BP 118/78 | HR 82 | Ht 62.5 in | Wt 231.0 lb

## 2018-07-12 DIAGNOSIS — Z79899 Other long term (current) drug therapy: Secondary | ICD-10-CM | POA: Diagnosis not present

## 2018-07-12 DIAGNOSIS — Z6841 Body Mass Index (BMI) 40.0 and over, adult: Secondary | ICD-10-CM | POA: Insufficient documentation

## 2018-07-12 DIAGNOSIS — M170 Bilateral primary osteoarthritis of knee: Secondary | ICD-10-CM | POA: Diagnosis not present

## 2018-07-12 NOTE — Progress Notes (Signed)
Office Visit Note   Patient: Donna Lopez           Date of Birth: 1965-05-28           MRN: [id number] Visit Date: 07/12/2018              Requested by: No referring provider defined for this encounter. PCP: Patient, No Pcp Per   Assessment & Plan: Visit Diagnoses:  1. BMI 40.0-44.9, adult (HCC)   2. Primary osteoarthritis of both knees     Plan: Long discussion regarding present problem with left knee.  Has advanced osteoarthritis with complete collapse of the medial compartment.  BMI is 42.  I think she should work losing 15 to 20 pounds with a BMI of closer to 39 and working to strengthen her both lower extremities.  Discussed all of the above and in detail and specifically addressing the potential complications with her present weight.  Also offered her another opinion if she would so wish  Follow-Up Instructions: Return in about 2 months (around 09/10/2018).   Orders:  No orders of the defined types were placed in this encounter.  No orders of the defined types were placed in this encounter.     Procedures: No procedures performed   Clinical Data: No additional findings.   Subjective: Chief Complaint  Patient presents with  . Left Knee - Pain  Patient presents today for left knee pain. She saw Arlys John one month ago and received a cortisone injection in her left knee, but saw minimal relief. She was told to think about knee arthroplasty. She is here to discuss that. She is taking Aleve or Ibuprofen. Cortisone injection about a month ago with only temporary relief.  Has had prior Visco supplementation but the insurance company will not approve it again.  Was told she needed to return to the office to discuss knee replacement.  She is thought about it a lot and would like to proceed over the next few months if possible.  HPI  Review of Systems   Objective: Vital Signs: BP 118/78   Pulse 82   Ht 5' 2.5" (1.588 m)   Wt 231 lb (104.8 kg)   BMI 41.58 kg/m    Physical Exam Constitutional:      Appearance: She is well-developed.  Eyes:     Pupils: Pupils are equal, round, and reactive to light.  Pulmonary:     Effort: Pulmonary effort is normal.  Skin:    General: Skin is warm and dry.  Neurological:     Mental Status: She is alert and oriented to person, place, and time.  Psychiatric:        Behavior: Behavior normal.     Ortho Exam awake alert and oriented x3.  Comfortable sitting.  Weight is 230 pounds with a BMI about 42.  Has lost 30 pounds over a number of months.  Does have increased varus of her left knee with mostly medial joint pain but some patellar crepitation no swelling distally.  Motor exam intact.  Lacks just a few degrees to full extension and flexed over 100 degrees without instability  Specialty Comments:  No specialty comments available.  Imaging: No results found.   PMFS History: Patient Active Problem List   Diagnosis Date Noted  . BMI 40.0-44.9, adult (HCC) 07/12/2018  . Primary osteoarthritis of both knees 12/24/2016  . 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 DDD (degenerative disc disease), lumbar 11/17/2016  . Vitamin D deficiency 11/17/2016  .  History of rosacea/ History of acne 11/17/2016  . High risk medications (not anticoagulants) long-term use 06/25/2016  . History of degenerative disc disease 06/25/2016  . Severe obesity (BMI >= 40) (HCC) 06/13/2014  . Left-sided thoracic back pain 07/19/2013  . PALPITATIONS 10/17/2009  . UTI 05/24/2009  . Rheumatoid arthritis (HCC) 05/24/2009  . Hair loss 02/22/2009  . VAGINAL BLEEDING 10/05/2008  . Carrier of group B Streptococcus 07/28/2007  . GERD 12/28/2006  . CARDIAC MURMUR, HX OF 12/28/2006  . HIATAL HERNIA, HX OF 12/28/2006   Past Medical History:  Diagnosis Date  . Allergy   . Arthritis   . GERD (gastroesophageal reflux disease)   . Heart murmur   . Rheumatoid arthritis (HCC)   . 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     Family History   Problem Relation Age of Onset  . Uterine cancer Mother   . Diabetes Father   . Kidney cancer Father   . Colon cancer Sister 25  . Rectal cancer Sister   . Colon cancer Paternal Grandfather   . Colon polyps Brother   . Cancer Sister        breast   . Healthy Daughter   . Esophageal cancer Neg Hx   . Stomach cancer Neg Hx     Past Surgical History:  Procedure Laterality Date  . COLONOSCOPY  2014   DB- normal   . NO PAST SURGERIES    . 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 Smoking status: Never Smoker  . Smokeless tobacco: Never Used  Substance and Sexual Activity  . Alcohol use: Yes    Comment: rare alcohol use-once/twice a year  . Drug use: Never  . Sexual activity: Yes    Partners: Male

## 2018-07-13 LAB — CBC WITH DIFFERENTIAL/PLATELET
Absolute Monocytes: 956 cells/uL — ABNORMAL HIGH (ref 200–950)
BASOS PCT: 0.4 %
Basophils Absolute: 36 cells/uL (ref 0–200)
EOS PCT: 2.2 %
Eosinophils Absolute: 200 cells/uL (ref 15–500)
HEMATOCRIT: 38.2 % (ref 35.0–45.0)
HEMOGLOBIN: 12.8 g/dL (ref 11.7–15.5)
LYMPHS ABS: 2594 {cells}/uL (ref 850–3900)
MCH: 28.9 pg (ref 27.0–33.0)
MCHC: 33.5 g/dL (ref 32.0–36.0)
MCV: 86.2 fL (ref 80.0–100.0)
MONOS PCT: 10.5 %
MPV: 10.2 fL (ref 7.5–12.5)
NEUTROS ABS: 5314 {cells}/uL (ref 1500–7800)
Neutrophils Relative %: 58.4 %
Platelets: 299 10*3/uL (ref 140–400)
RBC: 4.43 10*6/uL (ref 3.80–5.10)
RDW: 15.4 % — ABNORMAL HIGH (ref 11.0–15.0)
Total Lymphocyte: 28.5 %
WBC: 9.1 10*3/uL (ref 3.8–10.8)

## 2018-07-13 LAB — COMPLETE METABOLIC PANEL WITH GFR
AG RATIO: 1.2 (calc) (ref 1.0–2.5)
ALT: 16 U/L (ref 6–29)
AST: 17 U/L (ref 10–35)
Albumin: 3.8 g/dL (ref 3.6–5.1)
Alkaline phosphatase (APISO): 85 U/L (ref 37–153)
BUN: 19 mg/dL (ref 7–25)
CALCIUM: 8.9 mg/dL (ref 8.6–10.4)
CO2: 26 mmol/L (ref 20–32)
CREATININE: 0.62 mg/dL (ref 0.50–1.05)
Chloride: 103 mmol/L (ref 98–110)
GFR, EST AFRICAN AMERICAN: 119 mL/min/{1.73_m2} (ref >=60)
GFR, EST NON AFRICAN AMERICAN: 103 mL/min/{1.73_m2} (ref >=60)
Globulin: 3.2 g/dL (calc) (ref 1.9–3.7)
Glucose, Bld: 86 mg/dL (ref 65–99)
POTASSIUM: 4.3 mmol/L (ref 3.5–5.3)
Sodium: 138 mmol/L (ref 135–146)
TOTAL PROTEIN: 7 g/dL (ref 6.1–8.1)
Total Bilirubin: 0.3 mg/dL (ref 0.2–1.2)

## 2018-07-14 DIAGNOSIS — F4320 Adjustment disorder, unspecified: Secondary | ICD-10-CM | POA: Diagnosis not present

## 2018-07-28 ENCOUNTER — Other Ambulatory Visit: Payer: Self-pay | Admitting: Rheumatology

## 2018-07-28 NOTE — Telephone Encounter (Signed)
Last Visit: 03/25/18 Next Visit: 08/24/18 Labs:07/12/18 CMP WNL. CBC stable.  Okay to refill per Dr. Deveshwar 

## 2018-08-05 ENCOUNTER — Other Ambulatory Visit: Payer: Self-pay | Admitting: Rheumatology

## 2018-08-05 NOTE — Telephone Encounter (Signed)
Last Visit: 03/25/18 Next Visit: 08/24/18 Labs:07/12/18 CMP WNL. CBC stable.  Okay to refill per Dr. Corliss Skains

## 2018-08-12 ENCOUNTER — Encounter: Payer: Self-pay | Admitting: Rheumatology

## 2018-08-12 ENCOUNTER — Other Ambulatory Visit: Payer: Self-pay

## 2018-08-12 ENCOUNTER — Ambulatory Visit: Payer: BLUE CROSS/BLUE SHIELD | Admitting: Rheumatology

## 2018-08-12 VITALS — BP 141/95 | HR 93 | Resp 14 | Ht 63.0 in | Wt 226.6 lb

## 2018-08-12 DIAGNOSIS — M0579 Rheumatoid arthritis with rheumatoid factor of multiple sites without organ or systems involvement: Secondary | ICD-10-CM

## 2018-08-12 DIAGNOSIS — M170 Bilateral primary osteoarthritis of knee: Secondary | ICD-10-CM | POA: Diagnosis not present

## 2018-08-12 DIAGNOSIS — Z8639 Personal history of other endocrine, nutritional and metabolic disease: Secondary | ICD-10-CM

## 2018-08-12 DIAGNOSIS — M5134 Other intervertebral disc degeneration, thoracic region: Secondary | ICD-10-CM | POA: Diagnosis not present

## 2018-08-12 DIAGNOSIS — Z79899 Other long term (current) drug therapy: Secondary | ICD-10-CM | POA: Diagnosis not present

## 2018-08-12 DIAGNOSIS — Z872 Personal history of diseases of the skin and subcutaneous tissue: Secondary | ICD-10-CM

## 2018-08-12 DIAGNOSIS — M5136 Other intervertebral disc degeneration, lumbar region: Secondary | ICD-10-CM

## 2018-08-12 DIAGNOSIS — Z8719 Personal history of other diseases of the digestive system: Secondary | ICD-10-CM

## 2018-08-12 MED ORDER — PREDNISONE 5 MG PO TABS: ORAL_TABLET | ORAL | 0 refills | Status: DC

## 2018-08-12 MED ORDER — ADALIMUMAB 40 MG/0.4ML ~~LOC~~ AJKT: 40.0000 mg | AUTO-INJECTOR | SUBCUTANEOUS | 0 refills | Status: DC

## 2018-08-12 MED ORDER — METHOTREXATE (PF) 20 MG/0.4ML ~~LOC~~ SOAJ: 20.0000 mg | SUBCUTANEOUS | 0 refills | Status: DC

## 2018-08-12 NOTE — Patient Instructions (Signed)
Standing Labs We placed an order today for your standing lab work.    Please come back and get your standing labs in May and every 3 months   TB gold, CBC, and CMP    We have open lab Monday through Friday from 8:30-11:30 AM and 1:30-4:00 PM  at the office of Dr. Pollyann Savoy.   You may experience shorter wait times on Monday and Friday afternoons. The office is located at [address], [place], [place] [address] No appointment is necessary.   Labs are drawn by [facility].  You may receive a bill from [place] for your lab work.  If you wish to have your labs drawn at another location, please call the office 24 hours in advance to send orders.  If you have any questions regarding directions or hours of operation,  please call [phone].   Just as a reminder please drink plenty of water prior to coming for your lab work. Thanks!

## 2018-08-12 NOTE — Progress Notes (Signed)
Office Visit Note  Patient: Donna Lopez             Date of Birth: April 15, 1966           MRN: [id number]             PCP: Patient, No Pcp Per Referring: No ref. provider found Visit Date: 08/12/2018 Occupation: @GUAROCC @  Subjective:  Pain in both hands   History of Present Illness: Donna Lopez is a 53 y.o. female with history of seropositive rheumatoid arthritis, osteoarthritis, and DDD. She is on Humira 40 mg sq injections every 2 weeks, MTX 20 mg po once weekly, and folic acid 2 mg po daily.  Her last Humira injection was 1 week ago. She denies missing any doses. She has been under increased stress and has been working at home due to the coronavirus pandemic.  She has not increased her level of activity. She states that she has been having a flare in both hands for the past 2 weeks.  She is having pain and swelling in both hands.  She has intermittent pain in both wrist joints.  She is experiencing left ankle joint pain but denies any joint swelling.  She has chronic pain in both knee joints.  She states her lower back is doing well.  She has not had any recent infections.  She has not had any prednisone recently but has tolerated it in the past.     Activities of Daily Living:  Patient reports morning stiffness for several hours.   Patient Reports nocturnal pain.  Difficulty dressing/grooming: Denies Difficulty climbing stairs: Reports Difficulty getting out of chair: Denies Difficulty using hands for taps, buttons, cutlery, and/or writing: Reports  Review of Systems  Constitutional: Negative for fatigue.  HENT: Negative for mouth sores, mouth dryness and nose dryness.   Eyes: Negative for pain, redness, itching, visual disturbance and dryness.  Respiratory: Negative for cough, hemoptysis, shortness of breath, wheezing and difficulty breathing.   Cardiovascular: Negative for chest pain, palpitations, hypertension and swelling in legs/feet.  Gastrointestinal: Negative for  blood in stool, constipation and diarrhea.  Endocrine: Negative for increased urination.  Genitourinary: Negative for painful urination and pelvic pain.  Musculoskeletal: Positive for arthralgias, joint pain, joint swelling and morning stiffness. Negative for myalgias, muscle weakness, muscle tenderness and myalgias.  Skin: Negative for color change, pallor, rash, hair loss, nodules/bumps, redness, skin tightness, ulcers and sensitivity to sunlight.  Allergic/Immunologic: Negative for susceptible to infections.  Neurological: Negative for dizziness, light-headedness, numbness, headaches, memory loss and weakness.  Hematological: Negative for swollen glands.  Psychiatric/Behavioral: Negative for depressed mood, confusion and sleep disturbance. The patient is not nervous/anxious.     PMFS History:  Patient Active Problem List   Diagnosis Date Noted  . BMI 40.0-44.9, adult (HCC) 07/12/2018  . Primary osteoarthritis of both knees 12/24/2016  . 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 DDD (degenerative disc disease), lumbar 11/17/2016  . Vitamin D deficiency 11/17/2016  . History of rosacea/ History of acne 11/17/2016  . High risk medications (not anticoagulants) long-term use 06/25/2016  . History of degenerative disc disease 06/25/2016  . Severe obesity (BMI >= 40) (HCC) 06/13/2014  . Left-sided thoracic back pain 07/19/2013  . PALPITATIONS 10/17/2009  . UTI 05/24/2009  . Rheumatoid arthritis (HCC) 05/24/2009  . Hair loss 02/22/2009  . VAGINAL BLEEDING 10/05/2008  . Carrier of group B Streptococcus 07/28/2007  . GERD 12/28/2006  . CARDIAC MURMUR, HX OF 12/28/2006  . HIATAL  HERNIA, HX OF 12/28/2006    Past Medical History:  Diagnosis Date  . Allergy   . Arthritis   . GERD (gastroesophageal reflux disease)   . Heart murmur   . Rheumatoid arthritis (HCC)   . 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 Uterine cancer Mother   .  Diabetes Father   . Kidney cancer Father   . Colon cancer Sister 67  . Rectal cancer Sister   . Colon cancer Paternal Grandfather   . Colon polyps Brother   . Cancer Sister        breast   . Healthy Daughter   . Esophageal cancer Neg Hx   . Stomach cancer Neg Hx    Past Surgical History:  Procedure Laterality Date  . COLONOSCOPY  2014   DB- normal   . NO PAST SURGERIES    . UPPER GASTROINTESTINAL ENDOSCOPY     Social History   Social History Narrative   Exercise--- sometimes walks 1x a week   Immunization History  Administered Date(s) Administered  . Influenza Whole 02/08/2009  . Influenza,inj,Quad PF,6+ Mos 03/09/2013, 06/13/2014, 06/16/2016  . Td 09/29/2001  . Tdap 06/13/2014     Objective: Vital Signs: BP (!) 141/95 (BP Location: Left Arm, Patient Position: Sitting, Cuff Size: Normal)   Pulse 93   Resp 14   Ht  (1.6 m)   Wt 226 lb 9.6 oz (102.8 kg)   BMI 40.14 kg/m    Physical Exam Vitals signs and nursing note reviewed.  Constitutional:      Appearance: She is well-developed.  HENT:     Head: Normocephalic and atraumatic.  Eyes:     Conjunctiva/sclera: Conjunctivae normal.  Neck:     Musculoskeletal: Normal range of motion.  Cardiovascular:     Rate and Rhythm: Normal rate and regular rhythm.     Heart sounds: Normal heart sounds.  Pulmonary:     Effort: Pulmonary effort is normal.     Breath sounds: Normal breath sounds.  Abdominal:     General: Bowel sounds are normal.     Palpations: Abdomen is soft.  Lymphadenopathy:     Cervical: No cervical adenopathy.  Skin:    General: Skin is warm and dry.     Capillary Refill: Capillary refill takes less than 2 seconds.  Neurological:     Mental Status: She is alert and oriented to person, place, and time.  Psychiatric:        Behavior: Behavior normal.      Musculoskeletal Exam: C-spine, thoracic spine, and lumbar spine good ROM.  No midline spinal tenderness.  No SI joint tenderness.   Shoulder joints and elbow joints good ROM with no synovitis or tenderness.  She has slightly limited extension of both wrist joints. Left wrist extensor tenosynovitis.  Synovitis of right 2nd and 3rd MCPs and 2nd and 3rd PIP joints.  Left 4th PIP joint synovitis. Hip joints good ROM with no discomfort.  Left knee limited extension and warmth.  Right knee good ROM with no warmth or effusion.  No tenderness or swelling of ankle joints.  No tenderness over trochanteric bursa bilaterally.    CDAI Exam: CDAI Score: 13.4  Patient Global Assessment: 8 (mm); Provider Global Assessment: 6 (mm) Swollen: 6 ; Tender: 6  Joint Exam      Right  Left  Wrist     Swollen Tender  MCP 2  Swollen Tender  MCP 3  Swollen Tender     PIP 2  Swollen Tender     PIP 3  Swollen Tender     PIP 4     Swollen Tender     Investigation: No additional findings.  Imaging: No results found.  Recent Labs: Lab Results  Component Value Date   WBC 9.1 07/12/2018   HGB 12.8 07/12/2018   PLT 299 07/12/2018   NA 138 07/12/2018   K 4.3 07/12/2018   CL 103 07/12/2018   CO2 26 07/12/2018   GLUCOSE 86 07/12/2018   BUN 19 07/12/2018   CREATININE 0.62 07/12/2018   BILITOT 0.3 07/12/2018   ALKPHOS 77 08/10/2017   AST 17 07/12/2018   ALT 16 07/12/2018   PROT 7.0 07/12/2018   ALBUMIN 3.8 08/10/2017   CALCIUM 8.9 07/12/2018   GFRAA 119 07/12/2018   QFTBGOLDPLUS NEGATIVE 10/22/2017    Speciality Comments: No specialty comments available.  Procedures:  No procedures performed Allergies: Penicillins   Assessment / Plan:     Visit Diagnoses: Rheumatoid arthritis involving multiple sites with positive rheumatoid factor (HCC) - positive rheumatoid factor, positive anti-CCP, erosive disease: She has active synovitis and tenderness of the right 2nd and 3rd MCPs and right 2nd and 3rd PIP joints.  She has left 4th PIP joint synovitis and left extensor tenosynovitis. She has been flaring for the past 2 weeks. She is on  Humira 40 mg sq injections every 14 days, MTX 20 mg po once weekly, and folic acid 2 mg po daily.  She has not missed any doses recently.  Her last dose of Humira was 1 week ago and she did not notice any improvement in her symptoms.  She has been under increased stress working from home due to the coronavirus.  We discussed switching her from tablets to injectable MTX due increased efficacy with the sq formulation.  A prescription for Rasuvo 20 mg sq injections will be sent to the pharmacy.  We will also send in a refill of the new formulation of humira as well. She will be started on a prednisone taper starting at 20 mg and tapering by 5 mg every 4 days.  She was advised to notify us if she continues to have increased joint pain and joint swelling after completing the prednisone taper.  If she continues to flare we will discuss other treatment options. She will follow up in 3 months.   High risk medications (not anticoagulants) long-term use - Humira 40mg /mL subq every 14 days, Methotrexate 20 mg po weekly, and folic acid 2 mg po qd.  Standing orders for CBC and CMP were placed today.  Future order for TB gold placed.  She has not had any recent infections.- Plan: QuantiFERON-TB Gold Plus, CBC with Differential/Platelet, COMPLETE METABOLIC PANEL WITH GFR  Primary osteoarthritis of both knees: Left knee has limited extension and warmth.  Right knee has good ROM with no warmth or effusion.  She has chronic pain in both knee joints.  She has difficulty climbing steps.   DDD (degenerative disc disease), thoracic: Postural kyphosis noted.  No midline spinal tenderness.    DDD (degenerative disc disease), lumbar: She has good ROM with no discomfort.  No midline spinal tenderness.  She has no pain at this time.   Other medical conditions are listed as follows:   History of rosacea/ History of acne  History of gastroesophageal reflux (GERD)  History of hiatal hernia  History of obesity  History of  vitamin  D deficiency   Orders: Orders Placed This Encounter  Procedures  . QuantiFERON-TB Gold Plus  . CBC with Differential/Platelet  . COMPLETE METABOLIC PANEL WITH GFR   Meds ordered this encounter  Medications  . predniSONE (DELTASONE) 5 MG tablet    Sig: Take 4 tablets by mouth daily x4 days, 3 tablets by mouth daily x4 days, 2 tablets by mouth daily x4 days, 1 tablet by mouth daily x4 days.    Dispense:  40 tablet    Refill:  0    Face-to-face time spent with patient was 30 minutes. Greater than 50% of time was spent in counseling and coordination of care.  Follow-Up Instructions: Return in about 3 months (around 11/12/2018) for Rheumatoid arthritis, Osteoarthritis.   Gearldine Bienenstock, PA-C   I examined and evaluated the patient with Donna Ales PA.  Patient continues to have pain and swelling in multiple joints on my exam today.  She is having a flare today a prednisone taper was given.  If she has persistent flare after the prednisone taper is over we may have to reconsider therapy.  The plan of care was discussed as noted above.  Pollyann Savoy, MD  Note - This record has been created using Animal nutritionist.  Chart creation errors have been sought, but may not always  have been located. Such creation errors do not reflect on  the standard of medical care.

## 2018-08-18 DIAGNOSIS — F4320 Adjustment disorder, unspecified: Secondary | ICD-10-CM | POA: Diagnosis not present

## 2018-08-24 ENCOUNTER — Ambulatory Visit: Payer: BLUE CROSS/BLUE SHIELD | Admitting: Rheumatology

## 2018-09-01 DIAGNOSIS — F4320 Adjustment disorder, unspecified: Secondary | ICD-10-CM | POA: Diagnosis not present

## 2018-09-15 DIAGNOSIS — F4320 Adjustment disorder, unspecified: Secondary | ICD-10-CM | POA: Diagnosis not present

## 2018-09-30 DIAGNOSIS — F4320 Adjustment disorder, unspecified: Secondary | ICD-10-CM | POA: Diagnosis not present

## 2018-10-12 ENCOUNTER — Other Ambulatory Visit: Payer: Self-pay | Admitting: Rheumatology

## 2018-10-12 ENCOUNTER — Encounter: Payer: Self-pay | Admitting: Rheumatology

## 2018-10-12 NOTE — Telephone Encounter (Signed)
Last Visit: 08/12/18 Next Visit: 11/11/18  Okay to refill per Dr. Corliss Skains

## 2018-10-18 DIAGNOSIS — G5600 Carpal tunnel syndrome, unspecified upper limb: Secondary | ICD-10-CM

## 2018-10-18 HISTORY — DX: Carpal tunnel syndrome, unspecified upper limb: G56.00

## 2018-10-21 DIAGNOSIS — F4320 Adjustment disorder, unspecified: Secondary | ICD-10-CM | POA: Diagnosis not present

## 2018-10-26 ENCOUNTER — Encounter: Payer: Self-pay | Admitting: Family Medicine

## 2018-10-26 ENCOUNTER — Encounter: Payer: Self-pay | Admitting: Rheumatology

## 2018-10-26 DIAGNOSIS — R202 Paresthesia of skin: Secondary | ICD-10-CM

## 2018-10-26 NOTE — Telephone Encounter (Signed)
I can not put referral in because we have not seen her in over a year If she can make her own app --- [facility]--- dr Maureen Ralphs

## 2018-10-26 NOTE — Telephone Encounter (Signed)
Referral placed 10/26/2018 for Dr. Ernestina Patches.

## 2018-10-26 NOTE — Telephone Encounter (Signed)
Responded via Mychart.

## 2018-10-27 ENCOUNTER — Other Ambulatory Visit: Payer: Self-pay | Admitting: Physician Assistant

## 2018-10-27 NOTE — Telephone Encounter (Signed)
Last Visit: 08/12/18 Next Visit: 11/11/18 Labs: 07/12/18 CMP WNL. CBC stable. TB Gold: 11/11/17 Neg  Left message to advise patient she is due for labs.   Okay to refill 30 day supply Humira?

## 2018-10-27 NOTE — Telephone Encounter (Signed)
Spoke with patient and she will get labs this week. Patient advised referral for nerve conduction study has now been placed and that they should be contacting her to schedule an appointment.

## 2018-10-27 NOTE — Telephone Encounter (Signed)
Ok. Pl advise pt to get labs for further refills.

## 2018-10-28 ENCOUNTER — Other Ambulatory Visit: Payer: Self-pay | Admitting: Pharmacist

## 2018-10-28 DIAGNOSIS — Z79899 Other long term (current) drug therapy: Secondary | ICD-10-CM

## 2018-10-28 DIAGNOSIS — Z9225 Personal history of immunosupression therapy: Secondary | ICD-10-CM | POA: Diagnosis not present

## 2018-10-29 NOTE — Progress Notes (Signed)
Glucose is elevated-126.  Rest of CMP WNL. CBC WNL

## 2018-10-30 LAB — CBC WITH DIFFERENTIAL/PLATELET
Absolute Monocytes: 730 cells/uL (ref 200–950)
Basophils Absolute: 58 cells/uL (ref 0–200)
Basophils Relative: 0.6 %
Eosinophils Absolute: 202 cells/uL (ref 15–500)
Eosinophils Relative: 2.1 %
HCT: 40.6 % (ref 35.0–45.0)
Hemoglobin: 13.8 g/dL (ref 11.7–15.5)
Lymphs Abs: 2669 cells/uL (ref 850–3900)
MCH: 30.3 pg (ref 27.0–33.0)
MCHC: 34 g/dL (ref 32.0–36.0)
MCV: 89 fL (ref 80.0–100.0)
MPV: 10.3 fL (ref 7.5–12.5)
Monocytes Relative: 7.6 %
Neutro Abs: 5942 cells/uL (ref 1500–7800)
Neutrophils Relative %: 61.9 %
Platelets: 293 10*3/uL (ref 140–400)
RBC: 4.56 10*6/uL (ref 3.80–5.10)
RDW: 15.7 % — ABNORMAL HIGH (ref 11.0–15.0)
Total Lymphocyte: 27.8 %
WBC: 9.6 10*3/uL (ref 3.8–10.8)

## 2018-10-30 LAB — COMPLETE METABOLIC PANEL WITH GFR
AG Ratio: 1.2 (calc) (ref 1.0–2.5)
ALT: 15 U/L (ref 6–29)
AST: 18 U/L (ref 10–35)
Albumin: 3.8 g/dL (ref 3.6–5.1)
Alkaline phosphatase (APISO): 75 U/L (ref 37–153)
BUN: 19 mg/dL (ref 7–25)
CO2: 23 mmol/L (ref 20–32)
Calcium: 9 mg/dL (ref 8.6–10.4)
Chloride: 105 mmol/L (ref 98–110)
Creat: 0.55 mg/dL (ref 0.50–1.05)
GFR, Est African American: 124 mL/min/{1.73_m2} (ref >=60)
GFR, Est Non African American: 107 mL/min/{1.73_m2} (ref >=60)
Globulin: 3.2 g/dL (calc) (ref 1.9–3.7)
Glucose, Bld: 126 mg/dL — ABNORMAL HIGH (ref 65–99)
Potassium: 4.2 mmol/L (ref 3.5–5.3)
Sodium: 139 mmol/L (ref 135–146)
Total Bilirubin: 0.3 mg/dL (ref 0.2–1.2)
Total Protein: 7 g/dL (ref 6.1–8.1)

## 2018-10-30 LAB — QUANTIFERON-TB GOLD PLUS
Mitogen-NIL: >10 IU/mL
NIL: 0.01 IU/mL
QuantiFERON-TB Gold Plus: NEGATIVE
TB1-NIL: 0 IU/mL
TB2-NIL: 0 IU/mL

## 2018-10-31 ENCOUNTER — Other Ambulatory Visit: Payer: Self-pay | Admitting: Physician Assistant

## 2018-11-01 ENCOUNTER — Other Ambulatory Visit: Payer: Self-pay | Admitting: Family Medicine

## 2018-11-01 DIAGNOSIS — Z1231 Encounter for screening mammogram for malignant neoplasm of breast: Secondary | ICD-10-CM

## 2018-11-01 NOTE — Progress Notes (Signed)
TB gold negative

## 2018-11-02 NOTE — Progress Notes (Deleted)
Office Visit Note  Patient: Donna Lopez             Date of Birth: 05-22-1965           MRN: [id number]             PCP: Donato Schultz, DO Referring: No ref. provider found Visit Date: 11/11/2018 Occupation: @GUAROCC @  Subjective:  No chief complaint on file.  Humira 40 mg every 14 days, receive the 20 mg every 7 days, and folic acid 1 mg 2 tablets daily.  Last TB gold negative on 10/28/2018 and will monitor yearly.  Most recent CBC/CMP within normal limits on 10/28/2018.  Due for CBC/CMP in September and will monitor every 3 months.  Standing orders are in place. Recommend annual influenza, Pneumovax 23, Prevnar 13, and Shingrix as indicated for immunosuppressant therapy.   Recommend yearly skin exams due to increased risk of melanoma.  History of Present Illness: Donna Lopez is a 53 y.o. female ***   Activities of Daily Living:  Patient reports morning stiffness for *** {minute/hour:19697}.   Patient {ACTIONS;DENIES/REPORTS:21021675::"Denies"} nocturnal pain.  Difficulty dressing/grooming: {ACTIONS;DENIES/REPORTS:21021675::"Denies"} Difficulty climbing stairs: {ACTIONS;DENIES/REPORTS:21021675::"Denies"} Difficulty getting out of chair: {ACTIONS;DENIES/REPORTS:21021675::"Denies"} Difficulty using hands for taps, buttons, cutlery, and/or writing: {ACTIONS;DENIES/REPORTS:21021675::"Denies"}  No Rheumatology ROS completed.   PMFS History:  Patient Active Problem List   Diagnosis Date Noted  . BMI 40.0-44.9, adult (HCC) 07/12/2018  . Primary osteoarthritis of both knees 12/24/2016  . 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 DDD (degenerative disc disease), lumbar 11/17/2016  . Vitamin D deficiency 11/17/2016  . History of rosacea/ History of acne 11/17/2016  . High risk medications (not anticoagulants) long-term use 06/25/2016  . History of degenerative disc disease 06/25/2016  . Severe obesity (BMI >= 40) (HCC) 06/13/2014  . Left-sided thoracic back  pain 07/19/2013  . PALPITATIONS 10/17/2009  . UTI 05/24/2009  . Rheumatoid arthritis (HCC) 05/24/2009  . Hair loss 02/22/2009  . VAGINAL BLEEDING 10/05/2008  . Carrier of group B Streptococcus 07/28/2007  . GERD 12/28/2006  . CARDIAC MURMUR, HX OF 12/28/2006  . HIATAL HERNIA, HX OF 12/28/2006    Past Medical History:  Diagnosis Date  . Allergy   . Arthritis   . GERD (gastroesophageal reflux disease)   . Heart murmur   . Rheumatoid arthritis (HCC)   . 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 Uterine cancer Mother   . Diabetes Father   . Kidney cancer Father   . Colon cancer Sister 81  . Rectal cancer Sister   . Colon cancer Paternal Grandfather   . Colon polyps Brother   . Cancer Sister        breast   . Healthy Daughter   . Esophageal cancer Neg Hx   . Stomach cancer Neg Hx    Past Surgical History:  Procedure Laterality Date  . COLONOSCOPY  2014   DB- normal   . NO PAST SURGERIES    . UPPER GASTROINTESTINAL ENDOSCOPY     Social History   Social History Narrative   Exercise--- sometimes walks 1x a week   Immunization History  Administered Date(s) Administered  . Influenza Whole 02/08/2009  . Influenza,inj,Quad PF,6+ Mos 03/09/2013, 06/13/2014, 06/16/2016  . Td 09/29/2001  . Tdap 06/13/2014     Objective: Vital Signs: There were no vitals taken for this visit.   Physical Exam   Musculoskeletal Exam: ***  CDAI Exam: CDAI Score: -  Patient Global: -; Provider Global: - Swollen: -; Tender: - Joint Exam   No joint exam has been documented for this visit   There is currently no information documented on the homunculus. Go to the Rheumatology activity and complete the homunculus joint exam.  Investigation: No additional findings.  Imaging: No results found.  Recent Labs: Lab Results  Component Value Date   WBC 9.6 10/28/2018   HGB 13.8 10/28/2018   PLT 293 10/28/2018   NA 139 10/28/2018   K 4.2 10/28/2018    CL 105 10/28/2018   CO2 23 10/28/2018   GLUCOSE 126 (H) 10/28/2018   BUN 19 10/28/2018   CREATININE 0.55 10/28/2018   BILITOT 0.3 10/28/2018   ALKPHOS 77 08/10/2017   AST 18 10/28/2018   ALT 15 10/28/2018   PROT 7.0 10/28/2018   ALBUMIN 3.8 08/10/2017   CALCIUM 9.0 10/28/2018   GFRAA 124 10/28/2018   QFTBGOLDPLUS NEGATIVE 10/28/2018    Speciality Comments: No specialty comments available.  Procedures:  No procedures performed Allergies: Penicillins   Assessment / Plan:     Visit Diagnoses: No diagnosis found.   Orders: No orders of the defined types were placed in this encounter.  No orders of the defined types were placed in this encounter.   Face-to-face time spent with patient was *** minutes. Greater than 50% of time was spent in counseling and coordination of care.  Follow-Up Instructions: No follow-ups on file.   Earnestine Mealing, CMA  Note - This record has been created using Editor, commissioning.  Chart creation errors have been sought, but may not always  have been located. Such creation errors do not reflect on  the standard of medical care.

## 2018-11-10 DIAGNOSIS — F4320 Adjustment disorder, unspecified: Secondary | ICD-10-CM | POA: Diagnosis not present

## 2018-11-11 ENCOUNTER — Ambulatory Visit: Payer: Self-pay | Admitting: Rheumatology

## 2018-11-11 NOTE — Progress Notes (Signed)
Office Visit Note  Patient: Donna Lopez             Date of Birth: [birth year]           MRN: [id number]             PCP: Donato Schultz, DO Referring: Donato Schultz, * Visit Date: 11/15/2018 Occupation: @GUAROCC @  Subjective:  Left knee joint pain   History of Present Illness: Donna Lopez is a 53 y.o. female with history of seropositive rheumatoid arthritis, osteoarthritis, and DDD.  She is on Humira 40 mg sq injections every 14 days, Rasuvo 20 mg sq weekly injections, and folic acid 2 mg po daily.  She has noticed improvement since switching from the tablets of methotrexate to the injectable form.  She has not missed any doses recently.  She noticed significant improvement after the prednisone taper she took in March.  She denies any other recent flares.  She continues to have pain in bilateral hands.  She has intermittent joint swelling in her right second PIP joint.  She continues to have numbness in the left first, second, and third digits.  She has a nerve conduction study scheduled tomorrow with Dr. April.  She has chronic pain in her left knee joint and notices swelling and warmth.  She states that she would like to have a knee replacement this summer but is unable to get it scheduled at this time.  She reports that she was laid off at the beginning of May due to the COVID-19 pandemic.  She states that she will be losing her insurance in August 2020.  She would like to stay on Rasuvo if she can but she is unsure of her husband's insurance will cover it.   Activities of Daily Living:  Patient reports morning stiffness for 30  minutes.   Patient Reports nocturnal pain.  Difficulty dressing/grooming: Denies Difficulty climbing stairs: Reports Difficulty getting out of chair: Reports Difficulty using hands for taps, buttons, cutlery, and/or writing: Reports  Review of Systems  Constitutional: Negative for fatigue.  HENT: Negative for mouth sores, mouth dryness  and nose dryness.   Eyes: Negative for pain, visual disturbance and dryness.  Respiratory: Negative for cough, hemoptysis, shortness of breath and difficulty breathing.   Cardiovascular: Negative for chest pain, palpitations, hypertension and swelling in legs/feet.  Gastrointestinal: Negative for blood in stool, constipation and diarrhea.  Endocrine: Negative for increased urination.  Genitourinary: Negative for painful urination.  Musculoskeletal: Positive for arthralgias, joint pain and morning stiffness. Negative for joint swelling, myalgias, muscle weakness, muscle tenderness and myalgias.  Skin: Negative for color change, pallor, rash, hair loss, nodules/bumps, skin tightness, ulcers and sensitivity to sunlight.  Allergic/Immunologic: Negative for susceptible to infections.  Neurological: Negative for dizziness, numbness, headaches and weakness.  Hematological: Negative for swollen glands.  Psychiatric/Behavioral: Positive for depressed mood. Negative for sleep disturbance. The patient is nervous/anxious.     PMFS History:  Patient Active Problem List   Diagnosis Date Noted   BMI 40.0-44.9, adult (HCC) 07/12/2018   Primary osteoarthritis of both knees 12/24/2016   DDD (degenerative disc disease), thoracic 11/17/2016   DDD (degenerative disc disease), lumbar 11/17/2016   Vitamin D deficiency 11/17/2016   History of rosacea/ History of acne 11/17/2016   High risk medications (not anticoagulants) long-term use 06/25/2016   History of degenerative disc disease 06/25/2016   Severe obesity (BMI >= 40) (HCC) 06/13/2014   Left-sided thoracic back pain 07/19/2013  PALPITATIONS 10/17/2009   UTI 05/24/2009   Rheumatoid arthritis ([place]) 05/24/2009   Hair loss 02/22/2009   VAGINAL BLEEDING 10/05/2008   Carrier of group B Streptococcus 07/28/2007   GERD 12/28/2006   CARDIAC MURMUR, HX OF 12/28/2006   HIATAL HERNIA, HX OF 12/28/2006    Past Medical History:  Diagnosis  Date   Allergy    Arthritis    GERD (gastroesophageal reflux disease)    Heart murmur    Rheumatoid arthritis ([place])    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     Family History  Problem Relation Age of Onset   Uterine cancer Mother    Diabetes Father    Kidney cancer Father    Colon cancer Sister 53   Rectal cancer Sister    Colon cancer Paternal Grandfather    Colon polyps Brother    Cancer Sister        breast    Healthy Daughter    Esophageal cancer Neg Hx    Stomach cancer Neg Hx    Past Surgical History:  Procedure Laterality Date   COLONOSCOPY  2014   DB- normal    NO PAST SURGERIES     UPPER GASTROINTESTINAL ENDOSCOPY     Social History   Social History Narrative   Exercise--- sometimes walks 1x a week   Immunization History  Administered Date(s) Administered   Influenza Whole 02/08/2009   Influenza,inj,Quad PF,6+ Mos 03/09/2013, 06/13/2014, 06/16/2016   Td 09/29/2001   Tdap 06/13/2014     Objective: Vital Signs: BP (!) 138/94 (BP Location: Left Wrist, Patient Position: Sitting, Cuff Size: Normal)    Pulse 91    Resp 14    Ht 5' 2.5" (1.588 m)    Wt 223 lb 12.8 oz (101.5 kg)    BMI 40.28 kg/m    Physical Exam Vitals signs and nursing note reviewed.  Constitutional:      Appearance: She is well-developed.  HENT:     Head: Normocephalic and atraumatic.  Eyes:     Conjunctiva/sclera: Conjunctivae normal.  Neck:     Musculoskeletal: Normal range of motion.  Cardiovascular:     Rate and Rhythm: Normal rate and regular rhythm.     Heart sounds: Normal heart sounds.  Pulmonary:     Effort: Pulmonary effort is normal.     Breath sounds: Normal breath sounds.  Abdominal:     General: Bowel sounds are normal.     Palpations: Abdomen is soft.  Lymphadenopathy:     Cervical: No cervical adenopathy.  Skin:    General: Skin is warm and dry.     Capillary Refill: Capillary refill takes less than 2 seconds.  Neurological:     Mental Status: She is  alert and oriented to person, place, and time.  Psychiatric:        Behavior: Behavior normal.      Musculoskeletal Exam: C-spine, thoracic spine, and lumbar spine good ROM.  No midline spinal tenderness.  No SI joint tenderness.  Shoulder joints, elbow joints, MCPs, PIPs, and DIPs good ROM with no synovitis. Limited ROM of both wrist joints. Tenderness of the right 2nd PIP joint.  Mucin cyst on the right 2nd DIP joint.  Hip joints good ROM with no discomfort.  Left knee has limited extension and mild valgus deformity.  Warmth of left knee joint noted.  Right knee has good ROM with no warmth or effusion.  No tenderness or swelling of ankle joints.   CDAI Exam: CDAI Score: 1.2  Patient  Global: 6 mm; Provider Global: 6 mm Swollen: 0 ; Tender: 0  Joint Exam   No joint exam has been documented for this visit   There is currently no information documented on the homunculus. Go to the Rheumatology activity and complete the homunculus joint exam.  Investigation: No additional findings.  Imaging: No results found.  Recent Labs: Lab Results  Component Value Date   WBC 9.6 10/28/2018   HGB 13.8 10/28/2018   PLT 293 10/28/2018   NA 139 10/28/2018   K 4.2 10/28/2018   CL 105 10/28/2018   CO2 23 10/28/2018   GLUCOSE 126 (H) 10/28/2018   BUN 19 10/28/2018   CREATININE 0.55 10/28/2018   BILITOT 0.3 10/28/2018   ALKPHOS 77 08/10/2017   AST 18 10/28/2018   ALT 15 10/28/2018   PROT 7.0 10/28/2018   ALBUMIN 3.8 08/10/2017   CALCIUM 9.0 10/28/2018   GFRAA 124 10/28/2018   QFTBGOLDPLUS NEGATIVE 10/28/2018    Speciality Comments: No specialty comments available.  Procedures:  No procedures performed Allergies: Penicillins   Assessment / Plan:     Visit Diagnoses: Rheumatoid arthritis involving multiple sites with positive rheumatoid factor (HCC) - Positive rheumatoid factor, positive anti-CCP, erosive disease: She has no synovitis on exam.  She has not any recent rheumatoid arthritis  flares.  She continues to have chronic pain in bilateral hands but no inflammation was noted.  She has PIP and DIP synovial thickening consistent with osteoarthritis of bilateral hands.  She is clinically doing well on Humira 40 mg subcutaneous injections every 14 days, Rasuvo 20 mg subcutaneous injections every 7 days, and folic acid 2 mg by mouth daily.  At her last visit on 08/12/18, she was switched from tablets to injectable form of methotrexate and has noted significant benefit. She had a flare in March, which resolved after a prednisone taper.  She would like to continue ont his current treatment regimen.  She was provided for samples of Evaristo Buryresiba today in the office.  She will be losing her insurance in August 2020 and is unsure if her husband's insurance will cover Rasuvo.  She will contact her office closer to time.  She was advised to notify us if she has increased joint pain or joint swelling.  She will follow-up in the office in 5 months.  High risk medications (not anticoagulants) long-term use -  Humira 40 mg every 14 days, Rasuvo 20 mg every 7 days (switched from tablets to injectable at last visit), and folic acid 1 mg 2 tablets daily.  Last TB gold negative on 10/28/2018 and will monitor yearly.  Most recent CBC/CMP within normal limits on 10/28/2018.  Due for CBC/CMP in September and will monitor every 3 months.  Standing orders are in place.  She was advised to hold Humira and Rasuvo if she develops signs or symptoms of an infection and to resume once infection is completely cleared.  We discussed importance of social distancing and following the standard precautions recommended by the CDC.  She was advised to notify us when her left knee replacement is scheduled so we can advise her when to hold Humira and Rasuvo prior to surgery. Patient was laid off in May 2020 due to the COVID-19 pandemic.  She will be losing her insurance in August 2020.  She would like to stay on Rasuvo if possible but is  unsure of her husband's insurance will cover Rasuvo.  She was provided four samples of Rasuvo today in the office.  Primary  osteoarthritis of both knees - Right knee has good ROM with no warmth or effusion.  Left knee has limited extension and valgus deformity.  Warmth of the left knee joint noted.  She would like to proceed with a left knee replacement.  Recommendations of local orthopedists were recommended.  She was advised to notify us when she schedules her surgery, so we can advise her when to hold Humira and Rasuvo.   DDD (degenerative disc disease), thoracic - No midline spinal tenderness.   DDD (degenerative disc disease), lumbar - She has good ROM with no discomfort.   History of vitamin D deficiency -She is taking a calcium and vitamin D supplement.   Other medical conditions are listed as follows:   History of rosacea/ History of acne  History of gastroesophageal reflux (GERD)   History of hiatal hernia    Orders: No orders of the defined types were placed in this encounter.  No orders of the defined types were placed in this encounter.     Follow-Up Instructions: Return in about 5 months (around 04/17/2019) for Rheumatoid arthritis, Osteoarthritis.   Gearldine Bienenstock, PA-C   I examined and evaluated the patient with Sherron Ales PA.  Patient had no synovitis on my examination.  She is doing quite well on combination of Humira and methotrexate.  The plan of care was discussed as noted above.  Pollyann Savoy, MD  Note - This record has been created using Animal nutritionist.  Chart creation errors have been sought, but may not always  have been located. Such creation errors do not reflect on  the standard of medical care.

## 2018-11-15 ENCOUNTER — Ambulatory Visit: Payer: BC Managed Care – PPO | Admitting: Rheumatology

## 2018-11-15 ENCOUNTER — Other Ambulatory Visit: Payer: Self-pay

## 2018-11-15 ENCOUNTER — Encounter: Payer: Self-pay | Admitting: Rheumatology

## 2018-11-15 VITALS — BP 138/94 | HR 91 | Resp 14 | Ht 62.5 in | Wt 223.8 lb

## 2018-11-15 DIAGNOSIS — Z8719 Personal history of other diseases of the digestive system: Secondary | ICD-10-CM

## 2018-11-15 DIAGNOSIS — M5134 Other intervertebral disc degeneration, thoracic region: Secondary | ICD-10-CM

## 2018-11-15 DIAGNOSIS — M170 Bilateral primary osteoarthritis of knee: Secondary | ICD-10-CM

## 2018-11-15 DIAGNOSIS — M0579 Rheumatoid arthritis with rheumatoid factor of multiple sites without organ or systems involvement: Secondary | ICD-10-CM | POA: Diagnosis not present

## 2018-11-15 DIAGNOSIS — M5136 Other intervertebral disc degeneration, lumbar region: Secondary | ICD-10-CM

## 2018-11-15 DIAGNOSIS — Z872 Personal history of diseases of the skin and subcutaneous tissue: Secondary | ICD-10-CM

## 2018-11-15 DIAGNOSIS — M51369 Other intervertebral disc degeneration, lumbar region without mention of lumbar back pain or lower extremity pain: Secondary | ICD-10-CM

## 2018-11-15 DIAGNOSIS — Z8639 Personal history of other endocrine, nutritional and metabolic disease: Secondary | ICD-10-CM

## 2018-11-15 DIAGNOSIS — Z79899 Other long term (current) drug therapy: Secondary | ICD-10-CM | POA: Diagnosis not present

## 2018-11-15 NOTE — Progress Notes (Signed)
Medication Samples have been provided to the patient.  Drug name: Rasuvo   Strength: 20mg         Qty: 4 pens LOT: [id number], [id number]  Exp.Date: 06/2019, 04/2019  Dosing instructions: Inject 20mg  into the skin once weekly.   The patient has been instructed regarding the correct time, dose, and frequency of taking this medication, including desired effects and most common side effects.   Earnestine Mealing 10:35 AM 11/15/2018

## 2018-11-15 NOTE — Patient Instructions (Signed)
Standing Labs We placed an order today for your standing lab work.    Please come back and get your standing labs in September and every 3 months  We have open lab daily Monday through Thursday from 8:30-12:30 PM and 1:30-4:30 PM and Friday from 8:30-12:30 PM and 1:30 -4:00 PM at the office of Dr. Shaili Deveshwar.   You may experience shorter wait times on Monday and Friday afternoons. The office is located at [address], [address], [place], [place] [address] No appointment is necessary.   Labs are drawn by [facility].  You may receive a bill from [facility] for your lab work.  If you wish to have your labs drawn at another location, please call the office 24 hours in advance to send orders.  If you have any questions regarding directions or hours of operation,  please call [phone].   Just as a reminder please drink plenty of water prior to coming for your lab work. Thanks!  

## 2018-11-16 ENCOUNTER — Telehealth: Payer: Self-pay | Admitting: *Deleted

## 2018-11-16 ENCOUNTER — Ambulatory Visit (INDEPENDENT_AMBULATORY_CARE_PROVIDER_SITE_OTHER): Payer: BC Managed Care – PPO | Admitting: Physical Medicine and Rehabilitation

## 2018-11-16 ENCOUNTER — Encounter: Payer: Self-pay | Admitting: Physical Medicine and Rehabilitation

## 2018-11-16 DIAGNOSIS — R202 Paresthesia of skin: Secondary | ICD-10-CM | POA: Diagnosis not present

## 2018-11-16 NOTE — Telephone Encounter (Signed)
Patient advised of results. Patient states she is due to see Dr. Rush Farmer for her knee tomorrow and will discuss the CTS surgery with him at that appointment.

## 2018-11-16 NOTE — Progress Notes (Signed)
Donna Lopez - 53 y.o. female MRN [id number]  Date of birth: September 11, 1965  Office Visit Note: Visit Date: 11/16/2018 PCP: Donato Schultz, DO Referred by: Donato Schultz, *  Subjective: Chief Complaint  Patient presents with  . Left Hand - Tingling   HPI: Donna Lopez is a 53 y.o. female who comes in today At the request of Dr. Nilsa Nutting for upper extremity electrodiagnostic study.  Patient is right-hand dominant but no right hand symptoms at all except for some pain but no numbness or tingling on the right.  She reports several month history of chronic worsening now tingling pain with numbness and tingling particularly in the thumb index and middle finger on the left.  She reports that she does get nocturnal worsening particular she sleeps on the left side.  She is tried different sleeping positions without any relief.  She is tried some bracing with an older brace that she had but it did not seem to help although she did not try it very long.  She does not really endorse pain is much is the numbness and tingling at this point.  She does find it difficult with small objects and she has dropped some things at times.  Her course is complicated by rheumatoid arthritis.  She has had no prior electrodiagnostic studies.  She denies any frank radicular pain or specific trauma.  ROS Otherwise per HPI.  Assessment & Plan: Visit Diagnoses:  1. Paresthesia of skin     Plan: Impression: The above electrodiagnostic study is ABNORMAL and reveals evidence of a severe left median nerve entrapment at the **wrist (carpal tunnel syndrome) affecting sensory and motor components. **There appears to be some level of conduction block when looking at the sensory nerve action potential measured with stimulation above the wrist versus in the palm.  This could portend a better clinical outcome with decompression.  However, when measuring the motor study there is some indication this may be more  of a proximal lesion instead of just at the wrist. Clinical exam had good sensation to palm side of the thumb which indicated intact superficial branch.Ultrasound evaluation may be beneficial along the root and course of the median nerve.  There is no significant electrodiagnostic evidence of any other focal nerve entrapment, brachial plexopathy or cervical radiculopathy.   Recommendations: 1.  Follow-up with referring physician. 2.  Continue current management of symptoms. 3.  Suggest surgical evaluation.   Meds & Orders: No orders of the defined types were placed in this encounter.   Orders Placed This Encounter  Procedures  . NCV with EMG (electromyography)    Follow-up: Return for Melvenia Needles, MD.   Procedures: No procedures performed  EMG & NCV Findings: Evaluation of the left median motor nerve showed prolonged distal onset latency (7.7 ms) and reduced amplitude (0.6 mV).  The left median (across palm) sensory nerve showed no response (Wrist).  All remaining nerves (as indicated in the following tables) were within normal limits.    Needle evaluation of the left abductor pollicis brevis muscle showed increased insertional activity and moderately increased spontaneous activity.  All remaining muscles (as indicated in the following table) showed no evidence of electrical instability.    Impression: The above electrodiagnostic study is ABNORMAL and reveals evidence of a severe left median nerve entrapment at the **wrist (carpal tunnel syndrome) affecting sensory and motor components. **There appears to be some level of conduction block when looking at the sensory nerve action  potential measured with stimulation above the wrist versus in the palm.  This could portend a better clinical outcome with decompression.  However, when measuring the motor study there is some indication this may be more of a proximal lesion instead of just at the wrist.  Ultrasound evaluation may be beneficial  along the root and course of the median nerve.  There is no significant electrodiagnostic evidence of any other focal nerve entrapment, brachial plexopathy or cervical radiculopathy.   Recommendations: 1.  Follow-up with referring physician. 2.  Continue current management of symptoms. 3.  Suggest surgical evaluation.  ___________________________ Laurence Spates FAAPMR Board Certified, American Board of Physical Medicine and Rehabilitation    Nerve Conduction Studies Anti Sensory Summary Table   Stim Site NR Peak (ms) Norm Peak (ms) P-T Amp (V) Norm P-T Amp Site1 Site2 Delta-P (ms) Dist (cm) Vel (m/s) Norm Vel (m/s)  Left Median Acr Palm Anti Sensory (2nd Digit)  32.4C  Wrist *NR  <3.6  >10 Wrist Palm  0.0    Palm    1.7 <2.0 37.5         Left Radial Anti Sensory (Base 1st Digit)  32.2C  Wrist    2.0 <3.1 29.7  Wrist Base 1st Digit 2.0 0.0    Left Ulnar Anti Sensory (5th Digit)  32.5C  Wrist    3.2 <3.7 26.0 >15.0 Wrist 5th Digit 3.2 14.0 44 >38   Motor Summary Table   Stim Site NR Onset (ms) Norm Onset (ms) O-P Amp (mV) Norm O-P Amp Site1 Site2 Delta-0 (ms) Dist (cm) Vel (m/s) Norm Vel (m/s)  Left Median Motor (Abd Poll Brev)  32.2C  Wrist    *7.7 <4.2 *0.6 >5 Elbow Wrist 2.8 18.0 64 >50  Elbow    10.5  0.3         Left Ulnar Motor (Abd Dig Min)  32.3C  Wrist    2.7 <4.2 5.1 >3 B Elbow Wrist 2.8 17.0 61 >53  B Elbow    5.5  4.9  A Elbow B Elbow 1.4 10.0 71 >53  A Elbow    6.9  2.7          EMG   Side Muscle Nerve Root Ins Act Fibs Psw Amp Dur Poly Recrt Int Fraser Din Comment  Left Abd Poll Brev Median C8-T1 *Incr *2+ *2+ Nml Nml 0 Nml Nml   Left 1stDorInt Ulnar C8-T1 Nml Nml Nml Nml Nml 0 Nml Nml   Left PronatorTeres Median C6-7 Nml Nml Nml Nml Nml 0 Nml Nml   Left Biceps Musculocut C5-6 Nml Nml Nml Nml Nml 0 Nml Nml   Left Deltoid Axillary C5-6 Nml Nml Nml Nml Nml 0 Nml Nml     Nerve Conduction Studies Anti Sensory Left/Right Comparison   Stim Site L Lat (ms) R Lat  (ms) L-R Lat (ms) L Amp (V) R Amp (V) L-R Amp (%) Site1 Site2 L Vel (m/s) R Vel (m/s) L-R Vel (m/s)  Median Acr Palm Anti Sensory (2nd Digit)  32.4C  Wrist       Wrist Palm     Palm 1.7   37.5         Radial Anti Sensory (Base 1st Digit)  32.2C  Wrist 2.0   29.7   Wrist Base 1st Digit     Ulnar Anti Sensory (5th Digit)  32.5C  Wrist 3.2   26.0   Wrist 5th Digit 44     Motor Left/Right Comparison   Stim Site  L Lat (ms) R Lat (ms) L-R Lat (ms) L Amp (mV) R Amp (mV) L-R Amp (%) Site1 Site2 L Vel (m/s) R Vel (m/s) L-R Vel (m/s)  Median Motor (Abd Poll Brev)  32.2C  Wrist *7.7   *0.6   Elbow Wrist 64    Elbow 10.5   0.3         Ulnar Motor (Abd Dig Min)  32.3C  Wrist 2.7   5.1   B Elbow Wrist 61    B Elbow 5.5   4.9   A Elbow B Elbow 71    A Elbow 6.9   2.7            Waveforms:            Clinical History: No specialty comments available.   She reports that she has never smoked. She has never used smokeless tobacco. No results for input(s): HGBA1C, LABURIC in the last 8760 hours.  Objective:  VS:  HT:    WT:   BMI:     BP:   HR: bpm  TEMP: ( )  RESP:  Physical Exam Musculoskeletal:        General: No swelling, tenderness or deformity.     Comments: Inspection reveals atrophy of the left APB but no atrophy of the bilateral FDI or hand intrinsics. There is no swelling, color changes, allodynia or dystrophic changes. There is 5 out of 5 strength in the bilateral wrist extension, finger abduction and long finger flexion. There is imparied sensation to light touch in the median nerve peripheral nerve distribution. There is a negative Hoffmann's test bilaterally.  Skin:    General: Skin is warm and dry.     Findings: No erythema or rash.  Neurological:     General: No focal deficit present.     Mental Status: She is alert and oriented to person, place, and time.     Motor: No weakness or abnormal muscle tone.     Coordination: Coordination normal.  Psychiatric:         Mood and Affect: Mood normal.        Behavior: Behavior normal.     Ortho Exam Imaging: No results found.  Past Medical/Family/Surgical/Social History: Medications & Allergies reviewed per EMR, new medications updated. Patient Active Problem List   Diagnosis Date Noted  . BMI 40.0-44.9, adult (HCC) 07/12/2018  . Primary osteoarthritis of both knees 12/24/2016  . 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 DDD (degenerative disc disease), lumbar 11/17/2016  . Vitamin D deficiency 11/17/2016  . History of rosacea/ History of acne 11/17/2016  . High risk medications (not anticoagulants) long-term use 06/25/2016  . History of degenerative disc disease 06/25/2016  . Severe obesity (BMI >= 40) (HCC) 06/13/2014  . Left-sided thoracic back pain 07/19/2013  . PALPITATIONS 10/17/2009  . UTI 05/24/2009  . Rheumatoid arthritis (HCC) 05/24/2009  . Hair loss 02/22/2009  . VAGINAL BLEEDING 10/05/2008  . Carrier of group B Streptococcus 07/28/2007  . GERD 12/28/2006  . CARDIAC MURMUR, HX OF 12/28/2006  . HIATAL HERNIA, HX OF 12/28/2006   Past Medical History:  Diagnosis Date  . Allergy   . Arthritis   . GERD (gastroesophageal reflux disease)   . Heart murmur   . Rheumatoid arthritis (HCC)   . 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    Family History  Problem Relation Age of Onset  . Uterine cancer Mother   . Diabetes Father   . Kidney cancer Father   . Colon  cancer Sister [id number]  . Rectal cancer Sister   . Colon cancer Paternal Grandfather   . Colon polyps Brother   . Cancer Sister        breast   . Healthy Daughter   . Esophageal cancer Neg Hx   . Stomach cancer Neg Hx    Past Surgical History:  Procedure Laterality Date  . COLONOSCOPY  2014   DB- normal   . NO PAST SURGERIES    . 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 Smoking status: Never Smoker  . Smokeless tobacco: Never Used   Substance and Sexual Activity  . Alcohol use: Yes    Comment: rare alcohol use-once/twice a year  . Drug use: Never  . Sexual activity: Yes    Partners: Male

## 2018-11-16 NOTE — Progress Notes (Signed)
 .  Numeric Pain Rating Scale and Functional Assessment Average Pain 0   In the last MONTH (on 0-10 scale) has pain interfered with the following?  1. General activity like being  able to carry out your everyday physical activities such as walking, climbing stairs, carrying groceries, or moving a chair?  Rating(6)    

## 2018-11-16 NOTE — Telephone Encounter (Signed)
-----   Message from Bo Merino, MD sent at 11/16/2018 12:55 PM EDT ----- Patient has left CTS . Please, discuss with pt and refer her for CTR. Thank you SD ----- Message ----- From: Magnus Sinning, MD Sent: 11/16/2018  11:49 AM EDT To: Bo Merino, MD  Report to follow but she has severe Left median nerve entrapment at the wrist. Seems like some level of conduction block which may better prognostically but she will need surgery referral for decompression. Thanks FN

## 2018-11-17 ENCOUNTER — Ambulatory Visit: Payer: BC Managed Care – PPO | Admitting: Orthopaedic Surgery

## 2018-11-17 NOTE — Procedures (Signed)
EMG & NCV Findings: Evaluation of the left median motor nerve showed prolonged distal onset latency (7.7 ms) and reduced amplitude (0.6 mV).  The left median (across palm) sensory nerve showed no response (Wrist).  All remaining nerves (as indicated in the following tables) were within normal limits.    Needle evaluation of the left abductor pollicis brevis muscle showed increased insertional activity and moderately increased spontaneous activity.  All remaining muscles (as indicated in the following table) showed no evidence of electrical instability.    Impression: The above electrodiagnostic study is ABNORMAL and reveals evidence of a severe left median nerve entrapment at the **wrist (carpal tunnel syndrome) affecting sensory and motor components. **There appears to be some level of conduction block when looking at the sensory nerve action potential measured with stimulation above the wrist versus in the palm.  This could portend a better clinical outcome with decompression.  However, when measuring the motor study there is some indication this may be more of a proximal lesion instead of just at the wrist.  Clinical exam had good sensation to palm side of the thumb which indicated intact superficial branch. Ultrasound evaluation may be beneficial along the root and course of the median nerve.  There is no significant electrodiagnostic evidence of any other focal nerve entrapment, brachial plexopathy or cervical radiculopathy.   Recommendations: 1.  Follow-up with referring physician. 2.  Continue current management of symptoms. 3.  Suggest surgical evaluation.  ___________________________ Laurence Spates FAAPMR Board Certified, American Board of Physical Medicine and Rehabilitation    Nerve Conduction Studies Anti Sensory Summary Table   Stim Site NR Peak (ms) Norm Peak (ms) P-T Amp (V) Norm P-T Amp Site1 Site2 Delta-P (ms) Dist (cm) Vel (m/s) Norm Vel (m/s)  Left Median Acr Palm Anti  Sensory (2nd Digit)  32.4C  Wrist *NR  <3.6  >10 Wrist Palm  0.0    Palm    1.7 <2.0 37.5         Left Radial Anti Sensory (Base 1st Digit)  32.2C  Wrist    2.0 <3.1 29.7  Wrist Base 1st Digit 2.0 0.0    Left Ulnar Anti Sensory (5th Digit)  32.5C  Wrist    3.2 <3.7 26.0 >15.0 Wrist 5th Digit 3.2 14.0 44 >38   Motor Summary Table   Stim Site NR Onset (ms) Norm Onset (ms) O-P Amp (mV) Norm O-P Amp Site1 Site2 Delta-0 (ms) Dist (cm) Vel (m/s) Norm Vel (m/s)  Left Median Motor (Abd Poll Brev)  32.2C  Wrist    *7.7 <4.2 *0.6 >5 Elbow Wrist 2.8 18.0 64 >50  Elbow    10.5  0.3         Left Ulnar Motor (Abd Dig Min)  32.3C  Wrist    2.7 <4.2 5.1 >3 B Elbow Wrist 2.8 17.0 61 >53  B Elbow    5.5  4.9  A Elbow B Elbow 1.4 10.0 71 >53  A Elbow    6.9  2.7          EMG   Side Muscle Nerve Root Ins Act Fibs Psw Amp Dur Poly Recrt Int Fraser Din Comment  Left Abd Poll Brev Median C8-T1 *Incr *2+ *2+ Nml Nml 0 Nml Nml   Left 1stDorInt Ulnar C8-T1 Nml Nml Nml Nml Nml 0 Nml Nml   Left PronatorTeres Median C6-7 Nml Nml Nml Nml Nml 0 Nml Nml   Left Biceps Musculocut C5-6 Nml Nml Nml Nml Nml 0 Nml Nml  Left Deltoid Axillary C5-6 Nml Nml Nml Nml Nml 0 Nml Nml     Nerve Conduction Studies Anti Sensory Left/Right Comparison   Stim Site L Lat (ms) R Lat (ms) L-R Lat (ms) L Amp (V) R Amp (V) L-R Amp (%) Site1 Site2 L Vel (m/s) R Vel (m/s) L-R Vel (m/s)  Median Acr Palm Anti Sensory (2nd Digit)  32.4C  Wrist       Wrist Palm     Palm 1.7   37.5         Radial Anti Sensory (Base 1st Digit)  32.2C  Wrist 2.0   29.7   Wrist Base 1st Digit     Ulnar Anti Sensory (5th Digit)  32.5C  Wrist 3.2   26.0   Wrist 5th Digit 44     Motor Left/Right Comparison   Stim Site L Lat (ms) R Lat (ms) L-R Lat (ms) L Amp (mV) R Amp (mV) L-R Amp (%) Site1 Site2 L Vel (m/s) R Vel (m/s) L-R Vel (m/s)  Median Motor (Abd Poll Brev)  32.2C  Wrist *7.7   *0.6   Elbow Wrist 64    Elbow 10.5   0.3         Ulnar Motor (Abd  Dig Min)  32.3C  Wrist 2.7   5.1   B Elbow Wrist 61    B Elbow 5.5   4.9   A Elbow B Elbow 71    A Elbow 6.9   2.7            Waveforms:

## 2018-11-23 ENCOUNTER — Ambulatory Visit (INDEPENDENT_AMBULATORY_CARE_PROVIDER_SITE_OTHER): Payer: BC Managed Care – PPO | Admitting: Orthopaedic Surgery

## 2018-11-23 ENCOUNTER — Other Ambulatory Visit: Payer: Self-pay

## 2018-11-23 ENCOUNTER — Encounter: Payer: Self-pay | Admitting: Orthopaedic Surgery

## 2018-11-23 ENCOUNTER — Ambulatory Visit: Payer: Self-pay

## 2018-11-23 VITALS — Ht 63.0 in | Wt 225.0 lb

## 2018-11-23 DIAGNOSIS — M25562 Pain in left knee: Secondary | ICD-10-CM

## 2018-11-23 DIAGNOSIS — G5602 Carpal tunnel syndrome, left upper limb: Secondary | ICD-10-CM | POA: Diagnosis not present

## 2018-11-23 DIAGNOSIS — M1712 Unilateral primary osteoarthritis, left knee: Secondary | ICD-10-CM | POA: Diagnosis not present

## 2018-11-23 DIAGNOSIS — G8929 Other chronic pain: Secondary | ICD-10-CM

## 2018-11-23 DIAGNOSIS — M170 Bilateral primary osteoarthritis of knee: Secondary | ICD-10-CM

## 2018-11-23 NOTE — Progress Notes (Signed)
Office Visit Note   Patient: Donna Lopez           Date of Birth: 1965-05-22           MRN: [id number] Visit Date: 11/23/2018              Requested by: Bo Merino, MD [address] [place],  [place] [address] PCP: Carollee Herter, Alferd Apa, DO   Assessment & Plan: Visit Diagnoses:  1. Chronic pain of left knee   2. Primary osteoarthritis of left knee   3. Primary osteoarthritis of both knees   4. Carpal tunnel syndrome, left upper limb     Plan: Due to the fact that patient is failed conservative treatment has tricompartmental arthritis with bone-on-bone medial compartment with severe patellofemoral compartment recommend left total knee arthroplasty.  Questions encouraged and answered at length by Dr. Ninfa Linden and myself.  Knee model showing the arthroplasty components was reviewed with the patient.  Postop protocol reviewed with patient.  Risk benefits of surgery discussed with the patient.  She understands the risks include but are not limited to nerve/vessel injury, DVT/PE, blood loss, and infection, wound healing, problems prolonged pain and worsening pain.  Have her follow-up in 2 weeks postop.  She also has severe carpal tunnel syndrome involving the left median nerve by EMG nerve conduction studies done by Dr. Ernestina Patches last week.  She will need to address this in the future with a carpal tunnel release would recommend that after her left total knee surgery.  Follow-Up Instructions: Return for 2 weeks post-op.   Orders:  Orders Placed This Encounter  Procedures  . XR Knee 1-2 Views Left   No orders of the defined types were placed in this encounter.     Procedures: No procedures performed   Clinical Data: No additional findings.   Subjective: Chief Complaint  Patient presents with  . Left Knee - Pain  . Left Hand - Pain    HPI Donna Lopez is a 53 year old female comes in today due to left knee pain.  States she has had pain for years but is overall  getting worse.  She states the gel shots of helped in the past but are no longer covered by insurance.  She does have some giving way of the knee but no other mechanical symptoms.  She is had no new injury to the knee.  Left knee pain keeps her from participating in activities with her family.  She has lost approximately 40 pounds through diet over the last few months.  She is been working on quad strengthening continues to have significant pain in the left knee.  She denies any cardiac history outside of heart murmur and she is nondiabetic.  She is seen by Dr. Estanislado Pandy for her rheumatoid arthritis.  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  Review of Systems  Constitutional: Negative for chills, fever and unexpected weight change.     Objective: Vital Signs: Ht 5\' 3"  (1.6 m)   Wt 225 lb (102.1 kg)   BMI 39.86 kg/m   Physical Exam Constitutional:      Appearance: She is not ill-appearing or diaphoretic.  Pulmonary:     Effort: Pulmonary effort is normal.  Neurological:     Mental Status: She is alert and oriented to person, place, and time.  Psychiatric:        Mood and Affect: Mood normal.     Ortho Exam Slight medial joint line tenderness. Full  extension and flexion to approximately 100 degrees. No instability . Patellar femoral crepitus. Slight varus deformity.   Specialty Comments:  No specialty comments available.  Imaging: Xr Knee 1-2 Views Left  Result Date: 11/23/2018 Left knee: 2 views show no acute fracture.  She has bone-on-bone medial compartment.  Mild lateral compartmental changes.  Severe patellofemoral compartmental changes.  No other bony abnormalities.    PMFS History: Patient Active Problem List   Diagnosis Date Noted  . Primary osteoarthritis of left knee 11/23/2018  . Carpal tunnel syndrome, left upper limb 11/23/2018  . BMI 40.0-44.9, adult (HCC) 07/12/2018  . Primary osteoarthritis of both knees 12/24/2016  . DDD (degenerative disc disease),  thoracic 11/17/2016  . DDD (degenerative disc disease), lumbar 11/17/2016  . Vitamin D deficiency 11/17/2016  . History of rosacea/ History of acne 11/17/2016  . High risk medications (not anticoagulants) long-term use 06/25/2016  . History of degenerative disc disease 06/25/2016  . Severe obesity (BMI >= 40) (HCC) 06/13/2014  . Left-sided thoracic back pain 07/19/2013  . PALPITATIONS 10/17/2009  . UTI 05/24/2009  . Rheumatoid arthritis (HCC) 05/24/2009  . Hair loss 02/22/2009  . VAGINAL BLEEDING 10/05/2008  . Carrier of group B Streptococcus 07/28/2007  . GERD 12/28/2006  . CARDIAC MURMUR, HX OF 12/28/2006  . HIATAL HERNIA, HX OF 12/28/2006   Past Medical History:  Diagnosis Date  . Allergy   . Arthritis   . GERD (gastroesophageal reflux disease)   . Heart murmur   . Rheumatoid arthritis (HCC)   . 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 Uterine cancer Mother   . Diabetes Father   . Kidney cancer Father   . Colon cancer Sister 64  . Rectal cancer Sister   . Colon cancer Paternal Grandfather   . Colon polyps Brother   . Cancer Sister        breast   . Healthy Daughter   . Esophageal cancer Neg Hx   . Stomach cancer Neg Hx     Past Surgical History:  Procedure Laterality Date  . COLONOSCOPY  2014   DB- normal   . NO PAST SURGERIES    . 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 Smoking status: Never Smoker  . Smokeless tobacco: Never Used  Substance and Sexual Activity  . Alcohol use: Yes    Comment: rare alcohol use-once/twice a year  . Drug use: Never  . Sexual activity: Yes    Partners: Male

## 2018-11-24 ENCOUNTER — Encounter: Payer: Self-pay | Admitting: Rheumatology

## 2018-11-30 ENCOUNTER — Encounter: Payer: Self-pay | Admitting: Orthopaedic Surgery

## 2018-12-01 DIAGNOSIS — F4320 Adjustment disorder, unspecified: Secondary | ICD-10-CM | POA: Diagnosis not present

## 2018-12-03 ENCOUNTER — Telehealth: Payer: Self-pay | Admitting: Rheumatology

## 2018-12-03 NOTE — Telephone Encounter (Signed)
Returned patient call.  She states she has had difficulty receiving her medication from [facility].  [facility] keeps deleting the prescriptions letter sent from our office.  We have had issues with several other patients.  Will contact the pharmacy and send a new prescription if needed.  Patient has surgery scheduled for 12/17/2018.  She was told by our office to skip her dose today.  Her surgeon Dr. Ninfa Linden told patient there would not be a problem with her taking today's dose.  Advised patient that we would prefer her to hold her dose today to reduce any risk of infection or complications from surgery.  Patient verbalized understanding.  All questions encouraged and answered.  Instructed patient to call with any further questions or concerns.  Mariella Saa, PharmD, [facility] Rheumatology Clinical Pharmacist  12/03/2018 3:39 PM

## 2018-12-03 NOTE — Telephone Encounter (Signed)
Patient called stating her knee surgery is scheduled for 12/17/18 with Dr. Ninfa Linden.  Patient states she spoke with Seth Bake who told her to skip her next dose of Humira.  Patient states she took her last Humira injection on 11/22/18 and is worried that she would be off her medication for over 4 weeks between stopping it before surgery and waiting 1-2 weeks after surgery.  Patient states she spoke with Dr. Ninfa Linden who stated she could have one more Humira injection 2 weeks prior to surgery which is today 12/03/18.  Patient is requesting a return call to discuss if she should take another injection and would need to pick up a sample since [facility] has not received a new prescription from Dr. Estanislado Pandy.

## 2018-12-06 NOTE — Telephone Encounter (Signed)
Called [facility] to check status of prescription.  The prescription on sent on 10/27/2018 was not cancelled but needed clarification.  The saw it as a dose change because it was sent in for 2 with a 56 day supply.  Received clarification from our office on 7/17 and prescription is active and in process.

## 2018-12-07 ENCOUNTER — Other Ambulatory Visit: Payer: Self-pay | Admitting: Physician Assistant

## 2018-12-09 ENCOUNTER — Encounter: Payer: Self-pay | Admitting: Orthopaedic Surgery

## 2018-12-14 ENCOUNTER — Other Ambulatory Visit: Payer: Self-pay

## 2018-12-14 ENCOUNTER — Encounter
Admission: RE | Admit: 2018-12-14 | Discharge: 2018-12-14 | Disposition: A | Discharge status: Home / Self Care | Payer: BC Managed Care – PPO | Source: Ambulatory Visit | Attending: Orthopaedic Surgery | Admitting: Orthopaedic Surgery

## 2018-12-14 DIAGNOSIS — Z1159 Encounter for screening for other viral diseases: Secondary | ICD-10-CM | POA: Insufficient documentation

## 2018-12-15 ENCOUNTER — Encounter (HOSPITAL_COMMUNITY): Payer: Self-pay

## 2018-12-15 ENCOUNTER — Ambulatory Visit
Admission: RE | Admit: 2018-12-15 | Discharge: 2018-12-15 | Disposition: A | Discharge status: Home / Self Care | Payer: BC Managed Care – PPO | Source: Ambulatory Visit | Attending: Family Medicine | Admitting: Family Medicine

## 2018-12-15 DIAGNOSIS — Z1231 Encounter for screening mammogram for malignant neoplasm of breast: Secondary | ICD-10-CM | POA: Diagnosis not present

## 2018-12-15 DIAGNOSIS — F4320 Adjustment disorder, unspecified: Secondary | ICD-10-CM | POA: Diagnosis not present

## 2018-12-15 LAB — SARS CORONAVIRUS 2 (TAT 6-24 HRS): SARS Coronavirus 2: NEGATIVE

## 2018-12-15 NOTE — Patient Instructions (Addendum)
DUE TO COVID-19 ONLY ONE VISITOR IS ALLOWED IN THE HOSPITAL AT THIS TIME   COVID SWAB TESTING COMPLETED ON: December 14, 2018 (Must self quarantine after testing. Follow instructions on handout.)             Your procedure is scheduled on: Friday, December 17, 2018   [facility]    Report to admitting at 6:00 AM   Call this number if you have problems the morning of surgery [phone]   Do not eat food:After Midnight.   May have liquids until 5:30AM day of surgery   CLEAR LIQUID DIET  Foods Allowed                                                                     Foods Excluded  Water, Black Coffee and tea, regular and decaf                             liquids that you cannot  Plain Jell-O in any flavor  (No red)                                           see through such as: Fruit ices (not with fruit pulp)                                     milk, soups, orange juice  Iced Popsicles (No red)                                    All solid food Carbonated beverages, regular and diet                                    Apple juices Sports drinks like Gatorade (No red) Lightly seasoned clear broth or consume(fat free) Sugar, honey syrup   Complete one Ensure drink the morning of surgery at 5:30AM the day of surgery.   Brush your teeth the morning of surgery.   Do NOT smoke after Midnight   Take these medicines the morning of surgery with A SIP OF WATER: Esomeprazole, (Nexium)                               You may not have any metal on your body including hair pins, jewelry, and body piercings             Do not wear make-up, lotions, powders, perfumes/cologne, or deodorant             Do not wear nail polish.  Do not shave  48 hours prior to surgery.              Do not bring valuables to the hospital. [facility] IS NOT  RESPONSIBLE   FOR VALUABLES.   Contacts, dentures or bridgework may not be worn into surgery.   Bring small  overnight bag day of surgery. It will stay with you to short stay.    Special Instructions: Bring a copy of your healthcare power of attorney and living will documents         the day of surgery if you haven't scanned them in before.              Please read over the following fact sheets you were given:  No family members are allowed in short stay  [facility] - Preparing for Surgery Before surgery, you can play an important role .  Because skin is not sterile, your skin needs to be as free of germs as possible .  You can reduce the number of germs on your skin by washing with CHG (chlorahexidine gluconate) soap before surgery.   CHG is an antiseptic cleaner which kills germs and bonds with the skin to continue killing germs even after washing. Please DO NOT use if you have an allergy to CHG or antibacterial soaps.   If your skin becomes reddened/irritated stop using the CHG and inform your nurse when you arrive at Short Stay. Do not shave (including legs and underarms) for at least 48 hours prior to the first CHG shower.   Please follow these instructions carefully:  1.  Shower with CHG Soap the night before surgery and the  morning of surgery.  2.  If you choose to wash your hair, wash your hair first as usual with your normal  shampoo.  3.  After you shampoo, rinse your hair and body thoroughly to remove the shampoo.                             4.  Use CHG as you would any other liquid soap.  You can apply chg directly to the skin and wash.  Gently with a scrungie or clean washcloth.  5.  Apply the CHG Soap to your body ONLY FROM THE NECK DOWN.   Do not use on face/ open                           Wound or open sores. Avoid contact with eyes, ears mouth and genitals (private parts).                       Wash face,  Genitals (private parts) with your normal soap.             6.  Wash thoroughly, paying special attention to the area where your surgery  will be performed.  7.  Thoroughly  rinse your body with warm water from the neck down.  8.  DO NOT shower/wash with your normal soap after using and rinsing off the CHG Soap.             9.  Pat yourself dry with a clean towel.            10.  Wear clean pajamas.            11.  Place clean sheets on your bed the night of your first shower and do not  sleep with pets. Day of Surgery : Do not apply any lotions/deodorants the morning of surgery.  Please wear clean clothes to the hospital/surgery center.  FAILURE TO FOLLOW THESE INSTRUCTIONS MAY RESULT IN THE CANCELLATION OF YOUR SURGERY  PATIENT SIGNATURE_________________________________  NURSE SIGNATURE__________________________________  ________________________________________________________________________   Adam Phenix  An incentive spirometer is a tool that can help keep your lungs clear and active. This tool measures how well you are filling your lungs with each breath. Taking long deep breaths may help reverse or decrease the chance of developing breathing (pulmonary) problems (especially infection) following:  A long period of time when you are unable to move or be active. BEFORE THE PROCEDURE   If the spirometer includes an indicator to show your best effort, your nurse or respiratory therapist will set it to a desired goal.  If possible, sit up straight or lean slightly forward. Try not to slouch.  Hold the incentive spirometer in an upright position. INSTRUCTIONS FOR USE  1. Sit on the edge of your bed if possible, or sit up as far as you can in bed or on a chair. 2. Hold the incentive spirometer in an upright position. 3. Breathe out normally. 4. Place the mouthpiece in your mouth and seal your lips tightly around it. 5. Breathe in slowly and as deeply as possible, raising the piston or the ball toward the top of the column. 6. Hold your breath for 3-5 seconds or for as long as possible. Allow the piston or ball to fall to the bottom of the  column. 7. Remove the mouthpiece from your mouth and breathe out normally. 8. Rest for a few seconds and repeat Steps 1 through 7 at least 10 times every 1-2 hours when you are awake. Take your time and take a few normal breaths between deep breaths. 9. The spirometer may include an indicator to show your best effort. Use the indicator as a goal to work toward during each repetition. 10. After each set of 10 deep breaths, practice coughing to be sure your lungs are clear. If you have an incision (the cut made at the time of surgery), support your incision when coughing by placing a pillow or rolled up towels firmly against it. Once you are able to get out of bed, walk around indoors and cough well. You may stop using the incentive spirometer when instructed by your caregiver.  RISKS AND COMPLICATIONS  Take your time so you do not get dizzy or light-headed.  If you are in pain, you may need to take or ask for pain medication before doing incentive spirometry. It is harder to take a deep breath if you are having pain. AFTER USE  Rest and breathe slowly and easily.  It can be helpful to keep track of a log of your progress. Your caregiver can provide you with a simple table to help with this. If you are using the spirometer at home, follow these instructions: [place] IF:   You are having difficultly using the spirometer.  You have trouble using the spirometer as often as instructed.  Your pain medication is not giving enough relief while using the spirometer.  You develop fever of 100.5 F (38.1 C) or higher. SEEK IMMEDIATE MEDICAL CARE IF:   You cough up bloody sputum that had not been present before.  You develop fever of 102 F (38.9 C) or greater.  You develop worsening pain at or near the incision site. MAKE SURE YOU:   Understand these instructions.  Will watch your condition.  Will get help right away if you are not doing well or get worse. Document Released:  09/15/2006 Document Revised: 07/28/2011 Document Reviewed: 11/16/2006 ExitCare Patient Information 2014 [place], [place].   ________________________________________________________________________

## 2018-12-16 ENCOUNTER — Encounter (HOSPITAL_COMMUNITY): Payer: Self-pay

## 2018-12-16 ENCOUNTER — Encounter: Payer: Self-pay | Admitting: Rheumatology

## 2018-12-16 ENCOUNTER — Other Ambulatory Visit: Payer: Self-pay

## 2018-12-16 ENCOUNTER — Other Ambulatory Visit: Payer: Self-pay | Admitting: Physician Assistant

## 2018-12-16 ENCOUNTER — Other Ambulatory Visit (HOSPITAL_COMMUNITY): Payer: BC Managed Care – PPO

## 2018-12-16 ENCOUNTER — Encounter (HOSPITAL_COMMUNITY)
Admission: RE | Admit: 2018-12-16 | Discharge: 2018-12-16 | Disposition: A | Discharge status: Home / Self Care | Payer: BC Managed Care – PPO | Source: Ambulatory Visit | Attending: Orthopaedic Surgery | Admitting: Orthopaedic Surgery

## 2018-12-16 DIAGNOSIS — Z01812 Encounter for preprocedural laboratory examination: Secondary | ICD-10-CM | POA: Diagnosis not present

## 2018-12-16 HISTORY — DX: Vitamin D deficiency, unspecified: E55.9

## 2018-12-16 HISTORY — DX: Other intervertebral disc degeneration, lumbar region without mention of lumbar back pain or lower extremity pain: M51.369

## 2018-12-16 HISTORY — DX: Unspecified osteoarthritis, unspecified site: M19.90

## 2018-12-16 HISTORY — DX: Obesity, unspecified: E66.9

## 2018-12-16 HISTORY — DX: Other intervertebral disc degeneration, thoracic region: M51.34

## 2018-12-16 HISTORY — DX: Other intervertebral disc degeneration, lumbar region: M51.36

## 2018-12-16 HISTORY — DX: Personal history of other diseases of the digestive system: Z87.19

## 2018-12-16 HISTORY — DX: Family history of other specified conditions: Z84.89

## 2018-12-16 HISTORY — DX: Palpitations: R00.2

## 2018-12-16 LAB — SURGICAL PCR SCREEN
MRSA, PCR: NEGATIVE
Staphylococcus aureus: POSITIVE — AB

## 2018-12-16 LAB — CBC
HCT: 40.1 % (ref 36.0–46.0)
Hemoglobin: 13.2 g/dL (ref 12.0–15.0)
MCH: 30.6 pg (ref 26.0–34.0)
MCHC: 32.9 g/dL (ref 30.0–36.0)
MCV: 93 fL (ref 80.0–100.0)
Platelets: 270 10*3/uL (ref 150–400)
RBC: 4.31 MIL/uL (ref 3.87–5.11)
RDW: 16 % — ABNORMAL HIGH (ref 11.5–15.5)
WBC: 9.2 10*3/uL (ref 4.0–10.5)
nRBC: 0 % (ref 0.0–0.2)

## 2018-12-16 NOTE — Telephone Encounter (Signed)
Last Visit: 11/15/18 Next Visit: 04/18/19 Labs: 10/28/18 Glucose is elevated-126. Rest of CMP WNL. CBC WNL  Okay to refill per Dr. Estanislado Pandy

## 2018-12-16 NOTE — Progress Notes (Signed)
Konrad Felix PA  Rheumatoid meds were stopped 7/6 per Dr. Ninfa Linden. Her meds are managed by Dr. Estanislado Pandy. Dr. Ninfa Linden said it was okay for her to continue with her ASA. Labs are in [facility]

## 2018-12-17 ENCOUNTER — Ambulatory Visit (HOSPITAL_COMMUNITY): Payer: BC Managed Care – PPO | Admitting: Physician Assistant

## 2018-12-17 ENCOUNTER — Ambulatory Visit (HOSPITAL_COMMUNITY): Payer: BC Managed Care – PPO | Admitting: Certified Registered"

## 2018-12-17 ENCOUNTER — Encounter (HOSPITAL_COMMUNITY)
Admission: RE | Disposition: A | Discharge status: Home / Self Care | Payer: Self-pay | Source: Home / Self Care | Attending: Orthopaedic Surgery

## 2018-12-17 ENCOUNTER — Encounter (HOSPITAL_COMMUNITY): Payer: Self-pay | Admitting: *Deleted

## 2018-12-17 ENCOUNTER — Inpatient Hospital Stay (HOSPITAL_COMMUNITY): Payer: BC Managed Care – PPO

## 2018-12-17 ENCOUNTER — Ambulatory Visit (HOSPITAL_COMMUNITY)
Admission: RE | Admit: 2018-12-17 | Discharge: 2018-12-19 | Disposition: A | Discharge status: Home / Self Care | Payer: BC Managed Care – PPO | Attending: Orthopaedic Surgery | Admitting: Orthopaedic Surgery

## 2018-12-17 ENCOUNTER — Other Ambulatory Visit: Payer: Self-pay

## 2018-12-17 DIAGNOSIS — K219 Gastro-esophageal reflux disease without esophagitis: Secondary | ICD-10-CM | POA: Diagnosis not present

## 2018-12-17 DIAGNOSIS — M1712 Unilateral primary osteoarthritis, left knee: Secondary | ICD-10-CM | POA: Diagnosis not present

## 2018-12-17 DIAGNOSIS — Z7982 Long term (current) use of aspirin: Secondary | ICD-10-CM | POA: Diagnosis not present

## 2018-12-17 DIAGNOSIS — Z6839 Body mass index (BMI) 39.0-39.9, adult: Secondary | ICD-10-CM | POA: Insufficient documentation

## 2018-12-17 DIAGNOSIS — Z96652 Presence of left artificial knee joint: Secondary | ICD-10-CM | POA: Diagnosis not present

## 2018-12-17 DIAGNOSIS — M069 Rheumatoid arthritis, unspecified: Secondary | ICD-10-CM | POA: Insufficient documentation

## 2018-12-17 DIAGNOSIS — Z79899 Other long term (current) drug therapy: Secondary | ICD-10-CM | POA: Diagnosis not present

## 2018-12-17 DIAGNOSIS — Z471 Aftercare following joint replacement surgery: Secondary | ICD-10-CM | POA: Diagnosis not present

## 2018-12-17 DIAGNOSIS — N939 Abnormal uterine and vaginal bleeding, unspecified: Secondary | ICD-10-CM | POA: Diagnosis not present

## 2018-12-17 DIAGNOSIS — E559 Vitamin D deficiency, unspecified: Secondary | ICD-10-CM | POA: Insufficient documentation

## 2018-12-17 DIAGNOSIS — G5602 Carpal tunnel syndrome, left upper limb: Secondary | ICD-10-CM | POA: Diagnosis not present

## 2018-12-17 DIAGNOSIS — G8918 Other acute postprocedural pain: Secondary | ICD-10-CM | POA: Diagnosis not present

## 2018-12-17 HISTORY — PX: TOTAL KNEE ARTHROPLASTY: SHX125

## 2018-12-17 LAB — PREGNANCY, URINE: Preg Test, Ur: NEGATIVE

## 2018-12-17 SURGERY — ARTHROPLASTY, KNEE, TOTAL
Anesthesia: Spinal | Laterality: Left

## 2018-12-17 MED ORDER — ALUM & MAG HYDROXIDE-SIMETH 200-200-20 MG/5ML PO SUSP: 30.0000 mL | ORAL | Status: DC | PRN

## 2018-12-17 MED ORDER — TRANEXAMIC ACID-NACL 1000-0.7 MG/100ML-% IV SOLN
1000.0000 mg | INTRAVENOUS | Status: AC
  Administered 2018-12-17: 1000 mg via INTRAVENOUS
  Filled 2018-12-17: qty 100

## 2018-12-17 MED ORDER — OXYCODONE HCL 5 MG PO TABS
ORAL_TABLET | ORAL | Status: AC
  Administered 2018-12-17: 17:00:00 10 mg via ORAL
  Filled 2018-12-17: qty 2

## 2018-12-17 MED ORDER — METOCLOPRAMIDE HCL 5 MG/ML IJ SOLN: 5.0000 mg | Freq: Three times a day (TID) | INTRAMUSCULAR | Status: DC | PRN

## 2018-12-17 MED ORDER — OXYCODONE HCL 5 MG PO TABS
10.0000 mg | ORAL_TABLET | ORAL | Status: DC | PRN
  Administered 2018-12-17 – 2018-12-19 (×4): 10 mg via ORAL
  Filled 2018-12-17: qty 2

## 2018-12-17 MED ORDER — BUPIVACAINE-EPINEPHRINE (PF) 0.25% -1:200000 IJ SOLN
INTRAMUSCULAR | Status: AC
  Filled 2018-12-17: qty 30

## 2018-12-17 MED ORDER — HYDROMORPHONE HCL 1 MG/ML IJ SOLN
0.5000 mg | INTRAMUSCULAR | Status: DC | PRN
  Administered 2018-12-17 – 2018-12-18 (×3): 1 mg via INTRAVENOUS
  Filled 2018-12-17 (×3): qty 1

## 2018-12-17 MED ORDER — ONDANSETRON HCL 4 MG/2ML IJ SOLN: 4.0000 mg | Freq: Once | INTRAMUSCULAR | Status: DC | PRN

## 2018-12-17 MED ORDER — SODIUM CHLORIDE 0.9 % IR SOLN
Status: DC | PRN
  Administered 2018-12-17: 1000 mL

## 2018-12-17 MED ORDER — OXYCODONE HCL 5 MG PO TABS: 5.0000 mg | ORAL_TABLET | Freq: Once | ORAL | Status: DC | PRN

## 2018-12-17 MED ORDER — METHOCARBAMOL 500 MG IVPB - SIMPLE MED
INTRAVENOUS | Status: AC
  Administered 2018-12-17: 500 mg via INTRAVENOUS
  Filled 2018-12-17: qty 50

## 2018-12-17 MED ORDER — POLYETHYLENE GLYCOL 3350 17 G PO PACK: 17.0000 g | PACK | Freq: Every day | ORAL | Status: DC | PRN

## 2018-12-17 MED ORDER — FENTANYL CITRATE (PF) 100 MCG/2ML IJ SOLN
INTRAMUSCULAR | Status: AC
  Administered 2018-12-17: 100 ug
  Filled 2018-12-17: qty 2

## 2018-12-17 MED ORDER — FENTANYL CITRATE (PF) 100 MCG/2ML IJ SOLN
INTRAMUSCULAR | Status: AC
  Filled 2018-12-17: qty 2

## 2018-12-17 MED ORDER — GABAPENTIN 100 MG PO CAPS
100.0000 mg | ORAL_CAPSULE | Freq: Three times a day (TID) | ORAL | Status: DC
  Administered 2018-12-17 – 2018-12-19 (×6): 100 mg via ORAL
  Filled 2018-12-17 (×6): qty 1

## 2018-12-17 MED ORDER — L-LYSINE 1000 MG PO TABS: 1000.0000 mg | ORAL_TABLET | Freq: Every day | ORAL | Status: DC

## 2018-12-17 MED ORDER — MENTHOL 3 MG MT LOZG: 1.0000 | LOZENGE | OROMUCOSAL | Status: DC | PRN

## 2018-12-17 MED ORDER — CALCIUM CARBONATE-VITAMIN D 500-200 MG-UNIT PO TABS
1.0000 | ORAL_TABLET | Freq: Every day | ORAL | Status: DC
  Administered 2018-12-18 – 2018-12-19 (×2): 1 via ORAL
  Filled 2018-12-17 (×2): qty 1

## 2018-12-17 MED ORDER — ONDANSETRON HCL 4 MG/2ML IJ SOLN: 4.0000 mg | Freq: Four times a day (QID) | INTRAMUSCULAR | Status: DC | PRN

## 2018-12-17 MED ORDER — LACTATED RINGERS IV SOLN
INTRAVENOUS | Status: DC
  Administered 2018-12-17 (×3): via INTRAVENOUS

## 2018-12-17 MED ORDER — DEXAMETHASONE SODIUM PHOSPHATE 10 MG/ML IJ SOLN
INTRAMUSCULAR | Status: AC
  Filled 2018-12-17: qty 1

## 2018-12-17 MED ORDER — FENTANYL CITRATE (PF) 100 MCG/2ML IJ SOLN
INTRAMUSCULAR | Status: AC
  Administered 2018-12-17: 50 ug via INTRAVENOUS
  Filled 2018-12-17: qty 2

## 2018-12-17 MED ORDER — BUPIVACAINE-EPINEPHRINE 0.25% -1:200000 IJ SOLN
INTRAMUSCULAR | Status: DC | PRN
  Administered 2018-12-17: 30 mL

## 2018-12-17 MED ORDER — METOCLOPRAMIDE HCL 5 MG PO TABS: 5.0000 mg | ORAL_TABLET | Freq: Three times a day (TID) | ORAL | Status: DC | PRN

## 2018-12-17 MED ORDER — CLINDAMYCIN PHOSPHATE 900 MG/50ML IV SOLN
900.0000 mg | INTRAVENOUS | Status: AC
  Administered 2018-12-17: 900 mg via INTRAVENOUS
  Filled 2018-12-17: qty 50

## 2018-12-17 MED ORDER — ONDANSETRON HCL 4 MG/2ML IJ SOLN
INTRAMUSCULAR | Status: AC
  Filled 2018-12-17: qty 2

## 2018-12-17 MED ORDER — DIPHENHYDRAMINE HCL 12.5 MG/5ML PO ELIX: 12.5000 mg | ORAL_SOLUTION | ORAL | Status: DC | PRN

## 2018-12-17 MED ORDER — PHENYLEPHRINE 40 MCG/ML (10ML) SYRINGE FOR IV PUSH (FOR BLOOD PRESSURE SUPPORT)
PREFILLED_SYRINGE | INTRAVENOUS | Status: AC
  Filled 2018-12-17: qty 10

## 2018-12-17 MED ORDER — FOLIC ACID 1 MG PO TABS
2.0000 mg | ORAL_TABLET | Freq: Every day | ORAL | Status: DC
  Administered 2018-12-17 – 2018-12-19 (×3): 2 mg via ORAL
  Filled 2018-12-17 (×3): qty 2

## 2018-12-17 MED ORDER — PROPOFOL 10 MG/ML IV BOLUS
INTRAVENOUS | Status: AC
  Filled 2018-12-17: qty 80

## 2018-12-17 MED ORDER — VITAMIN D 25 MCG (1000 UNIT) PO TABS
2000.0000 [IU] | ORAL_TABLET | Freq: Every day | ORAL | Status: DC
  Administered 2018-12-17 – 2018-12-19 (×3): 2000 [IU] via ORAL
  Filled 2018-12-17 (×3): qty 2

## 2018-12-17 MED ORDER — SODIUM CHLORIDE 0.9 % IV SOLN
INTRAVENOUS | Status: DC | PRN
  Administered 2018-12-17: 25 ug/min via INTRAVENOUS

## 2018-12-17 MED ORDER — DOCUSATE SODIUM 100 MG PO CAPS
100.0000 mg | ORAL_CAPSULE | Freq: Two times a day (BID) | ORAL | Status: DC
  Administered 2018-12-17 – 2018-12-19 (×4): 100 mg via ORAL
  Filled 2018-12-17 (×4): qty 1

## 2018-12-17 MED ORDER — METHOCARBAMOL 500 MG IVPB - SIMPLE MED
500.0000 mg | Freq: Four times a day (QID) | INTRAVENOUS | Status: DC | PRN
  Administered 2018-12-17: 500 mg via INTRAVENOUS
  Filled 2018-12-17: qty 50

## 2018-12-17 MED ORDER — PANTOPRAZOLE SODIUM 40 MG PO TBEC
40.0000 mg | DELAYED_RELEASE_TABLET | Freq: Every day | ORAL | Status: DC
  Administered 2018-12-17 – 2018-12-19 (×3): 40 mg via ORAL
  Filled 2018-12-17 (×3): qty 1

## 2018-12-17 MED ORDER — CLINDAMYCIN PHOSPHATE 600 MG/50ML IV SOLN
600.0000 mg | Freq: Four times a day (QID) | INTRAVENOUS | Status: AC
  Administered 2018-12-17 (×2): 600 mg via INTRAVENOUS
  Filled 2018-12-17 (×2): qty 50

## 2018-12-17 MED ORDER — ASPIRIN 81 MG PO CHEW
81.0000 mg | CHEWABLE_TABLET | Freq: Two times a day (BID) | ORAL | Status: DC
  Administered 2018-12-17 – 2018-12-19 (×4): 81 mg via ORAL
  Filled 2018-12-17 (×4): qty 1

## 2018-12-17 MED ORDER — PROPOFOL 10 MG/ML IV BOLUS
INTRAVENOUS | Status: AC
  Filled 2018-12-17: qty 20

## 2018-12-17 MED ORDER — SODIUM CHLORIDE 0.9 % IV SOLN
INTRAVENOUS | Status: DC
  Administered 2018-12-17: 75 mL/h via INTRAVENOUS
  Administered 2018-12-18: 02:00:00 via INTRAVENOUS

## 2018-12-17 MED ORDER — PROPOFOL 500 MG/50ML IV EMUL
INTRAVENOUS | Status: DC | PRN
  Administered 2018-12-17: 135 ug/kg/min via INTRAVENOUS

## 2018-12-17 MED ORDER — PHENYLEPHRINE HCL (PRESSORS) 10 MG/ML IV SOLN
INTRAVENOUS | Status: AC
  Filled 2018-12-17: qty 1

## 2018-12-17 MED ORDER — ONDANSETRON HCL 4 MG PO TABS
4.0000 mg | ORAL_TABLET | Freq: Four times a day (QID) | ORAL | Status: DC | PRN
  Filled 2018-12-17: qty 1

## 2018-12-17 MED ORDER — MIDAZOLAM HCL 2 MG/2ML IJ SOLN
INTRAMUSCULAR | Status: AC
  Administered 2018-12-17: 2 mg
  Filled 2018-12-17: qty 2

## 2018-12-17 MED ORDER — OXYCODONE HCL 5 MG/5ML PO SOLN: 5.0000 mg | Freq: Once | ORAL | Status: DC | PRN

## 2018-12-17 MED ORDER — FENTANYL CITRATE (PF) 100 MCG/2ML IJ SOLN
25.0000 ug | INTRAMUSCULAR | Status: DC | PRN
  Administered 2018-12-17 (×3): 50 ug via INTRAVENOUS

## 2018-12-17 MED ORDER — POVIDONE-IODINE 10 % EX SWAB
2.0000 "application " | Freq: Once | CUTANEOUS | Status: AC
  Administered 2018-12-17: 2 via TOPICAL

## 2018-12-17 MED ORDER — ACETAMINOPHEN 325 MG PO TABS
325.0000 mg | ORAL_TABLET | Freq: Four times a day (QID) | ORAL | Status: DC | PRN
  Administered 2018-12-19: 650 mg via ORAL
  Filled 2018-12-17: qty 2

## 2018-12-17 MED ORDER — CHLORHEXIDINE GLUCONATE 4 % EX LIQD: 60.0000 mL | Freq: Once | CUTANEOUS | Status: DC

## 2018-12-17 MED ORDER — CALCIUM + D3 600-200 MG-UNIT PO TABS: 1.0000 | ORAL_TABLET | Freq: Every day | ORAL | Status: DC

## 2018-12-17 MED ORDER — OXYCODONE HCL 5 MG PO TABS
5.0000 mg | ORAL_TABLET | ORAL | Status: DC | PRN
  Administered 2018-12-17 – 2018-12-19 (×7): 10 mg via ORAL
  Filled 2018-12-17 (×10): qty 2

## 2018-12-17 MED ORDER — PHENOL 1.4 % MT LIQD: 1.0000 | OROMUCOSAL | Status: DC | PRN

## 2018-12-17 MED ORDER — PROPOFOL 10 MG/ML IV BOLUS
INTRAVENOUS | Status: DC | PRN
  Administered 2018-12-17 (×2): 20 mg via INTRAVENOUS

## 2018-12-17 MED ORDER — ONDANSETRON HCL 4 MG/2ML IJ SOLN
INTRAMUSCULAR | Status: DC | PRN
  Administered 2018-12-17: 4 mg via INTRAVENOUS

## 2018-12-17 MED ORDER — METHOCARBAMOL 500 MG PO TABS
500.0000 mg | ORAL_TABLET | Freq: Four times a day (QID) | ORAL | Status: DC | PRN
  Administered 2018-12-17 – 2018-12-19 (×6): 500 mg via ORAL
  Filled 2018-12-17 (×6): qty 1

## 2018-12-17 MED ORDER — DEXAMETHASONE SODIUM PHOSPHATE 10 MG/ML IJ SOLN
INTRAMUSCULAR | Status: DC | PRN
  Administered 2018-12-17: 8 mg via INTRAVENOUS

## 2018-12-17 MED ORDER — PHENYLEPHRINE 40 MCG/ML (10ML) SYRINGE FOR IV PUSH (FOR BLOOD PRESSURE SUPPORT)
PREFILLED_SYRINGE | INTRAVENOUS | Status: DC | PRN
  Administered 2018-12-17 (×2): 120 ug via INTRAVENOUS

## 2018-12-17 SURGICAL SUPPLY — 58 items
APL SKNCLS STERI-STRIP NONHPOA (GAUZE/BANDAGES/DRESSINGS)
BAG SPEC THK2 15X12 ZIP CLS (MISCELLANEOUS)
BAG ZIPLOCK 12X15 (MISCELLANEOUS) IMPLANT
BASEPLATE TIBIAL TRIATHALON 3 (Plate) ×1 IMPLANT
BEARIN INSERT TIBIAL SZ 3 11 (Insert) ×2 IMPLANT
BEARING INSERT TIBIAL SZ 3 11 (Insert) IMPLANT
BENZOIN TINCTURE PRP APPL 2/3 (GAUZE/BANDAGES/DRESSINGS) IMPLANT
BLADE SAG 18X100X1.27 (BLADE) IMPLANT
BLADE SURG SZ10 CARB STEEL (BLADE) ×4 IMPLANT
BNDG ELASTIC 6X5.8 VLCR STR LF (GAUZE/BANDAGES/DRESSINGS) ×3 IMPLANT
BOWL SMART MIX CTS (DISPOSABLE) ×1 IMPLANT
BSPLAT TIB 3 CMNT PRM STRL KN (Plate) ×1 IMPLANT
CEMENT BONE SIMPLEX SPEEDSET (Cement) ×2 IMPLANT
COVER SURGICAL LIGHT HANDLE (MISCELLANEOUS) ×2 IMPLANT
COVER WAND RF STERILE (DRAPES) IMPLANT
CUFF TOURN SGL QUICK 34 (TOURNIQUET CUFF) ×2
CUFF TRNQT CYL 34X4.125X (TOURNIQUET CUFF) ×1 IMPLANT
DECANTER SPIKE VIAL GLASS SM (MISCELLANEOUS) ×1 IMPLANT
DRAPE U-SHAPE 47X51 STRL (DRAPES) ×2 IMPLANT
DRSG PAD ABDOMINAL 8X10 ST (GAUZE/BANDAGES/DRESSINGS) ×4 IMPLANT
DURAPREP 26ML APPLICATOR (WOUND CARE) ×2 IMPLANT
ELECT REM PT RETURN 15FT ADLT (MISCELLANEOUS) ×2 IMPLANT
FEMORAL PEG DISTAL FIXATION (Orthopedic Implant) ×1 IMPLANT
FEMORAL TRIATH POST STAB  SZ3 (Orthopedic Implant) ×1 IMPLANT
FEMORAL TRIATH POST STAB SZ3 (Orthopedic Implant) IMPLANT
GAUZE SPONGE 4X4 12PLY STRL (GAUZE/BANDAGES/DRESSINGS) ×2 IMPLANT
GAUZE XEROFORM 1X8 LF (GAUZE/BANDAGES/DRESSINGS) ×1 IMPLANT
GLOVE BIO SURGEON STRL SZ7.5 (GLOVE) ×2 IMPLANT
GLOVE BIOGEL PI IND STRL 8 (GLOVE) ×2 IMPLANT
GLOVE BIOGEL PI INDICATOR 8 (GLOVE) ×2
GLOVE ECLIPSE 8.0 STRL XLNG CF (GLOVE) ×2 IMPLANT
GOWN STRL REUS W/TWL XL LVL3 (GOWN DISPOSABLE) ×4 IMPLANT
HANDPIECE INTERPULSE COAX TIP (DISPOSABLE) ×2
HOLDER FOLEY CATH W/STRAP (MISCELLANEOUS) ×1 IMPLANT
IMMOBILIZER KNEE 20 (SOFTGOODS) ×2
IMMOBILIZER KNEE 20 THIGH 36 (SOFTGOODS) ×1 IMPLANT
KIT TURNOVER KIT A (KITS) IMPLANT
NS IRRIG 1000ML POUR BTL (IV SOLUTION) ×2 IMPLANT
PACK TOTAL KNEE CUSTOM (KITS) ×2 IMPLANT
PADDING CAST COTTON 6X4 STRL (CAST SUPPLIES) ×4 IMPLANT
PATELLA 32MMX10MM (Knees) ×1 IMPLANT
PIN FLUTED HEDLESS FIX 3.5X1/8 (PIN) ×1 IMPLANT
PROTECTOR NERVE ULNAR (MISCELLANEOUS) ×2 IMPLANT
SET HNDPC FAN SPRY TIP SCT (DISPOSABLE) ×1 IMPLANT
SET PAD KNEE POSITIONER (MISCELLANEOUS) ×2 IMPLANT
STAPLER VISISTAT 35W (STAPLE) ×1 IMPLANT
STRIP CLOSURE SKIN 1/2X4 (GAUZE/BANDAGES/DRESSINGS) IMPLANT
SUT MNCRL AB 4-0 PS2 18 (SUTURE) IMPLANT
SUT VIC AB 0 CT1 27 (SUTURE) ×2
SUT VIC AB 0 CT1 27XBRD ANTBC (SUTURE) ×1 IMPLANT
SUT VIC AB 1 CT1 36 (SUTURE) ×4 IMPLANT
SUT VIC AB 2-0 CT1 27 (SUTURE) ×4
SUT VIC AB 2-0 CT1 TAPERPNT 27 (SUTURE) ×2 IMPLANT
TRAY FOLEY MTR SLVR 14FR STAT (SET/KITS/TRAYS/PACK) ×1 IMPLANT
TRAY FOLEY MTR SLVR 16FR STAT (SET/KITS/TRAYS/PACK) ×1 IMPLANT
WATER STERILE IRR 1000ML POUR (IV SOLUTION) ×3 IMPLANT
WRAP KNEE MAXI GEL POST OP (GAUZE/BANDAGES/DRESSINGS) ×2 IMPLANT
YANKAUER SUCT BULB TIP 10FT TU (MISCELLANEOUS) ×2 IMPLANT

## 2018-12-17 NOTE — Op Note (Signed)
NAME: Donna Lopez, Donna Lopez MEDICAL RECORD RC:[id number] ACCOUNT [id number] DATE OF BIRTH:1965/05/20 FACILITY: [facility] LOCATION: [facility] PHYSICIAN:Cosimo Schertzer [name], MD  OPERATIVE REPORT  DATE OF PROCEDURE:  12/17/2018  PREOPERATIVE DIAGNOSIS:  Severe primary osteoarthritis and degenerative joint disease, left knee.  POSTOPERATIVE DIAGNOSIS:  Severe primary osteoarthritis and degenerative joint disease, left knee.  PROCEDURE:  Left total knee arthroplasty.  IMPLANTS:  Stryker Triathlon cemented knee system with size 3 femur, size 3 tibial tray, 11 mm fixed-bearing polyethylene insert, size 32 patellar button.  SURGEON:  Vanita Panda. Magnus Ivan, MD  ASSISTANT:  Richardean Canal, PA-C  ANESTHESIA: 1.  Left lower extremity adductor canal block. 2.  Spinal.  ANTIBIOTICS:  900 mg IV clindamycin.  TOURNIQUET TIME:  Less than 1 hour.  COMPLICATIONS:  None.  INDICATIONS:  The patient is a 53 year old female with moderate obesity who has developed severe end-stage arthritis involving her left knee.  This was verified on clinical exam and x-ray showing significant loss of joint space throughout the knee.  At  this point, her left knee pain has detrimentally affected her mobility, her quality of life, and activities of daily living to the point she does wish to proceed with a total knee arthroplasty.  We had a long and thorough discussion about the surgery.   We talked about the risk of acute blood loss anemia, nerve and vessel injury, fracture, infection, DVT, and implant failure.  We talked about our goals being decreased pain, improved mobility, and overall improved quality of life.  DESCRIPTION OF PROCEDURE:  After informed consent was obtained and appropriate left knee was marked, she was brought to the operating room after an adductor canal block was obtained in the holding room of her left lower extremity.  In the operating room,  she was sat up on the table.  Spinal anesthesia was  then obtained.  She was then laid in supine position.  A Foley catheter was placed and a nonsterile tourniquet was placed around her upper left thigh.  Her left thigh, knee, leg, ankle, and foot were  prepped and draped with DuraPrep and sterile drapes including a sterile stockinette.  Time-out was called and she was identified as correct patient, correct left knee.  I then used an Esmarch to wrap that leg, and the tourniquet was inflated to 300 mm of  pressure.  I then made a direct midline incision over the patella and carried this proximally and distally.  We dissected down the knee joint and carried out a medial parapatellar arthrotomy, finding a moderate joint effusion and significant arthritis  in all 3 compartments of her left knee with loose bodies as well as periarticular osteophytes and complete loss and devoid of cartilage in the medial compartment of her knee.  We removed remnants of ACL, PCL, medial and lateral meniscus and osteophytes.   With the knee in a flexed position, we used our extramedullary cutting guide for making our proximal tibia cut.  We set the slope to be neutral correction of varus and valgus and taking 9 mm off the high side.  We made this cut without difficulty.  We  then went to the femur and used an intramedullary drill for the femur for our distal femoral cutting guide, setting this for a left knee at 5 degrees externally rotated and a 10 mm distal femoral cut.  We made this cut without difficulty and brought the  knee back down to full extension, and with a 9 mm extension block, she was just slightly  hyperextended.  We then went back to the femur and put our femoral sizing guide based off the epicondylar axis and Whiteside line.  Based off of this, we chose a  size 3 femur.  We put a 4-in-1 cutting block for a size 3 femur and made our anterior and posterior cuts, followed by our chamfer cuts.  We then made our femoral box cut.  Attention was then turned back to the tibia  which has a size 3 tibia tray for  coverage and made a keel punch off of this, setting the rotation off the tibial tubercle and the femur.  We made this cut without difficulty.  We then trialed the size 3 tibia followed by the size 3 femur.  Then we went with the 11 mm fixed-bearing  polyethylene insert, and we were pleased with the stability and range of motion.  We then made our patellar cut and drilled 3 holes for a size 32 patellar button.  With all trial implants in the knee, I put her through some cycles of motion.  We were  pleased with again stability and range of motion.  We then removed all instruments from the knee and irrigated the knee with normal saline solution using pulsatile lavage.  We mixed our cement and then cemented the real Stryker Triathlon tibial tray size  3, followed by the real size 3 left femur.  We placed our real 11 mm fixed-bearing polyethylene insert and cemented our patellar button.  Once the cement had hardened, we removed excess debris from the knee from cement and irrigated the knee again with  normal saline solution.  We then dried the knee real well.  The tourniquet was let down, and hemostasis was obtained with electrocautery.  We then closed the arthrotomy with interrupted #1 Vicryl suture followed by 0 Vicryl to close the deep tissue, 2-0  Vicryl to close the subcutaneous tissue, and interrupted staples to close the skin.  Xeroform and a well-padded sterile dressing were applied.  She was taken to the recovery room in stable condition.  All final counts were correct.  There were no  complications noted.  Of note, [name], PA-C, assisted the entire case.  His assistance was crucial for facilitating all aspects of this case.  LN/NUANCE  D:12/17/2018 T:12/17/2018 JOB:[id number]

## 2018-12-17 NOTE — Anesthesia Procedure Notes (Signed)
Procedure Name: MAC Date/Time: 12/17/2018 9:04 AM Performed by: Niel Hummer, CRNA Pre-anesthesia Checklist: Patient identified, Emergency Drugs available, Suction available and Patient being monitored Patient Re-evaluated:Patient Re-evaluated prior to induction Oxygen Delivery Method: Simple face mask

## 2018-12-17 NOTE — Evaluation (Signed)
Physical Therapy Evaluation Patient Details Name: Donna Lopez MRN: [id number] DOB: 24-Mar-1966 Today's Date: 12/17/2018   History of Present Illness  53 yo female s/p L TKR on 12/17/18. PMH includes DJD, RA, obesity.  Clinical Impression  Pt presents with L knee pain, decreased L knee ROM, difficulty performing mobility tasks, and decreased activity tolerance due to L knee pain.  Pt to benefit from acute PT to address deficits. Pt ambulated 15 ft with RW with min guard assist, verbal cuing for form and safety, limited by pain. Pt educated on ankle pumps (20/hour) to perform this afternoon/evening to increase circulation, to pt's tolerance and limited by pain. PT to progress mobility as tolerated, and will continue to follow acutely.        Follow Up Recommendations Follow surgeon's recommendation for DC plan and follow-up therapies;Supervision for mobility/OOB    Equipment Recommendations  None recommended by PT    Recommendations for Other Services       Precautions / Restrictions Precautions Precautions: Fall Required Braces or Orthoses: Knee Immobilizer - Left Knee Immobilizer - Left: On when out of bed or walking;Discontinue once straight leg raise with < 10 degree lag Restrictions Weight Bearing Restrictions: No Other Position/Activity Restrictions: WBAT      Mobility  Bed Mobility Overal bed mobility: Needs Assistance Bed Mobility: Supine to Sit     Supine to sit: Min assist;HOB elevated     General bed mobility comments: Min assist for LLE lifting, trunk elevation, increased time and effort.  Transfers Overall transfer level: Needs assistance Equipment used: Rolling walker (2 wheeled) Transfers: Sit to/from Stand Sit to Stand: From elevated surface;Min assist         General transfer comment: Min assist for power up, steadying upon standing. Verbal cuing for hand placement when rising.  Ambulation/Gait Ambulation/Gait assistance: Min guard Gait Distance  (Feet): 15 Feet Assistive device: Rolling walker (2 wheeled) Gait Pattern/deviations: Step-to pattern;Decreased step length - right;Decreased step length - left;Antalgic;Decreased weight shift to left;Decreased stance time - left Gait velocity: decr   General Gait Details: min guard for safety, verbal cuing for sequencing, placement in RW, turning with RW.  Stairs            Wheelchair Mobility    Modified Rankin (Stroke Patients Only)       Balance Overall balance assessment: Mild deficits observed, not formally tested                                           Pertinent Vitals/Pain Pain Assessment: 0-10 Pain Score: 4  Pain Location: L knee Pain Descriptors / Indicators: Sore Pain Intervention(s): Limited activity within patient's tolerance;Monitored during session;Premedicated before session;Repositioned;Ice applied    Home Living Family/patient expects to be discharged to:: Private residence Living Arrangements: Spouse/significant other;Children(teenager) Available Help at Discharge: Family;Available 24 hours/day Type of Home: House Home Access: Stairs to enter   [employer] of Steps: 8 in the garage, 10 through the front door Home Layout: One level Home Equipment: [employer]glis - 2 wheels;Cane - single point      Prior Function Level of Independence: Independent               Hand Dominance   Dominant Hand: Right    Extremity/Trunk Assessment   Upper Extremity Assessment Upper Extremity Assessment: Overall WFL for tasks assessed    Lower Extremity Assessment Lower Extremity Assessment:  Overall WFL for tasks assessed;LLE deficits/detail LLE Deficits / Details: suspected post-surgical LLE weakness; able to perform ankle pumps, heel slide to 40*, unable to perform SLR without significant quad lag LLE Sensation: WNL    Cervical / Trunk Assessment Cervical / Trunk Assessment: Normal  Communication   Communication: No  difficulties  Cognition Arousal/Alertness: Awake/alert Behavior During Therapy: WFL for tasks assessed/performed Overall Cognitive Status: Within Functional Limits for tasks assessed                                        General Comments      Exercises     Assessment/Plan    PT Assessment Patient needs continued PT services  PT Problem List Decreased strength;Decreased mobility;Decreased range of motion;Decreased activity tolerance;Decreased balance;Decreased knowledge of use of DME;Pain       PT Treatment Interventions DME instruction;Therapeutic activities;Gait training;Therapeutic exercise;Patient/family education;Balance training;Stair training;Functional mobility training    PT Goals (Current goals can be found in the Care Plan section)  Acute Rehab PT Goals Patient Stated Goal: go home PT Goal Formulation: With patient Time For Goal Achievement: 12/24/18 Potential to Achieve Goals: Good    Frequency 7X/week   Barriers to discharge        Co-evaluation               AM-PAC PT "6 Clicks" Mobility  Outcome Measure Help needed turning from your back to your side while in a flat bed without using bedrails?: A Little Help needed moving from lying on your back to sitting on the side of a flat bed without using bedrails?: A Little Help needed moving to and from a bed to a chair (including a wheelchair)?: A Little Help needed standing up from a chair using your arms (e.g., wheelchair or bedside chair)?: A Little Help needed to walk in hospital room?: A Little Help needed climbing 3-5 steps with a railing? : A Lot 6 Click Score: 17    End of Session Equipment Utilized During Treatment: Gait belt;Left knee immobilizer Activity Tolerance: Patient tolerated treatment well;Patient limited by pain Patient left: in chair;with chair alarm set;with call bell/phone within reach;with SCD's reapplied Nurse Communication: Mobility status PT Visit Diagnosis:  Difficulty in walking, not elsewhere classified (R26.2);Other abnormalities of gait and mobility (R26.89)    Time: 4854-6270 PT Time Calculation (min) (ACUTE ONLY): 28 min   Charges:   PT Evaluation $PT Eval Low Complexity: 1 Low PT Treatments $Gait Training: 8-22 mins       Nicola Police, PT Acute Rehabilitation Services Pager [phone]  Office [phone]   Tyrone Apple D Despina Hidden 12/17/2018, 6:13 PM

## 2018-12-17 NOTE — H&P (Signed)
TOTAL KNEE ADMISSION H&P  Patient is being admitted for left total knee arthroplasty.  Subjective:  Chief Complaint:left knee pain.  HPI: Donna Lopez, 53 y.o. female, has a history of pain and functional disability in the left knee due to arthritis and has failed non-surgical conservative treatments for greater than 12 weeks to includeNSAID's and/or analgesics, corticosteriod injections, viscosupplementation injections, flexibility and strengthening excercises, use of assistive devices, weight reduction as appropriate and activity modification.  Onset of symptoms was gradual, starting 5 years ago with gradually worsening course since that time. The patient noted no past surgery on the left knee(s).  Patient currently rates pain in the left knee(s) at 10 out of 10 with activity. Patient has night pain, worsening of pain with activity and weight bearing, pain that interferes with activities of daily living, pain with passive range of motion, crepitus and joint swelling.  Patient has evidence of subchondral sclerosis, periarticular osteophytes and joint space narrowing by imaging studies. There is no active infection.  Patient Active Problem List   Diagnosis Date Noted  . Unilateral primary osteoarthritis, left knee 12/17/2018  . Primary osteoarthritis of left knee 11/23/2018  . Carpal tunnel syndrome, left upper limb 11/23/2018  . BMI 40.0-44.9, adult (HCC) 07/12/2018  . Primary osteoarthritis of both knees 12/24/2016  . 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 DDD (degenerative disc disease), lumbar 11/17/2016  . Vitamin D deficiency 11/17/2016  . History of rosacea/ History of acne 11/17/2016  . High risk medications (not anticoagulants) long-term use 06/25/2016  . History of degenerative disc disease 06/25/2016  . Severe obesity (BMI >= 40) (HCC) 06/13/2014  . Left-sided thoracic back pain 07/19/2013  . PALPITATIONS 10/17/2009  . UTI 05/24/2009  . Rheumatoid arthritis  (HCC) 05/24/2009  . Hair loss 02/22/2009  . VAGINAL BLEEDING 10/05/2008  . Carrier of group B Streptococcus 07/28/2007  . GERD 12/28/2006  . CARDIAC MURMUR, HX OF 12/28/2006  . HIATAL HERNIA, HX OF 12/28/2006   Past Medical History:  Diagnosis Date  . Allergy   . Arthritis   . Carpal tunnel syndrome 10/2018  . DDD (degenerative disc disease), lumbar   . DDD (degenerative disc disease), thoracic   . Family history of adverse reaction to anesthesia    Mothers sister never woke up from surgery  . GERD (gastroesophageal reflux disease)   . Grade I diastolic dysfunction 2013  . Heart murmur   . Heart palpitations    History of  . History of hiatal hernia   . OA (osteoarthritis)   . Obese   . Rheumatoid arthritis (HCC)   . 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 Vitamin D deficiency     Past Surgical History:  Procedure Laterality Date  . COLONOSCOPY  2014   DB- normal   . NO PAST SURGERIES    . UPPER GASTROINTESTINAL ENDOSCOPY      Current Facility-Administered Medications  Medication Dose Route Frequency Provider Last Rate Last Dose  . chlorhexidine (HIBICLENS) 4 % liquid 4 application  60 mL Topical Once Richardean Canal W, PA-C      . clindamycin (CLEOCIN) IVPB 900 mg  900 mg Intravenous On Call to OR Kirtland Bouchard, PA-C      . lactated ringers infusion   Intravenous Continuous Kipp Brood, MD 50 mL/hr at 12/17/18 0654    . tranexamic acid (CYKLOKAPRON) IVPB 1,000 mg  1,000 mg Intravenous To OR Kirtland Bouchard, PA-C       Allergies  Allergen Reactions  .  Penicillins Hives    Did it involve swelling of the face/tongue/throat, SOB, or low BP? No Did it involve sudden or severe rash/hives, skin peeling, or any reaction on the inside of your mouth or nose? No Did you need to seek medical attention at a hospital or doctor's office? Yes When did it last happen?15 -20 years ago If all above answers are "NO", may proceed with cephalosporin use.     Social History   Tobacco  Use  . Smoking status: Never Smoker  . Smokeless tobacco: Never Used  Substance Use Topics  . Alcohol use: Yes    Comment: rare alcohol use-once/twice a year    Family History  Problem Relation Age of Onset  . Uterine cancer Mother   . Diabetes Father   . Kidney cancer Father   . Colon cancer Sister 64  . Rectal cancer Sister   . Colon cancer Paternal Grandfather   . Colon polyps Brother   . Cancer Sister        breast   . Healthy Daughter   . Esophageal cancer Neg Hx   . Stomach cancer Neg Hx      Review of Systems  Musculoskeletal: Positive for joint pain.  All other systems reviewed and are negative.   Objective:  Physical Exam  Constitutional: She is oriented to person, place, and time. She appears well-developed and well-nourished.  HENT:  Head: Normocephalic and atraumatic.  Eyes: Pupils are equal, round, and reactive to light. EOM are normal.  Neck: Normal range of motion. Neck supple.  Cardiovascular: Normal rate and regular rhythm.  Respiratory: Effort normal and breath sounds normal.  GI: Soft. Bowel sounds are normal.  Musculoskeletal:     Left knee: She exhibits decreased range of motion, swelling, effusion, abnormal alignment and abnormal meniscus. Tenderness found. Medial joint line and lateral joint line tenderness noted.  Neurological: She is alert and oriented to person, place, and time.  Skin: Skin is warm.  Psychiatric: She has a normal mood and affect.    Vital signs in last 24 hours: Temp:  [98.1 F (36.7 C)-98.2 F (36.8 C)] 98.1 F (36.7 C) (07/31 0636) Pulse Rate:  [98-105] 105 (07/31 0636) Resp:  [16-20] 20 (07/31 0636) BP: (133-138)/(86-92) 133/86 (07/31 0636) SpO2:  [98 %-99 %] 98 % (07/31 0636) Weight:  [100.7 kg] 100.7 kg (07/30 0826)  Labs:   Estimated body mass index is 39.33 kg/m as calculated from the following:   Height as of 12/16/18: 5\' 3"  (1.6 m).   Weight as of 12/16/18: 100.7 kg.   Imaging Review Plain radiographs  demonstrate severe degenerative joint disease of the right knee(s). The overall alignment ismild varus. The bone quality appears to be good for age and reported activity level.      Assessment/Plan:  End stage arthritis, right knee   The patient history, physical examination, clinical judgment of the provider and imaging studies are consistent with end stage degenerative joint disease of the right knee(s) and total knee arthroplasty is deemed medically necessary. The treatment options including medical management, injection therapy arthroscopy and arthroplasty were discussed at length. The risks and benefits of total knee arthroplasty were presented and reviewed. The risks due to aseptic loosening, infection, stiffness, patella tracking problems, thromboembolic complications and other imponderables were discussed. The patient acknowledged the explanation, agreed to proceed with the plan and consent was signed. Patient is being admitted for inpatient treatment for surgery, pain control, PT, OT, prophylactic antibiotics, VTE prophylaxis, progressive ambulation  and ADL's and discharge planning. The patient is planning to be discharged home with home health services     Patient's anticipated LOS is less than 2 midnights, meeting these requirements: - Younger than 40 - Lives within 1 hour of care - Has a competent adult at home to recover with post-op recover - NO history of  - Chronic pain requiring opiods  - Diabetes  - Coronary Artery Disease  - Heart failure  - Heart attack  - Stroke  - DVT/VTE  - Cardiac arrhythmia  - Respiratory Failure/COPD  - Renal failure  - Anemia  - Advanced Liver disease

## 2018-12-17 NOTE — Anesthesia Postprocedure Evaluation (Signed)
Anesthesia Post Note  Patient: Donna Lopez  Procedure(s) Performed: LEFT TOTAL KNEE ARTHROPLASTY (Left )     Patient location during evaluation: PACU Anesthesia Type: Spinal Level of consciousness: oriented and awake and alert Pain management: pain level controlled Vital Signs Assessment: post-procedure vital signs reviewed and stable Respiratory status: spontaneous breathing, respiratory function stable and patient connected to nasal cannula oxygen Cardiovascular status: blood pressure returned to baseline and stable Postop Assessment: no headache, no backache and no apparent nausea or vomiting Anesthetic complications: no    Last Vitals:  Vitals:   12/17/18 1420 12/17/18 1538  BP: (!) 144/82 133/85  Pulse: 86 99  Resp: 15 17  Temp: 36.7 C 36.7 C  SpO2: 97% 96%    Last Pain:  Vitals:   12/17/18 1511  TempSrc:   PainSc: 7                  Donna Lopez

## 2018-12-17 NOTE — Transfer of Care (Signed)
Immediate Anesthesia Transfer of Care Note  Patient: Donna Lopez  Procedure(s) Performed: LEFT TOTAL KNEE ARTHROPLASTY (Left )  Patient Location: PACU  Anesthesia Type:Spinal  Level of Consciousness: awake, alert  and oriented  Airway & Oxygen Therapy: Patient Spontanous Breathing and Patient connected to face mask oxygen  Post-op Assessment: Report given to RN and Post -op Vital signs reviewed and stable  Post vital signs: Reviewed and stable  Last Vitals:  Vitals Value Taken Time  BP    Temp    Pulse    Resp 14 12/17/18 1109  SpO2    Vitals shown include unvalidated device data.  Last Pain:  Vitals:   12/17/18 0636  TempSrc: Oral         Complications: No apparent anesthesia complications

## 2018-12-17 NOTE — Brief Op Note (Signed)
12/17/2018  10:41 AM  PATIENT:  Donna Lopez  53 y.o. female  PRE-OPERATIVE DIAGNOSIS:  OSTEOARTHRITIS LEFT KNEE  POST-OPERATIVE DIAGNOSIS:  OSTEOARTHRITIS LEFT KNEE  PROCEDURE:  Procedure(s): LEFT TOTAL KNEE ARTHROPLASTY (Left)  SURGEON:  Surgeon(s) and Role:    Mcarthur Rossetti, MD - Primary  PHYSICIAN ASSISTANT: [name], PA-C  ANESTHESIA:   regional and spinal  EBL:  50 mL   COUNTS:  YES  TOURNIQUET:   Total Tourniquet Time Documented: Thigh (Left) - 51 minutes Total: Thigh (Left) - 51 minutes   DICTATION: .Other Dictation: Dictation Number [id number]  PLAN OF CARE: Admit to inpatient   PATIENT DISPOSITION:  PACU - hemodynamically stable.   Delay start of Pharmacological VTE agent (>24hrs) due to surgical blood loss or risk of bleeding: no

## 2018-12-17 NOTE — Progress Notes (Signed)
AssistedDr. Joslin with left, ultrasound guided, adductor canal block. Side rails up, monitors on throughout procedure. See vital signs in flow sheet. Tolerated Procedure well.  

## 2018-12-17 NOTE — Anesthesia Preprocedure Evaluation (Addendum)
Anesthesia Evaluation  Patient identified by MRN, date of birth, ID band Patient awake    Reviewed: Allergy & Precautions, NPO status , Patient's Chart, lab work & pertinent test results  Airway Mallampati: II  TM Distance: >3 FB Neck ROM: Full    Dental  (+) Teeth Intact, Dental Advisory Given   Pulmonary    breath sounds clear to auscultation       Cardiovascular  Rhythm:Regular Rate:Normal     Neuro/Psych    GI/Hepatic   Endo/Other    Renal/GU      Musculoskeletal   Abdominal (+) + obese,   Peds  Hematology   Anesthesia Other Findings   Reproductive/Obstetrics                             Anesthesia Physical Anesthesia Plan  ASA: III  Anesthesia Plan: Spinal   Post-op Pain Management:  Regional for Post-op pain   Induction: Intravenous  PONV Risk Score and Plan: Ondansetron and Dexamethasone  Airway Management Planned: Simple Face Mask and Natural Airway  Additional Equipment:   Intra-op Plan:   Post-operative Plan:   Informed Consent: I have reviewed the patients History and Physical, chart, labs and discussed the procedure including the risks, benefits and alternatives for the proposed anesthesia with the patient or authorized representative who has indicated his/her understanding and acceptance.     Dental advisory given  Plan Discussed with: CRNA and Anesthesiologist  Anesthesia Plan Comments:         Anesthesia Quick Evaluation

## 2018-12-17 NOTE — Anesthesia Procedure Notes (Addendum)
Anesthesia Regional Block: Adductor canal block   Pre-Anesthetic Checklist: ,, timeout performed, Correct Patient, Correct Site, Correct Laterality, Correct Procedure, Correct Position, site marked, Risks and benefits discussed, pre-op evaluation,  At surgeon's request and post-op pain management  Laterality: Left  Prep: Maximum Sterile Barrier Precautions used, chloraprep       Needles:  Injection technique: Single-shot  Needle Type: Echogenic Stimulator Needle     Needle Length: 9cm  Needle Gauge: 21     Additional Needles:   Procedures:,,,, ultrasound used (permanent image in chart),,,,  Narrative:  Start time: 12/17/2018 7:55 AM End time: 12/17/2018 8:05 AM Injection made incrementally with aspirations every 5 mL.  Performed by: Personally  Anesthesiologist: Roberts Gaudy, MD  Additional Notes: 20 cc 0.75% Ropivacaine injected easily

## 2018-12-17 NOTE — Anesthesia Procedure Notes (Signed)
Spinal  Patient location during procedure: OR Start time: 12/17/2018 9:10 AM End time: 12/17/2018 9:16 AM Staffing Anesthesiologist: Roberts Gaudy, MD Performed: anesthesiologist  Preanesthetic Checklist Completed: patient identified, site marked, surgical consent, pre-op evaluation, timeout performed, IV checked, risks and benefits discussed and monitors and equipment checked Spinal Block Patient position: sitting Prep: DuraPrep Patient monitoring: heart rate, cardiac monitor, continuous pulse ox and blood pressure Approach: midline Location: L3-4 Injection technique: single-shot Needle Needle type: Sprotte  Needle gauge: 22 G Needle length: 9 cm Assessment Sensory level: T4 Additional Notes 1.6 mg 0.75% Bupivacaine injected easily

## 2018-12-18 DIAGNOSIS — K219 Gastro-esophageal reflux disease without esophagitis: Secondary | ICD-10-CM | POA: Diagnosis not present

## 2018-12-18 DIAGNOSIS — Z7982 Long term (current) use of aspirin: Secondary | ICD-10-CM | POA: Diagnosis not present

## 2018-12-18 DIAGNOSIS — M069 Rheumatoid arthritis, unspecified: Secondary | ICD-10-CM | POA: Diagnosis not present

## 2018-12-18 DIAGNOSIS — E559 Vitamin D deficiency, unspecified: Secondary | ICD-10-CM | POA: Diagnosis not present

## 2018-12-18 DIAGNOSIS — M1712 Unilateral primary osteoarthritis, left knee: Secondary | ICD-10-CM | POA: Diagnosis not present

## 2018-12-18 DIAGNOSIS — Z6839 Body mass index (BMI) 39.0-39.9, adult: Secondary | ICD-10-CM | POA: Diagnosis not present

## 2018-12-18 DIAGNOSIS — Z79899 Other long term (current) drug therapy: Secondary | ICD-10-CM | POA: Diagnosis not present

## 2018-12-18 LAB — BASIC METABOLIC PANEL
Anion gap: 9 (ref 5–15)
BUN: 9 mg/dL (ref 6–20)
CO2: 25 mmol/L (ref 22–32)
Calcium: 8.6 mg/dL — ABNORMAL LOW (ref 8.9–10.3)
Chloride: 100 mmol/L (ref 98–111)
Creatinine, Ser: 0.45 mg/dL (ref 0.44–1.00)
GFR calc Af Amer: >60 mL/min (ref >60)
GFR calc non Af Amer: >60 mL/min (ref >60)
Glucose, Bld: 128 mg/dL — ABNORMAL HIGH (ref 70–99)
Potassium: 3.7 mmol/L (ref 3.5–5.1)
Sodium: 134 mmol/L — ABNORMAL LOW (ref 135–145)

## 2018-12-18 LAB — CBC
HCT: 37.8 % (ref 36.0–46.0)
Hemoglobin: 11.9 g/dL — ABNORMAL LOW (ref 12.0–15.0)
MCH: 29.2 pg (ref 26.0–34.0)
MCHC: 31.5 g/dL (ref 30.0–36.0)
MCV: 92.9 fL (ref 80.0–100.0)
Platelets: 247 10*3/uL (ref 150–400)
RBC: 4.07 MIL/uL (ref 3.87–5.11)
RDW: 15.7 % — ABNORMAL HIGH (ref 11.5–15.5)
WBC: 14.4 10*3/uL — ABNORMAL HIGH (ref 4.0–10.5)
nRBC: 0 % (ref 0.0–0.2)

## 2018-12-18 MED ORDER — OXYCODONE HCL 5 MG PO TABS: 5.0000 mg | ORAL_TABLET | ORAL | 0 refills | Status: DC | PRN

## 2018-12-18 MED ORDER — METHOCARBAMOL 500 MG PO TABS: 500.0000 mg | ORAL_TABLET | Freq: Four times a day (QID) | ORAL | 0 refills | Status: DC | PRN

## 2018-12-18 MED ORDER — KETOROLAC TROMETHAMINE 15 MG/ML IJ SOLN
15.0000 mg | Freq: Four times a day (QID) | INTRAMUSCULAR | Status: AC
  Administered 2018-12-18 (×3): 15 mg via INTRAVENOUS
  Filled 2018-12-18 (×3): qty 1

## 2018-12-18 MED ORDER — ASPIRIN 81 MG PO CHEW: 81.0000 mg | CHEWABLE_TABLET | Freq: Two times a day (BID) | ORAL | 0 refills | Status: DC

## 2018-12-18 NOTE — TOC Initial Note (Signed)
Transition of Care [facility]) - Initial/Assessment Note    Patient Details  Name: Donna Lopez MRN: [id number] Date of Birth: [birth year]  Transition of Care (TOC) CM/SW Contact:    Armanda Heritage, RN Phone Number: 12/18/2018, 11:15 AM  Clinical Narrative:  CM spoke with patient at bedside. [facility] with [facility] for HHPT. [employer] to deliver 3-in-1 to bedside. Patient states she has rolling walker at home.                   Expected Discharge Plan: Home w Home Health Services     Patient Goals and CMS Choice Patient states their goals for this hospitalization and ongoing recovery are:: to go home CMS Medicare.gov Compare Post Acute Care list provided to:: Patient Choice offered to / list presented to : Patient  Expected Discharge Plan and Services Expected Discharge Plan: Home w Home Health Services   Discharge Planning Services: CM Consult Post Acute Care Choice: Home Health Living arrangements for the past 2 months: Single Family Home                 DME Arranged: 3-N-1 DME Agency: [employer] Date DME Agency Contacted: 12/18/18 Time DME Agency Contacted: [phone] Representative spoke with at DME Agency: [place] HH Arranged: PT HH Agency: [facility] (formerly [facility]) Date HH Agency Contacted: 12/18/18 Time HH Agency Contacted: [phone] Representative spoke with at [facility] Agency: pre arranged  Prior Living Arrangements/Services Living arrangements for the past 2 months: Single Family Home Lives with:: Spouse, Minor Children Patient language and need for interpreter reviewed:: Yes Do you feel safe going back to the place where you live?: Yes      Need for Family Participation in Patient Care: Yes (Comment) Care giver support system in place?: Yes (comment)   Criminal Activity/Legal Involvement Pertinent to Current Situation/Hospitalization: No - Comment as needed  Activities of Daily Living Home Assistive Devices/Equipment: Cane (specify quad or  straight), Eyeglasses, Walker (specify type), Raised toilet seat with rails ADL Screening (condition at time of admission) Patient's cognitive ability adequate to safely complete daily activities?: Yes Is the patient deaf or have difficulty hearing?: No Does the patient have difficulty seeing, even when wearing glasses/contacts?: No Does the patient have difficulty concentrating, remembering, or making decisions?: No Patient able to express need for assistance with ADLs?: Yes Does the patient have difficulty dressing or bathing?: No Independently performs ADLs?: Yes (appropriate for developmental age) Does the patient have difficulty walking or climbing stairs?: No Weakness of Legs: None Weakness of Arms/Hands: None  Permission Sought/Granted                  Emotional Assessment Appearance:: Appears stated age Attitude/Demeanor/Rapport: Engaged Affect (typically observed): Accepting Orientation: : Oriented to Place, Oriented to  Time, Oriented to Situation, Oriented to Self   Psych Involvement: No (comment)  Admission diagnosis:  OSTEOARTHRITIS LEFT KNEE Patient Active Problem List   Diagnosis Date Noted  . Unilateral primary osteoarthritis, left knee 12/17/2018  . Status post total left knee replacement 12/17/2018  . Primary osteoarthritis of left knee 11/23/2018  . Carpal tunnel syndrome, left upper limb 11/23/2018  . BMI 40.0-44.9, adult (HCC) 07/12/2018  . Primary osteoarthritis of both knees 12/24/2016  . 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 DDD (degenerative disc disease), lumbar 11/17/2016  . Vitamin D deficiency 11/17/2016  . History of rosacea/ History of acne 11/17/2016  . High risk medications (not anticoagulants) long-term use 06/25/2016  .  History of degenerative disc disease 06/25/2016  . Severe obesity (BMI >= 40) ([place]) 06/13/2014  . Left-sided thoracic back pain 07/19/2013  . PALPITATIONS 10/17/2009  . UTI 05/24/2009  . Rheumatoid  arthritis ([place]) 05/24/2009  . Hair loss 02/22/2009  . VAGINAL BLEEDING 10/05/2008  . Carrier of group B Streptococcus 07/28/2007  . GERD 12/28/2006  . CARDIAC MURMUR, HX OF 12/28/2006  . HIATAL HERNIA, HX OF 12/28/2006   PCP:  [name], DO Pharmacy:   [id number] [place], [place] [address] Phone: [phone] Fax: [phone]  [facility] [place], [place] [address] [place] [address] Phone: [phone] Fax: [place], [place] [facility] [address] [place] [address] Phone: [phone] Fax: [phone]     Social Determinants of Health (SDOH) Interventions    Readmission Risk Interventions No flowsheet data found.

## 2018-12-18 NOTE — Plan of Care (Signed)
  Problem: Education: Goal: Knowledge of General Education information will improve Description: Including pain rating scale, medication(s)/side effects and non-pharmacologic comfort measures Outcome: Progressing   Problem: Activity: Goal: Risk for activity intolerance will decrease Outcome: Progressing   Problem: Nutrition: Goal: Adequate nutrition will be maintained Outcome: Progressing   Problem: Elimination: Goal: Will not experience complications related to bowel motility Outcome: Progressing   Problem: Pain Managment: Goal: General experience of comfort will improve Outcome: Progressing   Problem: Education: Goal: Individualized Educational Video(s) Outcome: Progressing

## 2018-12-18 NOTE — Progress Notes (Signed)
Subjective: 1 Day Post-Op Procedure(s) (LRB): LEFT TOTAL KNEE ARTHROPLASTY (Left) Patient reports pain as moderate.  Some lightheadedness.  Has only taken one or two steps out of bed.  Objective: Vital signs in last 24 hours: Temp:  [97.4 F (36.3 C)-98 F (36.7 C)] 98 F (36.7 C) (08/01 0509) Pulse Rate:  [67-105] 100 (08/01 0509) Resp:  [9-21] 16 (08/01 0509) BP: (118-152)/(72-94) 143/72 (08/01 0509) SpO2:  [88 %-100 %] 99 % (08/01 0509) Weight:  [100.7 kg] 100.7 kg (07/31 1420)  Intake/Output from previous day: 07/31 0701 - 08/01 0700 In: 2582 [P.O.:300; I.V.:2032; IV Piggyback:250] Out: 4000 [Urine:3950; Blood:50] Intake/Output this shift: No intake/output data recorded.  Recent Labs    12/16/18 0846 12/18/18 0345  HGB 13.2 11.9*   Recent Labs    12/16/18 0846 12/18/18 0345  WBC 9.2 14.4*  RBC 4.31 4.07  HCT 40.1 37.8  PLT 270 247   Recent Labs    12/18/18 0345  NA 134*  K 3.7  CL 100  CO2 25  BUN 9  CREATININE 0.45  GLUCOSE 128*  CALCIUM 8.6*   No results for input(s): LABPT, INR in the last 72 hours.  Sensation intact distally Intact pulses distally Dorsiflexion/Plantar flexion intact Incision: scant drainage No cellulitis present Compartment soft   Assessment/Plan: 1 Day Post-Op Procedure(s) (LRB): LEFT TOTAL KNEE ARTHROPLASTY (Left) Up with therapy Plan for discharge tomorrow Discharge home with home health Definitely not safe for discharge to home today from a mobility and fall risk standpoint.     Mcarthur Rossetti 12/18/2018, 7:54 AM

## 2018-12-18 NOTE — Progress Notes (Signed)
Physical Therapy Treatment Patient Details Name: Donna Lopez MRN: [id number] DOB: 01-07-1966 Today's Date: 12/18/2018    History of Present Illness 53 yo female s/p L TKR on 12/17/18. PMH includes DJD, RA, obesity.    PT Comments    Pt motivated and progressing well with PT - this am, pt up to bathroom, ambulating increased distance in hall and initiated therex program.   Follow Up Recommendations  Follow surgeon's recommendation for DC plan and follow-up therapies;Supervision for mobility/OOB     Equipment Recommendations  None recommended by PT    Recommendations for Other Services       Precautions / Restrictions Precautions Precautions: Fall Required Braces or Orthoses: Knee Immobilizer - Left Knee Immobilizer - Left: On when out of bed or walking;Discontinue once straight leg raise with < 10 degree lag Restrictions Weight Bearing Restrictions: No Other Position/Activity Restrictions: WBAT    Mobility  Bed Mobility Overal bed mobility: Needs Assistance Bed Mobility: Supine to Sit     Supine to sit: Min assist;HOB elevated     General bed mobility comments: Cues for sequence and use of R LE to self assist;  Physical assist to manage L LE  Transfers Overall transfer level: Needs assistance Equipment used: Rolling walker (2 wheeled) Transfers: Sit to/from Stand Sit to Stand: From elevated surface;Min assist         General transfer comment: Min assist for power up, steadying upon standing. Verbal cuing for hand placement when rising.  Ambulation/Gait Ambulation/Gait assistance: Min guard Gait Distance (Feet): 64 Feet(and 15' into bathroom) Assistive device: Rolling walker (2 wheeled) Gait Pattern/deviations: Step-to pattern;Decreased step length - right;Decreased step length - left;Antalgic;Decreased weight shift to left;Decreased stance time - left Gait velocity: decr   General Gait Details: min guard for safety, verbal cuing for sequencing, placement  in RW, turning with RW.   Stairs             Wheelchair Mobility    Modified Rankin (Stroke Patients Only)       Balance Overall balance assessment: Mild deficits observed, not formally tested                                          Cognition Arousal/Alertness: Awake/alert Behavior During Therapy: WFL for tasks assessed/performed Overall Cognitive Status: Within Functional Limits for tasks assessed                                        Exercises Total Joint Exercises Ankle Circles/Pumps: AROM;Both;15 reps;Supine Quad Sets: AROM;Both;10 reps;Supine Heel Slides: AAROM;Left;15 reps;Supine Straight Leg Raises: AROM;Left;10 reps;Supine Goniometric ROM: AAROM L knee - 10 - 35    General Comments        Pertinent Vitals/Pain Pain Assessment: 0-10 Pain Score: 5  Pain Location: L knee Pain Descriptors / Indicators: Sore Pain Intervention(s): Limited activity within patient's tolerance;Monitored during session;Premedicated before session;Ice applied    Home Living                      Prior Function            PT Goals (current goals can now be found in the care plan section) Acute Rehab PT Goals Patient Stated Goal: go home PT Goal Formulation: With patient Time For Goal Achievement: 12/24/18  Potential to Achieve Goals: Good Progress towards PT goals: Progressing toward goals    Frequency    7X/week      PT Plan Current plan remains appropriate    Co-evaluation              AM-PAC PT "6 Clicks" Mobility   Outcome Measure  Help needed turning from your back to your side while in a flat bed without using bedrails?: A Little Help needed moving from lying on your back to sitting on the side of a flat bed without using bedrails?: A Little Help needed moving to and from a bed to a chair (including a wheelchair)?: A Little Help needed standing up from a chair using your arms (e.g., wheelchair or bedside  chair)?: A Little Help needed to walk in hospital room?: A Little Help needed climbing 3-5 steps with a railing? : A Lot 6 Click Score: 17    End of Session Equipment Utilized During Treatment: Gait belt;Left knee immobilizer Activity Tolerance: Patient tolerated treatment well Patient left: in chair;with chair alarm set;with call bell/phone within reach Nurse Communication: Mobility status PT Visit Diagnosis: Difficulty in walking, not elsewhere classified (R26.2);Other abnormalities of gait and mobility (R26.89)     Time: 4585-9292 PT Time Calculation (min) (ACUTE ONLY): 45 min  Charges:  $Gait Training: 8-22 mins $Therapeutic Exercise: 8-22 mins $Therapeutic Activity: 8-22 mins                     [place] Pager [phone] Office [phone]    Felicita Nuncio 12/18/2018, 4:04 PM

## 2018-12-18 NOTE — Discharge Instructions (Signed)

## 2018-12-18 NOTE — Plan of Care (Signed)
  Problem: Pain Managment: Goal: General experience of comfort will improve Outcome: Progressing   Problem: Safety: Goal: Ability to remain free from injury will improve Outcome: Progressing   Problem: Activity: Goal: Ability to avoid complications of mobility impairment will improve Outcome: Progressing

## 2018-12-18 NOTE — Progress Notes (Signed)
    Home health agencies that serve [phone].        [place] Quality of Patient Care Rating Patient Survey Summary Rating  [facility] [phone] 3 out of 5 stars 5 out of [place] [phone] 3 out of 5 stars 4 out of [place] [phone] 4  out of 5 stars 3 out of [place] [phone] 4 out of 5 stars 4 out of [place] [phone] 4  out of 5 stars 4 out of [place] [phone] 4 out of 5 stars 4 out of [place] [phone] 3 out of 5 stars 3 out of 5 stars  [facility] [phone] 3  out of 5 stars 4 out of [place] [phone] 3 out of 5 stars 4 out of 5 stars  [facility] (336) [phone] 3  out of 5 stars 3 out of [place] [phone] 4 out of 5 stars 5 out of [place] [phone] 3  out of 5 stars 4 out of [place] [phone]) [phone] 3 out of 5 stars 4 out of [place] [phone] 2  out of 5 stars 4 out of [place] [phone] Not Available4 Not [facility] [phone] 2  out of 5 stars 4 out of [place] [phone] 4  out of 5 stars 3 out of [place] [phone] 4  out of 5 stars 2 out of [place] number Footnote as displayed on [place]  1 This agency provides services under a federal waiver program to non-traditional, chronic long term population.  2 This agency provides services to a special needs population.  3 Not Available.  4 The number of patient episodes for this measure is too small to  report.  5 This measure currently does not have data or provider has been certified/recertified for less than 6 months.  6 The national average for this measure is not provided because of state-to-state differences in data collection.  7 Medicare is not displaying rates for this measure for any home health agency, because of an issue with the data.  8 There were problems with the data and they are being corrected.  9 Zero, or very few, patients met the survey's rules for inclusion. The scores shown, if any, reflect a very small number of surveys and may not accurately tell how an agency is doing.  10 Survey results are based on less than 12 months of data.  11 Fewer than 70 patients completed the survey. Use the scores shown, if any, with caution as the number of surveys may be too low to accurately tell how an agency is doing.  12 No survey results are available for this period.  13 Data suppressed by CMS for one or more quarters.

## 2018-12-18 NOTE — Progress Notes (Signed)
Physical Therapy Treatment Patient Details Name: Donna Lopez MRN: [id number] DOB: 1965/07/15 Today's Date: 12/18/2018    History of Present Illness 53 yo female s/p L TKR on 12/17/18. PMH includes DJD, RA, obesity.    PT Comments    Pt progressing steadily with mobility and hopeful for dc home tomorrow.  Follow Up Recommendations  Follow surgeon's recommendation for DC plan and follow-up therapies;Supervision for mobility/OOB     Equipment Recommendations  None recommended by PT    Recommendations for Other Services       Precautions / Restrictions Precautions Precautions: Fall Required Braces or Orthoses: Knee Immobilizer - Left Knee Immobilizer - Left: On when out of bed or walking;Discontinue once straight leg raise with < 10 degree lag Restrictions Weight Bearing Restrictions: No Other Position/Activity Restrictions: WBAT    Mobility  Bed Mobility Overal bed mobility: Needs Assistance Bed Mobility: Sit to Supine     Supine to sit: Min assist;HOB elevated Sit to supine: Min assist   General bed mobility comments: Cues for sequence and use of R LE to self assist;  Physical assist to manage L LE  Transfers Overall transfer level: Needs assistance Equipment used: Rolling walker (2 wheeled) Transfers: Sit to/from Stand Sit to Stand: Min guard;Min assist         General transfer comment: Min assist for power up, steadying upon standing. Verbal cuing for hand placement when rising.  Ambulation/Gait Ambulation/Gait assistance: Min guard Gait Distance (Feet): 100 Feet(and 15' into bathroom) Assistive device: Rolling walker (2 wheeled) Gait Pattern/deviations: Step-to pattern;Decreased step length - right;Decreased step length - left;Antalgic;Decreased weight shift to left;Decreased stance time - left Gait velocity: decr   General Gait Details: min guard for safety, verbal cuing for sequencing, placement in RW, turning with RW.   Stairs              Wheelchair Mobility    Modified Rankin (Stroke Patients Only)       Balance Overall balance assessment: Mild deficits observed, not formally tested                                          Cognition Arousal/Alertness: Awake/alert Behavior During Therapy: WFL for tasks assessed/performed Overall Cognitive Status: Within Functional Limits for tasks assessed                                        Exercises Total Joint Exercises Ankle Circles/Pumps: AROM;Both;15 reps;Supine Quad Sets: AROM;Both;10 reps;Supine Heel Slides: AAROM;Left;15 reps;Supine Straight Leg Raises: AROM;Left;10 reps;Supine Goniometric ROM: AAROM L knee - 10 - 35    General Comments        Pertinent Vitals/Pain Pain Assessment: 0-10 Pain Score: 5  Pain Location: L knee Pain Descriptors / Indicators: Sore Pain Intervention(s): Limited activity within patient's tolerance;Monitored during session;Premedicated before session;Ice applied    Home Living                      Prior Function            PT Goals (current goals can now be found in the care plan section) Acute Rehab PT Goals Patient Stated Goal: go home PT Goal Formulation: With patient Time For Goal Achievement: 12/24/18 Potential to Achieve Goals: Good Progress towards PT goals: Progressing toward  goals    Frequency    7X/week      PT Plan Current plan remains appropriate    Co-evaluation              AM-PAC PT "6 Clicks" Mobility   Outcome Measure  Help needed turning from your back to your side while in a flat bed without using bedrails?: A Little Help needed moving from lying on your back to sitting on the side of a flat bed without using bedrails?: A Little Help needed moving to and from a bed to a chair (including a wheelchair)?: A Little Help needed standing up from a chair using your arms (e.g., wheelchair or bedside chair)?: A Little Help needed to walk in  hospital room?: A Little Help needed climbing 3-5 steps with a railing? : A Lot 6 Click Score: 17    End of Session Equipment Utilized During Treatment: Gait belt;Left knee immobilizer Activity Tolerance: Patient tolerated treatment well Patient left: in bed;with call bell/phone within reach Nurse Communication: Mobility status PT Visit Diagnosis: Difficulty in walking, not elsewhere classified (R26.2);Other abnormalities of gait and mobility (R26.89)     Time: 1414-1500 PT Time Calculation (min) (ACUTE ONLY): 46 min  Charges:  $Gait Training: 8-22 mins $Therapeutic Exercise: 8-22 mins $Therapeutic Activity: 8-22 mins                     Mauro Kaufmann PT Acute Rehabilitation Services Pager [phone] Office [phone]    Yarixa Lightcap 12/18/2018, 4:08 PM

## 2018-12-19 DIAGNOSIS — Z6839 Body mass index (BMI) 39.0-39.9, adult: Secondary | ICD-10-CM | POA: Diagnosis not present

## 2018-12-19 DIAGNOSIS — Z7982 Long term (current) use of aspirin: Secondary | ICD-10-CM | POA: Diagnosis not present

## 2018-12-19 DIAGNOSIS — E559 Vitamin D deficiency, unspecified: Secondary | ICD-10-CM | POA: Diagnosis not present

## 2018-12-19 DIAGNOSIS — M1712 Unilateral primary osteoarthritis, left knee: Secondary | ICD-10-CM | POA: Diagnosis not present

## 2018-12-19 DIAGNOSIS — M069 Rheumatoid arthritis, unspecified: Secondary | ICD-10-CM | POA: Diagnosis not present

## 2018-12-19 DIAGNOSIS — Z79899 Other long term (current) drug therapy: Secondary | ICD-10-CM | POA: Diagnosis not present

## 2018-12-19 DIAGNOSIS — K219 Gastro-esophageal reflux disease without esophagitis: Secondary | ICD-10-CM | POA: Diagnosis not present

## 2018-12-19 NOTE — Progress Notes (Signed)
Patient discharged to home w/ family. Given all belongings, instructions. Verbalized understanding of instructions. Escorted to pov via w/c. 

## 2018-12-19 NOTE — Progress Notes (Signed)
Physical Therapy Treatment Patient Details Name: Donna Lopez MRN: [id number] DOB: [birth year] Today's Date: 12/19/2018    History of Present Illness 53 yo female s/p L TKR on 12/17/18. PMH includes DJD, RA, obesity.    PT Comments    Pt continues to progress steadily with mobility.  Pt ambulating increased distance in halls, reviewed don/doff KI, negotiated stairs, and performed home therex program with written instruction provided.  Pt eager for dc home.   Follow Up Recommendations  Follow surgeon's recommendation for DC plan and follow-up therapies;Supervision for mobility/OOB     Equipment Recommendations  None recommended by PT    Recommendations for Other Services       Precautions / Restrictions Precautions Precautions: Fall Required Braces or Orthoses: Knee Immobilizer - Left Knee Immobilizer - Left: On when out of bed or walking;Discontinue once straight leg raise with < 10 degree lag Restrictions Weight Bearing Restrictions: No LLE Weight Bearing: Weight bearing as tolerated Other Position/Activity Restrictions: WBAT    Mobility  Bed Mobility               General bed mobility comments: Pt up in chair and requests back to same  Transfers Overall transfer level: Needs assistance Equipment used: Rolling walker (2 wheeled) Transfers: Sit to/from Stand Sit to Stand: Min guard;Supervision         General transfer comment: min cues for use of UEs and LE management  Ambulation/Gait Ambulation/Gait assistance: Min guard;Supervision Gait Distance (Feet): 100 Feet Assistive device: Rolling walker (2 wheeled) Gait Pattern/deviations: Step-to pattern;Decreased step length - right;Decreased step length - left;Antalgic;Decreased weight shift to left;Decreased stance time - left Gait velocity: decr   General Gait Details: cues for posture and position from RW   Stairs Stairs: Yes Stairs assistance: Min assist Stair Management: One rail Right;Step to  pattern;Forwards;With crutches Number of Stairs: 5 General stair comments: min cues for sequence and foot/crutch placement   Wheelchair Mobility    Modified Rankin (Stroke Patients Only)       Balance Overall balance assessment: Mild deficits observed, not formally tested                                          Cognition Arousal/Alertness: Awake/alert Behavior During Therapy: WFL for tasks assessed/performed Overall Cognitive Status: Within Functional Limits for tasks assessed                                        Exercises Total Joint Exercises Ankle Circles/Pumps: AROM;Both;15 reps;Supine Quad Sets: AROM;Both;Supine;15 reps Heel Slides: AAROM;Left;Supine;20 reps Straight Leg Raises: AROM;Left;Supine;20 reps Goniometric ROM: AAROM at L knee - 8 - 40    General Comments        Pertinent Vitals/Pain Pain Assessment: 0-10 Pain Score: 4  Pain Location: L knee Pain Descriptors / Indicators: Sore Pain Intervention(s): Limited activity within patient's tolerance;Monitored during session;Premedicated before session;Ice applied    Home Living                      Prior Function            PT Goals (current goals can now be found in the care plan section) Acute Rehab PT Goals Patient Stated Goal: go home PT Goal Formulation: With patient Time For Goal Achievement: 12/24/18  Potential to Achieve Goals: Good Progress towards PT goals: Progressing toward goals    Frequency    7X/week      PT Plan Current plan remains appropriate    Co-evaluation              AM-PAC PT "6 Clicks" Mobility   Outcome Measure  Help needed turning from your back to your side while in a flat bed without using bedrails?: A Little Help needed moving from lying on your back to sitting on the side of a flat bed without using bedrails?: A Little Help needed moving to and from a bed to a chair (including a wheelchair)?: A Little Help  needed standing up from a chair using your arms (e.g., wheelchair or bedside chair)?: A Little Help needed to walk in hospital room?: A Little Help needed climbing 3-5 steps with a railing? : A Little 6 Click Score: 18    End of Session Equipment Utilized During Treatment: Gait belt;Left knee immobilizer Activity Tolerance: Patient tolerated treatment well Patient left: in chair;with call bell/phone within reach Nurse Communication: Mobility status PT Visit Diagnosis: Difficulty in walking, not elsewhere classified (R26.2);Other abnormalities of gait and mobility (R26.89)     Time: 2694-8546 PT Time Calculation (min) (ACUTE ONLY): 49 min  Charges:  $Gait Training: 8-22 mins $Therapeutic Exercise: 8-22 mins $Therapeutic Activity: 8-22 mins                     Mauro Kaufmann PT Acute Rehabilitation Services Pager [phone] Office [phone]    Yisroel Mullendore 12/19/2018, 12:23 PM

## 2018-12-19 NOTE — Progress Notes (Signed)
  Subjective: Donna Lopez is a 53 y.o. female s/p left TKA.  They are POD2.  Pt's pain is well-controlled.  Pt denies numbness/tingling/weakness.  Pt has ambulated with a  little difficulty but felt comfortable ambulating 20-30 feet out in the hall with PT yesterday.  Objective: Vital signs in last 24 hours: Temp:  [97.7 F (36.5 C)-98.6 F (37 C)] 97.7 F (36.5 C) (08/02 0615) Pulse Rate:  [100-113] 105 (08/02 0615) Resp:  [17-18] 17 (08/02 0615) BP: (126-136)/(72-85) 128/74 (08/02 0615) SpO2:  [90 %-98 %] 90 % (08/02 0615)  Intake/Output from previous day: 08/01 0701 - 08/02 0700 In: 1579.9 [P.O.:980; I.V.:599.9] Out: 4250 [Urine:4250] Intake/Output this shift: Total I/O In: 260 [P.O.:260] Out: 2000 [Urine:2000]  Exam:  No gross blood or drainage overlying the dressing 2+ DP pulse Sensation intact distally in the left foot Able to dorsiflex and plantarflex the left foot   Labs: Recent Labs    12/16/18 0846 12/18/18 0345  HGB 13.2 11.9*   Recent Labs    12/16/18 0846 12/18/18 0345  WBC 9.2 14.4*  RBC 4.31 4.07  HCT 40.1 37.8  PLT 270 247   Recent Labs    12/18/18 0345  NA 134*  K 3.7  CL 100  CO2 25  BUN 9  CREATININE 0.45  GLUCOSE 128*  CALCIUM 8.6*   No results for input(s): LABPT, INR in the last 72 hours.  Assessment/Plan: Pt is POD2 s/p left TKA.    -Plan to discharge to home today   -WBAT with a walker  -Okay to shower, dressing is waterproof.  Cautioned patient against soaking dressing in bath/pool/body of water  -Cautioned patient against using a pillow under their knee.  -F/u with Dr. Ninfa Linden in clinic in ~2 weeks     [name] 12/19/2018, 6:39 AM

## 2018-12-20 ENCOUNTER — Encounter (HOSPITAL_COMMUNITY): Payer: Self-pay | Admitting: Orthopaedic Surgery

## 2018-12-21 ENCOUNTER — Telehealth: Payer: Self-pay | Admitting: Orthopaedic Surgery

## 2018-12-21 DIAGNOSIS — M5134 Other intervertebral disc degeneration, thoracic region: Secondary | ICD-10-CM | POA: Diagnosis not present

## 2018-12-21 DIAGNOSIS — Z6839 Body mass index (BMI) 39.0-39.9, adult: Secondary | ICD-10-CM | POA: Diagnosis not present

## 2018-12-21 DIAGNOSIS — E559 Vitamin D deficiency, unspecified: Secondary | ICD-10-CM | POA: Diagnosis not present

## 2018-12-21 DIAGNOSIS — M069 Rheumatoid arthritis, unspecified: Secondary | ICD-10-CM | POA: Diagnosis not present

## 2018-12-21 DIAGNOSIS — Z471 Aftercare following joint replacement surgery: Secondary | ICD-10-CM | POA: Diagnosis not present

## 2018-12-21 DIAGNOSIS — K219 Gastro-esophageal reflux disease without esophagitis: Secondary | ICD-10-CM | POA: Diagnosis not present

## 2018-12-21 DIAGNOSIS — Z96652 Presence of left artificial knee joint: Secondary | ICD-10-CM | POA: Diagnosis not present

## 2018-12-21 DIAGNOSIS — M5136 Other intervertebral disc degeneration, lumbar region: Secondary | ICD-10-CM | POA: Diagnosis not present

## 2018-12-21 DIAGNOSIS — E669 Obesity, unspecified: Secondary | ICD-10-CM | POA: Diagnosis not present

## 2018-12-21 NOTE — Telephone Encounter (Signed)
Verbal order given  

## 2018-12-21 NOTE — Telephone Encounter (Signed)
Cindee Salt with [facility] called in requesting verbal orders for physical therapy for this pt for 3 times a week for 2 weeks.  [phone]

## 2018-12-22 ENCOUNTER — Encounter: Payer: Self-pay | Admitting: Rheumatology

## 2018-12-22 ENCOUNTER — Other Ambulatory Visit: Payer: Self-pay

## 2018-12-22 ENCOUNTER — Encounter: Payer: Self-pay | Admitting: Orthopaedic Surgery

## 2018-12-22 DIAGNOSIS — M7989 Other specified soft tissue disorders: Secondary | ICD-10-CM

## 2018-12-22 NOTE — Telephone Encounter (Signed)
It most likely is due to the recent knee joint replacement.  She is trying all the right measures.  Once she is 2 weeks postop she should get clearance from Dr. Ninfa Linden start her medications.

## 2018-12-23 ENCOUNTER — Other Ambulatory Visit: Payer: Self-pay

## 2018-12-23 ENCOUNTER — Ambulatory Visit (HOSPITAL_COMMUNITY)
Admission: RE | Admit: 2018-12-23 | Discharge: 2018-12-23 | Disposition: A | Discharge status: Home / Self Care | Payer: BC Managed Care – PPO | Source: Ambulatory Visit | Attending: Physician Assistant | Admitting: Physician Assistant

## 2018-12-23 ENCOUNTER — Telehealth: Payer: Self-pay | Admitting: Orthopaedic Surgery

## 2018-12-23 ENCOUNTER — Telehealth: Payer: Self-pay | Admitting: Radiology

## 2018-12-23 DIAGNOSIS — M5136 Other intervertebral disc degeneration, lumbar region: Secondary | ICD-10-CM | POA: Diagnosis not present

## 2018-12-23 DIAGNOSIS — E669 Obesity, unspecified: Secondary | ICD-10-CM | POA: Diagnosis not present

## 2018-12-23 DIAGNOSIS — M7989 Other specified soft tissue disorders: Secondary | ICD-10-CM

## 2018-12-23 DIAGNOSIS — K219 Gastro-esophageal reflux disease without esophagitis: Secondary | ICD-10-CM | POA: Diagnosis not present

## 2018-12-23 DIAGNOSIS — Z471 Aftercare following joint replacement surgery: Secondary | ICD-10-CM | POA: Diagnosis not present

## 2018-12-23 DIAGNOSIS — Z96652 Presence of left artificial knee joint: Secondary | ICD-10-CM | POA: Diagnosis not present

## 2018-12-23 DIAGNOSIS — E559 Vitamin D deficiency, unspecified: Secondary | ICD-10-CM | POA: Diagnosis not present

## 2018-12-23 DIAGNOSIS — M069 Rheumatoid arthritis, unspecified: Secondary | ICD-10-CM | POA: Diagnosis not present

## 2018-12-23 DIAGNOSIS — M5134 Other intervertebral disc degeneration, thoracic region: Secondary | ICD-10-CM | POA: Diagnosis not present

## 2018-12-23 DIAGNOSIS — Z6839 Body mass index (BMI) 39.0-39.9, adult: Secondary | ICD-10-CM | POA: Diagnosis not present

## 2018-12-23 NOTE — Telephone Encounter (Signed)
Received call from Sharee Pimple with [facility]. Patient dopple is negative for DVT. I advised Sharee Pimple that she could let patient go.  Forwarding to you Dr. Marlou Sa as Gil/Dr. Ninfa Linden are out of the office and you are on call.  CB for Sharee Pimple is [phone]

## 2018-12-23 NOTE — Progress Notes (Signed)
LLE venous duplex       has been completed. Preliminary results can be found under CV proc through chart review. June Leap, BS, RDMS, RVT    Called results to [facility]

## 2018-12-23 NOTE — Telephone Encounter (Signed)
Can you advise for patient please Left total knee replacement 12/17/18

## 2018-12-23 NOTE — Telephone Encounter (Signed)
thx

## 2018-12-23 NOTE — Telephone Encounter (Signed)
Patient called asked if she can get 5 or 6 of the Oxycodone? Patient said hopefully after that she will not need the pain medicine anymore. The number to contact patient is [phone]

## 2018-12-24 ENCOUNTER — Encounter: Payer: Self-pay | Admitting: Orthopaedic Surgery

## 2018-12-24 DIAGNOSIS — M069 Rheumatoid arthritis, unspecified: Secondary | ICD-10-CM | POA: Diagnosis not present

## 2018-12-24 DIAGNOSIS — E559 Vitamin D deficiency, unspecified: Secondary | ICD-10-CM | POA: Diagnosis not present

## 2018-12-24 DIAGNOSIS — E669 Obesity, unspecified: Secondary | ICD-10-CM | POA: Diagnosis not present

## 2018-12-24 DIAGNOSIS — Z6839 Body mass index (BMI) 39.0-39.9, adult: Secondary | ICD-10-CM | POA: Diagnosis not present

## 2018-12-24 DIAGNOSIS — Z471 Aftercare following joint replacement surgery: Secondary | ICD-10-CM | POA: Diagnosis not present

## 2018-12-24 DIAGNOSIS — Z96652 Presence of left artificial knee joint: Secondary | ICD-10-CM | POA: Diagnosis not present

## 2018-12-24 DIAGNOSIS — M5136 Other intervertebral disc degeneration, lumbar region: Secondary | ICD-10-CM | POA: Diagnosis not present

## 2018-12-24 DIAGNOSIS — K219 Gastro-esophageal reflux disease without esophagitis: Secondary | ICD-10-CM | POA: Diagnosis not present

## 2018-12-24 DIAGNOSIS — M5134 Other intervertebral disc degeneration, thoracic region: Secondary | ICD-10-CM | POA: Diagnosis not present

## 2018-12-24 MED ORDER — OXYCODONE HCL 5 MG PO TABS: 5.0000 mg | ORAL_TABLET | Freq: Four times a day (QID) | ORAL | 0 refills | Status: DC | PRN

## 2018-12-24 NOTE — Telephone Encounter (Signed)
I called patient and advised. 

## 2018-12-24 NOTE — Telephone Encounter (Signed)
Sent!

## 2018-12-27 ENCOUNTER — Telehealth: Payer: Self-pay | Admitting: Orthopaedic Surgery

## 2018-12-27 ENCOUNTER — Other Ambulatory Visit: Payer: Self-pay

## 2018-12-27 DIAGNOSIS — E559 Vitamin D deficiency, unspecified: Secondary | ICD-10-CM | POA: Diagnosis not present

## 2018-12-27 DIAGNOSIS — Z6839 Body mass index (BMI) 39.0-39.9, adult: Secondary | ICD-10-CM | POA: Diagnosis not present

## 2018-12-27 DIAGNOSIS — E669 Obesity, unspecified: Secondary | ICD-10-CM | POA: Diagnosis not present

## 2018-12-27 DIAGNOSIS — Z96652 Presence of left artificial knee joint: Secondary | ICD-10-CM | POA: Diagnosis not present

## 2018-12-27 DIAGNOSIS — Z471 Aftercare following joint replacement surgery: Secondary | ICD-10-CM | POA: Diagnosis not present

## 2018-12-27 DIAGNOSIS — K219 Gastro-esophageal reflux disease without esophagitis: Secondary | ICD-10-CM | POA: Diagnosis not present

## 2018-12-27 DIAGNOSIS — M069 Rheumatoid arthritis, unspecified: Secondary | ICD-10-CM | POA: Diagnosis not present

## 2018-12-27 DIAGNOSIS — M5134 Other intervertebral disc degeneration, thoracic region: Secondary | ICD-10-CM | POA: Diagnosis not present

## 2018-12-27 DIAGNOSIS — M5136 Other intervertebral disc degeneration, lumbar region: Secondary | ICD-10-CM | POA: Diagnosis not present

## 2018-12-27 NOTE — Telephone Encounter (Signed)
Order sent to PT.

## 2018-12-27 NOTE — Telephone Encounter (Signed)
Patient called advised she was advised to set up out patient (PT) no later than today. Patient said she was told by the HHPT Cyril Mourning)   The number to contact patient is [phone]

## 2018-12-30 ENCOUNTER — Ambulatory Visit (INDEPENDENT_AMBULATORY_CARE_PROVIDER_SITE_OTHER): Payer: BC Managed Care – PPO | Admitting: Orthopaedic Surgery

## 2018-12-30 DIAGNOSIS — Z96652 Presence of left artificial knee joint: Secondary | ICD-10-CM

## 2018-12-30 MED ORDER — OXYCODONE HCL 5 MG PO TABS: 5.0000 mg | ORAL_TABLET | Freq: Four times a day (QID) | ORAL | 0 refills | Status: DC | PRN

## 2018-12-30 NOTE — Progress Notes (Signed)
The patient comes in 2 weeks tomorrow status post a left total knee arthroplasty.  She is doing well overall.  She is already going to start outpatient physical therapy next week.  She is been with home health therapy.  The best she has been able to flex her knees to about 83 degrees.  Exam today we remove the staples in place Steri-Strips.  On the left side she has almost full extension flexes to about 80 degrees in the office.  Her foot is not swollen.  We stressed the importance of outpatient physical therapy and getting her knee moving.  I will let her resume her rheumatologic medications at this point and continue anti-inflammatory.  I will send in some more oxycodone for her as well.  She can drive from my standpoint as long she is now taking narcotic and getting behind the wheel.  All question concerns were answered addressed.  We will see her back in 4 weeks for repeat clinical exam.

## 2018-12-31 ENCOUNTER — Encounter: Payer: BC Managed Care – PPO | Admitting: Family Medicine

## 2018-12-31 DIAGNOSIS — M5136 Other intervertebral disc degeneration, lumbar region: Secondary | ICD-10-CM | POA: Diagnosis not present

## 2018-12-31 DIAGNOSIS — E559 Vitamin D deficiency, unspecified: Secondary | ICD-10-CM | POA: Diagnosis not present

## 2018-12-31 DIAGNOSIS — M5134 Other intervertebral disc degeneration, thoracic region: Secondary | ICD-10-CM | POA: Diagnosis not present

## 2018-12-31 DIAGNOSIS — Z471 Aftercare following joint replacement surgery: Secondary | ICD-10-CM | POA: Diagnosis not present

## 2018-12-31 DIAGNOSIS — E669 Obesity, unspecified: Secondary | ICD-10-CM | POA: Diagnosis not present

## 2018-12-31 DIAGNOSIS — K219 Gastro-esophageal reflux disease without esophagitis: Secondary | ICD-10-CM | POA: Diagnosis not present

## 2018-12-31 DIAGNOSIS — Z96652 Presence of left artificial knee joint: Secondary | ICD-10-CM | POA: Diagnosis not present

## 2018-12-31 DIAGNOSIS — Z6839 Body mass index (BMI) 39.0-39.9, adult: Secondary | ICD-10-CM | POA: Diagnosis not present

## 2018-12-31 DIAGNOSIS — M069 Rheumatoid arthritis, unspecified: Secondary | ICD-10-CM | POA: Diagnosis not present

## 2019-01-04 ENCOUNTER — Ambulatory Visit: Payer: BC Managed Care – PPO | Attending: Orthopaedic Surgery

## 2019-01-04 ENCOUNTER — Other Ambulatory Visit: Payer: Self-pay

## 2019-01-04 DIAGNOSIS — M25662 Stiffness of left knee, not elsewhere classified: Secondary | ICD-10-CM | POA: Diagnosis not present

## 2019-01-04 DIAGNOSIS — M25562 Pain in left knee: Secondary | ICD-10-CM | POA: Diagnosis not present

## 2019-01-05 NOTE — Therapy (Cosign Needed)
[facility] [address] [place], [place], [address] Phone: [phone]   Fax:  [phone]  Physical Therapy Evaluation  Patient Details  Name: NIYLA MARONE MRN: [id number] Date of Birth: 14-Aug-1965 Referring Provider (PT): Doneen Poisson MD   Encounter Date: 01/04/2019  PT End of Session - 01/04/19 1845    Visit Number  1    Number of Visits  13    Date for PT Re-Evaluation  02/15/19    PT Start Time  1615    PT Stop Time  1715    PT Time Calculation (min)  60 min    Activity Tolerance  Patient tolerated treatment well    Behavior During Therapy  [facility] for tasks assessed/performed       Past Medical History:  Diagnosis Date   Allergy    Arthritis    Carpal tunnel syndrome 10/2018   DDD (degenerative disc disease), lumbar    DDD (degenerative disc disease), thoracic    Family history of adverse reaction to anesthesia    Mothers sister never woke up from surgery   GERD (gastroesophageal reflux disease)    Grade I diastolic dysfunction 2013   Heart murmur    Heart palpitations    History of   History of hiatal hernia    OA (osteoarthritis)    Obese    Rheumatoid arthritis (HCC)    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   Subjective Assessment - 01/04/19 1841    Subjective  Pt is a 53 y/o F who presents to PT s/p L TKA ~2.5 weeks ago. Pt first reports increased pain and difficulty with functional mobility (ambulation, stairs) ~4 years ago, with worsening sx within the past year. Pt reports  gradual reduction of activity over time, with most notable complaint being pain with prolonged activity and impaired ROM (primarily L knee flexion). Since her surgery, pt reports having had great PT through acute care and HH (5 visits), to which she still performs each day.  Pt reports a current 2/10 dull, achy pain which is considerably improved compared to before surgery, and with some residual swelling around her incision site. Pt is currently ambulating with a R SPC, for the past week, a significant improvement from prior usage of RW and unilateral AC. Pt is cleared for driving by her physician but has not tried yet 2/2 fear of moving her L LE into increased knee flexion and pain. Pt uses a combination of Tylenol/aleve and ice (post-exercise) daily and for pain management. Pt has goals of improving her overall ROM, strength, and next summer, to be able to walk on the beach with her daughter pain-free.    Pertinent History  RA, OA, DDD, Obesity    Limitations  Walking    Patient Stated Goals  Improve ROM, dec pain, improve gait mechanics/endurance    Currently in Pain?  Yes    Pain Score  2     Pain Location  Knee    Pain Orientation  Left;Posterior;Medial  Pain Descriptors / Indicators  Aching;Constant    Pain Type  Surgical pain    Pain Onset  1 to 4 weeks ago    Pain Frequency  Constant    Aggravating Factors   Prolonged activity, knee flexion    Pain Relieving Factors  Pain meds, ice    Effect of Pain on Daily Activities  Reduction of overall activity    Multiple Pain Sites  No         [facility] PT Assessment - 01/04/19 1849      Assessment   Medical Diagnosis  L TKA    Referring Provider (PT)  Doneen Poisson MD    Prior Therapy  Yes      Precautions   Precautions  None      Restrictions   Weight Bearing Restrictions  Yes      Balance Screen   Has the patient fallen in the past 6 months  No    Has the patient had a decrease in activity level because of a fear of  falling?   Yes    Is the patient reluctant to leave their home because of a fear of falling?   No      Home Public house manager residence    Living Arrangements  Spouse/significant other;Children    Type of Home  House    Home Access  Stairs to enter    Entrance Stairs-Number of Steps  10    Entrance Stairs-Rails  Right;Cannot reach both      Prior Function   Level of Independence  Independent    Vocation  Unemployed    Leisure  Being active with her daughter (40 y/o)      Cognition   Overall Cognitive Status  Within Functional Limits for tasks assessed      Observation/Other Assessments   Observations  Incision site clear of signs of infection      ROM / Strength   AROM / PROM / Strength  AROM;Strength      AROM   AROM Assessment Site  Knee    Right/Left Knee  Right;Left    Right Knee Extension  -5    Right Knee Flexion  142   supine   Left Knee Extension  -15    Left Knee Flexion  58   supine     Strength   Strength Assessment Site  Hip;Knee;Ankle    Right/Left Hip  Right;Left    Right Hip Flexion  5/5    Right Hip ABduction  5/5    Right Hip ADduction  5/5    Left Hip Flexion  3+/5    Left Hip ABduction  5/5    Left Hip ADduction  4/5    Right/Left Knee  Right;Left    Right Knee Flexion  5/5    Right Knee Extension  5/5    Left Knee Flexion  3+/5    Left Knee Extension  4/5    Right/Left Ankle  Right;Left    Right Ankle Dorsiflexion  5/5    Right Ankle Plantar Flexion  5/5    Left Ankle Dorsiflexion  5/5    Left Ankle Plantar Flexion  5/5      Palpation   Palpation comment  TTP lateral quad (VL)      Ambulation/Gait   Ambulation/Gait  Yes    Assistive device  Straight cane   R hand   Gait Pattern  Antalgic;Poor foot clearance - left;Abducted -  left;Left hip hike    Ambulation Surface  Level          Objective measurements completed on examination: See above findings.    TREATMENT  Therapeutic Exercise to improve her  functional ROM, strength, and activity tolerance with mobility.  Seated passive L knee flex -- 2x10, 5s hold Supine L SLR -- 2x15, 5s hold Supine L quad sets -- 2x15,5s hold Seated L LAQ -- 2x15, 5s hold Prone hang to improve L knee ext ROM -- 5 min  At the end of the session, pt performed exercises without exacerbation of sx.          PT Education - 01/04/19 1844    Education Details  Pt educated on plan of care and prognosis. Educated on technique/form. HEP prescribed as follows: Seated SLR flexion 2x15 5s, seated LAQ 2x15 5s, prone knee ext hang 5 min, seated passive knee flexion 2x15 5s.    Person(s) Educated  Patient    Methods  Explanation;Demonstration;Tactile cues;Verbal cues;Handout    Comprehension  Returned demonstration;Verbalized understanding       PT Short Term Goals - 01/05/19 1005      PT SHORT TERM GOAL #1   Title  Pt will be compliant and independent with her HEP.    Time  2    Period  Weeks    Status  New    Target Date  01/19/19        PT Long Term Goals - 01/05/19 1005      PT LONG TERM GOAL #1   Title  Pt will report 0/10 pain at rest and with activity, demonstrating a significantly improved pain response with mobility.    Baseline  2/10    Time  6    Period  Weeks    Status  New    Target Date  02/16/19      PT LONG TERM GOAL #2   Title  Pt will demonstrate a 10% reduction on her LEFS to demonstrate a significantly improved overall function and reduction in disability.    Baseline  LEFS deferred to next session 2/2 time, check next session note for score.    Time  6    Period  Weeks    Status  New    Target Date  02/16/19      PT LONG TERM GOAL #3   Title  Pt will demonstrate at least 120 degrees of L knee flexion to demonstrate improved ability to navigate stairs and ambulate with improved mechanics and functional ROM.    Baseline  L knee flex 80 deg in seated position (58 deg supine)    Time  6    Period  Weeks    Status  New     Target Date  02/16/19      PT LONG TERM GOAL #4   Title  Pt will demonstrate 0 deg of left knee extension to demonstrate significantly improved L knee ROM, which will allow her to ambulate with improved mechanics and independence.    Baseline  L knee ext lacking 15 deg    Time  6    Period  Weeks    Status  New    Target Date  02/16/19             Plan - 01/04/19 1846    Clinical Impression Statement  Pt is a 53 y/o F who presents to PT s/p L TKA ~2.5 weeks ago with a primary PT dx of  impaired L knee joint mobility (most notably knee flexion), and with secondary diagnoses of impaired strength, increased pain response, and impaired gait. Pt presents with L knee ext (lacking 15 deg), L knee flex (58 deg supine, 80 deg seated and post-5 reps of passive knee flexion), compared to R knee ext (lacking 5 deg) and R knee flex (142 deg). Pt reports diminished sensation to LT inferior to her incision site and notable TTP at lateral quad (VL). Pt demonstrates 3+/5 L hip flex, 4/5 L hip add, 4/5 L knee ext, and 3+/5 L knee flex 2/2 to muscle guarding and demonstrative caution with L LE movement. Pt demonstrates great quad activation with supine exercises (QS, SLR, heel prop) and great knowledge of exercises, as well as technique. Pt demonstrates impaired L knee ROM, impaired L LE strength, increased pain/swelling, as well as decreased activity tolerance. Pt will benefit from skilled therapy treatment in order to return to prior level of function and to achieve her long-term goals.    Personal Factors and Comorbidities  Comorbidity 1;Comorbidity 3+;Comorbidity 2    Comorbidities  s/p TKA, obesity, OA, RA, DDD    Examination-Activity Limitations  Sleep;Locomotion Level;Stairs;Squat    Examination-Participation Restrictions  Community Activity    Stability/Clinical Decision Making  Stable/Uncomplicated    Clinical Decision Making  Low    Rehab Potential  Good    PT Frequency  2x / week    PT Duration  6  weeks    PT Treatment/Interventions  ADLs/Self Care Home Management;Electrical Stimulation;Cryotherapy;Moist Heat;Gait training;Neuromuscular re-education;Stair training;Functional mobility training;Therapeutic activities;Patient/family education;Therapeutic exercise;Balance training;Dry needling;Passive range of motion;Scar mobilization;Manual techniques;Joint Manipulations    PT Next Visit Plan  Assess stairs, gait further. Continue to progress ROM/strengthening exercises and HEP.    PT Home Exercise Plan  See education section.    Consulted and Agree with Plan of Care  Patient       Patient will benefit from skilled therapeutic intervention in order to improve the following deficits and impairments:  Abnormal gait, Decreased range of motion, Difficulty walking, Increased muscle spasms, Decreased endurance, Decreased activity tolerance, Pain, Improper body mechanics  Visit Diagnosis: 1. Acute pain of left knee   2. Stiffness of left knee, not elsewhere classified        Problem List Patient Active Problem List   Diagnosis Date Noted   Unilateral primary osteoarthritis, left knee 12/17/2018   Status post total left knee replacement 12/17/2018   Primary osteoarthritis of left knee 11/23/2018   Carpal tunnel syndrome, left upper limb 11/23/2018   BMI 40.0-44.9, adult (HCC) 07/12/2018   Primary osteoarthritis of both knees 12/24/2016   DDD (degenerative disc disease), thoracic 11/17/2016   DDD (degenerative disc disease), lumbar 11/17/2016   Vitamin D deficiency 11/17/2016   History of rosacea/ History of acne 11/17/2016   High risk medications (not anticoagulants) long-term use 06/25/2016   History of degenerative disc disease 06/25/2016   Severe obesity (BMI >= 40) (HCC) 06/13/2014   Left-sided thoracic back pain 07/19/2013   PALPITATIONS 10/17/2009   UTI 05/24/2009   Rheumatoid arthritis (HCC) 05/24/2009   Hair loss 02/22/2009   VAGINAL BLEEDING 10/05/2008    Carrier of group B Streptococcus 07/28/2007   GERD 12/28/2006   CARDIAC MURMUR, HX OF 12/28/2006   HIATAL HERNIA, HX OF 12/28/2006    Aram Beechamynthia C. Porfilio, MPT *Evaluation and treatment overseen by licensed PT   Sanda Lingerhomas Jones, SPT 01/05/2019, 10:10 AM  [facility] [address] [place], [address] Phone: [phone]   Fax:  [phone]  Name: JANIAH DEVINNEY MRN: [id number] Date of Birth: 11/30/1965

## 2019-01-06 ENCOUNTER — Other Ambulatory Visit: Payer: Self-pay

## 2019-01-06 ENCOUNTER — Ambulatory Visit: Payer: BC Managed Care – PPO

## 2019-01-06 DIAGNOSIS — M25662 Stiffness of left knee, not elsewhere classified: Secondary | ICD-10-CM

## 2019-01-06 DIAGNOSIS — M25562 Pain in left knee: Secondary | ICD-10-CM | POA: Diagnosis not present

## 2019-01-06 NOTE — Therapy (Signed)
[place] [facility] [address] [place], [place], [address] Phone: [phone]   Fax:  [phone]  Physical Therapy Treatment  Patient Details  Name: Donna SchimkeJulie S Dennin MRN: [id number] Date of Birth: 02-25-1966 Referring Provider (PT): Doneen PoissonBlackman, Christopher MD   Encounter Date: 01/06/2019  PT End of Session - 01/06/19 1737    Visit Number  2    Number of Visits  13    Date for PT Re-Evaluation  02/15/19    PT Start Time  1645    PT Stop Time  1730    PT Time Calculation (min)  45 min    Activity Tolerance  Patient tolerated treatment well    Behavior During Therapy  [facility] assessed/performed       Past Medical History:  Diagnosis Date   Allergy    Arthritis    Carpal tunnel syndrome 10/2018   DDD (degenerative disc disease), lumbar    DDD (degenerative disc disease), thoracic    Family history of adverse reaction to anesthesia    Mothers sister never woke up from surgery   GERD (gastroesophageal reflux disease)    Grade I diastolic dysfunction 2013   Heart murmur    Heart palpitations    History of   History of hiatal hernia    OA (osteoarthritis)    Obese    Rheumatoid arthritis (HCC)    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    Vitamin D deficiency     Past Surgical History:  Procedure Laterality Date   COLONOSCOPY  2014   DB- normal    NO PAST SURGERIES     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   Procedure: LEFT TOTAL KNEE ARTHROPLASTY;  Surgeon: Kathryne HitchBlackman, Christopher Y, MD;  Location: [facility];  Service: Orthopedics;  Laterality: Left;   UPPER GASTROINTESTINAL ENDOSCOPY      There were no vitals filed for this visit.  Subjective Assessment - 01/06/19 1649    Subjective  Pt reports a current 2/10 pain. Pt states that she her L knee felt stiff following her initial visit (~5/10). Pt able to perform her exercises at home independently.    Pertinent History  RA, OA, DDD, Obesity     Limitations  Walking    How long can you stand comfortably?  10 min    How long can you walk comfortably?  10 min    Patient Stated Goals  Improve ROM, dec pain, improve gait mechanics/endurance    Currently in Pain?  Yes    Pain Score  2     Pain Location  Knee    Pain Orientation  Left    Pain Descriptors / Indicators  Aching;Constant    Pain Type  Surgical pain    Pain Onset  1 to 4 weeks ago       TREATMENT  Manual Therapy to improve overall knee mobility, reduce muscle spasm/guarding, and reduce pain response.  PROM L knee ext (supine) Patellar mobs (infra, supra, M/L) PROM L knee flex (seated) with inferior joint distraction   Therapeutic Exercise to improve her functional ROM, strength, and activity tolerance with mobility.   Seated passive L knee flex -- 2x10, 5s hold Supine L SLR -- 2x15, 5s hold Supine L quad sets -- 2x15,5s hold Seated active knee flex with ball - x10  Standing mini squats with UE support x10 CKC L knee flex on 6 step 2x10  Therapeutic Activities to improve her activity tolerance, gait/stair negotiation mechanics, and  strength with functional mobility tasks.  Performed heel-to-toe walking to address gait mechanics - 20 feet x6  Negotiated 4, 7 stairs (ascent and descent) with unilateral UE support - x4 Step ups with bilateral UE support (left leading) - 2x10   At the end of the session, pt performed exercises without exacerbation of sx.     PT Education - 01/06/19 1737    Education Details  Pt educated on technique/form. HEP to continue as is.    Person(s) Educated  Patient    Methods  Explanation;Demonstration;Tactile cues;Verbal cues    Comprehension  Verbalized understanding;Returned demonstration       PT Short Term Goals - 01/05/19 1005      PT SHORT TERM GOAL #1   Title  Pt will be compliant and independent with her HEP.    Time  2    Period  Weeks    Status  New    Target Date  01/19/19        PT Long Term Goals -  01/06/19 1740      PT LONG TERM GOAL #1   Title  Pt will report 0/10 pain at rest and with activity, demonstrating a significantly improved pain response with mobility.    Baseline  2/10    Time  6    Period  Weeks    Status  New      PT LONG TERM GOAL #2   Title  Pt will demonstrate a 10% reduction on her LEFS to demonstrate a significantly improved overall function and reduction in disability.    Baseline  LEFS deferred to next session 2/2 time, check next session note for score.; 01/06/19 33/80    Time  6    Period  Weeks    Status  New    Target Date  02/17/19      PT LONG TERM GOAL #3   Title  Pt will demonstrate at least 120 degrees of L knee flexion to demonstrate improved ability to navigate stairs and ambulate with improved mechanics and functional ROM.    Baseline  L knee flex 80 deg in seated position (58 deg supine)    Time  6    Period  Weeks    Status  New    Target Date  02/17/19      PT LONG TERM GOAL #4   Title  Pt will demonstrate 0 deg of left knee extension to demonstrate significantly improved L knee ROM, which will allow her to ambulate with improved mechanics and independence.    Baseline  L knee ext lacking 15 deg    Time  6    Period  Weeks    Status  New    Target Date  02/17/19            Plan - 01/06/19 1737    Clinical Impression Statement  Pt continues to demonstrate adequate activity tolerance with exercise and manual therapy interventions. Pt demonstrated increased muscular guarding at medial and lateral quadriceps, as well as possible hip IR/add involvement. Pt able to perform 75-80 degrees of L active/passive knee flexion throughout the session. Pt negotiated stairs with unilateral handrail and step-to pattern (and intermittent L hip circumduction) for ascent and descent (R leading ascent, L leading descent). Pt performed gait with bilateral increased hip ER/toe out, decreased stride length, and decreased heel strike bilaterally. Pt also able  to perform a standing squat with UE support with good technique and depth, though she verbalizes ST  restrictions limiting her ability to increase her depth. Pt able to perform all exercises without increase in sx exacerbation. Pt still demonstrates impaired L knee ROM, strength/quad activation, motor control, increased pain/swelling, activity tolerance, and impaired gait/stair negotiation. Pt will benefit from continued therapy treatment in order to return to prior level of function.    Personal Factors and Comorbidities  Comorbidity 1;Comorbidity 3+;Comorbidity 2    Comorbidities  s/p TKA, obesity, OA, RA, DDD    Examination-Activity Limitations  Sleep;Locomotion Level;Stairs;Squat    Examination-Participation Restrictions  Community Activity    Stability/Clinical Decision Making  Stable/Uncomplicated    Rehab Potential  Good    PT Frequency  2x / week    PT Duration  6 weeks    PT Treatment/Interventions  ADLs/Self Care Home Management;Electrical Stimulation;Cryotherapy;Moist Heat;Gait training;Neuromuscular re-education;Stair training;Functional mobility training;Therapeutic activities;Patient/family education;Therapeutic exercise;Balance training;Dry needling;Passive range of motion;Scar mobilization;Manual techniques;Joint Manipulations    PT Next Visit Plan  Assess stairs, gait further. Continue to progress ROM/strengthening exercises and HEP.    PT Home Exercise Plan  See education section.    Consulted and Agree with Plan of Care  Patient       Patient will benefit from skilled therapeutic intervention in order to improve the following deficits and impairments:  Abnormal gait, Decreased range of motion, Difficulty walking, Increased muscle spasms, Decreased endurance, Decreased activity tolerance, Pain, Improper body mechanics  Visit Diagnosis: Acute pain of left knee  Stiffness of left knee, not elsewhere classified     Problem List Patient Active Problem List   Diagnosis Date  Noted   Unilateral primary osteoarthritis, left knee 12/17/2018   Status post total left knee replacement 12/17/2018   Primary osteoarthritis of left knee 11/23/2018   Carpal tunnel syndrome, left upper limb 11/23/2018   BMI 40.0-44.9, adult ([place]) 07/12/2018   Primary osteoarthritis of both knees 12/24/2016   DDD (degenerative disc disease), thoracic 11/17/2016   DDD (degenerative disc disease), lumbar 11/17/2016   Vitamin D deficiency 11/17/2016   History of rosacea/ History of acne 11/17/2016   High risk medications (not anticoagulants) long-term use 06/25/2016   History of degenerative disc disease 06/25/2016   Severe obesity (BMI >= 40) ([place]) 06/13/2014   Left-sided thoracic back pain 07/19/2013   PALPITATIONS 10/17/2009   UTI 05/24/2009   Rheumatoid arthritis ([place]) 05/24/2009   Hair loss 02/22/2009   VAGINAL BLEEDING 10/05/2008   Carrier of group B Streptococcus 07/28/2007   GERD 12/28/2006   CARDIAC MURMUR, HX OF 12/28/2006   HIATAL HERNIA, HX OF 12/28/2006    Scarlette Calico, SPT 01/06/2019, 5:54 PM  [place] [facility] [address] [place], [address] Phone: [phone]   Fax:  [phone]  Name: MASHAWN BRAZIL MRN: [id number] Date of Birth: 07-Mar-1966

## 2019-01-11 ENCOUNTER — Other Ambulatory Visit: Payer: Self-pay

## 2019-01-11 ENCOUNTER — Encounter: Payer: Self-pay | Admitting: Rheumatology

## 2019-01-11 ENCOUNTER — Encounter: Payer: Self-pay | Admitting: Orthopaedic Surgery

## 2019-01-11 ENCOUNTER — Ambulatory Visit: Payer: BC Managed Care – PPO

## 2019-01-11 DIAGNOSIS — M25662 Stiffness of left knee, not elsewhere classified: Secondary | ICD-10-CM

## 2019-01-11 DIAGNOSIS — M25562 Pain in left knee: Secondary | ICD-10-CM

## 2019-01-11 MED ORDER — PREDNISONE 5 MG PO TABS: ORAL_TABLET | ORAL | 0 refills | Status: DC

## 2019-01-11 NOTE — Telephone Encounter (Signed)
Okay to start on prednisone taper starting at 20 mg and taper by 5 mg every 4 days.

## 2019-01-11 NOTE — Therapy (Signed)
[place] [facility] [address] [place], [place], [address] Phone: [phone]   Fax:  [phone]  Physical Therapy Treatment  Patient Details  Name: Donna Lopez MRN: [id number] Date of Birth: 1966/05/09 Referring Provider (PT): Doneen Poisson MD   Encounter Date: 01/11/2019  PT End of Session - 01/11/19 1038    Visit Number  3    Number of Visits  13    Date for PT Re-Evaluation  02/15/19    PT Start Time  0900    PT Stop Time  0945    PT Time Calculation (min)  45 min    Activity Tolerance  Patient tolerated treatment well    Behavior During Therapy  [facility] for tasks assessed/performed       Past Medical History:  Diagnosis Date   Allergy    Arthritis    Carpal tunnel syndrome 10/2018   DDD (degenerative disc disease), lumbar    DDD (degenerative disc disease), thoracic    Family history of adverse reaction to anesthesia    Mothers sister never woke up from surgery   GERD (gastroesophageal reflux disease)    Grade I diastolic dysfunction 2013   Heart murmur    Heart palpitations    History of   History of hiatal hernia    OA (osteoarthritis)    Obese    Rheumatoid arthritis (HCC)    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  Subjective Assessment - 01/11/19 0907    Subjective  Pt reports a current 2/10 pain and being able to drive herself/go to the grocery store with minimal difficulty. States no major changes.    Pertinent History  RA, OA, DDD, Obesity    Limitations  Walking    How long can  you stand comfortably?  10 min    How long can you walk comfortably?  10 min    Patient Stated Goals  Improve ROM, dec pain, improve gait mechanics/endurance    Currently in Pain?  Yes    Pain Score  2     Pain Location  Knee    Pain Orientation  Left    Pain Descriptors / Indicators  Aching    Pain Onset  1 to 4 weeks ago       Manual Therapy to improve overall knee mobility, reduce muscle spasm/guarding, and reduce pain response.  STM to L quad (medial, lateral) PROM L knee ext (seated)  PROM L knee flex (seated) with OP   Therapeutic Exercise to improve her functional ROM, strength, and activity tolerance with mobility.   Seated passive L knee flex -- 2x10, 5s hold Seated L LAQ -- 2x8, 5s hold Supine L quad sets -- 2x8,5s hold Seated active knee flex with ball - x10, x10 with OP Standing ball squats on wall x20, depth to tolerance CKC L knee flex on 6 step 2x10   Therapeutic Activities to improve her activity tolerance, gait/stair negotiation mechanics, and strength with functional mobility tasks.   Performed heel-to-toe walking to  address gait mechanics - 20 feet x6, with and without SPC  Step ups with unilateral UE support (left leading) - 2x10   At the end of the session, pt performed exercises without exacerbation of sx.     PT Education - 01/11/19 1036    Education Details  Pt educated on technique/form. HEP to continue as is.    Person(s) Educated  Patient    Methods  Explanation;Demonstration;Verbal cues    Comprehension  Verbalized understanding;Returned demonstration       PT Short Term Goals - 01/05/19 1005      PT SHORT TERM GOAL #1   Title  Pt will be compliant and independent with her HEP.    Time  2    Period  Weeks    Status  New    Target Date  01/19/19        PT Long Term Goals - 01/06/19 1740      PT LONG TERM GOAL #1   Title  Pt will report 0/10 pain at rest and with activity, demonstrating a significantly improved pain response with  mobility.    Baseline  2/10    Time  6    Period  Weeks    Status  New      PT LONG TERM GOAL #2   Title  Pt will demonstrate a 10% reduction on her LEFS to demonstrate a significantly improved overall function and reduction in disability.    Baseline  LEFS deferred to next session 2/2 time, check next session note for score.; 01/06/19 33/80    Time  6    Period  Weeks    Status  New    Target Date  02/17/19      PT LONG TERM GOAL #3   Title  Pt will demonstrate at least 120 degrees of L knee flexion to demonstrate improved ability to navigate stairs and ambulate with improved mechanics and functional ROM.    Baseline  L knee flex 80 deg in seated position (58 deg supine)    Time  6    Period  Weeks    Status  New    Target Date  02/17/19      PT LONG TERM GOAL #4   Title  Pt will demonstrate 0 deg of left knee extension to demonstrate significantly improved L knee ROM, which will allow her to ambulate with improved mechanics and independence.    Baseline  L knee ext lacking 15 deg    Time  6    Period  Weeks    Status  New    Target Date  02/17/19            Plan - 01/11/19 1043    Clinical Impression Statement  Pt continues to demonstrate improved activity tolerance with exercise, L quad activation, and L knee mobility. Pt continues to demonstrate increased tender at medial and lateral quadriceps, which improved with gentle STM. Pt able to demonstrate 80 degrees of L active/passive knee flexion initially, with increase to 84 deg at the end of the session. Pt performed stair negotiation with improved forward ascent technique and decreased UE assistance required (progressed from two to one handrail used). Pt performed gait with improved mechanics, as denoted by her increased gait speed, decreased need for SPC, and bilateral heel strike, but she still demonstrates increased L hip circumduction and hip hiking. Pt also able to tolerate increased squat depth with wall squats with  improved technique. Pt able to  perform all exercises without increase in sx exacerbation. Pt still demonstrates impaired L knee ROM, strength/quad activation, motor control, increased pain/swelling, activity tolerance, and impaired gait/stair negotiation. Pt will benefit from continued therapy treatment in order to return to prior level of function.    Personal Factors and Comorbidities  Comorbidity 1;Comorbidity 3+;Comorbidity 2    Comorbidities  s/p TKA, obesity, OA, RA, DDD    Examination-Activity Limitations  Sleep;Locomotion Level;Stairs;Squat    Examination-Participation Restrictions  Community Activity    Stability/Clinical Decision Making  Stable/Uncomplicated    Rehab Potential  Good    PT Frequency  2x / week    PT Duration  6 weeks    PT Treatment/Interventions  ADLs/Self Care Home Management;Electrical Stimulation;Cryotherapy;Moist Heat;Gait training;Neuromuscular re-education;Stair training;Functional mobility training;Therapeutic activities;Patient/family education;Therapeutic exercise;Balance training;Dry needling;Passive range of motion;Scar mobilization;Manual techniques;Joint Manipulations    PT Next Visit Plan  Assess stairs, gait further. Continue to progress ROM/strengthening exercises and HEP.    PT Home Exercise Plan  See education section.    Consulted and Agree with Plan of Care  Patient       Patient will benefit from skilled therapeutic intervention in order to improve the following deficits and impairments:  Abnormal gait, Decreased range of motion, Difficulty walking, Increased muscle spasms, Decreased endurance, Decreased activity tolerance, Pain, Improper body mechanics  Visit Diagnosis: Acute pain of left knee  Stiffness of left knee, not elsewhere classified     Problem List Patient Active Problem List   Diagnosis Date Noted   Unilateral primary osteoarthritis, left knee 12/17/2018   Status post total left knee replacement 12/17/2018   Primary  osteoarthritis of left knee 11/23/2018   Carpal tunnel syndrome, left upper limb 11/23/2018   BMI 40.0-44.9, adult (HCC) 07/12/2018   Primary osteoarthritis of both knees 12/24/2016   DDD (degenerative disc disease), thoracic 11/17/2016   DDD (degenerative disc disease), lumbar 11/17/2016   Vitamin D deficiency 11/17/2016   History of rosacea/ History of acne 11/17/2016   High risk medications (not anticoagulants) long-term use 06/25/2016   History of degenerative disc disease 06/25/2016   Severe obesity (BMI >= 40) (HCC) 06/13/2014   Left-sided thoracic back pain 07/19/2013   PALPITATIONS 10/17/2009   UTI 05/24/2009   Rheumatoid arthritis (HCC) 05/24/2009   Hair loss 02/22/2009   VAGINAL BLEEDING 10/05/2008   Carrier of group B Streptococcus 07/28/2007   GERD 12/28/2006   CARDIAC MURMUR, HX OF 12/28/2006   HIATAL HERNIA, HX OF 12/28/2006    Sanda Linger, SPT 01/11/2019, 10:44 AM  [place] [facility] [address] [place], [address] Phone: [phone]   Fax:  [phone]  Name: Donna Lopez MRN: [id number] Date of Birth: May 13, 1966

## 2019-01-13 ENCOUNTER — Ambulatory Visit: Payer: BC Managed Care – PPO

## 2019-01-13 ENCOUNTER — Other Ambulatory Visit: Payer: Self-pay

## 2019-01-13 DIAGNOSIS — M25562 Pain in left knee: Secondary | ICD-10-CM | POA: Diagnosis not present

## 2019-01-13 DIAGNOSIS — M25662 Stiffness of left knee, not elsewhere classified: Secondary | ICD-10-CM

## 2019-01-13 NOTE — Therapy (Signed)
[facility] [address] [place], [address] Phone: [phone]   Fax:  [phone]  Physical Therapy Treatment  Patient Details  Name: Donna Lopez MRN: [id number] Date of Birth: 1965-06-29 Referring Provider (PT): Jean Rosenthal MD   Encounter Date: 01/13/2019  PT End of Session - 01/13/19 1213    Visit Number  4    Number of Visits  13    Date for PT Re-Evaluation  02/15/19    PT Start Time  1115    PT Stop Time  1200    PT Time Calculation (min)  45 min    Activity Tolerance  Patient tolerated treatment well    Behavior During Therapy  [facility] for tasks assessed/performed       Past Medical History:  Diagnosis Date  . Allergy   . Arthritis   . Carpal tunnel syndrome 10/2018  . DDD (degenerative disc disease), lumbar   . DDD (degenerative disc disease), thoracic   . Family history of adverse reaction to anesthesia    Mothers sister never woke up from surgery  . GERD (gastroesophageal reflux disease)   . Grade I diastolic dysfunction 9924  . Heart murmur   . Heart palpitations    History of  . History of hiatal hernia   . OA (osteoarthritis)   . Obese   . Rheumatoid arthritis ([place])   . 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 Vitamin D deficiency     Past Surgical History:  Procedure Laterality Date  . COLONOSCOPY  2014   DB- normal   . NO PAST SURGERIES    . 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   Procedure: LEFT TOTAL KNEE ARTHROPLASTY;  Surgeon: Mcarthur Rossetti, MD;  Location: [facility];  Service: Orthopedics;  Laterality: Left;  . UPPER GASTROINTESTINAL ENDOSCOPY      There were no vitals filed for this visit.  Subjective Assessment - 01/13/19 1211    Subjective  Patient reports her knee continues to feel about the same but states increased difficulty with performing her exercises. She reports she has been performing her HEP.    Pertinent History  RA, OA, DDD, Obesity    Limitations  Walking    How long can you stand comfortably?  10 min    How long can you walk comfortably?  10 min    Patient Stated Goals  Improve ROM, dec pain, improve gait mechanics/endurance    Currently in Pain?  Yes    Pain Score  2     Pain Location  Knee    Pain Orientation  Left    Pain Descriptors / Indicators  Aching    Pain Type  Surgical pain    Pain Onset  1 to 4 weeks ago    Pain Frequency  Constant          TREATMENT Therapeutic Exercise SLR with quad set in long sitting -- x 10 5 sec Biking forward at level 10 -- x 10 B directions Step ups onto 8" step -- x 15 Step ups onto 6" step -- x 15 Hip abduction in standing with lean into the affected side -- x 15 Knee flexion in sitting with AAROM from contralateral heel -- x 15 Squats throughout full pain free AROM -- x 15  Manual therapy: AAROM from therapist knee flexion with patient positioned in sitting to improve knee flexion angle and improve ability to perform bending with functional movements  Performed manual therapy  and therapeutic exercise to improve knee flexion and ability to walk for prolonged periods of time.    PT Education - 01/13/19 1213    Education Details  form/technique with exercise    Person(s) Educated  Patient    Methods  Explanation;Demonstration    Comprehension  Verbalized understanding;Returned demonstration       PT Short Term Goals - 01/05/19 1005      PT SHORT TERM GOAL #1   Title  Pt will be compliant and independent with her HEP.    Time  2    Period  Weeks    Status  New    Target Date  01/19/19        PT Long Term Goals - 01/06/19 1740      PT LONG TERM GOAL #1   Title  Pt will report 0/10 pain at rest and with activity, demonstrating a significantly improved pain response with mobility.    Baseline  2/10    Time  6    Period  Weeks    Status  New      PT LONG TERM GOAL #2   Title  Pt will demonstrate a 10% reduction on her LEFS to demonstrate a  significantly improved overall function and reduction in disability.    Baseline  LEFS deferred to next session 2/2 time, check next session note for score.; 01/06/19 33/80    Time  6    Period  Weeks    Status  New    Target Date  02/17/19      PT LONG TERM GOAL #3   Title  Pt will demonstrate at least 120 degrees of L knee flexion to demonstrate improved ability to navigate stairs and ambulate with improved mechanics and functional ROM.    Baseline  L knee flex 80 deg in seated position (58 deg supine)    Time  6    Period  Weeks    Status  New    Target Date  02/17/19      PT LONG TERM GOAL #4   Title  Pt will demonstrate 0 deg of left knee extension to demonstrate significantly improved L knee ROM, which will allow her to ambulate with improved mechanics and independence.    Baseline  L knee ext lacking 15 deg    Time  6    Period  Weeks    Status  New    Target Date  02/17/19            Plan - 01/13/19 1214    Clinical Impression Statement  Patient demosntrates ability to achieve a knee flexion angle from 82 degrees at the beginning of the session and 92 degrees at the end of the session. Patient demonstrates further improvement with ability to perform greater quad activation with SLR in long sitting. Patient continues to make progress but has dificulty with amb and knee flexion. Patient will benefit from further skilled therapy to return to prior level of function.    Personal Factors and Comorbidities  Comorbidity 1;Comorbidity 3+;Comorbidity 2    Comorbidities  s/p TKA, obesity, OA, RA, DDD    Examination-Activity Limitations  Sleep;Locomotion Level;Stairs;Squat    Examination-Participation Restrictions  Community Activity    Stability/Clinical Decision Making  Stable/Uncomplicated    Rehab Potential  Good    PT Frequency  2x / week    PT Duration  6 weeks    PT Treatment/Interventions  ADLs/Self Care Home Management;Electrical Stimulation;Cryotherapy;Moist Heat;Gait  training;Neuromuscular  re-education;Stair training;Functional mobility training;Therapeutic activities;Patient/family education;Therapeutic exercise;Balance training;Dry needling;Passive range of motion;Scar mobilization;Manual techniques;Joint Manipulations    PT Next Visit Plan  Assess stairs, gait further. Continue to progress ROM/strengthening exercises and HEP.    PT Home Exercise Plan  See education section.    Consulted and Agree with Plan of Care  Patient       Patient will benefit from skilled therapeutic intervention in order to improve the following deficits and impairments:  Abnormal gait, Decreased range of motion, Difficulty walking, Increased muscle spasms, Decreased endurance, Decreased activity tolerance, Pain, Improper body mechanics  Visit Diagnosis: Acute pain of left knee  Stiffness of left knee, not elsewhere classified     Problem List Patient Active Problem List   Diagnosis Date Noted  . Unilateral primary osteoarthritis, left knee 12/17/2018  . Status post total left knee replacement 12/17/2018  . Primary osteoarthritis of left knee 11/23/2018  . Carpal tunnel syndrome, left upper limb 11/23/2018  . BMI 40.0-44.9, adult (H[facility]) 07/12/2018  . Primary osteoarthritis of both knees 12/24/2016  . 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 DDD (degenerative disc disease), lumbar 11/17/2016  . Vitamin D deficiency 11/17/2016  . History of rosacea/ History of acne 11/17/2016  . High risk medications (not anticoagulants) long-term use 06/25/2016  . History of degenerative disc disease 06/25/2016  . Severe obesity (BMI >= 40) (HCC) 06/13/2014  . Left-sided thoracic back pain 07/19/2013  . PALPITATIONS 10/17/2009  . UTI 05/24/2009  . Rheumatoid arthritis ([facility]) 05/24/2009  . Hair loss 02/22/2009  . VAGINAL BLEEDING 10/05/2008  . Carrier of group B Streptococcus 07/28/2007  . GERD 12/28/2006  . CARDIAC MURMUR, HX OF 12/28/2006  . HIATAL HERNIA, HX OF  12/28/2006    Myrene Galas, PT DPT 01/13/2019, 12:17 PM  [facility] [address], [place], [address] Phone: [phone]   Fax:  [phone]  Name: Donna Lopez MRN: [id number] Date of Birth: 06/10/1965

## 2019-01-14 ENCOUNTER — Other Ambulatory Visit: Payer: Self-pay | Admitting: Rheumatology

## 2019-01-14 NOTE — Telephone Encounter (Signed)
Last Visit: 11/15/18 Next Visit: 04/18/19 Labs: 10/28/18 Glucose is elevated-126. Rest of CMP WNL. CBC WNL TB Gold: 10/28/18 Neg   Okay to refill per Dr. Estanislado Pandy

## 2019-01-18 ENCOUNTER — Other Ambulatory Visit: Payer: Self-pay

## 2019-01-18 ENCOUNTER — Ambulatory Visit: Payer: 59 | Attending: Orthopaedic Surgery

## 2019-01-18 ENCOUNTER — Other Ambulatory Visit: Payer: Self-pay | Admitting: Rheumatology

## 2019-01-18 DIAGNOSIS — M25662 Stiffness of left knee, not elsewhere classified: Secondary | ICD-10-CM | POA: Diagnosis present

## 2019-01-18 DIAGNOSIS — M25562 Pain in left knee: Secondary | ICD-10-CM | POA: Diagnosis not present

## 2019-01-18 DIAGNOSIS — M1712 Unilateral primary osteoarthritis, left knee: Secondary | ICD-10-CM | POA: Diagnosis not present

## 2019-01-18 NOTE — Therapy (Signed)
[facility] [address], [place], [address] Phone: [phone]   Fax:  [phone]  Physical Therapy Treatment  Patient Details  Name: Donna Lopez MRN: [id number] Date of Birth: 1965/08/17 Referring Provider (PT): Doneen Poisson MD   Encounter Date: 01/18/2019  PT End of Session - 01/18/19 1651    Visit Number  5    Number of Visits  13    Date for PT Re-Evaluation  02/15/19    PT Start Time  1615    PT Stop Time  1700    PT Time Calculation (min)  45 min    Activity Tolerance  Patient tolerated treatment well    Behavior During Therapy  [facility] for tasks assessed/performed       Past Medical History:  Diagnosis Date  . Allergy   . Arthritis   . Carpal tunnel syndrome 10/2018  . DDD (degenerative disc disease), lumbar   . DDD (degenerative disc disease), thoracic   . Family history of adverse reaction to anesthesia    Mothers sister never woke up from surgery  . GERD (gastroesophageal reflux disease)   . Grade I diastolic dysfunction 2013  . Heart murmur   . Heart palpitations    History of  . History of hiatal hernia   . OA (osteoarthritis)   . Obese   . Rheumatoid arthritis ([facility])   . 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 Vitamin D deficiency     Past Surgical History:  Procedure Laterality Date  . COLONOSCOPY  2014   DB- normal   . NO PAST SURGERIES    . 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   Procedure: LEFT TOTAL KNEE ARTHROPLASTY;  Surgeon: Kathryne Hitch, MD;  Location: [facility];  Service: Orthopedics;  Laterality: Left;  . UPPER GASTROINTESTINAL ENDOSCOPY      There were no vitals filed for this visit.  Subjective Assessment - 01/18/19 1622    Subjective  Patient reports improvement with the knee compared to previous sessions. Patient reports the knee bending has been improving.    Pertinent History  RA, OA, DDD, Obesity    Limitations  Walking    How long can you stand  comfortably?  10 min    How long can you walk comfortably?  10 min    Patient Stated Goals  Improve ROM, dec pain, improve gait mechanics/endurance    Currently in Pain?  Yes    Pain Score  2     Pain Location  Knee    Pain Orientation  Left    Pain Descriptors / Indicators  Aching    Pain Type  Surgical pain    Pain Onset  1 to 4 weeks ago    Pain Frequency  Constant       TREATMENT Therapeutic Exercise SLR with quad set in long sitting -- x 10 5 sec Biking forward at level 10 -- x 15 B directions Sit to stands with foot on a 3" step - x 15 Step ups onto 8" step -- x 15 Ball roll outs in sitting - x20 Squats throughout full pain free AROM -- x 15 with foot elevated on step to improve knee flexion B Hip abduction in standing with lean into the affected side -- x 15 with YTB   Performed therapeutic exercise to improve knee flexion and ability to walk for prolonged periods of time.       PT Education - 01/18/19 1651  Education Details  form/technique with exercise    Person(s) Educated  Patient    Methods  Explanation;Demonstration    Comprehension  Verbalized understanding;Returned demonstration       PT Short Term Goals - 01/05/19 1005      PT SHORT TERM GOAL #1   Title  Pt will be compliant and independent with her HEP.    Time  2    Period  Weeks    Status  New    Target Date  01/19/19        PT Long Term Goals - 01/06/19 1740      PT LONG TERM GOAL #1   Title  Pt will report 0/10 pain at rest and with activity, demonstrating a significantly improved pain response with mobility.    Baseline  2/10    Time  6    Period  Weeks    Status  New      PT LONG TERM GOAL #2   Title  Pt will demonstrate a 10% reduction on her LEFS to demonstrate a significantly improved overall function and reduction in disability.    Baseline  LEFS deferred to next session 2/2 time, check next session note for score.; 01/06/19 33/80    Time  6    Period  Weeks    Status  New     Target Date  02/17/19      PT LONG TERM GOAL #3   Title  Pt will demonstrate at least 120 degrees of L knee flexion to demonstrate improved ability to navigate stairs and ambulate with improved mechanics and functional ROM.    Baseline  L knee flex 80 deg in seated position (58 deg supine)    Time  6    Period  Weeks    Status  New    Target Date  02/17/19      PT LONG TERM GOAL #4   Title  Pt will demonstrate 0 deg of left knee extension to demonstrate significantly improved L knee ROM, which will allow her to ambulate with improved mechanics and independence.    Baseline  L knee ext lacking 15 deg    Time  6    Period  Weeks    Status  New    Target Date  02/17/19            Plan - 01/18/19 1655    Clinical Impression Statement  Patient is making improvement in terms of both knee  flexion and extension with ability to acheive ~ 5 deg from neutral in extension and 95 degrees in flexion. Patient continues to demosntrate decreased weight shift onto the affected side limiting her ability to ambulate prolonged distances. This limitation most likely due to compensatory motor patterns from previous years of increased pain. Ended session with patient's knee flexion at 95degrees and will continue to focus on improving AROM and fucntional strength to return to prior level of function.    Personal Factors and Comorbidities  Comorbidity 1;Comorbidity 3+;Comorbidity 2    Comorbidities  s/p TKA, obesity, OA, RA, DDD    Examination-Activity Limitations  Sleep;Locomotion Level;Stairs;Squat    Examination-Participation Restrictions  Community Activity    Stability/Clinical Decision Making  Stable/Uncomplicated    Rehab Potential  Good    PT Frequency  2x / week    PT Duration  6 weeks    PT Treatment/Interventions  ADLs/Self Care Home Management;Electrical Stimulation;Cryotherapy;Moist Heat;Gait training;Neuromuscular re-education;Stair training;Functional mobility training;Therapeutic  activities;Patient/family education;Therapeutic exercise;Balance training;Dry needling;Passive  range of motion;Scar mobilization;Manual techniques;Joint Manipulations    PT Next Visit Plan  Assess stairs, gait further. Continue to progress ROM/strengthening exercises and HEP.    PT Home Exercise Plan  See education section.    Consulted and Agree with Plan of Care  Patient       Patient will benefit from skilled therapeutic intervention in order to improve the following deficits and impairments:  Abnormal gait, Decreased range of motion, Difficulty walking, Increased muscle spasms, Decreased endurance, Decreased activity tolerance, Pain, Improper body mechanics  Visit Diagnosis: Acute pain of left knee  Stiffness of left knee, not elsewhere classified     Problem List Patient Active Problem List   Diagnosis Date Noted  . Unilateral primary osteoarthritis, left knee 12/17/2018  . Status post total left knee replacement 12/17/2018  . Primary osteoarthritis of left knee 11/23/2018  . Carpal tunnel syndrome, left upper limb 11/23/2018  . BMI 40.0-44.9, adult (HCC) 07/12/2018  . Primary osteoarthritis of both knees 12/24/2016  . 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 DDD (degenerative disc disease), lumbar 11/17/2016  . Vitamin D deficiency 11/17/2016  . History of rosacea/ History of acne 11/17/2016  . High risk medications (not anticoagulants) long-term use 06/25/2016  . History of degenerative disc disease 06/25/2016  . Severe obesity (BMI >= 40) (HCC) 06/13/2014  . Left-sided thoracic back pain 07/19/2013  . PALPITATIONS 10/17/2009  . UTI 05/24/2009  . Rheumatoid arthritis (HCC) 05/24/2009  . Hair loss 02/22/2009  . VAGINAL BLEEDING 10/05/2008  . Carrier of group B Streptococcus 07/28/2007  . GERD 12/28/2006  . CARDIAC MURMUR, HX OF 12/28/2006  . HIATAL HERNIA, HX OF 12/28/2006    Myrene GalasWesley Jumana Paccione, PT DPT 01/18/2019, 8:55 PM  [facility] [address] [place], [place], [id number] Phone: [phone]   Fax:  [phone]  Name: Dallas SchimkeJulie S Dass MRN: [id number] Date of Birth: 1965/08/06

## 2019-01-20 ENCOUNTER — Ambulatory Visit: Payer: 59

## 2019-01-20 ENCOUNTER — Other Ambulatory Visit: Payer: Self-pay

## 2019-01-20 DIAGNOSIS — M25562 Pain in left knee: Secondary | ICD-10-CM | POA: Diagnosis not present

## 2019-01-20 DIAGNOSIS — M25662 Stiffness of left knee, not elsewhere classified: Secondary | ICD-10-CM

## 2019-01-20 NOTE — Therapy (Signed)
[facility] [address], [place], [address] Phone: [phone]   Fax:  [phone]  Physical Therapy Treatment  Patient Details  Name: Donna Lopez MRN: [id number] Date of Birth: 1966-03-28 Referring Provider (PT): Doneen Poisson MD   Encounter Date: 01/20/2019  PT End of Session - 01/20/19 1633    Visit Number  6    Number of Visits  13    Date for PT Re-Evaluation  02/15/19    PT Start Time  1615    PT Stop Time  1700    PT Time Calculation (min)  45 min    Activity Tolerance  Patient tolerated treatment well    Behavior During Therapy  [facility] for tasks assessed/performed       Past Medical History:  Diagnosis Date  . Allergy   . Arthritis   . Carpal tunnel syndrome 10/2018  . DDD (degenerative disc disease), lumbar   . DDD (degenerative disc disease), thoracic   . Family history of adverse reaction to anesthesia    Mothers sister never woke up from surgery  . GERD (gastroesophageal reflux disease)   . Grade I diastolic dysfunction 2013  . Heart murmur   . Heart palpitations    History of  . History of hiatal hernia   . OA (osteoarthritis)   . Obese   . Rheumatoid arthritis (HCC)   . 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 Vitamin D deficiency     Past Surgical History:  Procedure Laterality Date  . COLONOSCOPY  2014   DB- normal   . NO PAST SURGERIES    . 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   Procedure: LEFT TOTAL KNEE ARTHROPLASTY;  Surgeon: Kathryne Hitch, MD;  Location: [facility];  Service: Orthopedics;  Laterality: Left;  . UPPER GASTROINTESTINAL ENDOSCOPY      There were no vitals filed for this visit.  Subjective Assessment - 01/20/19 1621    Subjective  Patient reports she did not take a muscle relaxer before her appointment. Patient reports he knee is feeling a little stiff today.    Pertinent History  RA, OA, DDD, Obesity    Limitations  Walking    How long can you  stand comfortably?  10 min    How long can you walk comfortably?  10 min    Patient Stated Goals  Improve ROM, dec pain, improve gait mechanics/endurance    Currently in Pain?  Yes    Pain Score  3     Pain Location  Knee    Pain Descriptors / Indicators  Aching    Pain Type  Surgical pain    Pain Onset  1 to 4 weeks ago    Pain Frequency  Constant        Therapeutic Exercise SLR with quad set in long sitting -- x 10 2 sec Biking forward at level 10 -- x 20 B directions Ball roll outs in sitting - x20 Step ups onto 8" step -- x 15 Lunges on top of 3" step with UE support - x 20 Running man in standing - x 20 with UE support and furniture slider Squats throughout full pain free AROM -- x20 with foot elevated on step to improve knee flexion B    Performed therapeutic exercise to improve knee flexion and ability to walk for prolonged periods of time.     PT Education - 01/20/19 1632    Education Details  form/technique with  exercise    Person(s) Educated  Patient    Methods  Explanation;Demonstration    Comprehension  Verbalized understanding;Returned demonstration       PT Short Term Goals - 01/05/19 1005      PT SHORT TERM GOAL #1   Title  Pt will be compliant and independent with her HEP.    Time  2    Period  Weeks    Status  New    Target Date  01/19/19        PT Long Term Goals - 01/06/19 1740      PT LONG TERM GOAL #1   Title  Pt will report 0/10 pain at rest and with activity, demonstrating a significantly improved pain response with mobility.    Baseline  2/10    Time  6    Period  Weeks    Status  New      PT LONG TERM GOAL #2   Title  Pt will demonstrate a 10% reduction on her LEFS to demonstrate a significantly improved overall function and reduction in disability.    Baseline  LEFS deferred to next session 2/2 time, check next session note for score.; 01/06/19 33/80    Time  6    Period  Weeks    Status  New    Target Date  02/17/19      PT  LONG TERM GOAL #3   Title  Pt will demonstrate at least 120 degrees of L knee flexion to demonstrate improved ability to navigate stairs and ambulate with improved mechanics and functional ROM.    Baseline  L knee flex 80 deg in seated position (58 deg supine)    Time  6    Period  Weeks    Status  New    Target Date  02/17/19      PT LONG TERM GOAL #4   Title  Pt will demonstrate 0 deg of left knee extension to demonstrate significantly improved L knee ROM, which will allow her to ambulate with improved mechanics and independence.    Baseline  L knee ext lacking 15 deg    Time  6    Period  Weeks    Status  New    Target Date  02/17/19            Plan - 01/20/19 1634    Clinical Impression Statement  Patient able to demonstrate a baseline knee flexion measurement on 94 degrees at the start of the session indicating improvement in muscle length and improvement muscular guarding. Patient demonstrates no signficant increase in pain throughout the session and continues to have difficulty with ambulating for long periods of time and bending in standing. Patient will benefit from further skilled therapy focused on improving limitations to return to prior level of function.    Personal Factors and Comorbidities  Comorbidity 1;Comorbidity 3+;Comorbidity 2    Comorbidities  s/p TKA, obesity, OA, RA, DDD    Examination-Activity Limitations  Sleep;Locomotion Level;Stairs;Squat    Examination-Participation Restrictions  Community Activity    Stability/Clinical Decision Making  Stable/Uncomplicated    Rehab Potential  Good    PT Frequency  2x / week    PT Duration  6 weeks    PT Treatment/Interventions  ADLs/Self Care Home Management;Electrical Stimulation;Cryotherapy;Moist Heat;Gait training;Neuromuscular re-education;Stair training;Functional mobility training;Therapeutic activities;Patient/family education;Therapeutic exercise;Balance training;Dry needling;Passive range of motion;Scar  mobilization;Manual techniques;Joint Manipulations    PT Next Visit Plan  Assess stairs, gait further. Continue to progress ROM/strengthening  exercises and HEP.    PT Home Exercise Plan  See education section.    Consulted and Agree with Plan of Care  Patient       Patient will benefit from skilled therapeutic intervention in order to improve the following deficits and impairments:  Abnormal gait, Decreased range of motion, Difficulty walking, Increased muscle spasms, Decreased endurance, Decreased activity tolerance, Pain, Improper body mechanics  Visit Diagnosis: Stiffness of left knee, not elsewhere classified  Acute pain of left knee     Problem List Patient Active Problem List   Diagnosis Date Noted  . Unilateral primary osteoarthritis, left knee 12/17/2018  . Status post total left knee replacement 12/17/2018  . Primary osteoarthritis of left knee 11/23/2018  . Carpal tunnel syndrome, left upper limb 11/23/2018  . BMI 40.0-44.9, adult ([place]) 07/12/2018  . Primary osteoarthritis of both knees 12/24/2016  . 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 DDD (degenerative disc disease), lumbar 11/17/2016  . Vitamin D deficiency 11/17/2016  . History of rosacea/ History of acne 11/17/2016  . High risk medications (not anticoagulants) long-term use 06/25/2016  . History of degenerative disc disease 06/25/2016  . Severe obesity (BMI >= 40) ([place]) 06/13/2014  . Left-sided thoracic back pain 07/19/2013  . PALPITATIONS 10/17/2009  . UTI 05/24/2009  . Rheumatoid arthritis ([place]) 05/24/2009  . Hair loss 02/22/2009  . VAGINAL BLEEDING 10/05/2008  . Carrier of group B Streptococcus 07/28/2007  . GERD 12/28/2006  . CARDIAC MURMUR, HX OF 12/28/2006  . HIATAL HERNIA, HX OF 12/28/2006    Blythe Stanford, PT DPT 01/20/2019, 4:46 PM  [place] [facility] [address], [place], [address] Phone: [phone]   Fax:   [phone]  Name: Donna Lopez MRN: [id number] Date of Birth: 1966/02/05

## 2019-01-25 ENCOUNTER — Other Ambulatory Visit: Payer: Self-pay

## 2019-01-25 ENCOUNTER — Ambulatory Visit: Payer: 59

## 2019-01-25 DIAGNOSIS — M25562 Pain in left knee: Secondary | ICD-10-CM

## 2019-01-25 DIAGNOSIS — M25662 Stiffness of left knee, not elsewhere classified: Secondary | ICD-10-CM

## 2019-01-25 NOTE — Therapy (Signed)
[facility] [facility] [address], [place], [address] Phone: [phone]   Fax:  [phone]  Physical Therapy Treatment  Patient Details  Name: Donna Lopez MRN: [id number] Date of Birth: 1965-08-10 Referring Provider (PT): Jean Rosenthal MD   Encounter Date: 01/25/2019  PT End of Session - 01/25/19 1810    Visit Number  7    Number of Visits  13    Date for PT Re-Evaluation  02/15/19    PT Start Time  1700    PT Stop Time  1745    PT Time Calculation (min)  45 min    Activity Tolerance  Patient tolerated treatment well    Behavior During Therapy  [facility] for tasks assessed/performed       Past Medical History:  Diagnosis Date  . Allergy   . Arthritis   . Carpal tunnel syndrome 10/2018  . DDD (degenerative disc disease), lumbar   . DDD (degenerative disc disease), thoracic   . Family history of adverse reaction to anesthesia    Mothers sister never woke up from surgery  . GERD (gastroesophageal reflux disease)   . Grade I diastolic dysfunction 8242  . Heart murmur   . Heart palpitations    History of  . History of hiatal hernia   . OA (osteoarthritis)   . Obese   . Rheumatoid arthritis ([place])   . 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 Vitamin D deficiency     Past Surgical History:  Procedure Laterality Date  . COLONOSCOPY  2014   DB- normal   . NO PAST SURGERIES    . 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   Procedure: LEFT TOTAL KNEE ARTHROPLASTY;  Surgeon: Mcarthur Rossetti, MD;  Location: [facility];  Service: Orthopedics;  Laterality: Left;  . UPPER GASTROINTESTINAL ENDOSCOPY      There were no vitals filed for this visit.  Subjective Assessment - 01/25/19 1805    Subjective  Patient reports she has been performing her HEP and states she is improving overall.    Pertinent History  RA, OA, DDD, Obesity    Limitations  Walking    How long can you stand comfortably?  10 min    How long can you  walk comfortably?  10 min    Patient Stated Goals  Improve ROM, dec pain, improve gait mechanics/endurance    Currently in Pain?  Yes    Pain Score  3     Pain Location  Knee    Pain Orientation  Left    Pain Descriptors / Indicators  Aching    Pain Type  Surgical pain    Pain Onset  1 to 4 weeks ago    Pain Frequency  Constant       TREATMENT Therapeutic Exercise Seated Knee flexion with GTB - x20 Descending stairs - x20  Ball roll outs in sitting - x20 Sit to stands -  20 with unaffected foot on the 4" step  TKE on single leg with B UE support Biking forward at level 10 -- x 20 B directions Step ups onto 6" step -- x 15  Squats throughout full pain free AROM -- x20 with foot elevated on step to improve knee flexion  Performed exercises to improve pain and spasms and increase knee AROM and strength   PT Education - 01/25/19 1809    Education Details  form/technique with exercise    Person(s) Educated  Patient  Methods  Explanation;Demonstration    Comprehension  Verbalized understanding;Returned demonstration       PT Short Term Goals - 01/05/19 1005      PT SHORT TERM GOAL #1   Title  Pt will be compliant and independent with her HEP.    Time  2    Period  Weeks    Status  New    Target Date  01/19/19        PT Long Term Goals - 01/06/19 1740      PT LONG TERM GOAL #1   Title  Pt will report 0/10 pain at rest and with activity, demonstrating a significantly improved pain response with mobility.    Baseline  2/10    Time  6    Period  Weeks    Status  New      PT LONG TERM GOAL #2   Title  Pt will demonstrate a 10% reduction on her LEFS to demonstrate a significantly improved overall function and reduction in disability.    Baseline  LEFS deferred to next session 2/2 time, check next session note for score.; 01/06/19 33/80    Time  6    Period  Weeks    Status  New    Target Date  02/17/19      PT LONG TERM GOAL #3   Title  Pt will demonstrate at  least 120 degrees of L knee flexion to demonstrate improved ability to navigate stairs and ambulate with improved mechanics and functional ROM.    Baseline  L knee flex 80 deg in seated position (58 deg supine)    Time  6    Period  Weeks    Status  New    Target Date  02/17/19      PT LONG TERM GOAL #4   Title  Pt will demonstrate 0 deg of left knee extension to demonstrate significantly improved L knee ROM, which will allow her to ambulate with improved mechanics and independence.    Baseline  L knee ext lacking 15 deg    Time  6    Period  Weeks    Status  New    Target Date  02/17/19            Plan - 01/25/19 1814    Clinical Impression Statement  Patient able to demosntrate an improvement in knee flexion from 92 to 98 degrees by the end of the session indicating singificant improvement in knee flexion allowing for greater movement with functional activity such as squatting and performing weight bearing knee bends. Patient demonstrates difficulty overall with descending the stairs, which is improving, but performing requires hip hiking to progress the contralateral LE forward. Patient is improving overall but continues to demosntrate strength and AROM limitations. Patient will benefit from further skilled therapy focused on improving limitations to return to prior level of function.    Personal Factors and Comorbidities  Comorbidity 1;Comorbidity 3+;Comorbidity 2    Comorbidities  s/p TKA, obesity, OA, RA, DDD    Examination-Activity Limitations  Sleep;Locomotion Level;Stairs;Squat    Examination-Participation Restrictions  Community Activity    Stability/Clinical Decision Making  Stable/Uncomplicated    Rehab Potential  Good    PT Frequency  2x / week    PT Duration  6 weeks    PT Treatment/Interventions  ADLs/Self Care Home Management;Electrical Stimulation;Cryotherapy;Moist Heat;Gait training;Neuromuscular re-education;Stair training;Functional mobility training;Therapeutic  activities;Patient/family education;Therapeutic exercise;Balance training;Dry needling;Passive range of motion;Scar mobilization;Manual techniques;Joint Manipulations    PT  Next Visit Plan  Assess stairs, gait further. Continue to progress ROM/strengthening exercises and HEP.    PT Home Exercise Plan  See education section.    Consulted and Agree with Plan of Care  Patient       Patient will benefit from skilled therapeutic intervention in order to improve the following deficits and impairments:  Abnormal gait, Decreased range of motion, Difficulty walking, Increased muscle spasms, Decreased endurance, Decreased activity tolerance, Pain, Improper body mechanics  Visit Diagnosis: Stiffness of left knee, not elsewhere classified  Acute pain of left knee     Problem List Patient Active Problem List   Diagnosis Date Noted  . Unilateral primary osteoarthritis, left knee 12/17/2018  . Status post total left knee replacement 12/17/2018  . Primary osteoarthritis of left knee 11/23/2018  . Carpal tunnel syndrome, left upper limb 11/23/2018  . BMI 40.0-44.9, adult (HCC) 07/12/2018  . Primary osteoarthritis of both knees 12/24/2016  . 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 DDD (degenerative disc disease), lumbar 11/17/2016  . Vitamin D deficiency 11/17/2016  . History of rosacea/ History of acne 11/17/2016  . High risk medications (not anticoagulants) long-term use 06/25/2016  . History of degenerative disc disease 06/25/2016  . Severe obesity (BMI >= 40) (HCC) 06/13/2014  . Left-sided thoracic back pain 07/19/2013  . PALPITATIONS 10/17/2009  . UTI 05/24/2009  . Rheumatoid arthritis (HCC) 05/24/2009  . Hair loss 02/22/2009  . VAGINAL BLEEDING 10/05/2008  . Carrier of group B Streptococcus 07/28/2007  . GERD 12/28/2006  . CARDIAC MURMUR, HX OF 12/28/2006  . HIATAL HERNIA, HX OF 12/28/2006    Myrene Galas, PT DPT 01/25/2019, 6:31 PM  [place]  [facility] [address], [place], [address] Phone: [phone]   Fax:  [phone]  Name: Donna Lopez MRN: [id number] Date of Birth: [birth year]

## 2019-01-27 ENCOUNTER — Ambulatory Visit: Payer: 59

## 2019-01-27 ENCOUNTER — Encounter: Payer: Self-pay | Admitting: Orthopaedic Surgery

## 2019-01-27 ENCOUNTER — Other Ambulatory Visit: Payer: Self-pay

## 2019-01-27 ENCOUNTER — Ambulatory Visit (INDEPENDENT_AMBULATORY_CARE_PROVIDER_SITE_OTHER): Payer: 59 | Admitting: Orthopaedic Surgery

## 2019-01-27 DIAGNOSIS — M25662 Stiffness of left knee, not elsewhere classified: Secondary | ICD-10-CM

## 2019-01-27 DIAGNOSIS — M25562 Pain in left knee: Secondary | ICD-10-CM | POA: Diagnosis not present

## 2019-01-27 DIAGNOSIS — Z96652 Presence of left artificial knee joint: Secondary | ICD-10-CM

## 2019-01-27 NOTE — Progress Notes (Signed)
The patient is someone well-known to me.  She is 6 weeks out from a left total knee arthroplasty.  She is only 53 years old.  She does have rheumatoid disease and has now been allowed to start back on her rheumatologic medications.  She only takes Tylenol for pain.  She says the best flexion she is gotten in physical therapy is been to about 100 degrees.  She feels like she is making progress.  She has physical therapy through the end of the month.  On examination her left knee is still swollen but there is no evidence of infection.  Her extension is full and her flexion is to about 95 degrees in the office today.  She will continue the therapy to aggressively work on flexing her knee.  She can try Voltaren gel on her knees but also really on her hands where she has osteoarthritis and rheumatologic changes.  She can place vitamin E or her creams on the knee as well to help with her scar.  All question concerns were answered addressed.  We will see her back in 4 weeks to see how she is doing overall but no x-rays are needed.

## 2019-01-27 NOTE — Therapy (Signed)
[facility] [address] [place], [address] Phone: [phone]   Fax:  [phone]  Physical Therapy Treatment  Patient Details  Name: Donna Lopez MRN: [id number] Date of Birth: 05-11-1966 Referring Provider (PT): Doneen Poisson MD   Encounter Date: 01/27/2019  PT End of Session - 01/27/19 1826    Visit Number  8    Number of Visits  13    Date for PT Re-Evaluation  02/15/19    PT Start Time  1700    PT Stop Time  1745    PT Time Calculation (min)  45 min    Activity Tolerance  Patient tolerated treatment well    Behavior During Therapy  [facility] for tasks assessed/performed       Past Medical History:  Diagnosis Date  . Allergy   . Arthritis   . Carpal tunnel syndrome 10/2018  . DDD (degenerative disc disease), lumbar   . DDD (degenerative disc disease), thoracic   . Family history of adverse reaction to anesthesia    Mothers sister never woke up from surgery  . GERD (gastroesophageal reflux disease)   . Grade I diastolic dysfunction 2013  . Heart murmur   . Heart palpitations    History of  . History of hiatal hernia   . OA (osteoarthritis)   . Obese   . Rheumatoid arthritis (HCC)   . 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 Vitamin D deficiency     Past Surgical History:  Procedure Laterality Date  . COLONOSCOPY  2014   DB- normal   . NO PAST SURGERIES    . 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   Procedure: LEFT TOTAL KNEE ARTHROPLASTY;  Surgeon: Kathryne Hitch, MD;  Location: [facility];  Service: Orthopedics;  Laterality: Left;  . UPPER GASTROINTESTINAL ENDOSCOPY      There were no vitals filed for this visit.  Subjective Assessment - 01/27/19 1825    Subjective  Pt presents without AD today, walking without AD at home. Reports seeing MD today discussing manipulation under anesthesia.    Pertinent History  RA, OA, DDD, Obesity    Limitations  Walking    How long can you stand  comfortably?  10 min    How long can you walk comfortably?  10 min    Patient Stated Goals  Improve ROM, dec pain, improve gait mechanics/endurance    Currently in Pain?  Yes    Pain Score  3     Pain Location  Knee    Pain Orientation  Left    Pain Descriptors / Indicators  Aching    Pain Type  Surgical pain    Pain Onset  1 to 4 weeks ago    Multiple Pain Sites  No            Objective Flexion 98 -> 101  TREATMENT Therex Contract/relax for increased left knee flexion ROM x 3 reps Hamstring curls with green theraband 2 x 10 to increase hamstring strength Deadlifts 10# x 10, deferred due to lumbar flexion with patient unable to correct [place] deadlifts over L with UE support for balance x 10 to increase functional hamstring strength Stepups over L with UE support for balance 3 x 8 with verbal cues for increased target muscle activation and decreased compensation through UE Bike x 10 minutes for increase in flexion ROM Lateral stepping with green theraband 16 x 10 ft lengths for dynamic stability of lateral hip musculature  for normalization of gait with verbal cues for foot alignment and joint protection   Theract Instruction in hip hinging for joint protection with deadlift exercise with demonstration, verbal and tactile cues                    PT Education - 01/27/19 1826    Education Details  form/technique with exercise    Person(s) Educated  Patient    Methods  Explanation;Demonstration;Tactile cues;Verbal cues    Comprehension  Verbalized understanding;Returned demonstration;Verbal cues required;Tactile cues required       PT Short Term Goals - 01/05/19 1005      PT SHORT TERM GOAL #1   Title  Pt will be compliant and independent with her HEP.    Time  2    Period  Weeks    Status  New    Target Date  01/19/19        PT Long Term Goals - 01/06/19 1740      PT LONG TERM GOAL #1   Title  Pt will report 0/10 pain at rest and with  activity, demonstrating a significantly improved pain response with mobility.    Baseline  2/10    Time  6    Period  Weeks    Status  New      PT LONG TERM GOAL #2   Title  Pt will demonstrate a 10% reduction on her LEFS to demonstrate a significantly improved overall function and reduction in disability.    Baseline  LEFS deferred to next session 2/2 time, check next session note for score.; 01/06/19 33/80    Time  6    Period  Weeks    Status  New    Target Date  02/17/19      PT LONG TERM GOAL #3   Title  Pt will demonstrate at least 120 degrees of L knee flexion to demonstrate improved ability to navigate stairs and ambulate with improved mechanics and functional ROM.    Baseline  L knee flex 80 deg in seated position (58 deg supine)    Time  6    Period  Weeks    Status  New    Target Date  02/17/19      PT LONG TERM GOAL #4   Title  Pt will demonstrate 0 deg of left knee extension to demonstrate significantly improved L knee ROM, which will allow her to ambulate with improved mechanics and independence.    Baseline  L knee ext lacking 15 deg    Time  6    Period  Weeks    Status  New    Target Date  02/17/19            Plan - 01/27/19 1827    Clinical Impression Statement  Patient increased knee flexion from 98 to 101 degrees within session allowing for increased function with squatting and stairs. Patient shows circumduction on bike due to knee flexion limitations and poor neuromuscular patterning with hip hinge for deadlift. Patient remains limited in strength and AROM. Patient will benefit form additional skilled physical therapy to return to PLOF    Personal Factors and Comorbidities  Comorbidity 1;Comorbidity 3+;Comorbidity 2    Comorbidities  s/p TKA, obesity, OA, RA, DDD    Examination-Activity Limitations  Sleep;Locomotion Level;Stairs;Squat    Examination-Participation Restrictions  Community Activity    Stability/Clinical Decision Making   Stable/Uncomplicated    Rehab Potential  Good    PT Frequency  2x / week    PT Duration  6 weeks    PT Treatment/Interventions  ADLs/Self Care Home Management;Electrical Stimulation;Cryotherapy;Moist Heat;Gait training;Neuromuscular re-education;Stair training;Functional mobility training;Therapeutic activities;Patient/family education;Therapeutic exercise;Balance training;Dry needling;Passive range of motion;Scar mobilization;Manual techniques;Joint Manipulations    PT Next Visit Plan  Assess stairs, gait further. Continue to progress ROM/strengthening exercises and HEP.    PT Home Exercise Plan  See education section.    Consulted and Agree with Plan of Care  Patient       Patient will benefit from skilled therapeutic intervention in order to improve the following deficits and impairments:  Abnormal gait, Decreased range of motion, Difficulty walking, Increased muscle spasms, Decreased endurance, Decreased activity tolerance, Pain, Improper body mechanics  Visit Diagnosis: Stiffness of left knee, not elsewhere classified  Acute pain of left knee     Problem List Patient Active Problem List   Diagnosis Date Noted  . Unilateral primary osteoarthritis, left knee 12/17/2018  . Status post total left knee replacement 12/17/2018  . Primary osteoarthritis of left knee 11/23/2018  . Carpal tunnel syndrome, left upper limb 11/23/2018  . BMI 40.0-44.9, adult (HCC) 07/12/2018  . Primary osteoarthritis of both knees 12/24/2016  . 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 DDD (degenerative disc disease), lumbar 11/17/2016  . Vitamin D deficiency 11/17/2016  . History of rosacea/ History of acne 11/17/2016  . High risk medications (not anticoagulants) long-term use 06/25/2016  . History of degenerative disc disease 06/25/2016  . Severe obesity (BMI >= 40) (HCC) 06/13/2014  . Left-sided thoracic back pain 07/19/2013  . PALPITATIONS 10/17/2009  . UTI 05/24/2009  . Rheumatoid  arthritis (HCC) 05/24/2009  . Hair loss 02/22/2009  . VAGINAL BLEEDING 10/05/2008  . Carrier of group B Streptococcus 07/28/2007  . GERD 12/28/2006  . CARDIAC MURMUR, HX OF 12/28/2006  . HIATAL HERNIA, HX OF 12/28/2006    Myrene Galas 01/27/2019, 6:31 PM  [facility] AND SPORTS MEDICINE [address] [place], [address] Phone: [phone]   Fax:  [phone]  Name: Donna Lopez MRN: [id number] Date of Birth: 1966-01-02

## 2019-02-01 ENCOUNTER — Ambulatory Visit: Payer: 59

## 2019-02-01 ENCOUNTER — Other Ambulatory Visit: Payer: Self-pay

## 2019-02-01 DIAGNOSIS — M25562 Pain in left knee: Secondary | ICD-10-CM

## 2019-02-01 DIAGNOSIS — M25662 Stiffness of left knee, not elsewhere classified: Secondary | ICD-10-CM

## 2019-02-02 ENCOUNTER — Telehealth: Payer: Self-pay | Admitting: Rheumatology

## 2019-02-02 ENCOUNTER — Other Ambulatory Visit: Payer: Self-pay

## 2019-02-02 DIAGNOSIS — Z79899 Other long term (current) drug therapy: Secondary | ICD-10-CM

## 2019-02-02 NOTE — Telephone Encounter (Signed)
Patient was unable to get Humira through [place] with her previous insurance [employer], [employer]). ?  Patient now has new insurance, and wants to know if she can get Humira with [place] now? Patient's new insurance in computer. Please call to discuss with patient.

## 2019-02-02 NOTE — Therapy (Signed)
[facility] [address] [place], [place], [address] Phone: [phone]   Fax:  [phone]  Physical Therapy Treatment  Patient Details  Name: Donna Lopez MRN: [id number] Date of Birth: 1965/09/27 Referring Provider (PT): Doneen PoissonBlackman, Christopher MD   Encounter Date: 02/01/2019  PT End of Session - 02/01/19 1715    Visit Number  9    Number of Visits  13    Date for PT Re-Evaluation  02/15/19    PT Start Time  1700    PT Stop Time  1745    PT Time Calculation (min)  45 min    Activity Tolerance  Patient tolerated treatment well    Behavior During Therapy  [facility]/performed       Past Medical History:  Diagnosis Date  . Allergy   . Arthritis   . Carpal tunnel syndrome 10/2018  . DDD (degenerative disc disease), lumbar   . DDD (degenerative disc disease), thoracic   . Family history of adverse reaction to anesthesia    Mothers sister never woke up from surgery  . GERD (gastroesophageal reflux disease)   . Grade I diastolic dysfunction 2013  . Heart murmur   . Heart palpitations    History of  . History of hiatal hernia   . OA (osteoarthritis)   . Obese   . Rheumatoid arthritis (HCC)   . 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 Vitamin D deficiency     Past Surgical History:  Procedure Laterality Date  . COLONOSCOPY  2014   DB- normal   . NO PAST SURGERIES    . 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   Procedure: LEFT TOTAL KNEE ARTHROPLASTY;  Surgeon: Kathryne HitchBlackman, Christopher Y, MD;  Location: [facility];  Service: Orthopedics;  Laterality: Left;  . UPPER GASTROINTESTINAL ENDOSCOPY      There were no vitals filed for this visit.  Subjective Assessment - 02/01/19 1706    Subjective  Patient states she has been performing her exercises, however, states her knee feels stiff today. Patient reports she is improving overall.    Pertinent History  RA, OA, DDD, Obesity    Limitations  Walking    How long  can you stand comfortably?  10 min    How long can you walk comfortably?  10 min    Patient Stated Goals  Improve ROM, dec pain, improve gait mechanics/endurance    Currently in Pain?  Yes    Pain Score  2     Pain Location  Knee    Pain Orientation  Left    Pain Descriptors / Indicators  Aching    Pain Type  Surgical pain    Pain Onset  1 to 4 weeks ago    Pain Frequency  Constant       TREATMENT Therex Bike x 10 minutes for increase in flexion ROM Step ups over L with UE support for balance with verbal cues for increased target muscle activation and decreased compensation through UE - x20 Squats in standing with foot on 6" step  [place] deadlifts over L with UE support for balance x 10 to increase functional hamstring strength single leg Heel taps off of 6" step - x 10 on L   Performed exercises to improve AROM and strength along the affected side   PT Education - 02/01/19 1715    Education Details  form/technique with exercise    Person(s) Educated  Patient    Methods  Explanation;Demonstration    Comprehension  Verbalized understanding;Returned demonstration       PT Short Term Goals - 01/05/19 1005      PT SHORT TERM GOAL #1   Title  Pt will be compliant and independent with her HEP.    Time  2    Period  Weeks    Status  New    Target Date  01/19/19        PT Long Term Goals - 01/06/19 1740      PT LONG TERM GOAL #1   Title  Pt will report 0/10 pain at rest and with activity, demonstrating a significantly improved pain response with mobility.    Baseline  2/10    Time  6    Period  Weeks    Status  New      PT LONG TERM GOAL #2   Title  Pt will demonstrate a 10% reduction on her LEFS to demonstrate a significantly improved overall function and reduction in disability.    Baseline  LEFS deferred to next session 2/2 time, check next session note for score.; 01/06/19 33/80    Time  6    Period  Weeks    Status  New    Target Date  02/17/19      PT LONG  TERM GOAL #3   Title  Pt will demonstrate at least 120 degrees of L knee flexion to demonstrate improved ability to navigate stairs and ambulate with improved mechanics and functional ROM.    Baseline  L knee flex 80 deg in seated position (58 deg supine)    Time  6    Period  Weeks    Status  New    Target Date  02/17/19      PT LONG TERM GOAL #4   Title  Pt will demonstrate 0 deg of left knee extension to demonstrate significantly improved L knee ROM, which will allow her to ambulate with improved mechanics and independence.    Baseline  L knee ext lacking 15 deg    Time  6    Period  Weeks    Status  New    Target Date  02/17/19            Plan - 02/02/19 1404    Clinical Impression Statement  Patient demonstrates improvement with knee flexion today with ability to acheive 104 degrees at the end of the session. Patient also demonstrates improvement with quad activation  indicating improvement overall. Although patient is improvement she continues to have limitations in AROM and will benefit from further skilled therapy to return to prior level of function.    Personal Factors and Comorbidities  Comorbidity 1;Comorbidity 3+;Comorbidity 2    Comorbidities  s/p TKA, obesity, OA, RA, DDD    Examination-Activity Limitations  Sleep;Locomotion Level;Stairs;Squat    Examination-Participation Restrictions  Community Activity    Stability/Clinical Decision Making  Stable/Uncomplicated    Rehab Potential  Good    PT Frequency  2x / week    PT Duration  6 weeks    PT Treatment/Interventions  ADLs/Self Care Home Management;Electrical Stimulation;Cryotherapy;Moist Heat;Gait training;Neuromuscular re-education;Stair training;Functional mobility training;Therapeutic activities;Patient/family education;Therapeutic exercise;Balance training;Dry needling;Passive range of motion;Scar mobilization;Manual techniques;Joint Manipulations    PT Next Visit Plan  Assess stairs, gait further. Continue to  progress ROM/strengthening exercises and HEP.    PT Home Exercise Plan  See education section.    Consulted and Agree with Plan of Care  Patient  Patient will benefit from skilled therapeutic intervention in order to improve the following deficits and impairments:  Abnormal gait, Decreased range of motion, Difficulty walking, Increased muscle spasms, Decreased endurance, Decreased activity tolerance, Pain, Improper body mechanics  Visit Diagnosis: Stiffness of left knee, not elsewhere classified  Acute pain of left knee     Problem List Patient Active Problem List   Diagnosis Date Noted  . Unilateral primary osteoarthritis, left knee 12/17/2018  . Status post total left knee replacement 12/17/2018  . Primary osteoarthritis of left knee 11/23/2018  . Carpal tunnel syndrome, left upper limb 11/23/2018  . BMI 40.0-44.9, adult ([place]) 07/12/2018  . Primary osteoarthritis of both knees 12/24/2016  . 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 DDD (degenerative disc disease), lumbar 11/17/2016  . Vitamin D deficiency 11/17/2016  . History of rosacea/ History of acne 11/17/2016  . High risk medications (not anticoagulants) long-term use 06/25/2016  . History of degenerative disc disease 06/25/2016  . Severe obesity (BMI >= 40) ([place]) 06/13/2014  . Left-sided thoracic back pain 07/19/2013  . PALPITATIONS 10/17/2009  . UTI 05/24/2009  . Rheumatoid arthritis ([place]) 05/24/2009  . Hair loss 02/22/2009  . VAGINAL BLEEDING 10/05/2008  . Carrier of group B Streptococcus 07/28/2007  . GERD 12/28/2006  . CARDIAC MURMUR, HX OF 12/28/2006  . HIATAL HERNIA, HX OF 12/28/2006    Blythe Stanford, PT DPT 02/02/2019, 2:07 PM  [facility] [address] [place], [address] Phone: [phone]   Fax:  [phone]  Name: Donna Lopez MRN: [id number] Date of Birth: Aug 30, 1965

## 2019-02-03 ENCOUNTER — Other Ambulatory Visit: Payer: Self-pay

## 2019-02-03 ENCOUNTER — Ambulatory Visit: Payer: 59

## 2019-02-03 DIAGNOSIS — M25562 Pain in left knee: Secondary | ICD-10-CM

## 2019-02-03 DIAGNOSIS — M25662 Stiffness of left knee, not elsewhere classified: Secondary | ICD-10-CM

## 2019-02-03 LAB — COMPLETE METABOLIC PANEL WITH GFR
AG Ratio: 1.2 (calc) (ref 1.0–2.5)
ALT: 17 U/L (ref 6–29)
AST: 15 U/L (ref 10–35)
Albumin: 3.8 g/dL (ref 3.6–5.1)
Alkaline phosphatase (APISO): 78 U/L (ref 37–153)
BUN: 22 mg/dL (ref 7–25)
CO2: 26 mmol/L (ref 20–32)
Calcium: 9.1 mg/dL (ref 8.6–10.4)
Chloride: 105 mmol/L (ref 98–110)
Creat: 0.61 mg/dL (ref 0.50–1.05)
GFR, Est African American: 119 mL/min/{1.73_m2} (ref >=60)
GFR, Est Non African American: 103 mL/min/{1.73_m2} (ref >=60)
Globulin: 3.1 g/dL (calc) (ref 1.9–3.7)
Glucose, Bld: 95 mg/dL (ref 65–99)
Potassium: 4.2 mmol/L (ref 3.5–5.3)
Sodium: 141 mmol/L (ref 135–146)
Total Bilirubin: 0.3 mg/dL (ref 0.2–1.2)
Total Protein: 6.9 g/dL (ref 6.1–8.1)

## 2019-02-03 LAB — CBC WITH DIFFERENTIAL/PLATELET
Absolute Monocytes: 837 cells/uL (ref 200–950)
Basophils Absolute: 63 cells/uL (ref 0–200)
Basophils Relative: 0.7 %
Eosinophils Absolute: 324 cells/uL (ref 15–500)
Eosinophils Relative: 3.6 %
HCT: 38.2 % (ref 35.0–45.0)
Hemoglobin: 12.5 g/dL (ref 11.7–15.5)
Lymphs Abs: 2511 cells/uL (ref 850–3900)
MCH: 29.3 pg (ref 27.0–33.0)
MCHC: 32.7 g/dL (ref 32.0–36.0)
MCV: 89.5 fL (ref 80.0–100.0)
MPV: 10.2 fL (ref 7.5–12.5)
Monocytes Relative: 9.3 %
Neutro Abs: 5265 cells/uL (ref 1500–7800)
Neutrophils Relative %: 58.5 %
Platelets: 300 10*3/uL (ref 140–400)
RBC: 4.27 10*6/uL (ref 3.80–5.10)
RDW: 15.1 % — ABNORMAL HIGH (ref 11.0–15.0)
Total Lymphocyte: 27.9 %
WBC: 9 10*3/uL (ref 3.8–10.8)

## 2019-02-03 MED ORDER — HUMIRA (2 PEN) 40 MG/0.4ML ~~LOC~~ AJKT: 40.0000 mg | AUTO-INJECTOR | SUBCUTANEOUS | 0 refills | Status: DC

## 2019-02-03 NOTE — Telephone Encounter (Signed)
Submitted a Prior Authorization request to [facility] for [place] via Cover My Meds. Will update once we receive a response.  8:53 AM Beatriz Chancellor, CPhT

## 2019-02-03 NOTE — Telephone Encounter (Signed)
Spoke with patient and she states she did not receive the prescription that was sent to the pharmacy in August. Patient states she had an insurance change at that time.   Last visit: 11/15/18 Next Visit: 04/18/19 Labs: 02/02/19 CMP WNL. RDW borderline elevated but trending down. Rest of CBC WNL. Tb Gold: 10/28/18

## 2019-02-03 NOTE — Telephone Encounter (Signed)
Attempted to contact the patient and left message for patient to call the office. Too early to refill prescription. Refill sent in 01/14/19 for 90 day supply.

## 2019-02-03 NOTE — Therapy (Signed)
[facility] [address] [place], [address] Phone: [phone]   Fax:  [phone]  Physical Therapy Treatment  Patient Details  Name: Donna Lopez MRN: [id number] Date of Birth: 12/26/1965 Referring Provider (PT): Doneen Poisson MD   Encounter Date: 02/03/2019  PT End of Session - 02/03/19 1715    Visit Number  10    Number of Visits  13    Date for PT Re-Evaluation  02/15/19    PT Start Time  1700    PT Stop Time  1745    PT Time Calculation (min)  45 min    Activity Tolerance  Patient tolerated treatment well    Behavior During Therapy  [facility] for tasks assessed/performed       Past Medical History:  Diagnosis Date  . Allergy   . Arthritis   . Carpal tunnel syndrome 10/2018  . DDD (degenerative disc disease), lumbar   . DDD (degenerative disc disease), thoracic   . Family history of adverse reaction to anesthesia    Mothers sister never woke up from surgery  . GERD (gastroesophageal reflux disease)   . Grade I diastolic dysfunction 2013  . Heart murmur   . Heart palpitations    History of  . History of hiatal hernia   . OA (osteoarthritis)   . Obese   . Rheumatoid arthritis (HCC)   . 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 Vitamin D deficiency     Past Surgical History:  Procedure Laterality Date  . COLONOSCOPY  2014   DB- normal   . NO PAST SURGERIES    . 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   Procedure: LEFT TOTAL KNEE ARTHROPLASTY;  Surgeon: Kathryne Hitch, MD;  Location: [facility];  Service: Orthopedics;  Laterality: Left;  . UPPER GASTROINTESTINAL ENDOSCOPY      There were no vitals filed for this visit.  Subjective Assessment - 02/03/19 1710    Subjective  Patient states she is improving overall but reports no increased stiffness today. Patient reports she continues to work towards improving her knee AROM.    Pertinent History  RA, OA, DDD, Obesity    Limitations  Walking     How long can you stand comfortably?  10 min    How long can you walk comfortably?  10 min    Patient Stated Goals  Improve ROM, dec pain, improve gait mechanics/endurance    Currently in Pain?  Yes    Pain Score  2     Pain Location  Knee    Pain Orientation  Left    Pain Descriptors / Indicators  Aching    Pain Type  Surgical pain    Pain Onset  1 to 4 weeks ago    Pain Frequency  Constant             TREATMENT Therex Bike x 10 minutes for increase in flexion ROM level 7; level: 6  Lunges forward - x 20 B  Standing knee flexion with Towel assistance at end range - x 20  Forward lunges with foot on step - x 20  Wall squats with physioball - x 20  Leg Press in sitting - x20 with 75# ; x 20 with solo L LE #55 Heel taps off of 6" step - x 10 on L    Performed exercises to improve AROM and strength along the affected side    PT Education - 02/03/19 1714  Education Details  form/technique with exercise    Person(s) Educated  Patient    Methods  Explanation;Demonstration    Comprehension  Verbalized understanding;Returned demonstration       PT Short Term Goals - 01/05/19 1005      PT SHORT TERM GOAL #1   Title  Pt will be compliant and independent with her HEP.    Time  2    Period  Weeks    Status  New    Target Date  01/19/19        PT Long Term Goals - 01/06/19 1740      PT LONG TERM GOAL #1   Title  Pt will report 0/10 pain at rest and with activity, demonstrating a significantly improved pain response with mobility.    Baseline  2/10    Time  6    Period  Weeks    Status  New      PT LONG TERM GOAL #2   Title  Pt will demonstrate a 10% reduction on her LEFS to demonstrate a significantly improved overall function and reduction in disability.    Baseline  LEFS deferred to next session 2/2 time, check next session note for score.; 01/06/19 33/80    Time  6    Period  Weeks    Status  New    Target Date  02/17/19      PT LONG TERM GOAL #3    Title  Pt will demonstrate at least 120 degrees of L knee flexion to demonstrate improved ability to navigate stairs and ambulate with improved mechanics and functional ROM.    Baseline  L knee flex 80 deg in seated position (58 deg supine)    Time  6    Period  Weeks    Status  New    Target Date  02/17/19      PT LONG TERM GOAL #4   Title  Pt will demonstrate 0 deg of left knee extension to demonstrate significantly improved L knee ROM, which will allow her to ambulate with improved mechanics and independence.    Baseline  L knee ext lacking 15 deg    Time  6    Period  Weeks    Status  New    Target Date  02/17/19            Plan - 02/03/19 1747    Clinical Impression Statement  Patient started the session at 98degrees of PROM with 90 degrees of AROM. At the end of the session she's able to perform 98deg of AROM and 109 degrees of PROM. Patient is improving considerably able to perform closer positioning on the bike and tolerate higher intensity exercises compared to previous sessions. Will continue to address quad/hip strength with exercises and improve AROM to return to prior level of function.    Personal Factors and Comorbidities  Comorbidity 1;Comorbidity 3+;Comorbidity 2    Comorbidities  s/p TKA, obesity, OA, RA, DDD    Examination-Activity Limitations  Sleep;Locomotion Level;Stairs;Squat    Examination-Participation Restrictions  Community Activity    Stability/Clinical Decision Making  Stable/Uncomplicated    Rehab Potential  Good    PT Frequency  2x / week    PT Duration  6 weeks    PT Treatment/Interventions  ADLs/Self Care Home Management;Electrical Stimulation;Cryotherapy;Moist Heat;Gait training;Neuromuscular re-education;Stair training;Functional mobility training;Therapeutic activities;Patient/family education;Therapeutic exercise;Balance training;Dry needling;Passive range of motion;Scar mobilization;Manual techniques;Joint Manipulations    PT Next Visit Plan   Assess stairs, gait further.  Continue to progress ROM/strengthening exercises and HEP.    PT Home Exercise Plan  See education section.    Consulted and Agree with Plan of Care  Patient       Patient will benefit from skilled therapeutic intervention in order to improve the following deficits and impairments:  Abnormal gait, Decreased range of motion, Difficulty walking, Increased muscle spasms, Decreased endurance, Decreased activity tolerance, Pain, Improper body mechanics  Visit Diagnosis: Stiffness of left knee, not elsewhere classified  Acute pain of left knee     Problem List Patient Active Problem List   Diagnosis Date Noted  . Unilateral primary osteoarthritis, left knee 12/17/2018  . Status post total left knee replacement 12/17/2018  . Primary osteoarthritis of left knee 11/23/2018  . Carpal tunnel syndrome, left upper limb 11/23/2018  . BMI 40.0-44.9, adult ([place]) 07/12/2018  . Primary osteoarthritis of both knees 12/24/2016  . 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 DDD (degenerative disc disease), lumbar 11/17/2016  . Vitamin D deficiency 11/17/2016  . History of rosacea/ History of acne 11/17/2016  . High risk medications (not anticoagulants) long-term use 06/25/2016  . History of degenerative disc disease 06/25/2016  . Severe obesity (BMI >= 40) ([place]) 06/13/2014  . Left-sided thoracic back pain 07/19/2013  . PALPITATIONS 10/17/2009  . UTI 05/24/2009  . Rheumatoid arthritis ([place]) 05/24/2009  . Hair loss 02/22/2009  . VAGINAL BLEEDING 10/05/2008  . Carrier of group B Streptococcus 07/28/2007  . GERD 12/28/2006  . CARDIAC MURMUR, HX OF 12/28/2006  . HIATAL HERNIA, HX OF 12/28/2006    Blythe Stanford 02/03/2019, 5:51 PM  [facility] [address], [place], [address] Phone: [phone]   Fax:  [phone]  Name: Donna Lopez MRN: [id number] Date of Birth:  10-09-1965

## 2019-02-03 NOTE — Telephone Encounter (Signed)
Received notification from [facility] regarding a prior authorization for [place]. Authorization has been APPROVED from 02/03/19 to 02/03/20.   Will send document to scan center.  Authorization # [id number] Phone # [phone]  Per patient plan, Patient must fill through [facility]. Called patient and advised.  Please send prescription to [facility].  9:59 AM Beatriz Chancellor, CPhT

## 2019-02-03 NOTE — Addendum Note (Signed)
Addended by: Carole Binning on: 02/03/2019 04:45 PM   Modules accepted: Orders

## 2019-02-03 NOTE — Progress Notes (Signed)
CMP WNL.  RDW borderline elevated but trending down.  Rest of CBC WNL.

## 2019-02-08 ENCOUNTER — Other Ambulatory Visit: Payer: Self-pay

## 2019-02-08 ENCOUNTER — Ambulatory Visit: Payer: 59

## 2019-02-08 DIAGNOSIS — M25562 Pain in left knee: Secondary | ICD-10-CM | POA: Diagnosis not present

## 2019-02-08 DIAGNOSIS — M25662 Stiffness of left knee, not elsewhere classified: Secondary | ICD-10-CM

## 2019-02-09 NOTE — Therapy (Signed)
[facility] [address] [place], [address] Phone: [phone]   Fax:  [phone]  Physical Therapy Treatment  Patient Details  Name: NASHIYA DISBROW MRN: [id number] Date of Birth: Jul 27, 1965 Referring Provider (PT): Jean Rosenthal MD   Encounter Date: 02/08/2019  PT End of Session - 02/09/19 1200    Visit Number  11    Number of Visits  13    Date for PT Re-Evaluation  02/15/19    PT Start Time  1700    PT Stop Time  1745    PT Time Calculation (min)  45 min    Activity Tolerance  Patient tolerated treatment well    Behavior During Therapy  [facility] for tasks assessed/performed       Past Medical History:  Diagnosis Date  . Allergy   . Arthritis   . Carpal tunnel syndrome 10/2018  . DDD (degenerative disc disease), lumbar   . DDD (degenerative disc disease), thoracic   . Family history of adverse reaction to anesthesia    Mothers sister never woke up from surgery  . GERD (gastroesophageal reflux disease)   . Grade I diastolic dysfunction 8841  . Heart murmur   . Heart palpitations    History of  . History of hiatal hernia   . OA (osteoarthritis)   . Obese   . Rheumatoid arthritis ([place])   . 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 Vitamin D deficiency     Past Surgical History:  Procedure Laterality Date  . COLONOSCOPY  2014   DB- normal   . NO PAST SURGERIES    . 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   Procedure: LEFT TOTAL KNEE ARTHROPLASTY;  Surgeon: Mcarthur Rossetti, MD;  Location: [facility];  Service: Orthopedics;  Laterality: Left;  . UPPER GASTROINTESTINAL ENDOSCOPY      There were no vitals filed for this visit.  Subjective Assessment - 02/08/19 1729    Subjective  Patinet reports she has been able to move her knee easier compared to previous sessions.    Pertinent History  RA, OA, DDD, Obesity    Limitations  Walking    How long can you stand comfortably?  10 min    How long can  you walk comfortably?  10 min    Patient Stated Goals  Improve ROM, dec pain, improve gait mechanics/endurance    Currently in Pain?  No/denies    Pain Onset  1 to 4 weeks ago        TREATMENT Therex Bike x 10 minutes for increase in flexion ROM  level: 6  Knee flexion with foot on a step -- x 10 Seated knee flexion in sitting with black theraband -- x 20 Sit to stand with affect side pushed behind and unaffected side pushed forward Leg Press in sitting - x20 with 75# ; x 20 with solo L LE #55 -- will do NV   Performed exercises to improve AROM and strength along the affected side     PT Education - 02/09/19 1200    Education Details  form/technique with exercise    Person(s) Educated  Patient    Methods  Explanation;Demonstration    Comprehension  Verbalized understanding;Returned demonstration       PT Short Term Goals - 01/05/19 1005      PT SHORT TERM GOAL #1   Title  Pt will be compliant and independent with her HEP.    Time  2  Period  Weeks    Status  New    Target Date  01/19/19        PT Long Term Goals - 01/06/19 1740      PT LONG TERM GOAL #1   Title  Pt will report 0/10 pain at rest and with activity, demonstrating a significantly improved pain response with mobility.    Baseline  2/10    Time  6    Period  Weeks    Status  New      PT LONG TERM GOAL #2   Title  Pt will demonstrate a 10% reduction on her LEFS to demonstrate a significantly improved overall function and reduction in disability.    Baseline  LEFS deferred to next session 2/2 time, check next session note for score.; 01/06/19 33/80    Time  6    Period  Weeks    Status  New    Target Date  02/17/19      PT LONG TERM GOAL #3   Title  Pt will demonstrate at least 120 degrees of L knee flexion to demonstrate improved ability to navigate stairs and ambulate with improved mechanics and functional ROM.    Baseline  L knee flex 80 deg in seated position (58 deg supine)    Time  6     Period  Weeks    Status  New    Target Date  02/17/19      PT LONG TERM GOAL #4   Title  Pt will demonstrate 0 deg of left knee extension to demonstrate significantly improved L knee ROM, which will allow her to ambulate with improved mechanics and independence.    Baseline  L knee ext lacking 15 deg    Time  6    Period  Weeks    Status  New    Target Date  02/17/19            Plan - 02/09/19 1201    Clinical Impression Statement  Patient starts session with knee at 107 degrees of PROM and is able to perform greater amount of AROM in sitting. Patient able to achieve 110 degrees of knee flexion at the end of the session. Patient demonstrates improvement with exercise performance with ability to perform greater amount of exercises through a greater overall AROM. Patient is improving overall but continues to have limitations in knee flexion. Patient will benefit from further skilled therapy to return to prior level of function.    Personal Factors and Comorbidities  Comorbidity 1;Comorbidity 3+;Comorbidity 2    Comorbidities  s/p TKA, obesity, OA, RA, DDD    Examination-Activity Limitations  Sleep;Locomotion Level;Stairs;Squat    Examination-Participation Restrictions  Community Activity    Stability/Clinical Decision Making  Stable/Uncomplicated    Rehab Potential  Good    PT Frequency  2x / week    PT Duration  6 weeks    PT Treatment/Interventions  ADLs/Self Care Home Management;Electrical Stimulation;Cryotherapy;Moist Heat;Gait training;Neuromuscular re-education;Stair training;Functional mobility training;Therapeutic activities;Patient/family education;Therapeutic exercise;Balance training;Dry needling;Passive range of motion;Scar mobilization;Manual techniques;Joint Manipulations    PT Next Visit Plan  Assess stairs, gait further. Continue to progress ROM/strengthening exercises and HEP.    PT Home Exercise Plan  See education section.    Consulted and Agree with Plan of Care   Patient       Patient will benefit from skilled therapeutic intervention in order to improve the following deficits and impairments:  Abnormal gait, Decreased range of motion, Difficulty walking,  Increased muscle spasms, Decreased endurance, Decreased activity tolerance, Pain, Improper body mechanics  Visit Diagnosis: Stiffness of left knee, not elsewhere classified  Acute pain of left knee     Problem List Patient Active Problem List   Diagnosis Date Noted  . Unilateral primary osteoarthritis, left knee 12/17/2018  . Status post total left knee replacement 12/17/2018  . Primary osteoarthritis of left knee 11/23/2018  . Carpal tunnel syndrome, left upper limb 11/23/2018  . BMI 40.0-44.9, adult (HCC) 07/12/2018  . Primary osteoarthritis of both knees 12/24/2016  . 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 DDD (degenerative disc disease), lumbar 11/17/2016  . Vitamin D deficiency 11/17/2016  . History of rosacea/ History of acne 11/17/2016  . High risk medications (not anticoagulants) long-term use 06/25/2016  . History of degenerative disc disease 06/25/2016  . Severe obesity (BMI >= 40) (HCC) 06/13/2014  . Left-sided thoracic back pain 07/19/2013  . PALPITATIONS 10/17/2009  . UTI 05/24/2009  . Rheumatoid arthritis (HCC) 05/24/2009  . Hair loss 02/22/2009  . VAGINAL BLEEDING 10/05/2008  . Carrier of group B Streptococcus 07/28/2007  . GERD 12/28/2006  . CARDIAC MURMUR, HX OF 12/28/2006  . HIATAL HERNIA, HX OF 12/28/2006    Myrene GalasWesley Cambria Osten, PT DPT 02/09/2019, 12:04 PM  [facility] [address] [place], [place], [address] Phone: [phone]   Fax:  [phone]  Name: Dallas SchimkeJulie S Kneip MRN: [id number] Date of Birth: 06-Apr-1966

## 2019-02-10 ENCOUNTER — Other Ambulatory Visit: Payer: Self-pay

## 2019-02-10 ENCOUNTER — Ambulatory Visit: Payer: 59

## 2019-02-10 DIAGNOSIS — M25562 Pain in left knee: Secondary | ICD-10-CM

## 2019-02-10 DIAGNOSIS — M25662 Stiffness of left knee, not elsewhere classified: Secondary | ICD-10-CM

## 2019-02-10 NOTE — Therapy (Signed)
[facility] [address] [place], [address] Phone: [phone]   Fax:  [phone]  Physical Therapy Treatment  Patient Details  Name: Donna Lopez MRN: [id number] Date of Birth: 06-03-1965 Referring Provider (PT): Doneen Poisson MD   Encounter Date: 02/10/2019  PT End of Session - 02/10/19 1717    Visit Number  12    Number of Visits  13    Date for PT Re-Evaluation  02/15/19    PT Start Time  1700    PT Stop Time  1745    PT Time Calculation (min)  45 min    Activity Tolerance  Patient tolerated treatment well    Behavior During Therapy  [facility] for tasks assessed/performed       Past Medical History:  Diagnosis Date  . Allergy   . Arthritis   . Carpal tunnel syndrome 10/2018  . DDD (degenerative disc disease), lumbar   . DDD (degenerative disc disease), thoracic   . Family history of adverse reaction to anesthesia    Mothers sister never woke up from surgery  . GERD (gastroesophageal reflux disease)   . Grade I diastolic dysfunction 2013  . Heart murmur   . Heart palpitations    History of  . History of hiatal hernia   . OA (osteoarthritis)   . Obese   . Rheumatoid arthritis (HCC)   . 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 Vitamin D deficiency     Past Surgical History:  Procedure Laterality Date  . COLONOSCOPY  2014   DB- normal   . NO PAST SURGERIES    . 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   Procedure: LEFT TOTAL KNEE ARTHROPLASTY;  Surgeon: Kathryne Hitch, MD;  Location: [facility];  Service: Orthopedics;  Laterality: Left;  . UPPER GASTROINTESTINAL ENDOSCOPY      There were no vitals filed for this visit.  Subjective Assessment - 02/10/19 1705    Subjective  Patient reports she is improving but doesn't know if she's able to bend it anymore compared to the previous session.    Pertinent History  RA, OA, DDD, Obesity    Limitations  Walking    How long can you stand  comfortably?  10 min    How long can you walk comfortably?  10 min    Patient Stated Goals  Improve ROM, dec pain, improve gait mechanics/endurance    Currently in Pain?  No/denies    Pain Onset  1 to 4 weeks ago               TREATMENT Therex Bike x 10 minutes for increase in flexion ROM  level: 5 Foot on step lunges - x 20 Seated ball rolls outs - x 20  Heel taps off of 6" - x20  Squats with foot on 6" step - x 20 Lunges in standing - x 20   Leg Press in sitting - x20 with 105# ; x 20 with solo L LE #55 - x20     Performed exercises to improve AROM and strength along the affected side    Able to achieve 110degrees at the end of the session through PROM   PT Education - 02/10/19 1716    Education Details  form/technique with exercise    Person(s) Educated  Patient    Methods  Explanation;Demonstration    Comprehension  Verbalized understanding;Returned demonstration       PT Short Term Goals - 01/05/19 1005  PT SHORT TERM GOAL #1   Title  Pt will be compliant and independent with her HEP.    Time  2    Period  Weeks    Status  New    Target Date  01/19/19        PT Long Term Goals - 01/06/19 1740      PT LONG TERM GOAL #1   Title  Pt will report 0/10 pain at rest and with activity, demonstrating a significantly improved pain response with mobility.    Baseline  2/10    Time  6    Period  Weeks    Status  New      PT LONG TERM GOAL #2   Title  Pt will demonstrate a 10% reduction on her LEFS to demonstrate a significantly improved overall function and reduction in disability.    Baseline  LEFS deferred to next session 2/2 time, check next session note for score.; 01/06/19 33/80    Time  6    Period  Weeks    Status  New    Target Date  02/17/19      PT LONG TERM GOAL #3   Title  Pt will demonstrate at least 120 degrees of L knee flexion to demonstrate improved ability to navigate stairs and ambulate with improved mechanics and functional ROM.     Baseline  L knee flex 80 deg in seated position (58 deg supine)    Time  6    Period  Weeks    Status  New    Target Date  02/17/19      PT LONG TERM GOAL #4   Title  Pt will demonstrate 0 deg of left knee extension to demonstrate significantly improved L knee ROM, which will allow her to ambulate with improved mechanics and independence.    Baseline  L knee ext lacking 15 deg    Time  6    Period  Weeks    Status  New    Target Date  02/17/19            Plan - 02/10/19 1717    Clinical Impression Statement  Patient demonstrates improvement with exercise performance with ability to perform exercises throughout a greater AROM compared to previous sessions. Although patient is improving, she continues to have limitations with knee flexion and will benefit from further skilled therapy to return to prior level of function.    Personal Factors and Comorbidities  Comorbidity 1;Comorbidity 3+;Comorbidity 2    Comorbidities  s/p TKA, obesity, OA, RA, DDD    Examination-Activity Limitations  Sleep;Locomotion Level;Stairs;Squat    Examination-Participation Restrictions  Community Activity    Stability/Clinical Decision Making  Stable/Uncomplicated    Rehab Potential  Good    PT Frequency  2x / week    PT Duration  6 weeks    PT Treatment/Interventions  ADLs/Self Care Home Management;Electrical Stimulation;Cryotherapy;Moist Heat;Gait training;Neuromuscular re-education;Stair training;Functional mobility training;Therapeutic activities;Patient/family education;Therapeutic exercise;Balance training;Dry needling;Passive range of motion;Scar mobilization;Manual techniques;Joint Manipulations    PT Next Visit Plan  Assess stairs, gait further. Continue to progress ROM/strengthening exercises and HEP.    PT Home Exercise Plan  See education section.    Consulted and Agree with Plan of Care  Patient       Patient will benefit from skilled therapeutic intervention in order to improve the  following deficits and impairments:  Abnormal gait, Decreased range of motion, Difficulty walking, Increased muscle spasms, Decreased endurance, Decreased activity tolerance, Pain,  Improper body mechanics  Visit Diagnosis: Stiffness of left knee, not elsewhere classified  Acute pain of left knee     Problem List Patient Active Problem List   Diagnosis Date Noted  . Unilateral primary osteoarthritis, left knee 12/17/2018  . Status post total left knee replacement 12/17/2018  . Primary osteoarthritis of left knee 11/23/2018  . Carpal tunnel syndrome, left upper limb 11/23/2018  . BMI 40.0-44.9, adult (HCC) 07/12/2018  . Primary osteoarthritis of both knees 12/24/2016  . 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 DDD (degenerative disc disease), lumbar 11/17/2016  . Vitamin D deficiency 11/17/2016  . History of rosacea/ History of acne 11/17/2016  . High risk medications (not anticoagulants) long-term use 06/25/2016  . History of degenerative disc disease 06/25/2016  . Severe obesity (BMI >= 40) (HCC) 06/13/2014  . Left-sided thoracic back pain 07/19/2013  . PALPITATIONS 10/17/2009  . UTI 05/24/2009  . Rheumatoid arthritis (HCC) 05/24/2009  . Hair loss 02/22/2009  . VAGINAL BLEEDING 10/05/2008  . Carrier of group B Streptococcus 07/28/2007  . GERD 12/28/2006  . CARDIAC MURMUR, HX OF 12/28/2006  . HIATAL HERNIA, HX OF 12/28/2006    Myrene GalasWesley Sharda Keddy, PT DPT 02/10/2019, 5:41 PM  [facility] [address] [place], [place], [address] Phone: [phone]   Fax:  [phone]  Name: Donna Lopez MRN: [id number] Date of Birth: July 14, 1965

## 2019-02-11 NOTE — Progress Notes (Signed)
Office Visit Note  Patient: Donna Lopez             Date of Birth: 06-Feb-1966           MRN: [id number]             PCP: [name], DO Referring: [name], * Visit Date: 02/14/2019 Occupation: @GUAROCC @  Subjective:  Pain in multiple joints   History of Present Illness: Donna Lopez is a 53 y.o. female with history of seropositive rheumatoid arthritis, osteoarthritis, and DDD.  She presents today with increased pain and swelling in both hands and both wrist joints.  She is also been having increased neck pain and stiffness.  She has been having significant nocturnal pain which is been worsening her fatigue.  She has been wearing wrist braces at night.   She states that she was off of Humira for 5 weeks and methotrexate for 3 weeks due to having a left knee replacement performed by Dr. Ninfa Linden on 12/17/2018.  She has been back on both medications for about 6 weeks.  She continues to go to physical therapy and has been progressing well.  She denies any nocturnal pain in the left knee joint at this time.  She reports that about 1 month ago she was prescribed a prednisone taper for a RA flare, which helped temporarily.  She has been taking naproxen and Tylenol on a daily basis due to the increased pain and swelling in her hands and wrist joints.     Activities of Daily Living:  Patient reports joint stiffness all day  Patient Reports nocturnal pain.  Difficulty dressing/grooming: Reports Difficulty climbing stairs: Denies Difficulty getting out of chair: Denies Difficulty using hands for taps, buttons, cutlery, and/or writing: Reports  Review of Systems  Constitutional: Positive for fatigue.  HENT: Negative for mouth sores, mouth dryness and nose dryness.   Eyes: Negative for pain, itching, visual disturbance and dryness.  Respiratory: Negative for cough, hemoptysis, shortness of breath, wheezing and difficulty breathing.   Cardiovascular: Negative for  chest pain, palpitations, hypertension and swelling in legs/feet.  Gastrointestinal: Negative for abdominal pain, blood in stool, constipation and diarrhea.  Endocrine: Negative for increased urination.  Genitourinary: Negative for difficulty urinating and painful urination.  Musculoskeletal: Positive for arthralgias, joint pain, joint swelling and morning stiffness. Negative for myalgias, muscle weakness, muscle tenderness and myalgias.  Skin: Negative for color change, pallor, rash, hair loss, nodules/bumps, skin tightness, ulcers and sensitivity to sunlight.  Allergic/Immunologic: Negative for susceptible to infections.  Neurological: Negative for dizziness, numbness, headaches, memory loss and weakness.  Hematological: Negative for bruising/bleeding tendency and swollen glands.  Psychiatric/Behavioral: Positive for sleep disturbance. Negative for depressed mood and confusion. The patient is not nervous/anxious.     PMFS History:  Patient Active Problem List   Diagnosis Date Noted   Unilateral primary osteoarthritis, left knee 12/17/2018   Status post total left knee replacement 12/17/2018   Primary osteoarthritis of left knee 11/23/2018   Carpal tunnel syndrome, left upper limb 11/23/2018   BMI 40.0-44.9, adult ([place]) 07/12/2018   Primary osteoarthritis of both knees 12/24/2016   DDD (degenerative disc disease), thoracic 11/17/2016   DDD (degenerative disc disease), lumbar 11/17/2016   Vitamin D deficiency 11/17/2016   History of rosacea/ History of acne 11/17/2016   High risk medications (not anticoagulants) long-term use 06/25/2016   History of degenerative disc disease 06/25/2016   Severe obesity (BMI >= 40) ([place]) 06/13/2014   Left-sided  thoracic back pain 07/19/2013   PALPITATIONS 10/17/2009   UTI 05/24/2009   Rheumatoid arthritis (HCC) 05/24/2009   Hair loss 02/22/2009   VAGINAL BLEEDING 10/05/2008   Carrier of group B Streptococcus 07/28/2007   GERD  12/28/2006   CARDIAC MURMUR, HX OF 12/28/2006   HIATAL HERNIA, HX OF 12/28/2006    Past Medical History:  Diagnosis Date   Allergy    Arthritis    Carpal tunnel syndrome 10/2018   DDD (degenerative disc disease), lumbar    DDD (degenerative disc disease), thoracic    Family history of adverse reaction to anesthesia    Mothers sister never woke up from surgery   GERD (gastroesophageal reflux disease)    Grade I diastolic dysfunction 2013   Heart murmur    Heart palpitations    History of   History of hiatal hernia    OA (osteoarthritis)    Obese    Rheumatoid arthritis (HCC)    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    Vitamin D deficiency     Family History  Problem Relation Age of Onset   Uterine cancer Mother    Diabetes Father    Kidney cancer Father    Colon cancer Sister [id number]   Rectal cancer Sister    Colon cancer Paternal Grandfather    Colon polyps Brother    Cancer Sister        breast    Healthy Daughter    Esophageal cancer Neg Hx    Stomach cancer Neg Hx    Past Surgical History:  Procedure Laterality Date   COLONOSCOPY  2014   DB- normal    NO PAST SURGERIES     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   Procedure: LEFT TOTAL KNEE ARTHROPLASTY;  Surgeon: Kathryne HitchBlackman, Christopher Y, MD;  Location: [facility];  Service: Orthopedics;  Laterality: Left;   UPPER GASTROINTESTINAL ENDOSCOPY     Social History   Social History Narrative   Exercise--- sometimes walks 1x a week   Immunization History  Administered Date(s) Administered   Influenza Whole 02/08/2009   Influenza,inj,Quad PF,6+ Mos 03/09/2013, 06/13/2014, 06/16/2016   Td 09/29/2001   Tdap 06/13/2014     Objective: Vital Signs: BP (!) 153/96 (BP Location: Left Arm, Patient Position: Sitting, Cuff Size: Normal)    Pulse 90    Resp 13    Ht 5' 2.5" (1.588 m)    Wt 227 lb (103 kg)    BMI 40.86 kg/m    Physical Exam Vitals signs and nursing note reviewed.  Constitutional:       Appearance: She is well-developed.  HENT:     Head: Normocephalic and atraumatic.  Eyes:     Conjunctiva/sclera: Conjunctivae normal.  Neck:     Musculoskeletal: Normal range of motion.  Cardiovascular:     Rate and Rhythm: Normal rate and regular rhythm.     Heart sounds: Normal heart sounds.  Pulmonary:     Effort: Pulmonary effort is normal.     Breath sounds: Normal breath sounds.  Abdominal:     General: Bowel sounds are normal.     Palpations: Abdomen is soft.  Lymphadenopathy:     Cervical: No cervical adenopathy.  Skin:    General: Skin is warm and dry.     Capillary Refill: Capillary refill takes less than 2 seconds.  Neurological:     Mental Status: She is alert and oriented to person, place, and time.  Psychiatric:        Behavior: Behavior normal.  Musculoskeletal Exam: C-spine limited range of motion with lateral rotation.  She has good flexion extension with no discomfort.  Thoracic and lumbar spine have good range of motion.  No midline spinal tenderness.  No SI joint tenderness.  Shoulder joints and elbow joints have good range of motion with no discomfort.  Rheumatoid nodule on extensor surface of the right elbow joint.  Very limited range of motion of both wrist joints.  She has tenderness and inflammation of both wrists.  She has synovitis of the right first and fifth PIP joints and right third MCP joint.  She has synovitis of the left second, third, and fifth MCP joints and first PIP joint.  Hip joints have good range of motion with no discomfort.  Right knee has good range of motion with no warmth or effusion.  Left knee replacement is warm but has good extension on exam.  No tenderness or swelling of ankle joints.  CDAI Exam: CDAI Score: 19.6  Patient Global: 8 mm; Provider Global: 8 mm Swollen: 9 ; Tender: 10  Joint Exam      Right  Left  Wrist  Swollen Tender  Swollen Tender  MCP 2     Swollen Tender  MCP 3  Swollen Tender  Swollen Tender  MCP 5      Swollen Tender  IP  Swollen Tender  Swollen Tender  PIP 5  Swollen Tender     Cervical Spine   Tender        Investigation: No additional findings.  Imaging: No results found.  Recent Labs: Lab Results  Component Value Date   WBC 9.0 02/02/2019   HGB 12.5 02/02/2019   PLT 300 02/02/2019   NA 141 02/02/2019   K 4.2 02/02/2019   CL 105 02/02/2019   CO2 26 02/02/2019   GLUCOSE 95 02/02/2019   BUN 22 02/02/2019   CREATININE 0.61 02/02/2019   BILITOT 0.3 02/02/2019   ALKPHOS 77 08/10/2017   AST 15 02/02/2019   ALT 17 02/02/2019   PROT 6.9 02/02/2019   ALBUMIN 3.8 08/10/2017   CALCIUM 9.1 02/02/2019   GFRAA 119 02/02/2019   QFTBGOLDPLUS NEGATIVE 10/28/2018    Speciality Comments: No specialty comments available.  Procedures:  No procedures performed Allergies: Penicillins   Assessment / Plan:     Visit Diagnoses: Rheumatoid arthritis involving multiple sites with positive rheumatoid factor (HCC) - Positive rheumatoid factor, positive anti-CCP, erosive disease: She presents today with synovitis and tenderness of several MCP, PIPs and both wrist joints as described above.  She is having pain and swelling in both hands and both wrist joints.  She has also been having increased neck pain and has discomfort with lateral rotation.  She had a left knee arthroplasty performed by Dr. Corinna Gab on 12/17/2018.  She held Humira for 5 weeks and methotrexate for 3 weeks during that time.  She restarted on both medications about 6 weeks ago.  She reports that she had a prednisone taper about 1 month ago which helped the flare temporarily.  She is been injecting Humira 40 mg subcutaneously every 14 days and Rasuvo 20 mg every 7 days.  She has also been taking naproxen and Tylenol on a daily basis for pain relief.  She has been wearing wrist braces at night to help with the discomfort.  We will send in a prednisone taper starting at 20 mg tapering by 5 mg every 4 days.  In the past she has tried  taking Plaquenil in combination  with methotrexate but has not tried any other medications.  We discussed switching her from Humira to Enbrel.  We will apply for Enbrel 50 mg subcutaneous injections once weekly.  We will notify her once approved and schedule nurse visit for the administration of the first injection.  Indications, contraindications, and potential side effects of Enbrel were discussed.  All questions were addressed and consent was obtained today.  She will return for lab work in 1 month and then every 3 months after starting on Enbrel.  She will continue on Rasuvo and folic acid as prescribed.  She will follow-up in the office in 6 weeks.  High risk medications (not anticoagulants) long-term use -  Humira 40 mg every 14 days, Rasuvo 20 mg every 7 days, and folic acid 1 mg 2 tablets daily.  CBC and CMP were drawn on 02/02/2019.  TB gold was negative on 10/28/2018.  Primary osteoarthritis of right knee: She has good range of motion with no discomfort.  No warmth or effusion was noted.  S/P total knee arthroplasty, left: Performed by Dr. Magnus Ivan on 12/17/2018.  Warmth noted.  She has good extension on exam with no discomfort.  She has no nocturnal pain at this time.  She continues to go to physical therapy.  DDD (degenerative disc disease), thoracic: No midline spinal tenderness.   DDD (degenerative disc disease), lumbar: She has good ROM with no discomfort.  No midline spinal tenderness.  No symptoms of radiculopathy.   Other medical conditions are listed as follows:   History of vitamin D deficiency  History of gastroesophageal reflux (GERD)  History of rosacea/ History of acne  History of hiatal hernia  Orders: No orders of the defined types were placed in this encounter.  No orders of the defined types were placed in this encounter.   Face-to-face time spent with patient was 30 minutes. Greater than 50% of time was spent in counseling and coordination of care.  Follow-Up  Instructions: Return in about 6 weeks (around 03/28/2019) for Rheumatoid arthritis, DDD, Osteoarthritis.   Gearldine Bienenstock, PA-C   I examined and evaluated the patient with Sherron Ales PA.  Patient had severe synovitis on examination.  Humira is not effective for her anymore.  After discussing different treatment options we decided to proceed with Enbrel.  She was in agreement.  The plan of care was discussed as noted above.  Pollyann Savoy, MD  Note - This record has been created using Animal nutritionist.  Chart creation errors have been sought, but may not always  have been located. Such creation errors do not reflect on  the standard of medical care.

## 2019-02-14 ENCOUNTER — Other Ambulatory Visit: Payer: Self-pay

## 2019-02-14 ENCOUNTER — Encounter: Payer: Self-pay | Admitting: Physician Assistant

## 2019-02-14 ENCOUNTER — Telehealth: Payer: Self-pay | Admitting: Pharmacist

## 2019-02-14 ENCOUNTER — Ambulatory Visit: Payer: 59 | Admitting: Physician Assistant

## 2019-02-14 VITALS — BP 153/96 | HR 90 | Resp 13 | Ht 62.5 in | Wt 227.0 lb

## 2019-02-14 DIAGNOSIS — M0579 Rheumatoid arthritis with rheumatoid factor of multiple sites without organ or systems involvement: Secondary | ICD-10-CM

## 2019-02-14 DIAGNOSIS — M1711 Unilateral primary osteoarthritis, right knee: Secondary | ICD-10-CM | POA: Diagnosis not present

## 2019-02-14 DIAGNOSIS — Z79899 Other long term (current) drug therapy: Secondary | ICD-10-CM | POA: Diagnosis not present

## 2019-02-14 DIAGNOSIS — Z96652 Presence of left artificial knee joint: Secondary | ICD-10-CM

## 2019-02-14 DIAGNOSIS — Z8719 Personal history of other diseases of the digestive system: Secondary | ICD-10-CM

## 2019-02-14 DIAGNOSIS — Z872 Personal history of diseases of the skin and subcutaneous tissue: Secondary | ICD-10-CM

## 2019-02-14 DIAGNOSIS — M51369 Other intervertebral disc degeneration, lumbar region without mention of lumbar back pain or lower extremity pain: Secondary | ICD-10-CM

## 2019-02-14 DIAGNOSIS — M5134 Other intervertebral disc degeneration, thoracic region: Secondary | ICD-10-CM

## 2019-02-14 DIAGNOSIS — Z8639 Personal history of other endocrine, nutritional and metabolic disease: Secondary | ICD-10-CM

## 2019-02-14 DIAGNOSIS — M5136 Other intervertebral disc degeneration, lumbar region: Secondary | ICD-10-CM

## 2019-02-14 MED ORDER — PREDNISONE 5 MG PO TABS: ORAL_TABLET | ORAL | 0 refills | Status: AC

## 2019-02-14 NOTE — Telephone Encounter (Signed)
Ok to apply for Simponi sq injections.

## 2019-02-14 NOTE — Telephone Encounter (Signed)
Per plan, patient must use [facility].

## 2019-02-14 NOTE — Telephone Encounter (Signed)
Started to initiate Enbrel Prior Auth, and patient's plan is asking is patient has a history of failure, contraindication, or intolerance to TWO of the following preferred biologic products: Cimzia (certolizumab), Humira (adalimumab), Simponi (golimumab), Olumiant (baricitinib), Rinvoq (upadacitinib), Xeljanz/Xeljanz XR (tofacitinib).  Please Advise.

## 2019-02-14 NOTE — Telephone Encounter (Signed)
Submitted a Prior Authorization request to [facility] for [facility] via Cover My Meds. Will update once we receive a response.

## 2019-02-14 NOTE — Progress Notes (Signed)
Pharmacy Note  Subjective: Patient presents today to the [facility] to see Dr. Estanislado Pandy.   Patient seen by the pharmacist for counseling on Enbrel for rheumatoid arthritis.  She is currently on Humira and Rasuvo with inadequate response. Also tried and failed Plaquenil in the past. Last dose of Humira was Saturday 10/26.  Objective:  CBC    Component Value Date/Time   WBC 9.0 02/02/2019 1540   RBC 4.27 02/02/2019 1540   HGB 12.5 02/02/2019 1540   HCT 38.2 02/02/2019 1540   PLT 300 02/02/2019 1540   MCV 89.5 02/02/2019 1540   MCH 29.3 02/02/2019 1540   MCHC 32.7 02/02/2019 1540   RDW 15.1 (H) 02/02/2019 1540   LYMPHSABS 2,511 02/02/2019 1540   MONOABS 0.9 08/10/2017 1455   EOSABS 324 02/02/2019 1540   BASOSABS 63 02/02/2019 1540     CMP     Component Value Date/Time   NA 141 02/02/2019 1540   K 4.2 02/02/2019 1540   CL 105 02/02/2019 1540   CO2 26 02/02/2019 1540   GLUCOSE 95 02/02/2019 1540   BUN 22 02/02/2019 1540   CREATININE 0.61 02/02/2019 1540   CALCIUM 9.1 02/02/2019 1540   PROT 6.9 02/02/2019 1540   ALBUMIN 3.8 08/10/2017 1455   AST 15 02/02/2019 1540   ALT 17 02/02/2019 1540   ALKPHOS 77 08/10/2017 1455   BILITOT 0.3 02/02/2019 1540   GFRNONAA 103 02/02/2019 1540   GFRAA 119 02/02/2019 1540     Baseline Immunosuppressant Therapy Labs TB GOLD Quantiferon TB Gold Latest Ref Rng & Units 10/28/2018  Quantiferon TB Gold Plus NEGATIVE NEGATIVE   Hepatitis Panel: pending next visit  HIV Lab Results  Component Value Date   HIV NONREACTIVE 06/15/2015   Immunoglobulins: pending next visit  SPEP: normal 08/22/2010  G6PD No results found for: G6PDH TPMT No results found for: TPMT   Chest x-ray: Enlargement of cardiac silhouette with decreased lung volumes and increased bibasilar atelectasis 10/08/2011  Does patient have diagnosis of heart failure?  No  Assessment/Plan:  Counseled patient that Enbrel is a TNF blocking agent. Counseled  patient on purpose, proper use, and adverse effects of Enbrel.  Reviewed the most common adverse effects including infections, headache, and injection site reactions. Discussed that there is the possibility of an increased risk of malignancy but it is not well understood if this increased risk is due to the medication or the disease state.  Advised patient to get yearly dermatology exams due to risk of skin cancer.  Counseled patient that Enbrel should be held prior to scheduled surgery.  Counseled patient to avoid live vaccines while on Enbrel. Recommend annual influenza, Pneumovax 23, Prevnar 13, and Shingrix as indicated.   Reviewed the importance of regular labs while on Enbrel therapy.  Advised patient to get standing labs one month after starting Enbrel then every 2 months.  Provided patient with standing lab orders.  Provided patient with medication education material and answered all questions.  Patient voiced understanding.  Patient consented to Enbrel.  Will upload consent into the media tab.  Reviewed storage instructions for Enbrel.  Advised initial injection must be administered in office.  Patient voiced understanding.    Patient dose will be Enbrel 50 mg every 7 days.  Prescription pending labs and/or insurance.  All questions encouraged and answered.  Instructed patient to call with any questions or concerns.  Mariella Saa, PharmD, [place], [place] [facility] Pharmacist [phone]  02/14/2019 10:53 AM

## 2019-02-14 NOTE — Patient Instructions (Signed)
We will apply for Enbrel through insurance.  We will update when we hear a response and schedule your new start appointment after 10/10.   Etanercept injection What is this medicine? ETANERCEPT (et a [employer]) is used for the treatment of rheumatoid arthritis in adults and children. The medicine is also used to treat psoriatic arthritis, ankylosing spondylitis, and psoriasis. This medicine may be used for other purposes; ask your health care provider or pharmacist if you have questions. COMMON BRAND NAME(S): Enbrel What should I tell my health care provider before I take this medicine? They need to know if you have any of these conditions:  blood disorders  cancer  congestive heart failure  diabetes  exposure to chickenpox  immune system problems  infection  multiple sclerosis  seizure disorder  tuberculosis, a positive skin test for tuberculosis or have recently been in close contact with someone who has tuberculosis  Wegener's granulomatosis  an unusual or allergic reaction to etanercept, latex, other medicines, foods, dyes, or preservatives  pregnant or trying to get pregnant  breast-feeding How should I use this medicine? The medicine is given by injection under the skin. You will be taught how to prepare and give this medicine. Use exactly as directed. Take your medicine at regular intervals. Do not take your medicine more often than directed. It is important that you put your used needles and syringes in a special sharps container. Do not put them in a trash can. If you do not have a sharps container, call your pharmacist or healthcare provider to get one. A special MedGuide will be given to you by the pharmacist with each prescription and refill. Be sure to read this information carefully each time. Talk to your pediatrician regarding the use of this medicine in children. While this drug may be prescribed for children as young as 67 years of age for selected conditions,  precautions do apply. Overdosage: If you think you have taken too much of this medicine contact a poison control center or emergency room at once. NOTE: This medicine is only for you. Do not share this medicine with others. What if I miss a dose? If you miss a dose, contact your health care professional to find out when you should take your next dose. Do not take double or extra doses without advice. What may interact with this medicine? Do not take this medicine with any of the following medications:  anakinra This medicine may also interact with the following medications:  cyclophosphamide  sulfasalazine  vaccines This list may not describe all possible interactions. Give your health care provider a list of all the medicines, herbs, non-prescription drugs, or dietary supplements you use. Also tell them if you smoke, drink alcohol, or use illegal drugs. Some items may interact with your medicine. What should I watch for while using this medicine? Tell your doctor or healthcare professional if your symptoms do not start to get better or if they get worse. You will be tested for tuberculosis (TB) before you start this medicine. If your doctor prescribes any medicine for TB, you should start taking the TB medicine before starting this medicine. Make sure to finish the full course of TB medicine. Call your doctor or health care professional for advice if you get a fever, chills or sore throat, or other symptoms of a cold or flu. Do not treat yourself. This drug decreases your body's ability to fight infections. Try to avoid being around people who are sick. What side effects  may I notice from receiving this medicine? Side effects that you should report to your doctor or health care professional as soon as possible:  allergic reactions like skin rash, itching or hives, swelling of the face, lips, or tongue  changes in vision  fever, chills or any other sign of infection  numbness or  tingling in legs or other parts of the body  red, scaly patches or raised bumps on the skin  shortness of breath or difficulty breathing  swollen lymph nodes in the neck, underarm, or groin areas  unexplained weight loss  unusual bleeding or bruising  unusual swelling or fluid retention in the legs  unusually weak or tired Side effects that usually do not require medical attention (report to your doctor or health care professional if they continue or are bothersome):  dizziness  headache  nausea  redness, itching, or swelling at the injection site  vomiting This list may not describe all possible side effects. Call your doctor for medical advice about side effects. You may report side effects to FDA at 1-800-FDA-1088. Where should I keep my medicine? Keep out of the reach of children. Enbrel products: Store unopened Enbrel vials, cartridges, or pens in a refrigerator between 2 and 8 degrees C (36 and 46 degrees F). Do not freeze. Do not shake. Keep in the original container to protect from light. Throw away any unopened and unused medicine that has been stored in the refrigerator after the expiration date. If needed, you may store an Enbrel single-use vial, single-dose prefilled syringe, SureClick autoinjector, or Enbrel Mini cartridge at room temperature between 20 to 25 degrees C (68 to 77 degrees F) for up to 14 days. Protect from light, heat, and moisture. Do not shake. Once any of these items are stored at room temperature, do not place them back into the refrigerator. Throw the item away after 14 days, even if it still contains medicine. If you are using the Enbrel multi-dose vials, you will be instructed on how to dilute and store this medicine once it has been diluted. The Enbrel AutoTouch reusable autoinjector device should be stored at room temperature. Do NOT refrigerate the AutoTouch reusable autoinjector. Erelzi products: Store [employer]  pen in the refrigerator between 2 and 8 degrees C (36 and 46 degrees F). Do not freeze. Do not shake. Keep in the original container to protect from light. Throw away any unopened and unused medicine that has been stored in the refrigerator after the expiration date. If needed, you may store the [facility] prefilled syringe or pen at room temperature between 20 to 25 degrees C (68 to 77 degrees F) for up to 28 days. Protect from light, heat, and moisture. Do not shake. Once any of these items are stored at room temperature, do not place them back into the refrigerator. Throw the item away after 28 days, even if it still contains medicine. NOTE: This sheet is a summary. It may not cover all possible information. If you have questions about this medicine, talk to your doctor, pharmacist, or health care provider.  2020 Elsevier/Gold Standard (2018-08-02 09:49:58)

## 2019-02-14 NOTE — Telephone Encounter (Signed)
Received notification from [facility] regarding a prior authorization for [facility]. Authorization has been APPROVED from 02/14/19 to 02/14/20.   Will send document to scan center.  Authorization # [id number] Phone # [phone]  3:01 PM [place], CPhT

## 2019-02-14 NOTE — Telephone Encounter (Signed)
Please start benefits investigation for Enbrel.  She has tried Humira, Rasuvo, and Plaquenil with inadequate response.

## 2019-02-15 ENCOUNTER — Ambulatory Visit: Payer: 59

## 2019-02-15 ENCOUNTER — Other Ambulatory Visit: Payer: Self-pay

## 2019-02-15 DIAGNOSIS — M25562 Pain in left knee: Secondary | ICD-10-CM

## 2019-02-15 DIAGNOSIS — M25662 Stiffness of left knee, not elsewhere classified: Secondary | ICD-10-CM

## 2019-02-15 NOTE — Telephone Encounter (Signed)
Patient notified that insurance does not cover Enbrel.  Recommend switching to Simponi which is in the same class.  Patient is in agreement.  Her last Humira dose was 9/26.  New Start scheduled for 10/12 at [facility].    Will send prescription sent to [facility] at appointment.  Informed patient that she is co-pay card eligible and may go online to enroll.  Patient verbalized understanding.  All questions encouraged and answered.  Instructed patient to call with any other questions or concerns.  Mariella Saa, PharmD, [place], [place] Clinical Specialty Pharmacist [phone]  02/15/2019 10:49 AM

## 2019-02-16 NOTE — Therapy (Signed)
[facility] [address] [place], [address] Phone: [phone]   Fax:  [phone]  Physical Therapy Treatment  Patient Details  Name: Donna Lopez MRN: [id number] Date of Birth: September 12, 1965 Referring Provider (PT): Jean Rosenthal MD   Encounter Date: 02/15/2019  PT End of Session - 02/15/19 1727    Visit Number  13    Number of Visits  13    Date for PT Re-Evaluation  02/15/19    PT Start Time  1700    PT Stop Time  1745    PT Time Calculation (min)  45 min    Activity Tolerance  Patient tolerated treatment well    Behavior During Therapy  [facility] for tasks assessed/performed       Past Medical History:  Diagnosis Date  . Allergy   . Arthritis   . Carpal tunnel syndrome 10/2018  . DDD (degenerative disc disease), lumbar   . DDD (degenerative disc disease), thoracic   . Family history of adverse reaction to anesthesia    Mothers sister never woke up from surgery  . GERD (gastroesophageal reflux disease)   . Grade I diastolic dysfunction 9563  . Heart murmur   . Heart palpitations    History of  . History of hiatal hernia   . OA (osteoarthritis)   . Obese   . Rheumatoid arthritis ([place])   . 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 Vitamin D deficiency     Past Surgical History:  Procedure Laterality Date  . COLONOSCOPY  2014   DB- normal   . NO PAST SURGERIES    . 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   Procedure: LEFT TOTAL KNEE ARTHROPLASTY;  Surgeon: Mcarthur Rossetti, MD;  Location: [facility];  Service: Orthopedics;  Laterality: Left;  . UPPER GASTROINTESTINAL ENDOSCOPY      There were no vitals filed for this visit.  Subjective Assessment - 02/15/19 1704    Subjective  Patient states she is improving and was able to bend her knee really well yesterday. Patient reports she would like to get her knee to over 120degrees.    Pertinent History  RA, OA, DDD, Obesity    Limitations  Walking     How long can you stand comfortably?  10 min    How long can you walk comfortably?  10 min    Patient Stated Goals  Improve ROM, dec pain, improve gait mechanics/endurance    Currently in Pain?  No/denies    Pain Onset  1 to 4 weeks ago       TREATMENT  Therex Bike x 10 minutes for increase in flexion ROM level: 5 Foot on step lunges - x 20 Step ups off of 8" - x20 Squats with foot on 8" step - x 20 Lunges in standing - x 20   Lunges on a step -- x 20   Performed exercises to improve AROM and strength along the affected side  Able to achieve 114 degrees at the end of the session through PROM    PT Education - 02/15/19 1726    Education Details  form/technique with exercise    Person(s) Educated  Patient    Methods  Explanation;Demonstration    Comprehension  Verbalized understanding;Returned demonstration       PT Short Term Goals - 01/05/19 1005      PT SHORT TERM GOAL #1   Title  Pt will be compliant and independent with her HEP.  Time  2    Period  Weeks    Status  New    Target Date  01/19/19        PT Long Term Goals - 01/06/19 1740      PT LONG TERM GOAL #1   Title  Pt will report 0/10 pain at rest and with activity, demonstrating a significantly improved pain response with mobility.    Baseline  2/10    Time  6    Period  Weeks    Status  New      PT LONG TERM GOAL #2   Title  Pt will demonstrate a 10% reduction on her LEFS to demonstrate a significantly improved overall function and reduction in disability.    Baseline  LEFS deferred to next session 2/2 time, check next session note for score.; 01/06/19 33/80    Time  6    Period  Weeks    Status  New    Target Date  02/17/19      PT LONG TERM GOAL #3   Title  Pt will demonstrate at least 120 degrees of L knee flexion to demonstrate improved ability to navigate stairs and ambulate with improved mechanics and functional ROM.    Baseline  L knee flex 80 deg in seated position (58 deg  supine)    Time  6    Period  Weeks    Status  New    Target Date  02/17/19      PT LONG TERM GOAL #4   Title  Pt will demonstrate 0 deg of left knee extension to demonstrate significantly improved L knee ROM, which will allow her to ambulate with improved mechanics and independence.    Baseline  L knee ext lacking 15 deg    Time  6    Period  Weeks    Status  New    Target Date  02/17/19            Plan - 02/16/19 1018    Clinical Impression Statement  Patient demonstrates improvement in knee flexion to 114 passively at the end of the session indicating functional carryover between sessions. Patient is improving overall and has greater control of knee function today with ability to perform squats throughout greater AROM compared to previous sessions. Patient is improving overall, however, continues to have limitations in knee motion. Patient will benefit from further skilled therapy to return to prior level of function.    Personal Factors and Comorbidities  Comorbidity 1;Comorbidity 3+;Comorbidity 2    Comorbidities  s/p TKA, obesity, OA, RA, DDD    Examination-Activity Limitations  Sleep;Locomotion Level;Stairs;Squat    Examination-Participation Restrictions  Community Activity    Stability/Clinical Decision Making  Stable/Uncomplicated    Rehab Potential  Good    PT Frequency  2x / week    PT Duration  6 weeks    PT Treatment/Interventions  ADLs/Self Care Home Management;Electrical Stimulation;Cryotherapy;Moist Heat;Gait training;Neuromuscular re-education;Stair training;Functional mobility training;Therapeutic activities;Patient/family education;Therapeutic exercise;Balance training;Dry needling;Passive range of motion;Scar mobilization;Manual techniques;Joint Manipulations    PT Next Visit Plan  Assess stairs, gait further. Continue to progress ROM/strengthening exercises and HEP.    PT Home Exercise Plan  See education section.    Consulted and Agree with Plan of Care   Patient       Patient will benefit from skilled therapeutic intervention in order to improve the following deficits and impairments:  Abnormal gait, Decreased range of motion, Difficulty walking, Increased muscle spasms, Decreased endurance,  Decreased activity tolerance, Pain, Improper body mechanics  Visit Diagnosis: Stiffness of left knee, not elsewhere classified  Acute pain of left knee     Problem List Patient Active Problem List   Diagnosis Date Noted  . Unilateral primary osteoarthritis, left knee 12/17/2018  . Status post total left knee replacement 12/17/2018  . Primary osteoarthritis of left knee 11/23/2018  . Carpal tunnel syndrome, left upper limb 11/23/2018  . BMI 40.0-44.9, adult (HCC) 07/12/2018  . Primary osteoarthritis of both knees 12/24/2016  . 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 DDD (degenerative disc disease), lumbar 11/17/2016  . Vitamin D deficiency 11/17/2016  . History of rosacea/ History of acne 11/17/2016  . High risk medications (not anticoagulants) long-term use 06/25/2016  . History of degenerative disc disease 06/25/2016  . Severe obesity (BMI >= 40) (HCC) 06/13/2014  . Left-sided thoracic back pain 07/19/2013  . PALPITATIONS 10/17/2009  . UTI 05/24/2009  . Rheumatoid arthritis (HCC) 05/24/2009  . Hair loss 02/22/2009  . VAGINAL BLEEDING 10/05/2008  . Carrier of group B Streptococcus 07/28/2007  . GERD 12/28/2006  . CARDIAC MURMUR, HX OF 12/28/2006  . HIATAL HERNIA, HX OF 12/28/2006    Myrene Galas, PT DPT 02/16/2019, 10:23 AM  [facility] [address], [place], [address] Phone: [phone]   Fax:  [phone]  Name: TAMELIA MICHALOWSKI MRN: [id number] Date of Birth: Oct 21, 1965

## 2019-02-17 ENCOUNTER — Ambulatory Visit: Payer: 59 | Attending: Orthopaedic Surgery

## 2019-02-17 ENCOUNTER — Other Ambulatory Visit: Payer: Self-pay

## 2019-02-17 DIAGNOSIS — M25562 Pain in left knee: Secondary | ICD-10-CM | POA: Diagnosis present

## 2019-02-17 DIAGNOSIS — M25662 Stiffness of left knee, not elsewhere classified: Secondary | ICD-10-CM

## 2019-02-17 DIAGNOSIS — M1712 Unilateral primary osteoarthritis, left knee: Secondary | ICD-10-CM | POA: Diagnosis not present

## 2019-02-17 NOTE — Addendum Note (Signed)
Addended by: Blain Pais on: 02/17/2019 06:41 PM   Modules accepted: Orders

## 2019-02-17 NOTE — Therapy (Signed)
[facility] [address] [place], [address] Phone: [phone]   Fax:  [phone]  Physical Therapy Treatment  Patient Details  Name: KAYLONI ROCCO MRN: [id number] Date of Birth: [birth year] Referring Provider (PT): Doneen Poisson MD   Encounter Date: 02/17/2019  PT End of Session - 02/17/19 1710    Visit Number  14    Number of Visits  21    Date for PT Re-Evaluation  03/17/19    PT Start Time  1700    PT Stop Time  1745    PT Time Calculation (min)  45 min    Activity Tolerance  Patient tolerated treatment well    Behavior During Therapy  [facility] for tasks assessed/performed       Past Medical History:  Diagnosis Date  . Allergy   . Arthritis   . Carpal tunnel syndrome 10/2018  . DDD (degenerative disc disease), lumbar   . DDD (degenerative disc disease), thoracic   . Family history of adverse reaction to anesthesia    Mothers sister never woke up from surgery  . GERD (gastroesophageal reflux disease)   . Grade I diastolic dysfunction 2013  . Heart murmur   . Heart palpitations    History of  . History of hiatal hernia   . OA (osteoarthritis)   . Obese   . Rheumatoid arthritis (HCC)   . 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 Vitamin D deficiency     Past Surgical History:  Procedure Laterality Date  . COLONOSCOPY  2014   DB- normal   . NO PAST SURGERIES    . 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   Procedure: LEFT TOTAL KNEE ARTHROPLASTY;  Surgeon: Kathryne Hitch, MD;  Location: [facility];  Service: Orthopedics;  Laterality: Left;  . UPPER GASTROINTESTINAL ENDOSCOPY      There were no vitals filed for this visit.  Subjective Assessment - 02/17/19 1708    Subjective  Patient reports she feels like her knee has been stiff over the past couple of days but otherwise no signficant changes.    Pertinent History  RA, OA, DDD, Obesity    Limitations  Walking    How long can you stand  comfortably?  10 min    How long can you walk comfortably?  10 min    Patient Stated Goals  Improve ROM, dec pain, improve gait mechanics/endurance    Currently in Pain?  No/denies    Pain Onset  1 to 4 weeks ago       TREATMENT Therex Bike x 10 minutes for increase in flexion ROM  level: 5 Foot on step lunges - x 20 with dropping into lower AROM  Squats with foot on 8" step - x 20 Leg Press in sitting - x20 with 115# ; x 20 with solo L LE #55 - x10 [employer] single leg squat - x 20; x 20 B LE with airex    Performed exercises to improve AROM and strength along the affected side   PT Education - 02/17/19 1709    Education Details  form/technique with exercise    Person(s) Educated  Patient    Methods  Explanation;Demonstration    Comprehension  Verbalized understanding;Returned demonstration       PT Short Term Goals - 02/17/19 1725      PT SHORT TERM GOAL #1   Title  Pt will be compliant and independent with her HEP.    Baseline  independent with HEP    Time  2    Period  Weeks    Status  Achieved    Target Date  01/19/19        PT Long Term Goals - 02/17/19 1726      PT LONG TERM GOAL #1   Title  Pt will report 0/10 pain at rest and with activity, demonstrating a significantly improved pain response with mobility.    Baseline  2/10; 02/17/2019: 0/10    Time  6    Period  Weeks    Status  Achieved      PT LONG TERM GOAL #2   Title  Pt will demonstrate a 10% reduction on her LEFS to demonstrate a significantly improved overall function and reduction in disability.    Baseline  LEFS deferred to next session 2/2 time, check next session note for score.; 01/06/19 33/80: 02/17/2019: 54    Time  6    Period  Weeks    Status  Achieved      PT LONG TERM GOAL #3   Title  Pt will demonstrate at least 120 degrees of L knee flexion to demonstrate improved ability to navigate stairs and ambulate with improved mechanics and functional ROM.    Baseline  L knee flex 80 deg in  seated position (58 deg supine); 02/17/2019: 115 deg flexion in sitting; supine 110 flexion    Time  6    Period  Weeks    Status  On-going      PT LONG TERM GOAL #4   Title  Pt will demonstrate 0 deg of left knee extension to demonstrate significantly improved L knee ROM, which will allow her to ambulate with improved mechanics and independence.    Baseline  L knee ext lacking 15 deg; 02/17/2019: 0 deg    Time  6    Period  Weeks    Status  Achieved            Plan - 02/17/19 1834    Clinical Impression Statement  Patient is making progress towards long term goals with ability to achieve 115 deg of knee flexion and 0 deg of knee extension in sitting compared to previous sessions. Although patient is improving, she continues to have limitations in knee flexion limiting her squatting ability. Patient is improving overall and will benefit from further skilled therapy to return to prior level of function.    Personal Factors and Comorbidities  Comorbidity 1;Comorbidity 3+;Comorbidity 2    Comorbidities  s/p TKA, obesity, OA, RA, DDD    Examination-Activity Limitations  Sleep;Locomotion Level;Stairs;Squat    Examination-Participation Restrictions  Community Activity    Stability/Clinical Decision Making  Stable/Uncomplicated    Rehab Potential  Good    PT Frequency  2x / week    PT Duration  6 weeks    PT Treatment/Interventions  ADLs/Self Care Home Management;Electrical Stimulation;Cryotherapy;Moist Heat;Gait training;Neuromuscular re-education;Stair training;Functional mobility training;Therapeutic activities;Patient/family education;Therapeutic exercise;Balance training;Dry needling;Passive range of motion;Scar mobilization;Manual techniques;Joint Manipulations    PT Next Visit Plan  Assess stairs, gait further. Continue to progress ROM/strengthening exercises and HEP.    PT Home Exercise Plan  See education section.    Consulted and Agree with Plan of Care  Patient       Patient  will benefit from skilled therapeutic intervention in order to improve the following deficits and impairments:  Abnormal gait, Decreased range of motion, Difficulty walking, Increased muscle spasms, Decreased endurance, Decreased activity tolerance, Pain, Improper body  mechanics  Visit Diagnosis: Stiffness of left knee, not elsewhere classified  Acute pain of left knee     Problem List Patient Active Problem List   Diagnosis Date Noted  . Unilateral primary osteoarthritis, left knee 12/17/2018  . Status post total left knee replacement 12/17/2018  . Primary osteoarthritis of left knee 11/23/2018  . Carpal tunnel syndrome, left upper limb 11/23/2018  . BMI 40.0-44.9, adult ([place]) 07/12/2018  . Primary osteoarthritis of both knees 12/24/2016  . 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 DDD (degenerative disc disease), lumbar 11/17/2016  . Vitamin D deficiency 11/17/2016  . History of rosacea/ History of acne 11/17/2016  . High risk medications (not anticoagulants) long-term use 06/25/2016  . History of degenerative disc disease 06/25/2016  . Severe obesity (BMI >= 40) ([place]) 06/13/2014  . Left-sided thoracic back pain 07/19/2013  . PALPITATIONS 10/17/2009  . UTI 05/24/2009  . Rheumatoid arthritis ([place]) 05/24/2009  . Hair loss 02/22/2009  . VAGINAL BLEEDING 10/05/2008  . Carrier of group B Streptococcus 07/28/2007  . GERD 12/28/2006  . CARDIAC MURMUR, HX OF 12/28/2006  . HIATAL HERNIA, HX OF 12/28/2006    Blythe Stanford, PT DPT 02/17/2019, 6:37 PM  [facility] [address] [place], [address] Phone: [phone]   Fax:  [phone]  Name: EVELLA KASAL MRN: [id number] Date of Birth: 1965-06-30

## 2019-02-18 ENCOUNTER — Encounter: Payer: BC Managed Care – PPO | Admitting: Family Medicine

## 2019-02-22 ENCOUNTER — Ambulatory Visit: Payer: 59

## 2019-02-22 ENCOUNTER — Other Ambulatory Visit: Payer: Self-pay

## 2019-02-22 DIAGNOSIS — M25562 Pain in left knee: Secondary | ICD-10-CM

## 2019-02-22 DIAGNOSIS — M25662 Stiffness of left knee, not elsewhere classified: Secondary | ICD-10-CM

## 2019-02-23 ENCOUNTER — Encounter: Payer: Self-pay | Admitting: Physician Assistant

## 2019-02-23 ENCOUNTER — Ambulatory Visit (INDEPENDENT_AMBULATORY_CARE_PROVIDER_SITE_OTHER): Payer: 59 | Admitting: Physician Assistant

## 2019-02-23 DIAGNOSIS — Z96652 Presence of left artificial knee joint: Secondary | ICD-10-CM

## 2019-02-23 NOTE — Therapy (Signed)
[facility] [address], [place], [address] Phone: [phone]   Fax:  [phone]  Physical Therapy Treatment  Patient Details  Name: Donna Lopez MRN: [id number] Date of Birth: 06-28-1965 Referring Provider (PT): Jean Rosenthal MD   Encounter Date: 02/22/2019  PT End of Session - 02/22/19 1408    Visit Number  15    Number of Visits  21    Date for PT Re-Evaluation  03/17/19    PT Start Time  7124    PT Stop Time  1430    PT Time Calculation (min)  42 min    Activity Tolerance  Patient tolerated treatment well    Behavior During Therapy  [facility] for tasks assessed/performed       Past Medical History:  Diagnosis Date  . Allergy   . Arthritis   . Carpal tunnel syndrome 10/2018  . DDD (degenerative disc disease), lumbar   . DDD (degenerative disc disease), thoracic   . Family history of adverse reaction to anesthesia    Mothers sister never woke up from surgery  . GERD (gastroesophageal reflux disease)   . Grade I diastolic dysfunction 5809  . Heart murmur   . Heart palpitations    History of  . History of hiatal hernia   . OA (osteoarthritis)   . Obese   . Rheumatoid arthritis ([place])   . 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 Vitamin D deficiency     Past Surgical History:  Procedure Laterality Date  . COLONOSCOPY  2014   DB- normal   . NO PAST SURGERIES    . 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   Procedure: LEFT TOTAL KNEE ARTHROPLASTY;  Surgeon: Mcarthur Rossetti, MD;  Location: [facility];  Service: Orthopedics;  Laterality: Left;  . UPPER GASTROINTESTINAL ENDOSCOPY      There were no vitals filed for this visit.  Subjective Assessment - 02/22/19 1357    Subjective  Patient states she had a couple really good days over the weekend where the swelling was decreased and she was able to bend it more and easier.    Pertinent History  RA, OA, DDD, Obesity    Limitations  Walking    How  long can you stand comfortably?  10 min    How long can you walk comfortably?  10 min    Patient Stated Goals  Improve ROM, dec pain, improve gait mechanics/endurance    Currently in Pain?  No/denies    Pain Onset  1 to 4 weeks ago       TREATMENT Therex Bike x 10 minutes for increase in flexion ROM  level: 3 Ball roll outs in sitting - x 4 minutes Eccentric step downs off of 6" steps - 2 x 15 Foot on step lunges - x 20 with dropping into lower AROM  Squats with foot on 8" step - x 20 Leg Press in sitting - x20 with 115#; x 20 with solo L LE #65 - x10 Standing knee flexion with 3# -- x 20    Performed exercises to improve AROM and strength along the affected side    PT Education - 02/22/19 1403    Education Details  form/technique with exercise    Person(s) Educated  Patient    Methods  Explanation;Demonstration    Comprehension  Verbalized understanding;Returned demonstration       PT Short Term Goals - 02/17/19 1725      PT SHORT  TERM GOAL #1   Title  Pt will be compliant and independent with her HEP.    Baseline  independent with HEP    Time  2    Period  Weeks    Status  Achieved    Target Date  01/19/19        PT Long Term Goals - 02/17/19 1726      PT LONG TERM GOAL #1   Title  Pt will report 0/10 pain at rest and with activity, demonstrating a significantly improved pain response with mobility.    Baseline  2/10; 02/17/2019: 0/10    Time  6    Period  Weeks    Status  Achieved      PT LONG TERM GOAL #2   Title  Pt will demonstrate a 10% reduction on her LEFS to demonstrate a significantly improved overall function and reduction in disability.    Baseline  LEFS deferred to next session 2/2 time, check next session note for score.; 01/06/19 33/80: 02/17/2019: 54    Time  6    Period  Weeks    Status  Achieved      PT LONG TERM GOAL #3   Title  Pt will demonstrate at least 120 degrees of L knee flexion to demonstrate improved ability to navigate stairs  and ambulate with improved mechanics and functional ROM.    Baseline  L knee flex 80 deg in seated position (58 deg supine); 02/17/2019: 115 deg flexion in sitting; supine 110 flexion    Time  6    Period  Weeks    Status  On-going      PT LONG TERM GOAL #4   Title  Pt will demonstrate 0 deg of left knee extension to demonstrate significantly improved L knee ROM, which will allow her to ambulate with improved mechanics and independence.    Baseline  L knee ext lacking 15 deg; 02/17/2019: 0 deg    Time  6    Period  Weeks    Status  Achieved            Plan - 02/22/19 1420    Clinical Impression Statement  Patient demonstrates slight decrease in desired knee AROM however continues to demonstrate carryover between sessions with 116 degrees acheived PROM during the session. Patient is also able to perform exercises throughout greater AROM compared to previous sessions indicating carryover between sesions. Patient is improving overall and will benefit from further skilled therapy to return to prior level of function.    Personal Factors and Comorbidities  Comorbidity 1;Comorbidity 3+;Comorbidity 2    Comorbidities  s/p TKA, obesity, OA, RA, DDD    Examination-Activity Limitations  Sleep;Locomotion Level;Stairs;Squat    Examination-Participation Restrictions  Community Activity    Stability/Clinical Decision Making  Stable/Uncomplicated    Rehab Potential  Good    PT Frequency  2x / week    PT Duration  6 weeks    PT Treatment/Interventions  ADLs/Self Care Home Management;Electrical Stimulation;Cryotherapy;Moist Heat;Gait training;Neuromuscular re-education;Stair training;Functional mobility training;Therapeutic activities;Patient/family education;Therapeutic exercise;Balance training;Dry needling;Passive range of motion;Scar mobilization;Manual techniques;Joint Manipulations    PT Next Visit Plan  Assess stairs, gait further. Continue to progress ROM/strengthening exercises and HEP.    PT  Home Exercise Plan  See education section.    Consulted and Agree with Plan of Care  Patient       Patient will benefit from skilled therapeutic intervention in order to improve the following deficits and impairments:  Abnormal gait,  Decreased range of motion, Difficulty walking, Increased muscle spasms, Decreased endurance, Decreased activity tolerance, Pain, Improper body mechanics  Visit Diagnosis: Stiffness of left knee, not elsewhere classified  Acute pain of left knee     Problem List Patient Active Problem List   Diagnosis Date Noted  . Unilateral primary osteoarthritis, left knee 12/17/2018  . Status post total left knee replacement 12/17/2018  . Primary osteoarthritis of left knee 11/23/2018  . Carpal tunnel syndrome, left upper limb 11/23/2018  . BMI 40.0-44.9, adult (HCC) 07/12/2018  . Primary osteoarthritis of both knees 12/24/2016  . 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 DDD (degenerative disc disease), lumbar 11/17/2016  . Vitamin D deficiency 11/17/2016  . History of rosacea/ History of acne 11/17/2016  . High risk medications (not anticoagulants) long-term use 06/25/2016  . History of degenerative disc disease 06/25/2016  . Severe obesity (BMI >= 40) (HCC) 06/13/2014  . Left-sided thoracic back pain 07/19/2013  . PALPITATIONS 10/17/2009  . UTI 05/24/2009  . Rheumatoid arthritis (HCC) 05/24/2009  . Hair loss 02/22/2009  . VAGINAL BLEEDING 10/05/2008  . Carrier of group B Streptococcus 07/28/2007  . GERD 12/28/2006  . CARDIAC MURMUR, HX OF 12/28/2006  . HIATAL HERNIA, HX OF 12/28/2006    Myrene Galas, PT DPT 02/23/2019, 9:14 AM  [facility] [address] [place], [address] Phone: [phone]   Fax:  [phone]  Name: PATTRICIA WEIHER MRN: [id number] Date of Birth: Apr 29, 1966

## 2019-02-23 NOTE — Progress Notes (Signed)
HPI: Donna Lopez returns today 68 days status post left total knee arthroplasty.  She is overall doing well.  She is going to physical therapy.  She feels she is making progress.  She reports that therapy she is bending to 115 degrees.  She has no concerns.  Physical exam left knee surgical incisions well-healed slight keloid.  Full extension flexion 105 degrees.  No instability valgus varus stressing.  Calf supple nontender.  Impression: Status post left total knee arthroplasty 11/27/2018  Plan: She will continue to work on range of motion strengthening the knee.  Work on TEFL teacher.  See her back in 3 months no x-rays at that time.  She is doing well at that point but most likely will release her to return in July 2021.

## 2019-02-24 ENCOUNTER — Ambulatory Visit: Payer: 59 | Admitting: Physician Assistant

## 2019-02-28 ENCOUNTER — Other Ambulatory Visit: Payer: Self-pay

## 2019-02-28 ENCOUNTER — Ambulatory Visit: Payer: 59

## 2019-02-28 ENCOUNTER — Ambulatory Visit (INDEPENDENT_AMBULATORY_CARE_PROVIDER_SITE_OTHER): Payer: 59 | Admitting: Pharmacist

## 2019-02-28 VITALS — BP 124/73 | HR 94

## 2019-02-28 DIAGNOSIS — M0579 Rheumatoid arthritis with rheumatoid factor of multiple sites without organ or systems involvement: Secondary | ICD-10-CM

## 2019-02-28 DIAGNOSIS — M25562 Pain in left knee: Secondary | ICD-10-CM

## 2019-02-28 DIAGNOSIS — M25662 Stiffness of left knee, not elsewhere classified: Secondary | ICD-10-CM | POA: Diagnosis not present

## 2019-02-28 MED ORDER — GOLIMUMAB 50 MG/0.5ML ~~LOC~~ SOAJ: 50.0000 mg | SUBCUTANEOUS | 0 refills | Status: DC

## 2019-02-28 MED ORDER — GOLIMUMAB 50 MG/0.5ML ~~LOC~~ SOAJ: 50.0000 mg | Freq: Once | SUBCUTANEOUS | 0 refills | Status: DC

## 2019-02-28 NOTE — Patient Instructions (Signed)
Standing Labs We placed an order today for your standing lab work.    Please come back and get your standing labs in 1 month and then every 3 months  We have open lab daily Monday through Thursday from 8:30-12:30 PM and 1:30-4:30 PM and Friday from 8:30-12:30 PM and 1:30-4:00 PM at the office of Dr. Shaili Deveshwar.   You may experience shorter wait times on Monday and Friday afternoons. The office is located at [address], [address], [place], [place] [address] No appointment is necessary.   Labs are drawn by [facility].  You may receive a bill from [facility] for your lab work.  If you wish to have your labs drawn at another location, please call the office 24 hours in advance to send orders.  If you have any questions regarding directions or hours of operation,  please call [phone].   Just as a reminder please drink plenty of water prior to coming for your lab work. Thanks!  

## 2019-02-28 NOTE — Therapy (Signed)
[place] [facility] [address] [place], [address] Phone: [phone]   Fax:  [phone]  Physical Therapy Treatment  Patient Details  Name: Donna Lopez MRN: [id number] Date of Birth: 02/11/1966 Referring Provider (PT): Doneen Poisson MD   Encounter Date: 02/28/2019  PT End of Session - 02/28/19 1124    Visit Number  16    Number of Visits  21    Date for PT Re-Evaluation  03/17/19    PT Start Time  1118    PT Stop Time  1158    PT Time Calculation (min)  40 min    Activity Tolerance  Patient tolerated treatment well;No increased pain    Behavior During Therapy  WFL for tasks assessed/performed       Past Medical History:  Diagnosis Date  . Allergy   . Arthritis   . Carpal tunnel syndrome 10/2018  . DDD (degenerative disc disease), lumbar   . DDD (degenerative disc disease), thoracic   . Family history of adverse reaction to anesthesia    Mothers sister never woke up from surgery  . GERD (gastroesophageal reflux disease)   . Grade I diastolic dysfunction 2013  . Heart murmur   . Heart palpitations    History of  . History of hiatal hernia   . OA (osteoarthritis)   . Obese   . Rheumatoid arthritis (HCC)   . 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 Vitamin D deficiency     Past Surgical History:  Procedure Laterality Date  . COLONOSCOPY  2014   DB- normal   . NO PAST SURGERIES    . 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   Procedure: LEFT TOTAL KNEE ARTHROPLASTY;  Surgeon: Kathryne Hitch, MD;  Location: [facility];  Service: Orthopedics;  Laterality: Left;  . UPPER GASTROINTESTINAL ENDOSCOPY      There were no vitals filed for this visit.  Subjective Assessment - 02/28/19 1122    Subjective  Pt reports feels good. Stopped humira and starting Simponi now. Pt reports pain has been generally well controlled.    Pertinent History  RA, OA, DDD, Obesity    Currently in Pain?  No/denies       INTERVENTION THIS DATE: -AA/ROM Left Knee heel slides seated with slide 2 minutes -AA/ROM left fot rotation heel slides on slider x60 sec -Seated Left leg dangle traction stretch 3x60 sec 5lb AW c intermittent Grade 4 posterior tibial glide at 90 degrees (3x30sec) -seated knee flexion to end range 2x10x3 secH c 5lb ankle weight -seated LAQ c 2lb AW 2x10  -prone knee flexion stretch 10x10sec -STS from chair 1x10 hands free -STS from chair 1x10 hands free -Eccentric only posterior step downs off of 6" steps - 2 x 15 (surgical leg planted on step)  -Leg Press in sitting -x 20 with solo L LE #65-x10     PT Short Term Goals - 02/17/19 1725      PT SHORT TERM GOAL #1   Title  Pt will be compliant and independent with her HEP.    Baseline  independent with HEP    Time  2    Period  Weeks    Status  Achieved    Target Date  01/19/19        PT Long Term Goals - 02/17/19 1726      PT LONG TERM GOAL #1   Title  Pt will report 0/10 pain at rest and with activity, demonstrating  a significantly improved pain response with mobility.    Baseline  2/10; 02/17/2019: 0/10    Time  6    Period  Weeks    Status  Achieved      PT LONG TERM GOAL #2   Title  Pt will demonstrate a 10% reduction on her LEFS to demonstrate a significantly improved overall function and reduction in disability.    Baseline  LEFS deferred to next session 2/2 time, check next session note for score.; 01/06/19 33/80: 02/17/2019: 54    Time  6    Period  Weeks    Status  Achieved      PT LONG TERM GOAL #3   Title  Pt will demonstrate at least 120 degrees of L knee flexion to demonstrate improved ability to navigate stairs and ambulate with improved mechanics and functional ROM.    Baseline  L knee flex 80 deg in seated position (58 deg supine); 02/17/2019: 115 deg flexion in sitting; supine 110 flexion    Time  6    Period  Weeks    Status  On-going      PT LONG TERM GOAL #4   Title  Pt will demonstrate 0  deg of left knee extension to demonstrate significantly improved L knee ROM, which will allow her to ambulate with improved mechanics and independence.    Baseline  L knee ext lacking 15 deg; 02/17/2019: 0 deg    Time  6    Period  Weeks    Status  Achieved            Plan - 02/28/19 1124    Clinical Impression Statement  TREATMENTTherexBike x 10 minutes for increase in flexion ROM  level: 3Ball roll outs in sitting - x 4 minutesEccentric step downs off of 6" steps - 2 x 15Foot on step lunges - x 20 with dropping into lower AROM Squats with foot on 8" step - x 20Leg Press in sitting - x20 with 115#; x 20 with solo L LE #65 - x10Standing knee flexion with 3# -- x 20    Rehab Potential  Good    PT Frequency  2x / week    PT Duration  6 weeks    PT Treatment/Interventions  ADLs/Self Care Home Management;Electrical Stimulation;Cryotherapy;Moist Heat;Gait training;Neuromuscular re-education;Stair training;Functional mobility training;Therapeutic activities;Patient/family education;Therapeutic exercise;Balance training;Dry needling;Passive range of motion;Scar mobilization;Manual techniques;Joint Manipulations    PT Next Visit Plan  Assess stairs, gait further. Continue to progress ROM/strengthening exercises and HEP.    PT Home Exercise Plan  No updates this session.    Consulted and Agree with Plan of Care  Patient       Patient will benefit from skilled therapeutic intervention in order to improve the following deficits and impairments:  Abnormal gait, Decreased range of motion, Difficulty walking, Increased muscle spasms, Decreased endurance, Decreased activity tolerance, Pain, Improper body mechanics  Visit Diagnosis: Stiffness of left knee, not elsewhere classified  Acute pain of left knee     Problem List Patient Active Problem List   Diagnosis Date Noted  . Unilateral primary osteoarthritis, left knee 12/17/2018  . Status post total left knee replacement 12/17/2018  .  Primary osteoarthritis of left knee 11/23/2018  . Carpal tunnel syndrome, left upper limb 11/23/2018  . BMI 40.0-44.9, adult ([place]) 07/12/2018  . Primary osteoarthritis of both knees 12/24/2016  . 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 DDD (degenerative disc disease), lumbar 11/17/2016  . Vitamin D deficiency 11/17/2016  .  History of rosacea/ History of acne 11/17/2016  . High risk medications (not anticoagulants) long-term use 06/25/2016  . History of degenerative disc disease 06/25/2016  . Severe obesity (BMI >= 40) (HCC) 06/13/2014  . Left-sided thoracic back pain 07/19/2013  . PALPITATIONS 10/17/2009  . UTI 05/24/2009  . Rheumatoid arthritis (HCC) 05/24/2009  . Hair loss 02/22/2009  . VAGINAL BLEEDING 10/05/2008  . Carrier of group B Streptococcus 07/28/2007  . GERD 12/28/2006  . CARDIAC MURMUR, HX OF 12/28/2006  . HIATAL HERNIA, HX OF 12/28/2006   11:58 AM, 02/28/19 Rosamaria Lints, PT, DPT Physical Therapist - [place] [phone] (Office)    , C 02/28/2019, 11:29 AM  [place] [facility] [address] [place], [address] Phone: [phone]   Fax:  [phone]  Name: Donna Lopez MRN: [id number] Date of Birth: 1965/12/27

## 2019-02-28 NOTE — Progress Notes (Signed)
Pharmacy Note  Subjective:   Patient is being initiated on Simponi.  Patient was previously counseled extensively on and consented to initiation of Simponi at that time.  Patient presents to clinic today to receive the first dose of Simponi.  Last Humira dose was on 02/12/19.  Objective: CMP     Component Value Date/Time   NA 141 02/02/2019 1540   K 4.2 02/02/2019 1540   CL 105 02/02/2019 1540   CO2 26 02/02/2019 1540   GLUCOSE 95 02/02/2019 1540   BUN 22 02/02/2019 1540   CREATININE 0.61 02/02/2019 1540   CALCIUM 9.1 02/02/2019 1540   PROT 6.9 02/02/2019 1540   ALBUMIN 3.8 08/10/2017 1455   AST 15 02/02/2019 1540   ALT 17 02/02/2019 1540   ALKPHOS 77 08/10/2017 1455   BILITOT 0.3 02/02/2019 1540   GFRNONAA 103 02/02/2019 1540   GFRAA 119 02/02/2019 1540    CBC    Component Value Date/Time   WBC 9.0 02/02/2019 1540   RBC 4.27 02/02/2019 1540   HGB 12.5 02/02/2019 1540   HCT 38.2 02/02/2019 1540   PLT 300 02/02/2019 1540   MCV 89.5 02/02/2019 1540   MCH 29.3 02/02/2019 1540   MCHC 32.7 02/02/2019 1540   RDW 15.1 (H) 02/02/2019 1540   LYMPHSABS 2,511 02/02/2019 1540   MONOABS 0.9 08/10/2017 1455   EOSABS 324 02/02/2019 1540   BASOSABS 63 02/02/2019 1540    Baseline Immunosuppressant Therapy Labs TB GOLD Quantiferon TB Gold Latest Ref Rng & Units 10/28/2018  Quantiferon TB Gold Plus NEGATIVE NEGATIVE   Hepatitis Panel: pending 1 month follow up visit  HIV Lab Results  Component Value Date   HIV NONREACTIVE 06/15/2015   Immunoglobulins:pending 1 month follow up visit  SPEP: pending 1 month follow up visit  G6PD No results found for: G6PDH TPMT No results found for: TPMT   Chest x-ray: enlargement of cardiac silhouette with decreased lung volumes and increased bibasilar atelectasis 10/08/2011  Patient running a fever or have signs/symptoms of infection? No  Assessment/Plan:  Demonstrated proper injection technique with Simponi demo pen.  Patient able to  demonstrate proper injection technique using the teach back method.  Patient self injected in the left anterior thigh with:  Sample Medication: PZWCHEN 27 mg/16ml NDC: 78242-353-61 Lot: [id number] Expiration: 02/2020  Patient tolerated well.  Observed for 30 mins in office for adverse reaction and none noted.   Patient is to return in 1 month for labs and follow up visit.  Standing orders placed. Prescription sent to [facility]. She was given brochure in office with information on signing up for co-pay card.   All questions encouraged and answered.  Instructed patient to call with any further questions or concerns.  Mariella Saa, PharmD, [facility] Rheumatology Clinical Pharmacist  02/28/2019 8:17 AM

## 2019-03-02 ENCOUNTER — Ambulatory Visit: Payer: 59

## 2019-03-02 ENCOUNTER — Other Ambulatory Visit: Payer: Self-pay

## 2019-03-02 DIAGNOSIS — M25562 Pain in left knee: Secondary | ICD-10-CM

## 2019-03-02 DIAGNOSIS — M25662 Stiffness of left knee, not elsewhere classified: Secondary | ICD-10-CM | POA: Diagnosis not present

## 2019-03-02 NOTE — Therapy (Signed)
[place] [facility] [address] [place], [address] Phone: [phone]   Fax:  [phone]  Physical Therapy Treatment  Patient Details  Name: Donna Lopez MRN: [id number] Date of Birth: [birth year] Referring Provider (PT): Doneen Poisson MD   Encounter Date: 03/02/2019  PT End of Session - 03/02/19 1139    Visit Number  17    Number of Visits  21    Date for PT Re-Evaluation  03/17/19    PT Start Time  1118    PT Stop Time  1158    PT Time Calculation (min)  40 min    Activity Tolerance  Patient tolerated treatment well;No increased pain    Behavior During Therapy  WFL for tasks assessed/performed       Past Medical History:  Diagnosis Date  . Allergy   . Arthritis   . Carpal tunnel syndrome 10/2018  . DDD (degenerative disc disease), lumbar   . DDD (degenerative disc disease), thoracic   . Family history of adverse reaction to anesthesia    Mothers sister never woke up from surgery  . GERD (gastroesophageal reflux disease)   . Grade I diastolic dysfunction 2013  . Heart murmur   . Heart palpitations    History of  . History of hiatal hernia   . OA (osteoarthritis)   . Obese   . Rheumatoid arthritis (HCC)   . 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 Vitamin D deficiency     Past Surgical History:  Procedure Laterality Date  . COLONOSCOPY  2014   DB- normal   . NO PAST SURGERIES    . 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   Procedure: LEFT TOTAL KNEE ARTHROPLASTY;  Surgeon: Kathryne Hitch, MD;  Location: [facility];  Service: Orthopedics;  Laterality: Left;  . UPPER GASTROINTESTINAL ENDOSCOPY      There were no vitals filed for this visit.  Subjective Assessment - 03/02/19 1128    Subjective  Patient states her knee has been feeling good. Patient states no major changes otherwise.    Pertinent History  RA, OA, DDD, Obesity    Patient Stated Goals  Improve ROM, dec pain, improve gait  mechanics/endurance    Currently in Pain?  No/denies         TREATMENT Therapeutic Exercise Standing knee flexion with 5# weight with belt  seated knee flexion to end range 2x10x3 secH c 5lb ankle weight Squats in standing with foot on 8" step - x 20  Seated knee flexion machine - x 20 15# Unilaterally Single leg dl with 82# weight - x 15; with 20# -- x 8 Leg Press in sitting - x 20 with solo L LE #65 - x15; B x20 - x 20 Bike In sitting level 3 performed for 4 minutes on each side Performed exercises focusing on improving knee flexion and hamstring strength  PT Education - 03/02/19 1132    Education Details  form/technique with exercise    Person(s) Educated  Patient    Methods  Explanation;Demonstration    Comprehension  Returned demonstration;Verbalized understanding       PT Short Term Goals - 02/17/19 1725      PT SHORT TERM GOAL #1   Title  Pt will be compliant and independent with her HEP.    Baseline  independent with HEP    Time  2    Period  Weeks    Status  Achieved    Target Date  01/19/19        PT Long Term Goals - 02/17/19 1726      PT LONG TERM GOAL #1   Title  Pt will report 0/10 pain at rest and with activity, demonstrating a significantly improved pain response with mobility.    Baseline  2/10; 02/17/2019: 0/10    Time  6    Period  Weeks    Status  Achieved      PT LONG TERM GOAL #2   Title  Pt will demonstrate a 10% reduction on her LEFS to demonstrate a significantly improved overall function and reduction in disability.    Baseline  LEFS deferred to next session 2/2 time, check next session note for score.; 01/06/19 33/80: 02/17/2019: 54    Time  6    Period  Weeks    Status  Achieved      PT LONG TERM GOAL #3   Title  Pt will demonstrate at least 120 degrees of L knee flexion to demonstrate improved ability to navigate stairs and ambulate with improved mechanics and functional ROM.    Baseline  L knee flex 80 deg in seated position (58  deg supine); 02/17/2019: 115 deg flexion in sitting; supine 110 flexion    Time  6    Period  Weeks    Status  On-going      PT LONG TERM GOAL #4   Title  Pt will demonstrate 0 deg of left knee extension to demonstrate significantly improved L knee ROM, which will allow her to ambulate with improved mechanics and independence.    Baseline  L knee ext lacking 15 deg; 02/17/2019: 0 deg    Time  6    Period  Weeks    Status  Achieved            Plan - 03/02/19 1430    Clinical Impression Statement  Performed exercises focusing on improving knee flexion under load to improve ability to perform open chain knee flexion which is inhibited secondary to muscular weakness. Patient's weakness along the hamstring musculature is indicated by increased difficulty with performing knee flexion. Patient is improving overall and is improving with knee flexion 117 passively today. Patient will benefit from further skilled therapy to return to prior level of function.    Rehab Potential  Good    PT Frequency  2x / week    PT Duration  6 weeks    PT Treatment/Interventions  ADLs/Self Care Home Management;Electrical Stimulation;Cryotherapy;Moist Heat;Gait training;Neuromuscular re-education;Stair training;Functional mobility training;Therapeutic activities;Patient/family education;Therapeutic exercise;Balance training;Dry needling;Passive range of motion;Scar mobilization;Manual techniques;Joint Manipulations    PT Next Visit Plan  Assess stairs, gait further. Continue to progress ROM/strengthening exercises and HEP.    PT Home Exercise Plan  No updates this session.    Consulted and Agree with Plan of Care  Patient       Patient will benefit from skilled therapeutic intervention in order to improve the following deficits and impairments:  Abnormal gait, Decreased range of motion, Difficulty walking, Increased muscle spasms, Decreased endurance, Decreased activity tolerance, Pain, Improper body  mechanics  Visit Diagnosis: Stiffness of left knee, not elsewhere classified  Acute pain of left knee     Problem List Patient Active Problem List   Diagnosis Date Noted  . Unilateral primary osteoarthritis, left knee 12/17/2018  . Status post total left knee replacement 12/17/2018  . Primary osteoarthritis of left knee 11/23/2018  . Carpal tunnel syndrome, left upper limb 11/23/2018  . BMI  40.0-44.9, adult ([place]) 07/12/2018  . Primary osteoarthritis of both knees 12/24/2016  . 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 DDD (degenerative disc disease), lumbar 11/17/2016  . Vitamin D deficiency 11/17/2016  . History of rosacea/ History of acne 11/17/2016  . High risk medications (not anticoagulants) long-term use 06/25/2016  . History of degenerative disc disease 06/25/2016  . Severe obesity (BMI >= 40) ([place]) 06/13/2014  . Left-sided thoracic back pain 07/19/2013  . PALPITATIONS 10/17/2009  . UTI 05/24/2009  . Rheumatoid arthritis ([place]) 05/24/2009  . Hair loss 02/22/2009  . VAGINAL BLEEDING 10/05/2008  . Carrier of group B Streptococcus 07/28/2007  . GERD 12/28/2006  . CARDIAC MURMUR, HX OF 12/28/2006  . HIATAL HERNIA, HX OF 12/28/2006    Blythe Stanford, PT DPT 03/02/2019, 2:52 PM  [place] [facility] [address], [place], [address] Phone: [phone]   Fax:  [phone]  Name: ZORIANNA TALIAFERRO MRN: [id number] Date of Birth: 11-Jan-1966

## 2019-03-08 ENCOUNTER — Other Ambulatory Visit: Payer: Self-pay

## 2019-03-08 ENCOUNTER — Ambulatory Visit: Payer: 59

## 2019-03-08 DIAGNOSIS — M25562 Pain in left knee: Secondary | ICD-10-CM

## 2019-03-08 DIAGNOSIS — M25662 Stiffness of left knee, not elsewhere classified: Secondary | ICD-10-CM | POA: Diagnosis not present

## 2019-03-08 NOTE — Therapy (Signed)
[facility] [address] [place], [address] Phone: [phone]   Fax:  [phone]  Physical Therapy Treatment  Patient Details  Name: Donna Lopez MRN: [id number] Date of Birth: May 06, 1966 Referring Provider (PT): Doneen Poisson MD   Encounter Date: 03/08/2019  PT End of Session - 03/08/19 1135    Visit Number  18    Number of Visits  21    Date for PT Re-Evaluation  03/17/19    PT Start Time  1117    PT Stop Time  1200    PT Time Calculation (min)  43 min    Activity Tolerance  Patient tolerated treatment well;No increased pain    Behavior During Therapy  WFL for tasks assessed/performed       Past Medical History:  Diagnosis Date  . Allergy   . Arthritis   . Carpal tunnel syndrome 10/2018  . DDD (degenerative disc disease), lumbar   . DDD (degenerative disc disease), thoracic   . Family history of adverse reaction to anesthesia    Mothers sister never woke up from surgery  . GERD (gastroesophageal reflux disease)   . Grade I diastolic dysfunction 2013  . Heart murmur   . Heart palpitations    History of  . History of hiatal hernia   . OA (osteoarthritis)   . Obese   . Rheumatoid arthritis (HCC)   . 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 Vitamin D deficiency     Past Surgical History:  Procedure Laterality Date  . COLONOSCOPY  2014   DB- normal   . NO PAST SURGERIES    . 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   Procedure: LEFT TOTAL KNEE ARTHROPLASTY;  Surgeon: Kathryne Hitch, MD;  Location: [facility];  Service: Orthopedics;  Laterality: Left;  . UPPER GASTROINTESTINAL ENDOSCOPY      There were no vitals filed for this visit.  Subjective Assessment - 03/08/19 1123    Subjective  Patient reports her knee has been improving and states she has had less swelling overall.    Pertinent History  RA, OA, DDD, Obesity    Patient Stated Goals  Improve ROM, dec pain, improve gait  mechanics/endurance    Currently in Pain?  No/denies        TREATMENT Therapeutic Exercise Squats in standing with foot on 8" step - x 20  Split squat with foot on chair - x 20 B Seated knee flexion machine - x 20 35# Unilaterally Leg Press in sitting - x 20 with solo L LE #65 - x15; B x20 - x 20 Bike In sitting level 2 performed for 4 minutes on each side Lunges on 8" step - x 20  Step ups on 8 " step - x 20  Knee flexion/extension with use of furniture slider - x 20   Performed exercises focusing on improving knee flexion and hamstring strength   PT Education - 03/08/19 1125    Education Details  form/technique with exercise    Person(s) Educated  Patient    Methods  Explanation;Demonstration    Comprehension  Verbalized understanding;Returned demonstration       PT Short Term Goals - 02/17/19 1725      PT SHORT TERM GOAL #1   Title  Pt will be compliant and independent with her HEP.    Baseline  independent with HEP    Time  2    Period  Weeks    Status  Achieved    Target Date  01/19/19        PT Long Term Goals - 02/17/19 1726      PT LONG TERM GOAL #1   Title  Pt will report 0/10 pain at rest and with activity, demonstrating a significantly improved pain response with mobility.    Baseline  2/10; 02/17/2019: 0/10    Time  6    Period  Weeks    Status  Achieved      PT LONG TERM GOAL #2   Title  Pt will demonstrate a 10% reduction on her LEFS to demonstrate a significantly improved overall function and reduction in disability.    Baseline  LEFS deferred to next session 2/2 time, check next session note for score.; 01/06/19 33/80: 02/17/2019: 54    Time  6    Period  Weeks    Status  Achieved      PT LONG TERM GOAL #3   Title  Pt will demonstrate at least 120 degrees of L knee flexion to demonstrate improved ability to navigate stairs and ambulate with improved mechanics and functional ROM.    Baseline  L knee flex 80 deg in seated position (58 deg  supine); 02/17/2019: 115 deg flexion in sitting; supine 110 flexion    Time  6    Period  Weeks    Status  On-going      PT LONG TERM GOAL #4   Title  Pt will demonstrate 0 deg of left knee extension to demonstrate significantly improved L knee ROM, which will allow her to ambulate with improved mechanics and independence.    Baseline  L knee ext lacking 15 deg; 02/17/2019: 0 deg    Time  6    Period  Weeks    Status  Achieved            Plan - 03/08/19 1426    Clinical Impression Statement  Patient demonstrates an improvement in knee flexion after performing progressive knee flexion under load. Patient is improving overall with ability to perform 119 degrees of flexion. Patient demonstrates overall improvement but continues to have difficulty with performing exercises with single leg stance. Patient is iproving overall and will benefit from further silled therapy to return to prior level of function.    Rehab Potential  Good    PT Frequency  2x / week    PT Duration  6 weeks    PT Treatment/Interventions  ADLs/Self Care Home Management;Electrical Stimulation;Cryotherapy;Moist Heat;Gait training;Neuromuscular re-education;Stair training;Functional mobility training;Therapeutic activities;Patient/family education;Therapeutic exercise;Balance training;Dry needling;Passive range of motion;Scar mobilization;Manual techniques;Joint Manipulations    PT Next Visit Plan  Assess stairs, gait further. Continue to progress ROM/strengthening exercises and HEP.    PT Home Exercise Plan  No updates this session.    Consulted and Agree with Plan of Care  Patient       Patient will benefit from skilled therapeutic intervention in order to improve the following deficits and impairments:  Abnormal gait, Decreased range of motion, Difficulty walking, Increased muscle spasms, Decreased endurance, Decreased activity tolerance, Pain, Improper body mechanics  Visit Diagnosis: Acute pain of left  knee  Stiffness of left knee, not elsewhere classified     Problem List Patient Active Problem List   Diagnosis Date Noted  . Unilateral primary osteoarthritis, left knee 12/17/2018  . Status post total left knee replacement 12/17/2018  . Primary osteoarthritis of left knee 11/23/2018  . Carpal tunnel syndrome, left upper limb 11/23/2018  . BMI  40.0-44.9, adult ([place]) 07/12/2018  . Primary osteoarthritis of both knees 12/24/2016  . 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 DDD (degenerative disc disease), lumbar 11/17/2016  . Vitamin D deficiency 11/17/2016  . History of rosacea/ History of acne 11/17/2016  . High risk medications (not anticoagulants) long-term use 06/25/2016  . History of degenerative disc disease 06/25/2016  . Severe obesity (BMI >= 40) ([place]) 06/13/2014  . Left-sided thoracic back pain 07/19/2013  . PALPITATIONS 10/17/2009  . UTI 05/24/2009  . Rheumatoid arthritis ([place]) 05/24/2009  . Hair loss 02/22/2009  . VAGINAL BLEEDING 10/05/2008  . Carrier of group B Streptococcus 07/28/2007  . GERD 12/28/2006  . CARDIAC MURMUR, HX OF 12/28/2006  . HIATAL HERNIA, HX OF 12/28/2006    Blythe Stanford, PT DPT 03/08/2019, 2:48 PM  [facility] [address], [place], [address] Phone: [phone]   Fax:  [phone]  Name: Donna Lopez MRN: [id number] Date of Birth: 07-25-1965

## 2019-03-15 ENCOUNTER — Other Ambulatory Visit: Payer: Self-pay

## 2019-03-15 ENCOUNTER — Ambulatory Visit: Payer: 59

## 2019-03-15 DIAGNOSIS — M25662 Stiffness of left knee, not elsewhere classified: Secondary | ICD-10-CM | POA: Diagnosis not present

## 2019-03-15 DIAGNOSIS — M25562 Pain in left knee: Secondary | ICD-10-CM

## 2019-03-15 NOTE — Progress Notes (Deleted)
Office Visit Note  Patient: Donna Lopez             Date of Birth: 08/07/1965           MRN: [id number]             PCP: [name], DO Referring: [name], * Visit Date: 03/28/2019 Occupation: @GUAROCC @  Subjective:  No chief complaint on file.   History of Present Illness: Donna Lopez is a 53 y.o. female ***   Activities of Daily Living:  Patient reports morning stiffness for *** {minute/hour:19697}.   Patient {ACTIONS;DENIES/REPORTS:21021675::"Denies"} nocturnal pain.  Difficulty dressing/grooming: {ACTIONS;DENIES/REPORTS:21021675::"Denies"} Difficulty climbing stairs: {ACTIONS;DENIES/REPORTS:21021675::"Denies"} Difficulty getting out of chair: {ACTIONS;DENIES/REPORTS:21021675::"Denies"} Difficulty using hands for taps, buttons, cutlery, and/or writing: {ACTIONS;DENIES/REPORTS:21021675::"Denies"}  No Rheumatology ROS completed.   PMFS History:  Patient Active Problem List   Diagnosis Date Noted  . Unilateral primary osteoarthritis, left knee 12/17/2018  . Status post total left knee replacement 12/17/2018  . Primary osteoarthritis of left knee 11/23/2018  . Carpal tunnel syndrome, left upper limb 11/23/2018  . BMI 40.0-44.9, adult ([place]) 07/12/2018  . Primary osteoarthritis of both knees 12/24/2016  . 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 DDD (degenerative disc disease), lumbar 11/17/2016  . Vitamin D deficiency 11/17/2016  . History of rosacea/ History of acne 11/17/2016  . High risk medications (not anticoagulants) long-term use 06/25/2016  . History of degenerative disc disease 06/25/2016  . Severe obesity (BMI >= 40) ([place]) 06/13/2014  . Left-sided thoracic back pain 07/19/2013  . PALPITATIONS 10/17/2009  . UTI 05/24/2009  . Rheumatoid arthritis ([place]) 05/24/2009  . Hair loss 02/22/2009  . VAGINAL BLEEDING 10/05/2008  . Carrier of group B Streptococcus 07/28/2007  . GERD 12/28/2006  . CARDIAC MURMUR, HX OF  12/28/2006  . HIATAL HERNIA, HX OF 12/28/2006    Past Medical History:  Diagnosis Date  . Allergy   . Arthritis   . Carpal tunnel syndrome 10/2018  . DDD (degenerative disc disease), lumbar   . DDD (degenerative disc disease), thoracic   . Family history of adverse reaction to anesthesia    Mothers sister never woke up from surgery  . GERD (gastroesophageal reflux disease)   . Grade I diastolic dysfunction 4034  . Heart murmur   . Heart palpitations    History of  . History of hiatal hernia   . OA (osteoarthritis)   . Obese   . Rheumatoid arthritis ([place])   . 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 Vitamin D deficiency     Family History  Problem Relation Age of Onset  . Uterine cancer Mother   . Diabetes Father   . Kidney cancer Father   . Colon cancer Sister 16  . Rectal cancer Sister   . Colon cancer Paternal Grandfather   . Colon polyps Brother   . Cancer Sister        breast   . Healthy Daughter   . Esophageal cancer Neg Hx   . Stomach cancer Neg Hx    Past Surgical History:  Procedure Laterality Date  . COLONOSCOPY  2014   DB- normal   . NO PAST SURGERIES    . 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   Procedure: LEFT TOTAL KNEE ARTHROPLASTY;  Surgeon: Mcarthur Rossetti, MD;  Location: [facility];  Service: Orthopedics;  Laterality: Left;  . UPPER GASTROINTESTINAL ENDOSCOPY     Social History   Social History Narrative   Exercise--- sometimes walks 1x a  week   Immunization History  Administered Date(s) Administered  . Influenza Whole 02/08/2009  . Influenza,inj,Quad PF,6+ Mos 03/09/2013, 06/13/2014, 06/16/2016  . Td 09/29/2001  . Tdap 06/13/2014     Objective: Vital Signs: There were no vitals taken for this visit.   Physical Exam   Musculoskeletal Exam: ***  CDAI Exam: CDAI Score: - Patient Global: -; Provider Global: - Swollen: -; Tender: - Joint Exam   No joint exam has been documented for this visit   There is currently no information documented  on the homunculus. Go to the Rheumatology activity and complete the homunculus joint exam.  Investigation: No additional findings.  Imaging: No results found.  Recent Labs: Lab Results  Component Value Date   WBC 9.0 02/02/2019   HGB 12.5 02/02/2019   PLT 300 02/02/2019   NA 141 02/02/2019   K 4.2 02/02/2019   CL 105 02/02/2019   CO2 26 02/02/2019   GLUCOSE 95 02/02/2019   BUN 22 02/02/2019   CREATININE 0.61 02/02/2019   BILITOT 0.3 02/02/2019   ALKPHOS 77 08/10/2017   AST 15 02/02/2019   ALT 17 02/02/2019   PROT 6.9 02/02/2019   ALBUMIN 3.8 08/10/2017   CALCIUM 9.1 02/02/2019   GFRAA 119 02/02/2019   QFTBGOLDPLUS NEGATIVE 10/28/2018    Speciality Comments: No specialty comments available.  Procedures:  No procedures performed Allergies: Penicillins   Assessment / Plan:     Visit Diagnoses: No diagnosis found.  Orders: No orders of the defined types were placed in this encounter.  No orders of the defined types were placed in this encounter.   Face-to-face time spent with patient was *** minutes. Greater than 50% of time was spent in counseling and coordination of care.  Follow-Up Instructions: No follow-ups on file.   Gearldine Bienenstock, PA-C  Note - This record has been created using Dragon software.  Chart creation errors have been sought, but may not always  have been located. Such creation errors do not reflect on  the standard of medical care.

## 2019-03-15 NOTE — Therapy (Signed)
[place] [address], [place], [address] Phone: [phone]   Fax:  [phone]  Physical Therapy Treatment  Patient Details  Name: Donna Lopez MRN: [id number] Date of Birth: 11-13-1965 Referring Provider (PT): Jean Rosenthal MD   Encounter Date: 03/15/2019  PT End of Session - 03/15/19 1638    Visit Number  19    Number of Visits  21    Date for PT Re-Evaluation  03/17/19    PT Start Time  1884    PT Stop Time  1660    PT Time Calculation (min)  45 min    Activity Tolerance  Patient tolerated treatment well;No increased pain    Behavior During Therapy  WFL for tasks assessed/performed       Past Medical History:  Diagnosis Date  . Allergy   . Arthritis   . Carpal tunnel syndrome 10/2018  . DDD (degenerative disc disease), lumbar   . DDD (degenerative disc disease), thoracic   . Family history of adverse reaction to anesthesia    Mothers sister never woke up from surgery  . GERD (gastroesophageal reflux disease)   . Grade I diastolic dysfunction 6301  . Heart murmur   . Heart palpitations    History of  . History of hiatal hernia   . OA (osteoarthritis)   . Obese   . Rheumatoid arthritis ([place])   . 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 Vitamin D deficiency     Past Surgical History:  Procedure Laterality Date  . COLONOSCOPY  2014   DB- normal   . NO PAST SURGERIES    . 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   Procedure: LEFT TOTAL KNEE ARTHROPLASTY;  Surgeon: Mcarthur Rossetti, MD;  [facility]: [facility];  Service: Orthopedics;  Laterality: Left;  . UPPER GASTROINTESTINAL ENDOSCOPY      There were no vitals filed for this visit.  Subjective Assessment - 03/15/19 1635    Subjective  Patient states her knee has been feeling "good overall" she states she has good overall.    Pertinent History  RA, OA, DDD, Obesity    Patient Stated Goals  Improve ROM, dec pain, improve gait  mechanics/endurance    Currently in Pain?  No/denies       TREATMENT Therapeutic Exercise Squats in standing - x 10 Bike In sitting level 2 performed for 4 minutes on each side Lunges on 8" step - x 20  Step ups/ step downs on 8 " step - x 20  Step ups/down on 6" step - x 20  Single leg DL in standing - x 20    Performed exercises to be added to HEP and to strengthen her quads and hamstrings   PT Education - 03/15/19 1638    Education Details  form/technique with exercise    Person(s) Educated  Patient    Methods  Explanation;Demonstration    Comprehension  Verbalized understanding;Returned demonstration       PT Short Term Goals - 02/17/19 1725      PT SHORT TERM GOAL #1   Title  Pt will be compliant and independent with her HEP.    Baseline  independent with HEP    Time  2    Period  Weeks    Status  Achieved    Target Date  01/19/19        PT Long Term Goals - 03/15/19 1712      PT LONG TERM GOAL #  1   Title  Pt will report 0/10 pain at rest and with activity, demonstrating a significantly improved pain response with mobility.    Baseline  2/10; 02/17/2019: 0/10    Time  6    Period  Weeks    Status  Achieved      PT LONG TERM GOAL #2   Title  Pt will demonstrate a 10% reduction on her LEFS to demonstrate a significantly improved overall function and reduction in disability.    Baseline  LEFS deferred to next session 2/2 time, check next session note for score.; 01/06/19 33/80: 02/17/2019: 54    Time  6    Period  Weeks    Status  Achieved      PT LONG TERM GOAL #3   Title  Pt will demonstrate at least 120 degrees of L knee flexion to demonstrate improved ability to navigate stairs and ambulate with improved mechanics and functional ROM.    Baseline  L knee flex 80 deg in seated position (58 deg supine); 02/17/2019: 115 deg flexion in sitting; supine 110 flexion; 03/15/2019: 121 degrees    Time  6    Period  Weeks    Status  Achieved      PT LONG TERM  GOAL #4   Title  Pt will demonstrate 0 deg of left knee extension to demonstrate significantly improved L knee ROM, which will allow her to ambulate with improved mechanics and independence.    Baseline  L knee ext lacking 15 deg; 02/17/2019: 0 deg    Time  6    Period  Weeks    Status  Achieved            Plan - 03/15/19 1709    Clinical Impression Statement  Patient has made significant improvements towards long term goals with ability to achieve 121 degrees of knee flexion. Patient has made significant improvements and currently has no pain with exercise of movement. Patient has met all long term goals and is to be discharged from further PT services.    Rehab Potential  Good    PT Frequency  2x / week    PT Duration  6 weeks    PT Treatment/Interventions  ADLs/Self Care Home Management;Electrical Stimulation;Cryotherapy;Moist Heat;Gait training;Neuromuscular re-education;Stair training;Functional mobility training;Therapeutic activities;Patient/family education;Therapeutic exercise;Balance training;Dry needling;Passive range of motion;Scar mobilization;Manual techniques;Joint Manipulations    PT Next Visit Plan  Assess stairs, gait further. Continue to progress ROM/strengthening exercises and HEP.    PT Home Exercise Plan  No updates this session.    Consulted and Agree with Plan of Care  Patient       Patient will benefit from skilled therapeutic intervention in order to improve the following deficits and impairments:  Abnormal gait, Decreased range of motion, Difficulty walking, Increased muscle spasms, Decreased endurance, Decreased activity tolerance, Pain, Improper body mechanics  Visit Diagnosis: Acute pain of left knee  Stiffness of left knee, not elsewhere classified     Problem List Patient Active Problem List   Diagnosis Date Noted  . Unilateral primary osteoarthritis, left knee 12/17/2018  . Status post total left knee replacement 12/17/2018  . Primary  osteoarthritis of left knee 11/23/2018  . Carpal tunnel syndrome, left upper limb 11/23/2018  . BMI 40.0-44.9, adult ([place]) 07/12/2018  . Primary osteoarthritis of both knees 12/24/2016  . 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 DDD (degenerative disc disease), lumbar 11/17/2016  . Vitamin D deficiency 11/17/2016  . History of rosacea/ History  of acne 11/17/2016  . High risk medications (not anticoagulants) long-term use 06/25/2016  . History of degenerative disc disease 06/25/2016  . Severe obesity (BMI >= 40) ([place]) 06/13/2014  . Left-sided thoracic back pain 07/19/2013  . PALPITATIONS 10/17/2009  . UTI 05/24/2009  . Rheumatoid arthritis ([place]) 05/24/2009  . Hair loss 02/22/2009  . VAGINAL BLEEDING 10/05/2008  . Carrier of group B Streptococcus 07/28/2007  . GERD 12/28/2006  . CARDIAC MURMUR, HX OF 12/28/2006  . HIATAL HERNIA, HX OF 12/28/2006    Blythe Stanford, PT DPT 03/15/2019, 5:13 PM  [facility] [address] [place], [address] Phone: [phone]   Fax:  [phone]  Name: ODYSSEY VASBINDER MRN: [id number] Date of Birth: 28-Jul-1965

## 2019-03-17 ENCOUNTER — Ambulatory Visit (INDEPENDENT_AMBULATORY_CARE_PROVIDER_SITE_OTHER): Payer: 59 | Admitting: Family Medicine

## 2019-03-17 ENCOUNTER — Encounter: Payer: Self-pay | Admitting: Family Medicine

## 2019-03-17 ENCOUNTER — Other Ambulatory Visit: Payer: Self-pay

## 2019-03-17 ENCOUNTER — Telehealth: Payer: Self-pay

## 2019-03-17 VITALS — BP 120/74 | HR 102 | Temp 97.3°F | Resp 18 | Ht 62.5 in | Wt 228.6 lb

## 2019-03-17 DIAGNOSIS — M170 Bilateral primary osteoarthritis of knee: Secondary | ICD-10-CM

## 2019-03-17 DIAGNOSIS — Z0000 Encounter for general adult medical examination without abnormal findings: Secondary | ICD-10-CM

## 2019-03-17 NOTE — Telephone Encounter (Signed)
Copied from [place] [id number]. Topic: General - Call Back - No Documentation >> Mar 17, 2019  8:21 AM Richardo Priest, NT wrote: Reason for CRM: Patient called back for [facility] to answer screening questions. Attempted line. Please advise.

## 2019-03-17 NOTE — Patient Instructions (Signed)
Preventive Care 40-54 Years Old, Female Preventive care refers to visits with your health care provider and lifestyle choices that can promote health and wellness. This includes:  A yearly physical exam. This may also be called an annual well check.  Regular dental visits and eye exams.  Immunizations.  Screening for certain conditions.  Healthy lifestyle choices, such as eating a healthy diet, getting regular exercise, not using drugs or products that contain nicotine and tobacco, and limiting alcohol use. What can I expect for my preventive care visit? Physical exam Your health care provider will check your:  Height and weight. This may be used to calculate body mass index (BMI), which tells if you are at a healthy weight.  Heart rate and blood pressure.  Skin for abnormal spots. Counseling Your health care provider may ask you questions about your:  Alcohol, tobacco, and drug use.  Emotional well-being.  Home and relationship well-being.  Sexual activity.  Eating habits.  Work and work environment.  Method of birth control.  Menstrual cycle.  Pregnancy history. What immunizations do I need?  Influenza (flu) vaccine  This is recommended every year. Tetanus, diphtheria, and pertussis (Tdap) vaccine  You may need a Td booster every 10 years. Varicella (chickenpox) vaccine  You may need this if you have not been vaccinated. Zoster (shingles) vaccine  You may need this after age 60. Measles, mumps, and rubella (MMR) vaccine  You may need at least one dose of MMR if you were born in 1957 or later. You may also need a second dose. Pneumococcal conjugate (PCV13) vaccine  You may need this if you have certain conditions and were not previously vaccinated. Pneumococcal polysaccharide (PPSV23) vaccine  You may need one or two doses if you smoke cigarettes or if you have certain conditions. Meningococcal conjugate (MenACWY) vaccine  You may need this if you  have certain conditions. Hepatitis A vaccine  You may need this if you have certain conditions or if you travel or work in places where you may be exposed to hepatitis A. Hepatitis B vaccine  You may need this if you have certain conditions or if you travel or work in places where you may be exposed to hepatitis B. Haemophilus influenzae type b (Hib) vaccine  You may need this if you have certain conditions. Human papillomavirus (HPV) vaccine  If recommended by your health care provider, you may need three doses over 6 months. You may receive vaccines as individual doses or as more than one vaccine together in one shot (combination vaccines). Talk with your health care provider about the risks and benefits of combination vaccines. What tests do I need? Blood tests  Lipid and cholesterol levels. These may be checked every 5 years, or more frequently if you are over 54 years old.  Hepatitis C test.  Hepatitis B test. Screening  Lung cancer screening. You may have this screening every year starting at age 54 if you have a 30-pack-year history of smoking and currently smoke or have quit within the past 15 years.  Colorectal cancer screening. All adults should have this screening starting at age 54 and continuing until age 75. Your health care provider may recommend screening at age 54 if you are at increased risk. You will have tests every 1-10 years, depending on your results and the type of screening test.  Diabetes screening. This is done by checking your blood sugar (glucose) after you have not eaten for a while (fasting). You may have this   done every 1-3 years.  Mammogram. This may be done every 1-2 years. Talk with your health care provider about when you should start having regular mammograms. This may depend on whether you have a family history of breast cancer.  BRCA-related cancer screening. This may be done if you have a family history of breast, ovarian, tubal, or peritoneal  cancers.  Pelvic exam and Pap test. This may be done every 3 years starting at age 21. Starting at age 30, this may be done every 5 years if you have a Pap test in combination with an HPV test. Other tests  Sexually transmitted disease (STD) testing.  Bone density scan. This is done to screen for osteoporosis. You may have this scan if you are at high risk for osteoporosis. Follow these instructions at home: Eating and drinking  Eat a diet that includes fresh fruits and vegetables, whole grains, lean protein, and low-fat dairy.  Take vitamin and mineral supplements as recommended by your health care provider.  Do not drink alcohol if: ? Your health care provider tells you not to drink. ? You are pregnant, may be pregnant, or are planning to become pregnant.  If you drink alcohol: ? Limit how much you have to 0-1 drink a day. ? Be aware of how much alcohol is in your drink. In the [place] one drink equals one 12 oz bottle of beer (355 mL), one 5 oz glass of wine (148 mL), or one 1 oz glass of hard liquor (44 mL). Lifestyle  Take daily care of your teeth and gums.  Stay active. Exercise for at least 30 minutes on 5 or more days each week.  Do not use any products that contain nicotine or tobacco, such as cigarettes, e-cigarettes, and chewing tobacco. If you need help quitting, ask your health care provider.  If you are sexually active, practice safe sex. Use a condom or other form of birth control (contraception) in order to prevent pregnancy and STIs (sexually transmitted infections).  If told by your health care provider, take low-dose aspirin daily starting at age 50. What's next?  Visit your health care provider once a year for a well check visit.  Ask your health care provider how often you should have your eyes and teeth checked.  Stay up to date on all vaccines. This information is not intended to replace advice given to you by your health care provider. Make sure you  discuss any questions you have with your health care provider. Document Released: 06/01/2015 Document Revised: 01/14/2018 Document Reviewed: 01/14/2018 Elsevier Patient Education  2020 Elsevier Inc.  

## 2019-03-17 NOTE — Progress Notes (Signed)
Subjective:     Donna SchimkeJulie S Lopez is a 53 y.o. female and is here for a comprehensive physical exam. The patient reports no problems.  Social History   Socioeconomic History  . Marital status: Married    Spouse name: Not on file  . Number of children: Not on file  . Years of education: Not on file  . Highest education level: Not on file  Occupational History    Employer: [employer]    Comment: marketing  Social Needs  . Financial resource strain: Not on file  . Food insecurity    Worry: Never true    Inability: Never true  . Transportation needs    Medical: Not on file    Non-medical: Not on file  Tobacco Use  . Smoking status: Never Smoker  . Smokeless tobacco: Never Used  Substance and Sexual Activity  . Alcohol use: Yes    Comment: rare alcohol use-once/twice a year  . Drug use: Never  . Sexual activity: Yes    Partners: Male  Lifestyle  . Physical activity    Days per week: Not on file    Minutes per session: Not on file  . Stress: Not on file  Relationships  . Social Musicianconnections    Talks on phone: Not on file    Gets together: Not on file    Attends religious service: Not on file    Active member of club or organization: Not on file    Attends meetings of clubs or organizations: Not on file    Relationship status: Not on file  . Intimate partner violence    Fear of current or ex partner: Not on file    Emotionally abused: Not on file    Physically abused: Not on file    Forced sexual activity: Not on file  Other Topics Concern  . Not on file  Social History Narrative   Exercise--- sometimes walks 1x a week   Health Maintenance  Topic Date Due  . MAMMOGRAM  12/15/2019  . PAP SMEAR-Modifier  01/07/2021  . COLONOSCOPY  12/03/2022  . TETANUS/TDAP  06/13/2024  . INFLUENZA VACCINE  Completed  . HIV Screening  Completed     The following portions of the patient's history were reviewed and updated as appropriate: She  has a past medical history of Allergy, Arthritis, Carpal tunnel syndrome (10/2018), DDD (degenerative disc disease), lumbar, DDD (degenerative disc disease), thoracic, Family history of adverse reaction to anesthesia, GERD (gastroesophageal reflux disease), Grade I diastolic dysfunction (2013), Heart murmur, Heart palpitations, History of hiatal hernia, OA (osteoarthritis), Obese, Rheumatoid arthritis (HCC), 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 She does not have any pertinent problems on file. She  has a past surgical history that includes No past surgeries; Colonoscopy (2014); Upper gastrointestinal endoscopy; and Total knee arthroplasty (Left, 12/17/2018). Her family history includes Cancer in her sister; Colon cancer in her paternal grandfather; Colon cancer (age of onset: 4050) in her sister; Colon polyps in her brother; Diabetes in her father; Healthy in her daughter; Kidney cancer in her father; Rectal cancer in her sister; Uterine  cancer in her mother. She  reports that she has never smoked. She has never used smokeless tobacco. She reports current alcohol use. She reports that she does not use drugs. She has a current medication list which includes the following prescription(s): acyclovir cream, calcium + d3, vitamin d, esomeprazole, folic acid, glucos-chondroit-hyaluron-msm, golimumab, l-lysine, rasuvo, valacyclovir, aspirin, and golimumab. Current Outpatient Medications on File Prior to Visit  Medication Sig Dispense Refill  . acyclovir cream (ZOVIRAX) 5 % Apply 1 application topically every 3 (three) hours. (Patient taking differently: Apply 1 application topically as needed (fever blisters). ) 15 g 0  . Calcium Carb-Cholecalciferol (CALCIUM + D3) 600-200 MG-UNIT TABS Take 1 capsule by mouth daily.    . Cholecalciferol (VITAMIN D) 50 MCG (2000 UT) CAPS Take 2,000 Units by mouth daily.     Marland Kitchen esomeprazole (NEXIUM) 40 MG capsule TAKE 1 CAPSULE DAILY (Patient taking differently: Take 40 mg by mouth daily at 12 noon. ) 90 capsule 4  . folic acid (FOLVITE) 1 MG tablet TAKE 2 TABLETS DAILY (Patient taking differently: Take 2 mg by mouth daily. ) 180 tablet 3  . Glucos-Chondroit-Hyaluron-MSM (GLUCOSAMINE CHONDROITIN JOINT PO) Take 1 tablet by mouth daily.    . Golimumab 50 MG/0.5ML SOAJ Inject 50 mg into the skin every 28 (twenty-eight) days. 1.5 mL 0  . L-Lysine 1000 MG TABS Take 1,000 mg by mouth daily.     Marland Kitchen RASUVO 20 MG/0.4ML SOAJ INJECT 20 MG UNDER THE SKIN ONCE A WEEK 12 pen 0  . valACYclovir (VALTREX) 1000 MG tablet 2 po x1 at first sign of cold sore then repeat in 12 h (Patient taking differently: Take 2,000 mg by mouth 2 (two) times daily as needed (fever blisters). ) 30 tablet 2  . aspirin 81 MG chewable tablet Chew 1 tablet (81 mg total) by mouth 2 (two) times daily. (Patient not taking: Reported on 03/17/2019) 30 tablet 0  . Golimumab 50 MG/0.5ML SOAJ Inject 50 mg into the skin once for 1 dose. 0.5 mL 0   No current facility-administered medications on file prior to visit.    She is allergic to penicillins..  Review of Systems Review of Systems  Constitutional: Negative for activity change, appetite change and fatigue.  HENT: Negative for hearing loss, congestion, tinnitus and ear discharge.  dentist q82m Eyes: Negative for visual disturbance (see optho q1y -- vision corrected to 20/20 with glasses).  Respiratory: Negative for cough, chest tightness and shortness of breath.   Cardiovascular: Negative for chest pain, palpitations and leg swelling.  Gastrointestinal: Negative for abdominal pain, diarrhea, constipation and abdominal distention.  Genitourinary: Negative for urgency, frequency, decreased urine volume and difficulty urinating.  Musculoskeletal: Negative for back pain, arthralgias and gait problem.  Skin: Negative for color change, pallor and rash.   Neurological: Negative for dizziness, light-headedness, numbness and headaches.  Hematological: Negative for adenopathy. Does not bruise/bleed easily.  Psychiatric/Behavioral: Negative for suicidal ideas, confusion, sleep disturbance, self-injury, dysphoric mood, decreased concentration and agitation.       Objective:    BP 120/74 (BP Location: Right Arm, Patient Position: Sitting, Cuff Size: Large)   Pulse (!) 102   Temp (!) 97.3 F (36.3 C) (Temporal)   Resp 18   Ht 5' 2.5" (1.588 m)   Wt 228 lb 9.6 oz (103.7 kg)   SpO2 98%   BMI 41.15 kg/m  General appearance: alert, cooperative, appears stated age and no distress Head: Normocephalic, without obvious abnormality, atraumatic Eyes: conjunctivae/corneas clear. PERRL, EOM's intact. Fundi  benign. Ears: normal TM's and external ear canals both ears Nose: Nares normal. Septum midline. Mucosa normal. No drainage or sinus tenderness. Throat: lips, mucosa, and tongue normal; teeth and gums normal Neck: no adenopathy, no carotid bruit, no JVD, supple, symmetrical, trachea midline and thyroid not enlarged, symmetric, no tenderness/mass/nodules Back: symmetric, no curvature. ROM normal. No CVA tenderness. Lungs: clear to auscultation bilaterally Breasts: gyn  Heart: regular rate and rhythm, S1, S2 normal, no murmur, click, rub or gallop Abdomen: soft, non-tender; bowel sounds normal; no masses,  no organomegaly Pelvic: deferred Extremities: extremities normal, atraumatic, no cyanosis or edema Pulses: 2+ and symmetric Skin: Skin color, texture, turgor normal. No rashes or lesions Lymph nodes: Cervical, supraclavicular, and axillary nodes normal. Neurologic: Alert and oriented X 3, normal strength and tone. Normal symmetric reflexes. Normal coordination and gait    Assessment:    Healthy female exam.      Plan:    ghm utd Check labs   See After Visit Summary for Counseling Recommendations    1. Preventative health care See  above  - Lipid panel - CBC with Differential/Platelet - Comprehensive metabolic panel - TSH  2. Primary osteoarthritis of both knees Per rheum

## 2019-03-17 NOTE — Assessment & Plan Note (Signed)
Per rheum 

## 2019-03-18 LAB — CBC WITH DIFFERENTIAL/PLATELET
Basophils Absolute: 0.1 10*3/uL (ref 0.0–0.1)
Basophils Relative: 0.7 % (ref 0.0–3.0)
Eosinophils Absolute: 0.3 10*3/uL (ref 0.0–0.7)
Eosinophils Relative: 3.2 % (ref 0.0–5.0)
HCT: 38.5 % (ref 36.0–46.0)
Hemoglobin: 12.6 g/dL (ref 12.0–15.0)
Lymphocytes Relative: 28 % (ref 12.0–46.0)
Lymphs Abs: 2.5 10*3/uL (ref 0.7–4.0)
MCHC: 32.7 g/dL (ref 30.0–36.0)
MCV: 91.2 fl (ref 78.0–100.0)
Monocytes Absolute: 0.8 10*3/uL (ref 0.1–1.0)
Monocytes Relative: 9.4 % (ref 3.0–12.0)
Neutro Abs: 5.3 10*3/uL (ref 1.4–7.7)
Neutrophils Relative %: 58.7 % (ref 43.0–77.0)
Platelets: 290 10*3/uL (ref 150.0–400.0)
RBC: 4.22 Mil/uL (ref 3.87–5.11)
RDW: 16 % — ABNORMAL HIGH (ref 11.5–15.5)
WBC: 9 10*3/uL (ref 4.0–10.5)

## 2019-03-18 LAB — COMPREHENSIVE METABOLIC PANEL
ALT: 15 U/L (ref 0–35)
AST: 16 U/L (ref 0–37)
Albumin: 3.8 g/dL (ref 3.5–5.2)
Alkaline Phosphatase: 80 U/L (ref 39–117)
BUN: 17 mg/dL (ref 6–23)
CO2: 28 mEq/L (ref 19–32)
Calcium: 9.1 mg/dL (ref 8.4–10.5)
Chloride: 104 mEq/L (ref 96–112)
Creatinine, Ser: 0.49 mg/dL (ref 0.40–1.20)
GFR: 131.47 mL/min (ref >60.00)
Glucose, Bld: 94 mg/dL (ref 70–99)
Potassium: 4 mEq/L (ref 3.5–5.1)
Sodium: 140 mEq/L (ref 135–145)
Total Bilirubin: 0.2 mg/dL (ref 0.2–1.2)
Total Protein: 6.9 g/dL (ref 6.0–8.3)

## 2019-03-18 LAB — LIPID PANEL
Cholesterol: 201 mg/dL — ABNORMAL HIGH (ref 0–200)
HDL: 45.2 mg/dL (ref >39.00)
NonHDL: 156.01
Total CHOL/HDL Ratio: 4
Triglycerides: 225 mg/dL — ABNORMAL HIGH (ref 0.0–149.0)
VLDL: 45 mg/dL — ABNORMAL HIGH (ref 0.0–40.0)

## 2019-03-18 LAB — LDL CHOLESTEROL, DIRECT: Direct LDL: 123 mg/dL

## 2019-03-18 LAB — TSH: TSH: 1.37 u[IU]/mL (ref 0.35–4.50)

## 2019-03-20 ENCOUNTER — Other Ambulatory Visit: Payer: Self-pay | Admitting: Family Medicine

## 2019-03-20 DIAGNOSIS — M1712 Unilateral primary osteoarthritis, left knee: Secondary | ICD-10-CM | POA: Diagnosis not present

## 2019-03-20 DIAGNOSIS — E785 Hyperlipidemia, unspecified: Secondary | ICD-10-CM

## 2019-03-25 NOTE — Telephone Encounter (Signed)
If patient wants to postpone her appointment by another month is okay with me

## 2019-03-25 NOTE — Telephone Encounter (Signed)
Patient is scheduled to come in on 03/28/2019 and will be taking her second dose of simponi that day. Patient would like to know if she should keep that appointment or wait a couple more weeks before coming in? Please advise.

## 2019-03-28 ENCOUNTER — Ambulatory Visit: Payer: 59 | Admitting: Physician Assistant

## 2019-04-04 ENCOUNTER — Telehealth: Payer: Self-pay | Admitting: Orthopaedic Surgery

## 2019-04-04 NOTE — Telephone Encounter (Signed)
Received call from [facility] office advised patient has an appointment Wednesday morning at 8:00am and asking what if the protocol for patient's that had surgery in July? The number to contact the dental office is [phone]    The fax# is [phone]

## 2019-04-05 NOTE — Telephone Encounter (Signed)
Faced to provided number

## 2019-04-05 NOTE — Progress Notes (Signed)
Virtual Visit via Video Note  I connected with Donna Lopez on 04/19/19 at 10:15 AM EST by a video enabled telemedicine application and verified that I am speaking with the correct person using two identifiers.  Location: Patient: Home Provider: Clinic  This service was conducted via virtual visit.  Both audio and visual tools were used.  The patient was located at home. I was located in my office.  Consent was obtained prior to the virtual visit and is aware of possible charges through their insurance for this visit.  The patient is an established patient.  Dr. Estanislado Pandy, MD conducted the virtual visit and Hazel Sams, PA-C acted as scribe during the service.  Office staff helped with scheduling follow up visits after the service was conducted.   I discussed the limitations of evaluation and management by telemedicine and the availability of in person appointments. The patient expressed understanding and agreed to proceed.  CC: Pain in both hands  History of Present Illness: Patient is a 53 year old female with a past medical history of seropositive rheumatoid arthritis. She was started on Simponi sq injections on 02/28/19.  She has noticed some improvement since switching from Humira.  She continues to have pain and inflammation in both hands and both wrist joints.  She has been unable to wear some of her rings. She continues to have pain in both feet. She is taking aleve 5-6 times per week.    Review of Systems  Constitutional: Negative for fever and malaise/fatigue.  Eyes: Negative for photophobia, pain, discharge and redness.  Respiratory: Negative for cough, shortness of breath and wheezing.   Cardiovascular: Negative for chest pain and palpitations.  Gastrointestinal: Negative for blood in stool, constipation and diarrhea.  Genitourinary: Negative for dysuria.  Musculoskeletal: Positive for joint pain. Negative for back pain, myalgias and neck pain.       +Morning stiffness   +Joint swelling   Skin: Negative for rash.  Neurological: Negative for dizziness and headaches.  Psychiatric/Behavioral: Negative for depression. The patient is not nervous/anxious and does not have insomnia.       Observations/Objective: Physical Exam  Constitutional: She is oriented to person, place, and time and well-developed, well-nourished, and in no distress.  HENT:  Head: Normocephalic and atraumatic.  Eyes: Conjunctivae are normal.  Pulmonary/Chest: Effort normal.  Neurological: She is alert and oriented to person, place, and time.  Psychiatric: Mood, memory, affect and judgment normal.   Patient reports morning stiffness for 2   hours.   Patient denies nocturnal pain.  Difficulty dressing/grooming: Denies Difficulty climbing stairs: Reports Difficulty getting out of chair: Reports Difficulty using hands for taps, buttons, cutlery, and/or writing: Reports   Assessment and Plan: Visit Diagnoses: Rheumatoid arthritis involving multiple sites with positive rheumatoid factor (HCC) - Positive rheumatoid factor, positive anti-CCP, erosive disease: She has persistent pain and inflammation in both hands and both wrist joints.  She has ongoing pain in both feet.  She noticed significant improvement while taking prednisone taper but has discontinued.  She was switched from Humira 40 mg sq injections every 14 day to Simponi 50 mg sq injections every 28 days on 02/28/19.  She has noticed some improvement since starting on Simponi. Her nocturnal pain has resolved. She continues to have morning stiffness lasting about 2 hours.  We will give this combination of medications more time to see if they will be effective.  She will follow up in 2 months to assess her response.  If she  has persistent joint pain and inflammation we will need to discuss other treatment options.   High risk medications (not anticoagulants) long-term use -  Simponi 50 mg sq injections every 28 days (started on  02/28/19), Rasuvo 20 mg every 7 days, and folic acid 1 mg 2 tablets daily.  She had an inadequate response to Humira in the past.  CBC and CMP were drawn on 02/02/2019.  TB gold was negative on 10/28/2018.  Primary osteoarthritis of right knee: She has intermittent right knee joint pain.  No joint swelling currently.   S/P total knee arthroplasty, left: Performed by Dr. Magnus Ivan on 12/17/2018.    DDD (degenerative disc disease), thoracic: No midline spinal tenderness.   DDD (degenerative disc disease), lumbar: She has no discomfort at this time.   Other medical conditions are listed as follows:   History of vitamin D deficiency  History of gastroesophageal reflux (GERD)  History of rosacea/ History of acne  History of hiatal hernia   Follow Up Instructions: She will follow up in 2 months.    I discussed the assessment and treatment plan with the patient. The patient was provided an opportunity to ask questions and all were answered. The patient agreed with the plan and demonstrated an understanding of the instructions.   The patient was advised to call back or seek an in-person evaluation if the symptoms worsen or if the condition fails to improve as anticipated.  I provided 15 minutes of non-face-to-face time during this encounter.   Pollyann Savoy, MD   Scribed by-  Sherron Ales, PA-C

## 2019-04-06 ENCOUNTER — Encounter: Payer: Self-pay | Admitting: Orthopaedic Surgery

## 2019-04-18 ENCOUNTER — Encounter: Payer: Self-pay | Admitting: Family Medicine

## 2019-04-18 ENCOUNTER — Ambulatory Visit: Payer: BC Managed Care – PPO | Admitting: Rheumatology

## 2019-04-18 DIAGNOSIS — K219 Gastro-esophageal reflux disease without esophagitis: Secondary | ICD-10-CM

## 2019-04-18 MED ORDER — ESOMEPRAZOLE MAGNESIUM 40 MG PO CPDR: 40.0000 mg | DELAYED_RELEASE_CAPSULE | Freq: Every day | ORAL | 4 refills | Status: DC

## 2019-04-19 ENCOUNTER — Telehealth (INDEPENDENT_AMBULATORY_CARE_PROVIDER_SITE_OTHER): Payer: 59 | Admitting: Rheumatology

## 2019-04-19 ENCOUNTER — Other Ambulatory Visit: Payer: Self-pay

## 2019-04-19 ENCOUNTER — Encounter: Payer: Self-pay | Admitting: Rheumatology

## 2019-04-19 DIAGNOSIS — Z79899 Other long term (current) drug therapy: Secondary | ICD-10-CM

## 2019-04-19 DIAGNOSIS — Z96652 Presence of left artificial knee joint: Secondary | ICD-10-CM

## 2019-04-19 DIAGNOSIS — M1712 Unilateral primary osteoarthritis, left knee: Secondary | ICD-10-CM | POA: Diagnosis not present

## 2019-04-19 DIAGNOSIS — Z8719 Personal history of other diseases of the digestive system: Secondary | ICD-10-CM

## 2019-04-19 DIAGNOSIS — M5136 Other intervertebral disc degeneration, lumbar region: Secondary | ICD-10-CM

## 2019-04-19 DIAGNOSIS — M5134 Other intervertebral disc degeneration, thoracic region: Secondary | ICD-10-CM

## 2019-04-19 DIAGNOSIS — M0579 Rheumatoid arthritis with rheumatoid factor of multiple sites without organ or systems involvement: Secondary | ICD-10-CM

## 2019-04-19 DIAGNOSIS — M1711 Unilateral primary osteoarthritis, right knee: Secondary | ICD-10-CM | POA: Diagnosis not present

## 2019-04-19 DIAGNOSIS — Z8639 Personal history of other endocrine, nutritional and metabolic disease: Secondary | ICD-10-CM

## 2019-04-19 DIAGNOSIS — Z872 Personal history of diseases of the skin and subcutaneous tissue: Secondary | ICD-10-CM

## 2019-04-19 MED ORDER — FOLIC ACID 1 MG PO TABS: 2.0000 mg | ORAL_TABLET | Freq: Every day | ORAL | 3 refills | Status: DC

## 2019-04-19 MED ORDER — RASUVO 20 MG/0.4ML ~~LOC~~ SOAJ: SUBCUTANEOUS | 0 refills | Status: DC

## 2019-04-19 NOTE — Addendum Note (Signed)
Addended by: Francis Gaines C on: 04/19/2019 12:15 PM   Modules accepted: Orders

## 2019-04-19 NOTE — Telephone Encounter (Signed)
Reviewed images and discussed during virtual visit today.

## 2019-04-22 ENCOUNTER — Telehealth: Payer: Self-pay

## 2019-04-22 ENCOUNTER — Encounter: Payer: Self-pay | Admitting: Rheumatology

## 2019-04-22 MED ORDER — RASUVO 20 MG/0.4ML ~~LOC~~ SOAJ: SUBCUTANEOUS | 0 refills | Status: DC

## 2019-04-22 NOTE — Telephone Encounter (Signed)
Kaylyn -- I spoke with [facility] and they said that insurance is rejecting Nexium as non-formulary and it does not given them any formulary alternatives. Have you received the PA request or are you already working on this?

## 2019-04-22 NOTE — Telephone Encounter (Signed)
PA initiated via Covermymeds; KEY: [id number]. Awaiting determination.

## 2019-04-26 NOTE — Telephone Encounter (Signed)
Pt states not wanting to change. Pt states she will find out how much the Nexium will cost her and possibly pay out of pocket. I advised we would not send in the Protonix for now.

## 2019-04-26 NOTE — Telephone Encounter (Signed)
PA denied. Pt must have tried and failed or cannot take all of the following:   Dexilant Omeprazole Pantoprazole Rabeprazole  Please advise.

## 2019-04-26 NOTE — Telephone Encounter (Signed)
protonix 40 mg #90  3 refills 1 po qd  As long as pt is ok with this----let her know her ins will not pay for nexium

## 2019-04-27 MED ORDER — PANTOPRAZOLE SODIUM 40 MG PO TBEC: 40.0000 mg | DELAYED_RELEASE_TABLET | Freq: Every day | ORAL | 3 refills | Status: DC

## 2019-04-27 NOTE — Addendum Note (Signed)
Addended by: Sanda Linger on: 04/27/2019 04:34 PM   Modules accepted: Orders

## 2019-05-26 ENCOUNTER — Other Ambulatory Visit: Payer: Self-pay

## 2019-05-26 ENCOUNTER — Ambulatory Visit: Payer: 59 | Admitting: Physician Assistant

## 2019-05-26 ENCOUNTER — Encounter: Payer: Self-pay | Admitting: Physician Assistant

## 2019-05-26 DIAGNOSIS — Z96652 Presence of left artificial knee joint: Secondary | ICD-10-CM

## 2019-05-26 NOTE — Progress Notes (Signed)
  HPI: Donna Lopez returns today now 5 months 7 days status post left total knee arthroplasty.  She states overall her knee is doing well.  She has shoulder problems going downstairs.  She has tried exercise bike at home strengthening.  She does have some tenderness over the incision she applies any pressure over the area.  Otherwise doing well.  Review of systems no fevers, chills shortness of breath or chest pain.  Physical exam: Left knee full extension flexion to approximately 110 degrees.  Slight opening with valgus stressing.None with Varus stressing.  Anterior drawer is negative.  No abnormal warmth erythema or effusion.  Slight keloid formation.  Impression: Status post left total knee arthroplasty 12/17/2018  Plan: Continue to work on quad strengthening.  We will have her also work on scar tissue mobilization Mederma.  Questions encouraged and answered at length we will see her back in 6 months at that time we will obtain AP and lateral views left knee.  Sooner if there is any questions concerns.

## 2019-06-08 ENCOUNTER — Other Ambulatory Visit: Payer: Self-pay | Admitting: Rheumatology

## 2019-06-08 DIAGNOSIS — M0579 Rheumatoid arthritis with rheumatoid factor of multiple sites without organ or systems involvement: Secondary | ICD-10-CM

## 2019-06-08 NOTE — Telephone Encounter (Signed)
Last Visit: 02/14/19 Next Visit: 06/30/19 Labs: 03/17/19 RDW 16.0 rest on CBC WNL, CMP WNL TB Gold: 10/28/18 Neg   Okay to refill per Dr. Corliss Skains

## 2019-06-28 NOTE — Progress Notes (Signed)
Office Visit Note  Patient: Donna Lopez             Date of Birth: 02-14-1966           MRN: [id number]             PCP: Donato Schultz, DO Referring: Donato Schultz, * Visit Date: 06/30/2019 Occupation: @GUAROCC @  Subjective:  Pain in multiple joints   History of Present Illness: Donna Lopez is a 54 y.o. female with history of seropositive rheumatoid arthritis and DDD.  Patient is on Simponi sq injections every 28 days, Rasuvo 20 mg subcu injections once weekly and folic acid 2 mg a mouth daily.  Her last Simponi injection was on 06/20/2019.  She states that she experiences increased joint pain and joint swelling within about 2 weeks after injecting Simponi.  She states that she typically has to take Aleve on a daily basis for the last 2 weeks.  She is currently having pain and inflammation in both hands and both feet.     Activities of Daily Living:  Patient reports morning stiffness for 5-60 minutes.   Patient Denies nocturnal pain.  Difficulty dressing/grooming: Denies Difficulty climbing stairs: Denies Difficulty getting out of chair: Denies Difficulty using hands for taps, buttons, cutlery, and/or writing: Reports  Review of Systems  Constitutional: Negative for fatigue.  HENT: Negative for mouth sores, mouth dryness and nose dryness.   Eyes: Negative for itching and dryness.  Respiratory: Negative for shortness of breath, wheezing and difficulty breathing.   Cardiovascular: Negative for chest pain and palpitations.  Gastrointestinal: Negative for blood in stool, constipation and diarrhea.  Endocrine: Negative for increased urination.  Genitourinary: Negative for difficulty urinating and painful urination.  Musculoskeletal: Positive for arthralgias, joint pain, joint swelling and morning stiffness.  Skin: Negative for rash.  Allergic/Immunologic: Negative for susceptible to infections.  Neurological: Negative for dizziness, numbness, headaches, memory  loss and weakness.  Hematological: Negative for bruising/bleeding tendency.  Psychiatric/Behavioral: Negative for confusion.    PMFS History:  Patient Active Problem List   Diagnosis Date Noted  . Unilateral primary osteoarthritis, left knee 12/17/2018  . Status post total left knee replacement 12/17/2018  . Primary osteoarthritis of left knee 11/23/2018  . Carpal tunnel syndrome, left upper limb 11/23/2018  . BMI 40.0-44.9, adult (HCC) 07/12/2018  . Primary osteoarthritis of both knees 12/24/2016  . 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 DDD (degenerative disc disease), lumbar 11/17/2016  . Vitamin D deficiency 11/17/2016  . History of rosacea/ History of acne 11/17/2016  . High risk medications (not anticoagulants) long-term use 06/25/2016  . History of degenerative disc disease 06/25/2016  . Severe obesity (BMI >= 40) (HCC) 06/13/2014  . Left-sided thoracic back pain 07/19/2013  . PALPITATIONS 10/17/2009  . UTI 05/24/2009  . Rheumatoid arthritis (HCC) 05/24/2009  . Hair loss 02/22/2009  . VAGINAL BLEEDING 10/05/2008  . Carrier of group B Streptococcus 07/28/2007  . GERD 12/28/2006  . CARDIAC MURMUR, HX OF 12/28/2006  . HIATAL HERNIA, HX OF 12/28/2006    Past Medical History:  Diagnosis Date  . Allergy   . Arthritis   . Carpal tunnel syndrome 10/2018  . DDD (degenerative disc disease), lumbar   . DDD (degenerative disc disease), thoracic   . Family history of adverse reaction to anesthesia    Mothers sister never woke up from surgery  . GERD (gastroesophageal reflux disease)   . Grade I diastolic dysfunction 2013  . Heart  murmur   . Heart palpitations    History of  . History of hiatal hernia   . OA (osteoarthritis)   . Obese   . Rheumatoid arthritis ([place])   . 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 Vitamin D deficiency     Family History  Problem Relation Age of Onset  . Uterine cancer Mother   . Diabetes Father   . Kidney cancer Father   . Colon cancer  Sister 76  . Rectal cancer Sister   . Colon cancer Paternal Grandfather   . Colon polyps Brother   . Cancer Sister        breast   . Healthy Daughter   . Esophageal cancer Neg Hx   . Stomach cancer Neg Hx    Past Surgical History:  Procedure Laterality Date  . COLONOSCOPY  2014   DB- normal   . NO PAST SURGERIES    . 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   Procedure: LEFT TOTAL KNEE ARTHROPLASTY;  Surgeon: Mcarthur Rossetti, MD;  Location: [facility];  Service: Orthopedics;  Laterality: Left;  . UPPER GASTROINTESTINAL ENDOSCOPY     Social History   Social History Narrative   Exercise--- sometimes walks 1x a week   Immunization History  Administered Date(s) Administered  . Influenza Whole 02/08/2009  . Influenza,inj,Quad PF,6+ Mos 03/09/2013, 06/13/2014, 06/16/2016  . Td 09/29/2001  . Tdap 06/13/2014     Objective: Vital Signs: BP (!) 143/89 (BP Location: Right Arm, Patient Position: Sitting, Cuff Size: Normal)   Pulse 80   Resp 15   Ht 5' 2.5" (1.588 m)   Wt 223 lb (101.2 kg)   BMI 40.14 kg/m    Physical Exam Vitals and nursing note reviewed.  Constitutional:      Appearance: She is well-developed.  HENT:     Head: Normocephalic and atraumatic.  Eyes:     Conjunctiva/sclera: Conjunctivae normal.  Cardiovascular:     Rate and Rhythm: Normal rate and regular rhythm.     Heart sounds: Normal heart sounds.  Pulmonary:     Effort: Pulmonary effort is normal.     Breath sounds: Normal breath sounds.  Abdominal:     General: Bowel sounds are normal.     Palpations: Abdomen is soft.  Musculoskeletal:     Cervical back: Normal range of motion.  Lymphadenopathy:     Cervical: No cervical adenopathy.  Skin:    General: Skin is warm and dry.     Capillary Refill: Capillary refill takes less than 2 seconds.  Neurological:     Mental Status: She is alert and oriented to person, place, and time.  Psychiatric:        Behavior: Behavior normal.       Musculoskeletal Exam: C-spine, thoracic spine, lumbar spine good range of motion.  No midline spinal tenderness.  Shoulder joints have good range of motion with no discomfort.  Elbow joints have good range of motion with no tenderness or inflammation.  She has limited flexion of both wrist joints.  She has synovial thickening, tenderness, and inflammation of both wrists.  She has tenderness and synovitis of several MCP joints as described above.  She has tenderness and synovitis of the right third PIP joint.  Hip joints have good range of motion with no discomfort.  She has warmth in the left knee replacement.  Right knee has good range of motion with no warmth or effusion.  Ankle joints have good range of motion with no tenderness or  inflammation.  She has synovitis and tenderness of the left second third and fourth MTPs and right third MTP joint.  CDAI Exam: CDAI Score: 17  Patient Global: 5 mm; Provider Global: 5 mm Swollen: 12 ; Tender: 12  Joint Exam 06/30/2019      Right  Left  Wrist  Swollen Tender  Swollen Tender  MCP 1  Swollen Tender     MCP 2  Swollen Tender  Swollen Tender  MCP 3  Swollen Tender     MCP 5     Swollen Tender  PIP 3  Swollen Tender     MTP 2     Swollen Tender  MTP 3  Swollen Tender  Swollen Tender  MTP 4     Swollen Tender     Investigation: No additional findings.  Imaging: No results found.  Recent Labs: Lab Results  Component Value Date   WBC 9.0 03/17/2019   HGB 12.6 03/17/2019   PLT 290.0 03/17/2019   NA 140 03/17/2019   K 4.0 03/17/2019   CL 104 03/17/2019   CO2 28 03/17/2019   GLUCOSE 94 03/17/2019   BUN 17 03/17/2019   CREATININE 0.49 03/17/2019   BILITOT 0.2 03/17/2019   ALKPHOS 80 03/17/2019   AST 16 03/17/2019   ALT 15 03/17/2019   PROT 6.9 03/17/2019   ALBUMIN 3.8 03/17/2019   CALCIUM 9.1 03/17/2019   GFRAA 119 02/02/2019   QFTBGOLDPLUS NEGATIVE 10/28/2018    Speciality Comments: No specialty comments  available.  Procedures:  No procedures performed Allergies: Penicillins   Assessment / Plan:     Visit Diagnoses: Rheumatoid arthritis involving multiple sites with positive rheumatoid factor (HCC) - Positive rheumatoid factor, positive anti-CCP, erosive disease: She has active synovitis and tenderness of multiple joints as described above.  She has been having significant pain and intermittent inflammation in bilateral wrist joints, bilateral hands, and bilateral feet.  She has been injecting Simponi subcutaneously every 28 days and Rasuvo 20 mg subcu once weekly.  She has not missed any doses recently.  She was started on Simponi on 02/28/2019 and her last dose was on 06/20/2019.  She reports that 2 weeks after each Simponi injection she experiences increased pain and inflammation.  She typically has to take Aleve on a daily basis for the last 2 weeks prior to her next injection.  We discussed switching her to Orencia 125 mg subcutaneous injections once weekly and continuing on Rasuvo as prescribed.  Indications, contraindications, potential side effects of Orencia were discussed.  All questions were addressed and consent was obtained today.  We will apply for Orencia through her insurance.  She will follow-up in the office in 6 weeks.  Medication counseling:  TB Gold: Negative on 10/28/2018. Hepatitis panel: Ordered today  HIV: Negative on 06/15/2015 SPEP: ordered today  Immunoglobulins: Ordered today   Does patient have a diagnosis of COPD? No  Counseled patient that Dub Amis is a selective T-cell costimulation blocker indicated for Rheumatoid arthritis. Counseled patient on purpose, proper use, and adverse effects of Orencia. The most common adverse effects are increased risk of infections, headache, and injection site reactions.  There is the possibility of an increased risk of malignancy but it is not well understood if this increased risk is due to the medication or the disease state.  Reviewed  the importance of regular labs while on Orencia therapy.  Counseled patient that Dub Amis should be held prior to scheduled surgery.  Counseled patient to avoid live vaccines while  on Orencia.  Advised patient to get annual influenza vaccine and the pneumococcal vaccine as indicated.  Provided patient with medication education material and answered all questions.  Patient consented to [facility].  Will upload consent into patient's chart.  Will apply for Orencia through patient's insurance.  Reviewed storage information for Orencia.  Advised initial injection must be administered in office.    High risk medications (not anticoagulants) long-term use -We will apply for Orencia 125 mg subcutaneous injections once weekly.  She will continue on Rasuvo 20 mg of his injections once weekly and folic acid 2 mg by mouth daily.  Inadequate response to Simponi (02/28/19-06/20/19) and Humira. We will check immunosuppressive lab work today.  She is due for CBC and CMP as well.  She will require CBC and CMP 1 month after starting on Orencia and then every 3 months.  Primary osteoarthritis of right knee: She has good range of motion no warmth or effusion.  S/P total knee arthroplasty, left - Performed by Dr. Magnus Ivan on 12/17/2018-she has warmth but no effusion on exam.  She experiences tenderness intermittently.  DDD (degenerative disc disease), thoracic: No midline spinal tenderness.    DDD (degenerative disc disease), lumbar: She experiences intermittent lower back pain.  No midline spinal tenderness.  History of vitamin D deficiency: Vitamin D was 39 on 11/21/2016.  She is taking Vitamin D 2000 units daily.  Other medical conditions are listed as follows:  History of gastroesophageal reflux (GERD)  History of rosacea/ History of acne  History of hiatal hernia  Orders: Orders Placed This Encounter  Procedures  . CBC with Differential/Platelet  . COMPLETE METABOLIC PANEL WITH GFR  . Serum protein  electrophoresis with reflex  . IgG, IgA, IgM  . Hepatitis panel, acute   No orders of the defined types were placed in this encounter.   Face-to-face time spent with patient was 30 minutes. Greater than 50% of time was spent in counseling and coordination of care.  Follow-Up Instructions: Return in about 6 weeks (around 08/11/2019) for Rheumatoid arthritis, Osteoarthritis.   Gearldine Bienenstock, PA-C   I examined and evaluated the patient with Sherron Ales PA.  Patient has not been doing well on combination of Simponi and methotrexate.  She has ongoing synovitis on examination.  I detailed discussion with the patient regarding different treatment options and their side effects.  After discussing indications side effects and contraindications she is willing to proceed with subcu Orencia.  We will apply for Orencia.  The plan of care was discussed as noted above.  Pollyann Savoy, MD  Note - This record has been created using Animal nutritionist.  Chart creation errors have been sought, but may not always  have been located. Such creation errors do not reflect on  the standard of medical care.

## 2019-06-29 ENCOUNTER — Other Ambulatory Visit: Payer: Self-pay | Admitting: Rheumatology

## 2019-06-29 DIAGNOSIS — M0579 Rheumatoid arthritis with rheumatoid factor of multiple sites without organ or systems involvement: Secondary | ICD-10-CM

## 2019-06-29 NOTE — Telephone Encounter (Signed)
Last Visit: 04/19/19 Next Visit: 06/30/19 Labs: 03/17/19 RDW 16.0  TB Gold: 10/28/18 Neg   Okay to refill 30 day supply per Dr. Corliss Skains

## 2019-06-30 ENCOUNTER — Telehealth: Payer: Self-pay | Admitting: Pharmacist

## 2019-06-30 ENCOUNTER — Ambulatory Visit: Payer: 59 | Admitting: Rheumatology

## 2019-06-30 ENCOUNTER — Encounter: Payer: Self-pay | Admitting: Rheumatology

## 2019-06-30 ENCOUNTER — Other Ambulatory Visit: Payer: Self-pay

## 2019-06-30 VITALS — BP 143/89 | HR 80 | Resp 15 | Ht 62.5 in | Wt 223.0 lb

## 2019-06-30 DIAGNOSIS — Z79899 Other long term (current) drug therapy: Secondary | ICD-10-CM

## 2019-06-30 DIAGNOSIS — Z8639 Personal history of other endocrine, nutritional and metabolic disease: Secondary | ICD-10-CM

## 2019-06-30 DIAGNOSIS — M0579 Rheumatoid arthritis with rheumatoid factor of multiple sites without organ or systems involvement: Secondary | ICD-10-CM

## 2019-06-30 DIAGNOSIS — Z96652 Presence of left artificial knee joint: Secondary | ICD-10-CM

## 2019-06-30 DIAGNOSIS — M1711 Unilateral primary osteoarthritis, right knee: Secondary | ICD-10-CM

## 2019-06-30 DIAGNOSIS — M5134 Other intervertebral disc degeneration, thoracic region: Secondary | ICD-10-CM

## 2019-06-30 DIAGNOSIS — Z8719 Personal history of other diseases of the digestive system: Secondary | ICD-10-CM

## 2019-06-30 DIAGNOSIS — M5136 Other intervertebral disc degeneration, lumbar region: Secondary | ICD-10-CM

## 2019-06-30 DIAGNOSIS — Z872 Personal history of diseases of the skin and subcutaneous tissue: Secondary | ICD-10-CM

## 2019-06-30 NOTE — Telephone Encounter (Signed)
Submitted a Prior Authorization request to [facility] for [facility] via Cover My Meds. Will update once we receive a response.  Freada BergeronAnnett Fabian) - [id number]

## 2019-06-30 NOTE — Telephone Encounter (Signed)
Received notification from [facility] regarding a prior authorization for [facility]. Authorization has been APPROVED from 06/30/19 to 06/29/20.   Will send document to scan center.  Authorization # [id number] Phone # [phone]  Per plan, patient must fill through [facility]. Patient has commercial plan and can use copay card.  3:02 PM Dorthula Nettles, CPhT

## 2019-06-30 NOTE — Telephone Encounter (Signed)
Patient advised Dub Amis has been approved. Patient has been scheduled for 07/19/19 at 10:00 am.

## 2019-06-30 NOTE — Telephone Encounter (Signed)
Please start BIV for Orencia.  Patient has tried Plaquenil, oral and injectable MTX, Humira, and Simponi with inadequate response.  If insurance does not cover Orencia, we can proceed with Actemra.  Next option would be Rinvoq.  Patient will be counseled and consented at new start visit.  She was given a handout on Orencia today.  Her next Simponi injection was due 3/1.  Will need to schedule new start visit on or after 3/1.   Verlin Fester, PharmD, [place], CPP Clinical Specialty Pharmacist [phone]  06/30/2019 11:31 AM

## 2019-07-04 LAB — CBC WITH DIFFERENTIAL/PLATELET
Absolute Monocytes: 800 cells/uL (ref 200–950)
Basophils Absolute: 37 cells/uL (ref 0–200)
Basophils Relative: 0.4 %
Eosinophils Absolute: 186 cells/uL (ref 15–500)
Eosinophils Relative: 2 %
HCT: 39.3 % (ref 35.0–45.0)
Hemoglobin: 12.9 g/dL (ref 11.7–15.5)
Lymphs Abs: 1962 cells/uL (ref 850–3900)
MCH: 28.2 pg (ref 27.0–33.0)
MCHC: 32.8 g/dL (ref 32.0–36.0)
MCV: 86 fL (ref 80.0–100.0)
MPV: 9.8 fL (ref 7.5–12.5)
Monocytes Relative: 8.6 %
Neutro Abs: 6315 cells/uL (ref 1500–7800)
Neutrophils Relative %: 67.9 %
Platelets: 364 10*3/uL (ref 140–400)
RBC: 4.57 10*6/uL (ref 3.80–5.10)
RDW: 15.6 % — ABNORMAL HIGH (ref 11.0–15.0)
Total Lymphocyte: 21.1 %
WBC: 9.3 10*3/uL (ref 3.8–10.8)

## 2019-07-04 LAB — COMPLETE METABOLIC PANEL WITH GFR
AG Ratio: 1.2 (calc) (ref 1.0–2.5)
ALT: 11 U/L (ref 6–29)
AST: 13 U/L (ref 10–35)
Albumin: 3.8 g/dL (ref 3.6–5.1)
Alkaline phosphatase (APISO): 82 U/L (ref 37–153)
BUN/Creatinine Ratio: 38 (calc) — ABNORMAL HIGH (ref 6–22)
BUN: 17 mg/dL (ref 7–25)
CO2: 27 mmol/L (ref 20–32)
Calcium: 9.2 mg/dL (ref 8.6–10.4)
Chloride: 103 mmol/L (ref 98–110)
Creat: 0.45 mg/dL — ABNORMAL LOW (ref 0.50–1.05)
GFR, Est African American: 132 mL/min/{1.73_m2} (ref >=60)
GFR, Est Non African American: 114 mL/min/{1.73_m2} (ref >=60)
Globulin: 3.3 g/dL (calc) (ref 1.9–3.7)
Glucose, Bld: 83 mg/dL (ref 65–99)
Potassium: 4.3 mmol/L (ref 3.5–5.3)
Sodium: 138 mmol/L (ref 135–146)
Total Bilirubin: 0.3 mg/dL (ref 0.2–1.2)
Total Protein: 7.1 g/dL (ref 6.1–8.1)

## 2019-07-04 LAB — PROTEIN ELECTROPHORESIS, SERUM, WITH REFLEX
Albumin ELP: 3.5 g/dL — ABNORMAL LOW (ref 3.8–4.8)
Alpha 1: 0.4 g/dL — ABNORMAL HIGH (ref 0.2–0.3)
Alpha 2: 1 g/dL — ABNORMAL HIGH (ref 0.5–0.9)
Beta 2: 0.7 g/dL — ABNORMAL HIGH (ref 0.2–0.5)
Beta Globulin: 0.6 g/dL (ref 0.4–0.6)
Gamma Globulin: 1.2 g/dL (ref 0.8–1.7)
Total Protein: 7.3 g/dL (ref 6.1–8.1)

## 2019-07-04 LAB — HEPATITIS PANEL, ACUTE
Hep A IgM: NONREACTIVE
Hep B C IgM: NONREACTIVE
Hepatitis B Surface Ag: NONREACTIVE
Hepatitis C Ab: NONREACTIVE
SIGNAL TO CUT-OFF: 0.02 (ref <1.00)

## 2019-07-04 LAB — IGG, IGA, IGM
IgG (Immunoglobin G), Serum: 1229 mg/dL (ref 600–1640)
IgM, Serum: 247 mg/dL (ref 50–300)
Immunoglobulin A: 672 mg/dL — ABNORMAL HIGH (ref 47–310)

## 2019-07-04 NOTE — Progress Notes (Signed)
Labs are available now and stable.  Okay to start on Orencia.

## 2019-07-08 ENCOUNTER — Telehealth: Payer: Self-pay | Admitting: Rheumatology

## 2019-07-08 NOTE — Telephone Encounter (Signed)
Prescription to be sent to the pharmacy at her new start visit on 07/19/19.

## 2019-07-08 NOTE — Telephone Encounter (Signed)
Marcelino Duster from [facility] specialty pharmacy left a voicemail stating patient's prior authorization for Dub Amis has been approved.  Please call or fax prescription to our office.  Phone #[phone]  Fax #[phone]

## 2019-07-19 ENCOUNTER — Ambulatory Visit: Payer: 59

## 2019-07-19 ENCOUNTER — Other Ambulatory Visit: Payer: Self-pay

## 2019-07-19 ENCOUNTER — Ambulatory Visit (INDEPENDENT_AMBULATORY_CARE_PROVIDER_SITE_OTHER): Payer: 59 | Admitting: Pharmacist

## 2019-07-19 VITALS — BP 120/92 | HR 86

## 2019-07-19 DIAGNOSIS — M0579 Rheumatoid arthritis with rheumatoid factor of multiple sites without organ or systems involvement: Secondary | ICD-10-CM

## 2019-07-19 MED ORDER — ORENCIA CLICKJECT 125 MG/ML ~~LOC~~ SOAJ: 125.0000 mg | SUBCUTANEOUS | 0 refills | Status: DC

## 2019-07-19 NOTE — Progress Notes (Signed)
Pharmacy Note  Subjective:   Patient is being initiated on Orencia.  Patient was previously counseled extensively on and consented to initiation of Orencia at that time.  Patient presents to clinic today to receive the first dose of Orencia Clickject.     Objective: CMP     Component Value Date/Time   NA 138 06/30/2019 1131   K 4.3 06/30/2019 1131   CL 103 06/30/2019 1131   CO2 27 06/30/2019 1131   GLUCOSE 83 06/30/2019 1131   BUN 17 06/30/2019 1131   CREATININE 0.45 (L) 06/30/2019 1131   CALCIUM 9.2 06/30/2019 1131   PROT 7.1 06/30/2019 1131   PROT 7.3 06/30/2019 1131   ALBUMIN 3.8 03/17/2019 1439   AST 13 06/30/2019 1131   ALT 11 06/30/2019 1131   ALKPHOS 80 03/17/2019 1439   BILITOT 0.3 06/30/2019 1131   GFRNONAA 114 06/30/2019 1131   GFRAA 132 06/30/2019 1131    CBC    Component Value Date/Time   WBC 9.3 06/30/2019 1131   RBC 4.57 06/30/2019 1131   HGB 12.9 06/30/2019 1131   HCT 39.3 06/30/2019 1131   PLT 364 06/30/2019 1131   MCV 86.0 06/30/2019 1131   MCH 28.2 06/30/2019 1131   MCHC 32.8 06/30/2019 1131   RDW 15.6 (H) 06/30/2019 1131   LYMPHSABS 1,962 06/30/2019 1131   MONOABS 0.8 03/17/2019 1439   EOSABS 186 06/30/2019 1131   BASOSABS 37 06/30/2019 1131    Baseline Immunosuppressant Therapy Labs TB GOLD Quantiferon TB Gold Latest Ref Rng & Units 10/28/2018  Quantiferon TB Gold Plus NEGATIVE NEGATIVE   Hepatitis Panel Hepatitis Latest Ref Rng & Units 06/30/2019  Hep B Surface Ag NON-REACTI NON-REACTIVE  Hep B IgM NON-REACTI NON-REACTIVE  Hep C Ab NON-REACTI NON-REACTIVE  Hep C Ab NON-REACTI NON-REACTIVE  Hep A IgM NON-REACTI NON-REACTIVE   HIV Lab Results  Component Value Date   HIV NONREACTIVE 06/15/2015   Immunoglobulins Immunoglobulin Electrophoresis Latest Ref Rng & Units 06/30/2019  IgA  47 - 310 mg/dL 810(F)  IgG 751 - 0,258 mg/dL 5,277  IgM 50 - 824 mg/dL 235   SPEP Serum Protein Electrophoresis Latest Ref Rng & Units 06/30/2019  Total  Protein 6.1 - 8.1 g/dL 7.3  Albumin 3.8 - 4.8 g/dL 3.6(R)  Alpha-1 0.2 - 0.3 g/dL 4.4(R)  Alpha-2 0.5 - 0.9 g/dL 1.0(H)  Beta Globulin 0.4 - 0.6 g/dL 0.6  Beta 2 0.2 - 0.5 g/dL 1.5(Q)  Gamma Globulin 0.8 - 1.7 g/dL 1.2   M0QQ No results found for: G6PDH TPMT No results found for: TPMT   Chest x-ray 10/08/2011- Enlargement of cardiac silhouette with decreased lung volumes and increased bibasilar atelectasis  Patient running a fever or have signs/symptoms of infection? No  Assessment/Plan:  Demonstrated proper injection technique with Orencia demo pen.  Patient able to demonstrate proper injection technique using the teach back method.  Patient self injected in the right upper thigh with:  Sample Medication: Orencia Clickject x 2 Lot: [id number] Expiration: 06/2020  Patient tolerated well.  Observed for 30 mins in office for adverse reaction and none noted.   Patient is to return in 1 month for follow-up and labs. Standing orders placed. Prescription sent to [facility] required by insurance.  She is eligible to use co-pay card and given instructions to enroll online.  All questions encouraged and answered.  Instructed patient to call with any further questions or concerns.  Verlin Fester, PharmD, [place], CPP Clinical Specialty Pharmacist [phone]  07/19/2019 11:12  AM    

## 2019-07-19 NOTE — Patient Instructions (Signed)
Helpful Tips for Injecting To help alleviate pain and injection site reactions consider the following tips:  . Placing something cold (like and ice gel pack or cold water bottle) on the injection site just before cleansing with alcohol may help reduce pain . If you have a localized reaction (redness, mild swelling, warmth, and itching) you can use topical corticosteroids (hydrocortisone cream) or antihistamine (Claritin) to help minimize reaction the day of, the day before, and the day after injecting . Always inject this medication at room temperature (remove from refrigerator 15-20 minutes before injecting; this may help eliminate stinging). . Always rotate your injection sites using inside/outside thigh of both legs, abdomen divided into 4 quadrants (stay away from waist line, and 2" away from navel). . Do not inject into areas where skin is tender, bruised, red, or hard or where there are scars or stretch marks. . Always let alcohol dry on the skin before injecting. . Place a cold, damp towel or small ice pack on the injection site for 10 or 15 minutes every 1 to 2 hours if it hurts or is swollen.  

## 2019-07-21 ENCOUNTER — Other Ambulatory Visit: Payer: Self-pay | Admitting: Rheumatology

## 2019-07-21 NOTE — Telephone Encounter (Signed)
Last Visit: 07/19/19 Next Visit: 08/23/19 Labs: 06/30/19 stable   Okay to refill per Dr. Corliss Skains

## 2019-08-16 NOTE — Progress Notes (Signed)
Office Visit Note  Patient: Donna Lopez             Date of Birth: [birth year]           MRN: [id number]             PCP: Donato Schultz, DO Referring: Donato Schultz, * Visit Date: 08/23/2019 Occupation: @GUAROCC @  Subjective:  Pain in multiple joints   History of Present Illness: Donna Lopez is a 54 y.o. female with history of seropositive rheumatoid arthritis and osteoarthritis.  She previously had an inadequate response to Humira and Simponi.  Her last Simponi injection was on 06/20/2019.  She was started on Orencia 125 mg subcutaneous injections on 07/19/2019.  She is due for her fifth Orencia injection today.  She continues to inject methotrexate 20 mg subcutaneously every week and folic acid 2 mg by mouth daily.  She has not noticed any improvement since starting on Orencia.  She has actually developed increased pain and inflammation in joints that she was not complaining of prior.  She is having increased discomfort in her neck, wrist joints, both hands, both knees, and both feet.  She has persistent swelling in both hands and has noticed increased inflammation in the left knee which is replaced.  She has also noticed trochanter bursitis of the left hip.  She has been having to take Aleve on a daily basis for pain relief.   Activities of Daily Living:  Patient reports joint stiffness all day Patient Denies nocturnal pain.  Difficulty dressing/grooming: Reports Difficulty climbing stairs: Denies Difficulty getting out of chair: Denies Difficulty using hands for taps, buttons, cutlery, and/or writing: Reports  Review of Systems  Constitutional: Positive for fatigue.  HENT: Negative for mouth sores, mouth dryness and nose dryness.   Eyes: Negative for pain, visual disturbance and dryness.  Respiratory: Negative for cough, hemoptysis, shortness of breath and difficulty breathing.   Cardiovascular: Positive for swelling in legs/feet. Negative for chest pain,  palpitations and hypertension.  Gastrointestinal: Negative for blood in stool, constipation and diarrhea.  Endocrine: Negative for increased urination.  Genitourinary: Negative for difficulty urinating and painful urination.  Musculoskeletal: Positive for arthralgias, joint pain, joint swelling and morning stiffness. Negative for myalgias, muscle weakness, muscle tenderness and myalgias.  Skin: Negative for color change, pallor, rash, hair loss, nodules/bumps, skin tightness, ulcers and sensitivity to sunlight.  Allergic/Immunologic: Negative for susceptible to infections.  Neurological: Negative for dizziness, numbness, headaches and weakness.  Hematological: Negative for bruising/bleeding tendency and swollen glands.  Psychiatric/Behavioral: Negative for depressed mood and sleep disturbance. The patient is not nervous/anxious.     PMFS History:  Patient Active Problem List   Diagnosis Date Noted  . Unilateral primary osteoarthritis, left knee 12/17/2018  . Status post total left knee replacement 12/17/2018  . Primary osteoarthritis of left knee 11/23/2018  . Carpal tunnel syndrome, left upper limb 11/23/2018  . BMI 40.0-44.9, adult (HCC) 07/12/2018  . Primary osteoarthritis of both knees 12/24/2016  . 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 DDD (degenerative disc disease), lumbar 11/17/2016  . Vitamin D deficiency 11/17/2016  . History of rosacea/ History of acne 11/17/2016  . High risk medications (not anticoagulants) long-term use 06/25/2016  . History of degenerative disc disease 06/25/2016  . Severe obesity (BMI >= 40) (HCC) 06/13/2014  . Left-sided thoracic back pain 07/19/2013  . PALPITATIONS 10/17/2009  . UTI 05/24/2009  . Rheumatoid arthritis (HCC) 05/24/2009  . Hair loss 02/22/2009  .  VAGINAL BLEEDING 10/05/2008  . Carrier of group B Streptococcus 07/28/2007  . GERD 12/28/2006  . CARDIAC MURMUR, HX OF 12/28/2006  . HIATAL HERNIA, HX OF 12/28/2006      Past Medical History:  Diagnosis Date  . Allergy   . Arthritis   . Carpal tunnel syndrome 10/2018  . DDD (degenerative disc disease), lumbar   . DDD (degenerative disc disease), thoracic   . Family history of adverse reaction to anesthesia    Mothers sister never woke up from surgery  . GERD (gastroesophageal reflux disease)   . Grade I diastolic dysfunction 2013  . Heart murmur   . Heart palpitations    History of  . History of hiatal hernia   . OA (osteoarthritis)   . Obese   . Rheumatoid arthritis (HCC)   . 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 Vitamin D deficiency     Family History  Problem Relation Age of Onset  . Uterine cancer Mother   . Diabetes Father   . Kidney cancer Father   . Colon cancer Sister 65  . Rectal cancer Sister   . Colon cancer Paternal Grandfather   . Colon polyps Brother   . Cancer Sister        breast   . Healthy Daughter   . Esophageal cancer Neg Hx   . Stomach cancer Neg Hx    Past Surgical History:  Procedure Laterality Date  . COLONOSCOPY  2014   DB- normal   . KNEE ARTHROPLASTY    . 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   Procedure: LEFT TOTAL KNEE ARTHROPLASTY;  Surgeon: Kathryne Hitch, MD;  Location: [facility];  Service: Orthopedics;  Laterality: Left;  . UPPER GASTROINTESTINAL ENDOSCOPY     Social History   Social History Narrative   Exercise--- sometimes walks 1x a week   Immunization History  Administered Date(s) Administered  . Influenza Whole 02/08/2009  . Influenza,inj,Quad PF,6+ Mos 03/09/2013, 06/13/2014, 06/16/2016  . Td 09/29/2001  . Tdap 06/13/2014     Objective: Vital Signs: BP 131/80 (BP Location: Left Arm, Patient Position: Sitting, Cuff Size: Normal)   Pulse 80   Resp 16   Ht 5\' 2"  (1.575 m)   Wt 221 lb (100.2 kg)   BMI 40.42 kg/m    Physical Exam Vitals and nursing note reviewed.  Constitutional:      Appearance: She is well-developed.  HENT:     Head: Normocephalic and atraumatic.  Eyes:      Conjunctiva/sclera: Conjunctivae normal.  Pulmonary:     Effort: Pulmonary effort is normal.  Abdominal:     General: Bowel sounds are normal.     Palpations: Abdomen is soft.  Musculoskeletal:     Cervical back: Normal range of motion.  Lymphadenopathy:     Cervical: No cervical adenopathy.  Skin:    General: Skin is warm and dry.     Capillary Refill: Capillary refill takes less than 2 seconds.  Neurological:     Mental Status: She is alert and oriented to person, place, and time.  Psychiatric:        Behavior: Behavior normal.      Musculoskeletal Exam: C-spine has limited and painful lateral rotation bilaterally.  Thoracic lumbar spine good range of motion.  No midline spinal tenderness.  Shoulder joints have good range of motion with no discomfort.  Elbow joints have good range of motion no tenderness or inflammation.  Wrist joints have limited range of motion.  She has thickening over  bilateral ulnar styloids.  Tenderness and synovitis of the right 3rd and 5th MCPs and 3rd PIP joint.  Tenderness and synovitis of the left second and fifth MCPs and left third PIP.  She has tenderness and synovitis of the left wrist.  Hip joints have good range of motion with no discomfort.  She has tenderness over the right trochanteric bursa.  Right knee has good range of motion with no warmth or effusion.  Left knee replacement has warmth and inflammation.  Ankle joints have good range of motion with no tenderness or synovitis.  She has tenderness and synovitis of the right second and third MTP joints. CDAI Exam: CDAI Score: 16.6  Patient Global: 8 mm; Provider Global: 8 mm Swollen: 10 ; Tender: 10  Joint Exam 08/23/2019      Right  Left  Wrist     Swollen Tender  MCP 2     Swollen Tender  MCP 3  Swollen Tender     MCP 5  Swollen Tender  Swollen Tender  PIP 3  Swollen Tender  Swollen Tender  Cervical Spine   Tender     Knee     Swollen   MTP 2  Swollen Tender     MTP 3  Swollen Tender         Investigation: No additional findings.  Imaging: No results found.  Recent Labs: Lab Results  Component Value Date   WBC 9.3 06/30/2019   HGB 12.9 06/30/2019   PLT 364 06/30/2019   NA 138 06/30/2019   K 4.3 06/30/2019   CL 103 06/30/2019   CO2 27 06/30/2019   GLUCOSE 83 06/30/2019   BUN 17 06/30/2019   CREATININE 0.45 (L) 06/30/2019   BILITOT 0.3 06/30/2019   ALKPHOS 80 03/17/2019   AST 13 06/30/2019   ALT 11 06/30/2019   PROT 7.1 06/30/2019   PROT 7.3 06/30/2019   ALBUMIN 3.8 03/17/2019   CALCIUM 9.2 06/30/2019   GFRAA 132 06/30/2019   QFTBGOLDPLUS NEGATIVE 10/28/2018    Speciality Comments: No specialty comments available.  Procedures:  No procedures performed Allergies: Penicillins   Assessment / Plan:     Visit Diagnoses: Rheumatoid arthritis involving multiple sites with positive rheumatoid factor (HCC) - Positive rheumatoid factor, positive anti-CCP, erosive disease: She has tenderness and synovitis of multiple joints as described above.  She has noticed increased pain and inflammation in multiple joints over the past several weeks.  Her joint stiffness has been lasting all day and she has had increased nocturnal pain.  She previously had an inadequate response to Humira and Simponi subcutaneous injections.  Her last Simponi injection was on 06/20/2019.  She was started on Orencia 125 mg sq injections on 07/19/2019.  She is due for her fifth Orencia injection today.  She continues to inject methotrexate 20 mg subcutaneously every week.  She has not missed any doses recently.  We discussed increasing the dose of methotrexate to 22.5 mg subcutaneous injections once weekly for 2 weeks and if labs are stable at that time she will increase to 25 mg subcutaneous injections once weekly.  She was provided 2 samples of MTX 22.5 mg sq injections today in the office.  We will notify her once we receive samples for MTX 25 mg sq injections.  She will return for lab work in 2 weeks  x2. She will continue on Orencia as prescribed.  A prednisone taper starting at 20 mg tapering by 5 mg every 4 days was sent to  the pharmacy. She will follow up in 4 weeks.   High risk medications (not anticoagulants) long-term use - Orencia 125 mg sq injections once weekly and methotrexate subcu weekly injections.  She will be increase methotrexate from 20 mg to 22.5 milligram subcutaneous injections once weekly if labs are stable in 2 weeks she will increase to 25 mg obvious injections once weekly. Inadequate response to Simponi and Humira in the past.  TB gold negative on 10/28/2018.  She will be due to update TB Gold in June 2021.  CBC and CMP were drawn on 06/30/2019.  Lab work was stable at that time.  We will update CBC and CMP today.  She will return for lab work in 2 weeks x 2.  Standing orders are in place.- Plan: CBC with Differential/Platelet, COMPLETE METABOLIC PANEL WITH GFR  Primary osteoarthritis of right knee: She has been experiencing increased discomfort in the right knee joint.  She has good range of motion with no warmth or effusion on exam today.  S/P total knee arthroplasty, left - Performed by Dr. Ninfa Linden on 12/17/2018: She has been experiencing increased pain and intermittent inflammation in the left knee which was replaced.  No erythema was noted.  She has not had any fevers or chills recently.  She has good range of motion on exam today.  DDD (degenerative disc disease), thoracic: No midline spinal tenderness.  DDD (degenerative disc disease), lumbar: She has good range of motion with no discomfort.  No midline spinal tenderness.  History of vitamin D deficiency: She is taking calcium and vitamin D supplement.  Other medical conditions are listed as follows:  History of gastroesophageal reflux (GERD)  History of rosacea/ History of acne  History of hiatal hernia  Orders: Orders Placed This Encounter  Procedures  . CBC with Differential/Platelet  . COMPLETE METABOLIC  PANEL WITH GFR  . QuantiFERON-TB Gold Plus   Meds ordered this encounter  Medications  . predniSONE (DELTASONE) 5 MG tablet    Sig: Take 4 tablets by mouth daily x4 days, 3 tablets by mouth daily x4 days, 2 tablets by mouth daily x4 days, 1 tablet by mouth daily x4 days.    Dispense:  40 tablet    Refill:  0     Follow-Up Instructions: Return in about 4 weeks (around 09/20/2019) for Rheumatoid arthritis, Osteoarthritis.   Ofilia Neas, PA-C  Note - This record has been created using Dragon software.  Chart creation errors have been sought, but may not always  have been located. Such creation errors do not reflect on  the standard of medical care.

## 2019-08-23 ENCOUNTER — Other Ambulatory Visit: Payer: Self-pay

## 2019-08-23 ENCOUNTER — Ambulatory Visit: Payer: 59 | Admitting: Physician Assistant

## 2019-08-23 ENCOUNTER — Encounter: Payer: Self-pay | Admitting: Physician Assistant

## 2019-08-23 VITALS — BP 131/80 | HR 80 | Resp 16 | Ht 62.0 in | Wt 221.0 lb

## 2019-08-23 DIAGNOSIS — M5136 Other intervertebral disc degeneration, lumbar region: Secondary | ICD-10-CM

## 2019-08-23 DIAGNOSIS — Z79899 Other long term (current) drug therapy: Secondary | ICD-10-CM | POA: Diagnosis not present

## 2019-08-23 DIAGNOSIS — Z872 Personal history of diseases of the skin and subcutaneous tissue: Secondary | ICD-10-CM

## 2019-08-23 DIAGNOSIS — Z8639 Personal history of other endocrine, nutritional and metabolic disease: Secondary | ICD-10-CM

## 2019-08-23 DIAGNOSIS — M5134 Other intervertebral disc degeneration, thoracic region: Secondary | ICD-10-CM

## 2019-08-23 DIAGNOSIS — M1711 Unilateral primary osteoarthritis, right knee: Secondary | ICD-10-CM

## 2019-08-23 DIAGNOSIS — M0579 Rheumatoid arthritis with rheumatoid factor of multiple sites without organ or systems involvement: Secondary | ICD-10-CM

## 2019-08-23 DIAGNOSIS — Z96652 Presence of left artificial knee joint: Secondary | ICD-10-CM

## 2019-08-23 DIAGNOSIS — Z8719 Personal history of other diseases of the digestive system: Secondary | ICD-10-CM

## 2019-08-23 LAB — CBC WITH DIFFERENTIAL/PLATELET
Absolute Monocytes: 686 cells/uL (ref 200–950)
Basophils Absolute: 28 cells/uL (ref 0–200)
Basophils Relative: 0.3 %
Eosinophils Absolute: 188 cells/uL (ref 15–500)
Eosinophils Relative: 2 %
HCT: 39.1 % (ref 35.0–45.0)
Hemoglobin: 12.7 g/dL (ref 11.7–15.5)
Lymphs Abs: 2115 cells/uL (ref 850–3900)
MCH: 28 pg (ref 27.0–33.0)
MCHC: 32.5 g/dL (ref 32.0–36.0)
MCV: 86.3 fL (ref 80.0–100.0)
MPV: 10.1 fL (ref 7.5–12.5)
Monocytes Relative: 7.3 %
Neutro Abs: 6383 cells/uL (ref 1500–7800)
Neutrophils Relative %: 67.9 %
Platelets: 308 10*3/uL (ref 140–400)
RBC: 4.53 10*6/uL (ref 3.80–5.10)
RDW: 15.7 % — ABNORMAL HIGH (ref 11.0–15.0)
Total Lymphocyte: 22.5 %
WBC: 9.4 10*3/uL (ref 3.8–10.8)

## 2019-08-23 LAB — COMPLETE METABOLIC PANEL WITH GFR
AG Ratio: 1.1 (calc) (ref 1.0–2.5)
ALT: 19 U/L (ref 6–29)
AST: 21 U/L (ref 10–35)
Albumin: 3.8 g/dL (ref 3.6–5.1)
Alkaline phosphatase (APISO): 91 U/L (ref 37–153)
BUN/Creatinine Ratio: 40 (calc) — ABNORMAL HIGH (ref 6–22)
BUN: 19 mg/dL (ref 7–25)
CO2: 28 mmol/L (ref 20–32)
Calcium: 9.5 mg/dL (ref 8.6–10.4)
Chloride: 104 mmol/L (ref 98–110)
Creat: 0.47 mg/dL — ABNORMAL LOW (ref 0.50–1.05)
GFR, Est African American: 130 mL/min/{1.73_m2} (ref >=60)
GFR, Est Non African American: 112 mL/min/{1.73_m2} (ref >=60)
Globulin: 3.4 g/dL (calc) (ref 1.9–3.7)
Glucose, Bld: 86 mg/dL (ref 65–99)
Potassium: 4.3 mmol/L (ref 3.5–5.3)
Sodium: 140 mmol/L (ref 135–146)
Total Bilirubin: 0.4 mg/dL (ref 0.2–1.2)
Total Protein: 7.2 g/dL (ref 6.1–8.1)

## 2019-08-23 MED ORDER — PREDNISONE 5 MG PO TABS: ORAL_TABLET | ORAL | 0 refills | Status: DC

## 2019-08-23 NOTE — Patient Instructions (Signed)
Standing Labs We placed an order today for your standing lab work.    Please come back and get your standing labs in 2 weeks  We have open lab daily Monday through Thursday from 8:30-12:30 PM and 1:30-4:30 PM and Friday from 8:30-12:30 PM and 1:30 -4:00 PM at the office of Dr. Shaili Deveshwar.   You may experience shorter wait times on Monday and Friday afternoons. The office is located at [address], [address], [place], [place] [address] No appointment is necessary.   Labs are drawn by [facility].  You may receive a bill from [facility] for your lab work.  If you wish to have your labs drawn at another location, please call the office 24 hours in advance to send orders.  If you have any questions regarding directions or hours of operation,  please call [phone].   Just as a reminder please drink plenty of water prior to coming for your lab work. Thanks!   

## 2019-08-23 NOTE — Progress Notes (Signed)
Medication Samples have been provided to the patient.  Drug name:otrexup  Strength: 22.5mg        Qty: 2 LOT: [id number] Exp.Date: 02/2020  Dosing instructions: Inject 1 pen into the skin once weekly.   The patient has been instructed regarding the correct time, dose, and frequency of taking this medication, including desired effects and most common side effects.   Donna Lopez 10:44 AM 08/23/2019

## 2019-08-24 NOTE — Progress Notes (Signed)
CBC and CMP stable.

## 2019-08-26 ENCOUNTER — Ambulatory Visit: Payer: 59 | Attending: Oncology

## 2019-08-26 DIAGNOSIS — Z23 Encounter for immunization: Secondary | ICD-10-CM

## 2019-08-26 NOTE — Progress Notes (Signed)
   Covid-19 Vaccination Clinic  Name:  Donna Lopez    MRN: [id number] DOB: 1965/12/12  08/26/2019  [name] was observed post Covid-19 immunization for 15 minutes without incident. She was provided with Vaccine Information Sheet and instruction to access the V-Safe system.   [name] was instructed to call 911 with any severe reactions post vaccine: Marland Kitchen Difficulty breathing  . Swelling of face and throat  . A fast heartbeat  . A bad rash all over body  . Dizziness and weakness   Immunizations Administered    Name Date Dose VIS Date Route   Pfizer COVID-19 Vaccine 08/26/2019 12:00 PM 0.3 mL 04/29/2019 Intramuscular   Manufacturer: [employer], [employer]   Lot: [id number]   NDC: [address]-2

## 2019-09-14 ENCOUNTER — Other Ambulatory Visit: Payer: Self-pay

## 2019-09-14 DIAGNOSIS — Z79899 Other long term (current) drug therapy: Secondary | ICD-10-CM

## 2019-09-15 LAB — CBC WITH DIFFERENTIAL/PLATELET
Absolute Monocytes: 816 cells/uL (ref 200–950)
Basophils Absolute: 34 cells/uL (ref 0–200)
Basophils Relative: 0.4 %
Eosinophils Absolute: 213 cells/uL (ref 15–500)
Eosinophils Relative: 2.5 %
HCT: 38.6 % (ref 35.0–45.0)
Hemoglobin: 12.4 g/dL (ref 11.7–15.5)
Lymphs Abs: 2448 cells/uL (ref 850–3900)
MCH: 28 pg (ref 27.0–33.0)
MCHC: 32.1 g/dL (ref 32.0–36.0)
MCV: 87.1 fL (ref 80.0–100.0)
MPV: 10.4 fL (ref 7.5–12.5)
Monocytes Relative: 9.6 %
Neutro Abs: 4990 cells/uL (ref 1500–7800)
Neutrophils Relative %: 58.7 %
Platelets: 318 10*3/uL (ref 140–400)
RBC: 4.43 10*6/uL (ref 3.80–5.10)
RDW: 15.9 % — ABNORMAL HIGH (ref 11.0–15.0)
Total Lymphocyte: 28.8 %
WBC: 8.5 10*3/uL (ref 3.8–10.8)

## 2019-09-15 LAB — COMPLETE METABOLIC PANEL WITH GFR
AG Ratio: 1.1 (calc) (ref 1.0–2.5)
ALT: 10 U/L (ref 6–29)
AST: 13 U/L (ref 10–35)
Albumin: 3.6 g/dL (ref 3.6–5.1)
Alkaline phosphatase (APISO): 83 U/L (ref 37–153)
BUN: 20 mg/dL (ref 7–25)
CO2: 27 mmol/L (ref 20–32)
Calcium: 8.9 mg/dL (ref 8.6–10.4)
Chloride: 103 mmol/L (ref 98–110)
Creat: 0.6 mg/dL (ref 0.50–1.05)
GFR, Est African American: 120 mL/min/{1.73_m2} (ref >=60)
GFR, Est Non African American: 103 mL/min/{1.73_m2} (ref >=60)
Globulin: 3.2 g/dL (calc) (ref 1.9–3.7)
Glucose, Bld: 92 mg/dL (ref 65–99)
Potassium: 4.3 mmol/L (ref 3.5–5.3)
Sodium: 140 mmol/L (ref 135–146)
Total Bilirubin: 0.3 mg/dL (ref 0.2–1.2)
Total Protein: 6.8 g/dL (ref 6.1–8.1)

## 2019-09-15 NOTE — Progress Notes (Signed)
CMP and CMP WNL

## 2019-09-16 NOTE — Progress Notes (Signed)
Office Visit Note  Patient: Donna Lopez             Date of Birth: 1965/10/09           MRN: [id number]             PCP: [name], DO Referring: [name], * Visit Date: 09/22/2019 Occupation: @GUAROCC @  Subjective:  Pain in both hands   History of Present Illness: Donna Lopez is a 54 y.o. female with history of seropositive rheumatoid arthritis, osteoarthritis, and DDD.  Patient is on Orencia 125 mg subcutaneous injections once weekly, Rasuvo 20 mg sq injections once weekly, and folic acid 2 mg by mouth daily.  She was started on Orencia on 07/19/2019.  She has not missed any doses of Orencia recently.  After her last office visit visit on 08/23/2019 she took Rasuvo 22.5 mg subcutaneous injections for 2 weeks while taking a prednisone taper.  She noticed a significant improvement in her joint pain and inflammation on combination therapy.  She has resumed Rasuvo 20 mg subcutaneous injections once weekly due to running out of samples of the higher dose.  She states that overall she has noticed about a 50 to 60% improvement since starting on Orencia.  She continues to have pain in both hands, both wrist joints, right knee, and both ankles.  She states that overall the pain in her feet and ankle joints has been improving.  She states that her lower back pain is also been improving.  She has intermittent neck pain and stiffness.  She denies any symptoms of radiculopathy.    Activities of Daily Living:  Patient reports morning stiffness for 20-30 minutes.   Patient Denies nocturnal pain.  Difficulty dressing/grooming: Denies Difficulty climbing stairs: Denies Difficulty getting out of chair: Denies Difficulty using hands for taps, buttons, cutlery, and/or writing: Reports  Review of Systems  Constitutional: Positive for fatigue.  HENT: Negative for mouth sores, mouth dryness and nose dryness.   Eyes: Negative for pain, itching, visual disturbance and dryness.   Respiratory: Negative for cough, hemoptysis, shortness of breath and difficulty breathing.   Cardiovascular: Negative for chest pain, palpitations, hypertension and swelling in legs/feet.  Gastrointestinal: Negative for blood in stool, constipation and diarrhea.  Endocrine: Negative for increased urination.  Genitourinary: Negative for difficulty urinating and painful urination.  Musculoskeletal: Positive for arthralgias, joint pain, joint swelling and morning stiffness. Negative for myalgias, muscle weakness, muscle tenderness and myalgias.  Skin: Negative for color change, pallor, rash, hair loss, nodules/bumps, redness, skin tightness, ulcers and sensitivity to sunlight.  Allergic/Immunologic: Negative for susceptible to infections.  Neurological: Negative for numbness, headaches, memory loss and weakness.  Hematological: Negative for swollen glands.  Psychiatric/Behavioral: Negative for depressed mood, confusion and sleep disturbance. The patient is not nervous/anxious.     PMFS History:  Patient Active Problem List   Diagnosis Date Noted  . Unilateral primary osteoarthritis, left knee 12/17/2018  . Status post total left knee replacement 12/17/2018  . Primary osteoarthritis of left knee 11/23/2018  . Carpal tunnel syndrome, left upper limb 11/23/2018  . BMI 40.0-44.9, adult ([place]) 07/12/2018  . Primary osteoarthritis of both knees 12/24/2016  . 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 DDD (degenerative disc disease), lumbar 11/17/2016  . Vitamin D deficiency 11/17/2016  . History of rosacea/ History of acne 11/17/2016  . High risk medications (not anticoagulants) long-term use 06/25/2016  . History of degenerative disc disease 06/25/2016  . Severe  obesity (BMI >= 40) (HCC) 06/13/2014  . Left-sided thoracic back pain 07/19/2013  . PALPITATIONS 10/17/2009  . UTI 05/24/2009  . Rheumatoid arthritis (HCC) 05/24/2009  . Hair loss 02/22/2009  . VAGINAL BLEEDING  10/05/2008  . Carrier of group B Streptococcus 07/28/2007  . GERD 12/28/2006  . CARDIAC MURMUR, HX OF 12/28/2006  . HIATAL HERNIA, HX OF 12/28/2006    Past Medical History:  Diagnosis Date  . Allergy   . Arthritis   . Carpal tunnel syndrome 10/2018  . DDD (degenerative disc disease), lumbar   . DDD (degenerative disc disease), thoracic   . Family history of adverse reaction to anesthesia    Mothers sister never woke up from surgery  . GERD (gastroesophageal reflux disease)   . Grade I diastolic dysfunction 2013  . Heart murmur   . Heart palpitations    History of  . History of hiatal hernia   . OA (osteoarthritis)   . Obese   . Rheumatoid arthritis (HCC)   . 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 Vitamin D deficiency     Family History  Problem Relation Age of Onset  . Uterine cancer Mother   . Diabetes Father   . Kidney cancer Father   . Colon cancer Sister 40  . Rectal cancer Sister   . Colon cancer Paternal Grandfather   . Colon polyps Brother   . Cancer Sister        breast   . Healthy Daughter   . Esophageal cancer Neg Hx   . Stomach cancer Neg Hx    Past Surgical History:  Procedure Laterality Date  . COLONOSCOPY  2014   DB- normal   . KNEE ARTHROPLASTY    . 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   Procedure: LEFT TOTAL KNEE ARTHROPLASTY;  Surgeon: Kathryne Hitch, MD;  Location: [facility];  Service: Orthopedics;  Laterality: Left;  . UPPER GASTROINTESTINAL ENDOSCOPY     Social History   Social History Narrative   Exercise--- sometimes walks 1x a week   Immunization History  Administered Date(s) Administered  . Influenza Whole 02/08/2009  . Influenza,inj,Quad PF,6+ Mos 03/09/2013, 06/13/2014, 06/16/2016  . PFIZER SARS-COV-2 Vaccination 08/26/2019  . Td 09/29/2001  . Tdap 06/13/2014     Objective: Vital Signs: BP 119/81 (BP Location: Left Arm, Patient Position: Sitting, Cuff Size: Normal)   Pulse 86   Resp 15   Ht 5' 2.5" (1.588 m)   Wt 219 lb 9.6 oz  (99.6 kg)   LMP 08/18/2018   BMI 39.53 kg/m    Physical Exam Vitals and nursing note reviewed.  Constitutional:      Appearance: She is well-developed.  HENT:     Head: Normocephalic and atraumatic.  Eyes:     Conjunctiva/sclera: Conjunctivae normal.  Pulmonary:     Effort: Pulmonary effort is normal.  Abdominal:     General: Bowel sounds are normal.     Palpations: Abdomen is soft.  Musculoskeletal:     Cervical back: Normal range of motion.  Lymphadenopathy:     Cervical: No cervical adenopathy.  Skin:    General: Skin is warm and dry.     Capillary Refill: Capillary refill takes less than 2 seconds.  Neurological:     Mental Status: She is alert and oriented to person, place, and time.  Psychiatric:        Behavior: Behavior normal.      Musculoskeletal Exam: C-spine limited range of motion with lateral rotation.  Thoracic kyphosis noted.  Shoulder joints, elbow joints have good range of motion with no discomfort or inflammation.  She has limited range of motion of both wrist joints.  Tenderness, synovial thickening, and synovitis over the ulnar aspect of both wrist joints was noted.  She has tenderness and synovitis of the right second and third MCPs and third PIP joint.  She has tenderness and synovitis of the left second and fifth MCP joints on exam.  Hip joints have good range of motion with no discomfort.  Left knee joint has good range of motion with no warmth or effusion.  Right knee has good range of motion with no warmth or effusion.  Warmth of the right ankle joint noted on exam today.  Left ankle has no warmth or swelling.  CDAI Exam: CDAI Score: 13  Patient Global: 5 mm; Provider Global: 5 mm Swollen: 7 ; Tender: 6  Joint Exam 09/22/2019      Right  Left  Wrist  Swollen Tender  Swollen Tender  MCP 2  Swollen Tender  Swollen Tender  MCP 3  Swollen Tender     PIP 3  Swollen Tender     Ankle  Swollen         Investigation: No additional findings.   Imaging: No results found.  Recent Labs: Lab Results  Component Value Date   WBC 8.5 09/14/2019   HGB 12.4 09/14/2019   PLT 318 09/14/2019   NA 140 09/14/2019   K 4.3 09/14/2019   CL 103 09/14/2019   CO2 27 09/14/2019   GLUCOSE 92 09/14/2019   BUN 20 09/14/2019   CREATININE 0.60 09/14/2019   BILITOT 0.3 09/14/2019   ALKPHOS 80 03/17/2019   AST 13 09/14/2019   ALT 10 09/14/2019   PROT 6.8 09/14/2019   ALBUMIN 3.8 03/17/2019   CALCIUM 8.9 09/14/2019   GFRAA 120 09/14/2019   QFTBGOLDPLUS NEGATIVE 10/28/2018    Speciality Comments: No specialty comments available.  Procedures:  No procedures performed Allergies: Penicillins   Assessment / Plan:     Visit Diagnoses: Rheumatoid arthritis involving multiple sites with positive rheumatoid factor (HCC) -  Positive rheumatoid factor, positive anti-CCP, erosive disease: She has tenderness and synovitis of both wrist joints, right second and third MCP, third PIP, and left second and fifth MCP joints.  Warmth of the right ankle joint was noted on exam today.  She was started on Orencia 125 mg subcutaneous injections once weekly on 07/19/2019.  She has been on Rasuvo 20 mg subcutaneous injections once weekly and folic acid 2 mg by mouth daily.  At her last office visit on 08/23/2019 we increased the dose of Rasuvo to 22.5 mg sq injections once weekly which she injected on 2 consecutive weeks.  CBC and CMP were drawn on 09/14/2019 which were within normal limits.  She ran out of samples and resumed Rasuvo 20 mg of days injections weekly.  During that time she took a prednisone taper and states that with combination therapy her joint pain and inflammation improved significantly.  She has been off of prednisone for about 2 weeks and continues to notice clinical improvement on combination.  Overall she has noticed a 50 to 60% improvement since starting on Orencia.  We discussed getting Orencia more time and in the meantime increasing the dose of Oxtreup  to 25 mg subcutaneous injections once weekly.  She was given 2 samples of Oxtrep today in the office.  She will return for lab work in 2 weeks and if labs  are stable she will continue on Orencia 125 mg sq injections once weekly, Otrexup 25 mg subcutaneous injections once weekly and folic acid 2 mg by mouth daily.  She was advised to notify us if she continues to have persistent joint pain and inflammation.  She will follow-up in the office in 2 months.  High risk medications (not anticoagulants) long-term use - Orencia 125 mg sq injections once weekly, methotrexate 25 mg sq injections, and folic acid 2 mg po daily.  CBC and CMP are within normal limits on 09/14/2019.  She will return for lab work in 2 weeks after increasing the dose of Otrexup.  TB gold was negative on 10/28/2018.  A future order for TB gold is in place.   Primary osteoarthritis of right knee: She experiences intermittent pain in the right knee joint.  She is good range of motion on exam today.  No warmth or effusion was noted.  S/P total knee arthroplasty, left: Doing well.  She has good range of motion with no discomfort at this time.  DDD (degenerative disc disease), thoracic: Thoracic kyphosis noted.  No midline spinal tenderness.  DDD (degenerative disc disease), lumbar: Her lower back pain has been improving recently.  No symptoms of radiculopathy.  History of vitamin D deficiency: She is taking vitamin D 2,000 units daily.    Other medical conditions are listed as follows:   History of gastroesophageal reflux (GERD)  History of rosacea/ History of acne  History of hiatal hernia  Orders: No orders of the defined types were placed in this encounter.  No orders of the defined types were placed in this encounter.     Follow-Up Instructions: Return in about 2 months (around 11/22/2019) for Rheumatoid arthritis, Osteoarthritis.   Gearldine Bienenstock, PA-C  Note - This record has been created using Dragon software.  Chart  creation errors have been sought, but may not always  have been located. Such creation errors do not reflect on  the standard of medical care.

## 2019-09-21 ENCOUNTER — Ambulatory Visit: Payer: 59

## 2019-09-22 ENCOUNTER — Encounter: Payer: Self-pay | Admitting: Physician Assistant

## 2019-09-22 ENCOUNTER — Telehealth: Payer: Self-pay

## 2019-09-22 ENCOUNTER — Ambulatory Visit: Payer: 59 | Admitting: Physician Assistant

## 2019-09-22 ENCOUNTER — Other Ambulatory Visit: Payer: Self-pay

## 2019-09-22 VITALS — BP 119/81 | HR 86 | Resp 15 | Ht 62.5 in | Wt 219.6 lb

## 2019-09-22 DIAGNOSIS — M5134 Other intervertebral disc degeneration, thoracic region: Secondary | ICD-10-CM

## 2019-09-22 DIAGNOSIS — M1711 Unilateral primary osteoarthritis, right knee: Secondary | ICD-10-CM

## 2019-09-22 DIAGNOSIS — Z79899 Other long term (current) drug therapy: Secondary | ICD-10-CM | POA: Diagnosis not present

## 2019-09-22 DIAGNOSIS — Z96652 Presence of left artificial knee joint: Secondary | ICD-10-CM | POA: Diagnosis not present

## 2019-09-22 DIAGNOSIS — M0579 Rheumatoid arthritis with rheumatoid factor of multiple sites without organ or systems involvement: Secondary | ICD-10-CM | POA: Diagnosis not present

## 2019-09-22 DIAGNOSIS — Z872 Personal history of diseases of the skin and subcutaneous tissue: Secondary | ICD-10-CM

## 2019-09-22 DIAGNOSIS — M5136 Other intervertebral disc degeneration, lumbar region: Secondary | ICD-10-CM

## 2019-09-22 DIAGNOSIS — Z8719 Personal history of other diseases of the digestive system: Secondary | ICD-10-CM

## 2019-09-22 DIAGNOSIS — Z8639 Personal history of other endocrine, nutritional and metabolic disease: Secondary | ICD-10-CM

## 2019-09-22 NOTE — Patient Instructions (Signed)
Standing Labs We placed an order today for your standing lab work.    Please come back and get your standing labs in 2 weeks. (pick up samples when you come for labs)  We have open lab daily Monday through Thursday from 8:30-12:30 PM and 1:30-4:30 PM and Friday from 8:30-12:30 PM and 1:30-4:00 PM at the office of Dr. Pollyann Savoy.   You may experience shorter wait times on Monday and Friday afternoons. The office is located at [address], [address], [place], [place] [address] No appointment is necessary.   Labs are drawn by [facility].  You may receive a bill from [facility] for your lab work.  If you wish to have your labs drawn at another location, please call the office 24 hours in advance to send orders.  If you have any questions regarding directions or hours of operation,  please call [phone].   Just as a reminder please drink plenty of water prior to coming for your lab work. Thanks!

## 2019-09-22 NOTE — Progress Notes (Signed)
Medication Samples have been provided to the patient.  Drug name: Otrexup  Strength: 25mg      Qty: 2 LOT  Exp.Date: 04/2021  Dosing instructions: Inject 25mg  into the skin once weekly.   The patient has been instructed regarding the correct time, dose, and frequency of taking this medication, including desired effects and most common side effects.   Diondra Pines C Ronnetta Currington 10:57 AM 09/22/2019   Medication Samples have been provided to the patient.  Drug name: orencia  Strength: 125mg      Qty: 2 LOT Exp.Date: 06/2020  Dosing instructions: inject 125mg  into the skin once weekly.   The patient has been instructed regarding the correct time, dose, and frequency of taking this medication, including desired effects and most common side effects.   10:58 AM 09/22/2019

## 2019-09-22 NOTE — Telephone Encounter (Signed)
Patient's insurance has changed, BCBS card is scanned in. She is currently on [place] and rasuvo. Thanks!

## 2019-09-22 NOTE — Telephone Encounter (Signed)
Otrexup 25 mg subcutaneous injections once weekly.  She was given 2 samples of Oxtrep today in the office.  She will return for lab work in 2 weeks and if labs are stable she will continue.

## 2019-09-22 NOTE — Telephone Encounter (Signed)
New plan prefers Otrexup.  Received notification from [facility] regarding a prior authorization for OTREXUP. Authorization has been APPROVED from 09/22/19 to 09/20/22.   Authorization # [id number] Patient has commercial plan and can use a copay card.    Submitted a Prior Authorization request to [employer] for [employer] via Cover My Meds. Will update once we receive a response.  (Key: [id number])

## 2019-09-26 NOTE — Telephone Encounter (Signed)
Received a fax regarding Prior Authorization from [facility] for [facility]. Authorization has been DENIED. CMM did not show denial reason. Fax will be sent to the office.

## 2019-09-27 ENCOUNTER — Ambulatory Visit: Payer: BC Managed Care – PPO | Attending: Internal Medicine

## 2019-09-27 DIAGNOSIS — Z23 Encounter for immunization: Secondary | ICD-10-CM

## 2019-09-27 NOTE — Progress Notes (Signed)
   Covid-19 Vaccination Clinic  Name:  Donna Lopez    MRN: [id number] DOB: 1966/01/06  09/27/2019  [name] was observed post Covid-19 immunization for 15 minutes without incident. She was provided with Vaccine Information Sheet and instruction to access the V-Safe system.   [name] was instructed to call 911 with any severe reactions post vaccine: Marland Kitchen Difficulty breathing  . Swelling of face and throat  . A fast heartbeat  . A bad rash all over body  . Dizziness and weakness   Immunizations Administered    Name Date Dose VIS Date Route   Pfizer COVID-19 Vaccine 09/27/2019  2:40 PM 0.3 mL 07/13/2018 Intramuscular   Manufacturer: [employer], [employer]   Lot: [id number]   NDC: [address]-3

## 2019-09-27 NOTE — Telephone Encounter (Signed)
Received a fax regarding Prior Authorization from [facility] for [facility]. Authorization has been DENIED because no documentation of at least one of the following: Try methotrexate at highest dose tolerated for at least 3 months and did not work, try DMARD for at least 3 months and it did not work, had tried at least 1 seen in medication and cannot tolerate have a reason why they cannot try all of the above stated medications.  Also her plan prefers Enbrel, Humira, Renvela, and Xeljanz.  We will submit an appeal to insurance.  Phone#[phone] Fax #[phone]  Verlin Fester, PharmD, [place], CPP Clinical Specialty Pharmacist [phone]  09/27/2019 8:53 AM

## 2019-09-29 ENCOUNTER — Encounter: Payer: Self-pay | Admitting: Pharmacist

## 2019-09-29 NOTE — Telephone Encounter (Signed)
Appeal faxed to [facility].  Will update when we hear a response.  Fax # [phone] Ref# [facility]   Verlin Fester, PharmD, [place]y [place], CPP Clinical Specialty Pharmacist [phone]  09/29/2019 10:43 AM

## 2019-09-30 DIAGNOSIS — F4320 Adjustment disorder, unspecified: Secondary | ICD-10-CM | POA: Diagnosis not present

## 2019-10-11 NOTE — Telephone Encounter (Signed)
Received a fax regarding Prior Authorization from [facility] for [facility]. Authorization has been DENIED because patient has not tried to the following medications for at least 3 months and they did not work: Enbrel, Humira, Rinvoq, Barrister's clerk.  Patient does not have any contraindications or intolerance to Enbrel, Rinvoq, or Xeljanz.  Called to notify patient.  Suggested patient apply for Orencia on call as they have a program that would provide medication for up to a year while attempting insurance approval.  Patient would like to proceed with Orencia program.  Provider portion completed and patient portion placed at the front desk for patient to sign.  Patient states she is coming in for labs tomorrow.  Patient is out of medication and requests Orencia and injectable methotrexate pen 25 mg.  Advised that staff will be able to assist her when she is in the office tomorrow.   Verlin Fester, PharmD, [place], CPP Clinical Specialty Pharmacist (Rheumatology and Pulmonology)  10/11/2019 9:35 AM

## 2019-10-12 ENCOUNTER — Telehealth: Payer: Self-pay | Admitting: *Deleted

## 2019-10-12 ENCOUNTER — Other Ambulatory Visit: Payer: Self-pay

## 2019-10-12 DIAGNOSIS — Z111 Encounter for screening for respiratory tuberculosis: Secondary | ICD-10-CM | POA: Diagnosis not present

## 2019-10-12 DIAGNOSIS — Z79899 Other long term (current) drug therapy: Secondary | ICD-10-CM

## 2019-10-12 NOTE — Telephone Encounter (Signed)
Medication Samples have been provided to the patient.  Drug name: Orencia    Strength: 125 mg    Qty: 2  LOT: [id number]  Exp.Date: 02/2021  Dosing instructions: Inject 1 pen into skin once weekly.   The patient has been instructed regarding the correct time, dose, and frequency of taking this medication, including desired effects and most common side effects.   Dulcy Fanny 4:14 PM 10/12/2019   Medication Samples have been provided to the patient.  Drug name: Otrexup    Strength: 25 mg       Qty: 2 LOT: [id number]  Exp.Date: 04/2021  Dosing instructions: Inject one pen in to skin once weekly.   The patient has been instructed regarding the correct time, dose, and frequency of taking this medication, including desired effects and most common side effects.   Dulcy Fanny 4:15 PM 10/12/2019

## 2019-10-13 ENCOUNTER — Telehealth: Payer: Self-pay | Admitting: Rheumatology

## 2019-10-13 NOTE — Progress Notes (Signed)
CMP WNL.  WBC count is borderline elevated.  We will continue to monitor.

## 2019-10-13 NOTE — Telephone Encounter (Signed)
Faxed Orencia denial to program. Wrote on coversheet to ship medication to patient. Please refax Orencia form with delivery instructions.

## 2019-10-13 NOTE — Telephone Encounter (Signed)
Donna Lopez from [employer] ( [facility] ) calling in reference to Enrollment form. They received form, but Delivery instructions are missing. Also, they need PA status info. Please fax to # [phone].

## 2019-10-14 LAB — CBC WITH DIFFERENTIAL/PLATELET
Absolute Monocytes: 1134 cells/uL — ABNORMAL HIGH (ref 200–950)
Basophils Absolute: 44 cells/uL (ref 0–200)
Basophils Relative: 0.4 %
Eosinophils Absolute: 185 cells/uL (ref 15–500)
Eosinophils Relative: 1.7 %
HCT: 39 % (ref 35.0–45.0)
Hemoglobin: 12.9 g/dL (ref 11.7–15.5)
Lymphs Abs: 2387 cells/uL (ref 850–3900)
MCH: 28.9 pg (ref 27.0–33.0)
MCHC: 33.1 g/dL (ref 32.0–36.0)
MCV: 87.2 fL (ref 80.0–100.0)
MPV: 10 fL (ref 7.5–12.5)
Monocytes Relative: 10.4 %
Neutro Abs: 7150 cells/uL (ref 1500–7800)
Neutrophils Relative %: 65.6 %
Platelets: 323 10*3/uL (ref 140–400)
RBC: 4.47 10*6/uL (ref 3.80–5.10)
RDW: 15.9 % — ABNORMAL HIGH (ref 11.0–15.0)
Total Lymphocyte: 21.9 %
WBC: 10.9 10*3/uL — ABNORMAL HIGH (ref 3.8–10.8)

## 2019-10-14 LAB — COMPLETE METABOLIC PANEL WITH GFR
AG Ratio: 1.3 (calc) (ref 1.0–2.5)
ALT: 16 U/L (ref 6–29)
AST: 17 U/L (ref 10–35)
Albumin: 3.9 g/dL (ref 3.6–5.1)
Alkaline phosphatase (APISO): 94 U/L (ref 37–153)
BUN: 16 mg/dL (ref 7–25)
CO2: 25 mmol/L (ref 20–32)
Calcium: 8.8 mg/dL (ref 8.6–10.4)
Chloride: 103 mmol/L (ref 98–110)
Creat: 0.61 mg/dL (ref 0.50–1.05)
GFR, Est African American: 119 mL/min/{1.73_m2} (ref >=60)
GFR, Est Non African American: 103 mL/min/{1.73_m2} (ref >=60)
Globulin: 3 g/dL (calc) (ref 1.9–3.7)
Glucose, Bld: 82 mg/dL (ref 65–99)
Potassium: 4.1 mmol/L (ref 3.5–5.3)
Sodium: 137 mmol/L (ref 135–146)
Total Bilirubin: 0.3 mg/dL (ref 0.2–1.2)
Total Protein: 6.9 g/dL (ref 6.1–8.1)

## 2019-10-14 LAB — QUANTIFERON-TB GOLD PLUS
Mitogen-NIL: >10 IU/mL
NIL: 0.03 IU/mL
QuantiFERON-TB Gold Plus: NEGATIVE
TB1-NIL: <0 IU/mL
TB2-NIL: <0 IU/mL

## 2019-10-14 NOTE — Telephone Encounter (Signed)
Forms faxed to Orencia on call, will update once we receive a response.  Phone#  [phone] Fax# [phone]

## 2019-10-14 NOTE — Progress Notes (Signed)
TB gold negative

## 2019-10-18 ENCOUNTER — Telehealth: Payer: Self-pay | Admitting: Rheumatology

## 2019-10-18 NOTE — Telephone Encounter (Signed)
[place] from [employer] [facility] called stating the patient has been approved for the medication.  The delivery instructions must be completed on page 1 of the enrollment form.  We use this as the patient's prescription.  Please fax completed enrollment form with delivery instructions to [phone]   If you have any questions, please call back at #[phone].

## 2019-10-19 NOTE — Telephone Encounter (Signed)
Faxed updated form with delivery instructions to [facility].  Nothing further needed.   Verlin Fester, PharmD, [place], CPP Clinical Specialty Pharmacist (Rheumatology and Pulmonology)  10/19/2019 8:20 AM

## 2019-10-20 DIAGNOSIS — F4320 Adjustment disorder, unspecified: Secondary | ICD-10-CM | POA: Diagnosis not present

## 2019-11-10 DIAGNOSIS — F4320 Adjustment disorder, unspecified: Secondary | ICD-10-CM | POA: Diagnosis not present

## 2019-11-18 NOTE — Progress Notes (Signed)
Office Visit Note  Patient: Donna Lopez             Date of Birth: 1966-05-16           MRN: [id number]             PCP: Donato Schultz, DO Referring: Donato Schultz, * Visit Date: 11/30/2019 Occupation: @GUAROCC @  Subjective:  Discuss medication options   History of Present Illness: Donna Lopez is a 54 y.o. female with history of seropositive rheumatoid arthritis and osteoarthritis.  She is currently on Orencia 125 mg sq injections once weekly, Rasuvo 20 mg sq injections once weekly, and folic acid 2 mg po daily. She has noticed some improvement on combination therapy.  She is tolerating both medications.  She denies any recent infections.  She continues to have pain and inflammation in both hands and both wrist joints.  She has generalized joint stiffness.  Activities of Daily Living:  Patient reports morning stiffness for 15-20  minutes.   Patient Denies nocturnal pain.  Difficulty dressing/grooming: Denies Difficulty climbing stairs: Denies Difficulty getting out of chair: Denies Difficulty using hands for taps, buttons, cutlery, and/or writing: Reports  Review of Systems  Constitutional: Positive for fatigue.  HENT: Negative for mouth sores, mouth dryness and nose dryness.   Eyes: Negative for itching and dryness.  Respiratory: Negative for shortness of breath and difficulty breathing.   Cardiovascular: Negative for chest pain and palpitations.  Gastrointestinal: Negative for blood in stool, constipation and diarrhea.  Endocrine: Negative for increased urination.  Genitourinary: Negative for difficulty urinating and painful urination.  Musculoskeletal: Positive for arthralgias, joint pain, joint swelling and morning stiffness. Negative for myalgias, muscle tenderness and myalgias.  Skin: Negative for color change, rash and redness.  Allergic/Immunologic: Negative for susceptible to infections.  Neurological: Negative for dizziness, numbness, headaches,  memory loss and weakness.  Hematological: Negative for bruising/bleeding tendency.  Psychiatric/Behavioral: Negative for confusion.    PMFS History:  Patient Active Problem List   Diagnosis Date Noted  . Unilateral primary osteoarthritis, left knee 12/17/2018  . Status post total left knee replacement 12/17/2018  . Primary osteoarthritis of left knee 11/23/2018  . Carpal tunnel syndrome, left upper limb 11/23/2018  . BMI 40.0-44.9, adult (HCC) 07/12/2018  . Primary osteoarthritis of both knees 12/24/2016  . 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 DDD (degenerative disc disease), lumbar 11/17/2016  . Vitamin D deficiency 11/17/2016  . History of rosacea/ History of acne 11/17/2016  . High risk medications (not anticoagulants) long-term use 06/25/2016  . History of degenerative disc disease 06/25/2016  . Severe obesity (BMI >= 40) (HCC) 06/13/2014  . Left-sided thoracic back pain 07/19/2013  . PALPITATIONS 10/17/2009  . UTI 05/24/2009  . Rheumatoid arthritis (HCC) 05/24/2009  . Hair loss 02/22/2009  . VAGINAL BLEEDING 10/05/2008  . Carrier of group B Streptococcus 07/28/2007  . GERD 12/28/2006  . CARDIAC MURMUR, HX OF 12/28/2006  . HIATAL HERNIA, HX OF 12/28/2006    Past Medical History:  Diagnosis Date  . Allergy   . Arthritis   . Carpal tunnel syndrome 10/2018  . DDD (degenerative disc disease), lumbar   . DDD (degenerative disc disease), thoracic   . Family history of adverse reaction to anesthesia    Mothers sister never woke up from surgery  . GERD (gastroesophageal reflux disease)   . Grade I diastolic dysfunction 2013  . Heart murmur   . Heart palpitations    History of  .  History of hiatal hernia   . OA (osteoarthritis)   . Obese   . Rheumatoid arthritis (HCC)   . 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 Vitamin D deficiency     Family History  Problem Relation Age of Onset  . Uterine cancer Mother   . Diabetes Father   . Kidney cancer Father   . Colon  cancer Sister 45  . Rectal cancer Sister   . Colon cancer Paternal Grandfather   . Colon polyps Brother   . Cancer Sister        breast   . Healthy Daughter   . Esophageal cancer Neg Hx   . Stomach cancer Neg Hx    Past Surgical History:  Procedure Laterality Date  . COLONOSCOPY  2014   DB- normal   . KNEE ARTHROPLASTY    . 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   Procedure: LEFT TOTAL KNEE ARTHROPLASTY;  Surgeon: Kathryne Hitch, MD;  Location: [facility];  Service: Orthopedics;  Laterality: Left;  . UPPER GASTROINTESTINAL ENDOSCOPY     Social History   Social History Narrative   Exercise--- sometimes walks 1x a week   Immunization History  Administered Date(s) Administered  . Influenza Whole 02/08/2009  . Influenza,inj,Quad PF,6+ Mos 03/09/2013, 06/13/2014, 06/16/2016  . PFIZER SARS-COV-2 Vaccination 08/26/2019, 09/27/2019  . Td 09/29/2001  . Tdap 06/13/2014     Objective: Vital Signs: BP 133/84 (BP Location: Left Arm, Patient Position: Sitting, Cuff Size: Normal)   Pulse 78   Resp 17   Ht 5' 2.5" (1.588 m)   Wt 222 lb 3.2 oz (100.8 kg)   LMP 08/18/2018   BMI 39.99 kg/m    Physical Exam Vitals and nursing note reviewed.  Constitutional:      Appearance: She is well-developed.  HENT:     Head: Normocephalic and atraumatic.  Eyes:     Conjunctiva/sclera: Conjunctivae normal.  Pulmonary:     Effort: Pulmonary effort is normal.  Abdominal:     General: Bowel sounds are normal.     Palpations: Abdomen is soft.  Musculoskeletal:     Cervical back: Normal range of motion.  Lymphadenopathy:     Cervical: No cervical adenopathy.  Skin:    General: Skin is warm and dry.     Capillary Refill: Capillary refill takes less than 2 seconds.  Neurological:     Mental Status: She is alert and oriented to person, place, and time.  Psychiatric:        Behavior: Behavior normal.      Musculoskeletal Exam: C-spine has limited range of motion with lateral rotation  bilaterally.  Postural thoracic kyphosis.  Shoulder joints and elbow joints have good ROM with no discomfort.  She has limited range of motion of both wrist joints with synovial thickening bilaterally.  Synovial thickening of all MCP joints, most prominent in her second and third MCP joints.  Bogginess of the right third PIP and left fifth PIP joint noted.  Hip joints have good range of motion with no discomfort.  Left knee replacement has good ROM. She has warmth and tenderness of the left ankle joint.  No tenderness of MTP joints.  CDAI Exam: CDAI Score: -- Patient Global: --; Provider Global: -- Swollen: 5 ; Tender: 2  Joint Exam 11/30/2019      Right  Left  MCP 2  Swollen   Swollen   MCP 5     Swollen   PIP 3  Swollen Tender     Ankle  Swollen Tender     Investigation: No additional findings.  Imaging: XR Knee 1-2 Views Left  Result Date: 11/24/2019 Left knee 2 views: No acute fracture.  Total knee arthroplasty components all appear well-seated.  Knee is well located.  No bony abnormalities.   Recent Labs: Lab Results  Component Value Date   WBC 10.9 (H) 10/12/2019   HGB 12.9 10/12/2019   PLT 323 10/12/2019   NA 137 10/12/2019   K 4.1 10/12/2019   CL 103 10/12/2019   CO2 25 10/12/2019   GLUCOSE 82 10/12/2019   BUN 16 10/12/2019   CREATININE 0.61 10/12/2019   BILITOT 0.3 10/12/2019   ALKPHOS 80 03/17/2019   AST 17 10/12/2019   ALT 16 10/12/2019   PROT 6.9 10/12/2019   ALBUMIN 3.8 03/17/2019   CALCIUM 8.8 10/12/2019   GFRAA 119 10/12/2019   QFTBGOLDPLUS NEGATIVE 10/12/2019    Speciality Comments: No specialty comments available.  Procedures:  No procedures performed Allergies: Penicillins   Assessment / Plan:     Visit Diagnoses: Rheumatoid arthritis involving multiple sites with positive rheumatoid factor (HCC) - Positive rheumatoid factor, positive anti-CCP, erosive disease: She has tenderness, warmth, and inflammation in the left ankle joint.  She has  synovitis of several MCP joints and her right third PIP joint as described above.  She is currently on Orencia 125 mg sq  injections once weekly, Rasuvo 20 mg sq injections once weekly, and folic acid 2 mg po daily.  She has been on Orencia since 07/19/19.  She previously had an inadequate response to Humira and Simponi in the past.  She has had significant clinical improvement on this combination therapy.  We tried increasing the dose of Rasuvo but her insurance declined the increased dose.  She will continue on Rasuvo 20 mg sq injections once weekly. She will schedule an ultrasound guided left ankle joint cortisone injection.  She will follow up in 3 months.   High risk medications (not anticoagulants) long-term use - Orencia 125 mg sq injections once weekly, methotrexate 20 mg sq injections, and folic acid 2 mg po daily.  CBC and CMP were drawn on 10/12/2019.  TB gold was negative on 09/22/2019.  She will be due to update lab work in August and every 3 months to monitor for drug toxicity.  Standing orders for CBC and CMP are in place.  She is aware that she is to hold Orencia and methotrexate if she develops signs or symptoms of an infection and to resume once the infection has completely cleared. She previously had an inadequate response to Humira and Simponi.  Potential treatment options in the future include Enbrel, and both, or Actemra.  Primary osteoarthritis of right knee: She has good range of motion of the right knee joint on exam.  Her stiffness has started to improve.  S/P total knee arthroplasty, left: Doing well.  She has good ROM with no discomfort at this time.   DDD (degenerative disc disease), thoracic: Postural thoracic kyphosis noted. DEXA ordered today.  DDD (degenerative disc disease), lumbar: She has good ROM with no discomfort.    Postmenopausal - Order for DEXA placed today. Plan: DG BONE DENSITY (DXA)  Osteoporosis screening -Order for DEXA placed today. She has been on  prednisone frequently over the years due to uncontrolled rheumatoid arthritis. Plan: DG BONE DENSITY (DXA)  History of vitamin D deficiency: She is taking calcium and vitamin D supplement.   Other medical conditions are listed as follows:   History  of rosacea/ History of acne  History of gastroesophageal reflux (GERD)  History of hiatal hernia   Orders: Orders Placed This Encounter  Procedures  . DG BONE DENSITY (DXA)   No orders of the defined types were placed in this encounter.   Face-to-face time spent with patient was 30 minutes. Greater than 50% of time was spent in counseling and coordination of care.  Follow-Up Instructions: Return in about 3 months (around 03/01/2020) for Rheumatoid arthritis.   Gearldine Bienenstock, PA-C  Note - This record has been created using Dragon software.  Chart creation errors have been sought, but may not always  have been located. Such creation errors do not reflect on  the standard of medical care.

## 2019-11-24 ENCOUNTER — Ambulatory Visit: Payer: Self-pay

## 2019-11-24 ENCOUNTER — Other Ambulatory Visit: Payer: Self-pay

## 2019-11-24 ENCOUNTER — Ambulatory Visit: Payer: BC Managed Care – PPO | Admitting: Physician Assistant

## 2019-11-24 ENCOUNTER — Encounter: Payer: Self-pay | Admitting: Physician Assistant

## 2019-11-24 ENCOUNTER — Ambulatory Visit (INDEPENDENT_AMBULATORY_CARE_PROVIDER_SITE_OTHER): Payer: BC Managed Care – PPO | Admitting: Physician Assistant

## 2019-11-24 DIAGNOSIS — Z96652 Presence of left artificial knee joint: Secondary | ICD-10-CM | POA: Diagnosis not present

## 2019-11-24 NOTE — Progress Notes (Signed)
Office Visit Note   Patient: Donna Lopez           Date of Birth: 1965-08-30           MRN: [id number] Visit Date: 11/24/2019              Requested by: [address] [place], [place] [address] [place],  [place] [address] PCP: Zola Button, Grayling Congress, DO   Assessment & Plan: Visit Diagnoses:  1. Status post total left knee replacement     Plan: Patient will continue work on strengthening his knee.  Follow-up with [place] on as-needed basis.  Questions were encouraged and answered by Dr. Magnus Ivan and myself.  Follow-Up Instructions: Return if symptoms worsen or fail to improve.   Orders:  Orders Placed This Encounter  Procedures  . XR Knee 1-2 Views Left   No orders of the defined types were placed in this encounter.     Procedures: No procedures performed   Clinical Data: No additional findings.   Subjective: Chief Complaint  Patient presents with  . Left Knee - Follow-up    HPI Donna Lopez returns today almost 1 year status post left total knee arthroplasty.  She is doing well.  She does have some discomfort in the knee at times.  Especially whenever she has swelling in her hands due to her RA she notes that she has swelling in the left knee also.  She has some difficulty particularly going downstairs but again overall she states that she is pleased with the left total knee arthroplasty and is happy to have considerably less pain in the knee.  She denies any mechanical symptoms of the left knee.  Review of Systems  Constitutional: Negative for chills and fever.     Objective: Vital Signs: LMP 08/18/2018   Physical Exam Constitutional:      Appearance: She is not ill-appearing or diaphoretic.  Pulmonary:     Effort: Pulmonary effort is normal.  Neurological:     Mental Status: She is alert.  Psychiatric:        Mood and Affect: Mood normal.     Ortho Exam Left knee surgical incisions well-healed.  No signs acute oblique.  She has full  extension flexion to 110-115 degrees.  No instability with anterior drawer.  No instability valgus varus stressing.  Specialty Comments:  No specialty comments available.  Imaging: XR Knee 1-2 Views Left  Result Date: 11/24/2019 Left knee 2 views: No acute fracture.  Total knee arthroplasty components all appear well-seated.  Knee is well located.  No bony abnormalities.    PMFS History: Patient Active Problem List   Diagnosis Date Noted  . Unilateral primary osteoarthritis, left knee 12/17/2018  . Status post total left knee replacement 12/17/2018  . Primary osteoarthritis of left knee 11/23/2018  . Carpal tunnel syndrome, left upper limb 11/23/2018  . BMI 40.0-44.9, adult (HCC) 07/12/2018  . Primary osteoarthritis of both knees 12/24/2016  . 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 DDD (degenerative disc disease), lumbar 11/17/2016  . Vitamin D deficiency 11/17/2016  . History of rosacea/ History of acne 11/17/2016  . High risk medications (not anticoagulants) long-term use 06/25/2016  . History of degenerative disc disease 06/25/2016  . Severe obesity (BMI >= 40) (HCC) 06/13/2014  . Left-sided thoracic back pain 07/19/2013  . PALPITATIONS 10/17/2009  . UTI 05/24/2009  . Rheumatoid arthritis (HCC) 05/24/2009  . Hair loss 02/22/2009  . VAGINAL BLEEDING 10/05/2008  .  Carrier of group B Streptococcus 07/28/2007  . GERD 12/28/2006  . CARDIAC MURMUR, HX OF 12/28/2006  . HIATAL HERNIA, HX OF 12/28/2006   Past Medical History:  Diagnosis Date  . Allergy   . Arthritis   . Carpal tunnel syndrome 10/2018  . DDD (degenerative disc disease), lumbar   . DDD (degenerative disc disease), thoracic   . Family history of adverse reaction to anesthesia    Mothers sister never woke up from surgery  . GERD (gastroesophageal reflux disease)   . Grade I diastolic dysfunction 2013  . Heart murmur   . Heart palpitations    History of  . History of hiatal hernia   . OA  (osteoarthritis)   . Obese   . Rheumatoid arthritis (HCC)   . 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 Vitamin D deficiency     Family History  Problem Relation Age of Onset  . Uterine cancer Mother   . Diabetes Father   . Kidney cancer Father   . Colon cancer Sister 46  . Rectal cancer Sister   . Colon cancer Paternal Grandfather   . Colon polyps Brother   . Cancer Sister        breast   . Healthy Daughter   . Esophageal cancer Neg Hx   . Stomach cancer Neg Hx     Past Surgical History:  Procedure Laterality Date  . COLONOSCOPY  2014   DB- normal   . KNEE ARTHROPLASTY    . 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   Procedure: LEFT TOTAL KNEE ARTHROPLASTY;  Surgeon: Kathryne Hitch, MD;  Location: [facility];  Service: Orthopedics;  Laterality: Left;  . 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 Smoking status: Never Smoker  . Smokeless tobacco: Never Used  Vaping Use  . Vaping Use: Never used  Substance and Sexual Activity  . Alcohol use: Yes    Comment: rare alcohol use-once/twice a year  . Drug use: Never  . Sexual activity: Yes    Partners: Male

## 2019-11-30 ENCOUNTER — Encounter: Payer: Self-pay | Admitting: Physician Assistant

## 2019-11-30 ENCOUNTER — Other Ambulatory Visit: Payer: Self-pay

## 2019-11-30 ENCOUNTER — Ambulatory Visit: Payer: BC Managed Care – PPO | Admitting: Physician Assistant

## 2019-11-30 ENCOUNTER — Telehealth: Payer: Self-pay | Admitting: Pharmacist

## 2019-11-30 ENCOUNTER — Telehealth: Payer: Self-pay | Admitting: Rheumatology

## 2019-11-30 VITALS — BP 133/84 | HR 78 | Resp 17 | Ht 62.5 in | Wt 222.2 lb

## 2019-11-30 DIAGNOSIS — Z96652 Presence of left artificial knee joint: Secondary | ICD-10-CM

## 2019-11-30 DIAGNOSIS — Z8719 Personal history of other diseases of the digestive system: Secondary | ICD-10-CM

## 2019-11-30 DIAGNOSIS — Z79899 Other long term (current) drug therapy: Secondary | ICD-10-CM

## 2019-11-30 DIAGNOSIS — M0579 Rheumatoid arthritis with rheumatoid factor of multiple sites without organ or systems involvement: Secondary | ICD-10-CM | POA: Diagnosis not present

## 2019-11-30 DIAGNOSIS — Z13820 Encounter for screening for osteoporosis: Secondary | ICD-10-CM

## 2019-11-30 DIAGNOSIS — Z872 Personal history of diseases of the skin and subcutaneous tissue: Secondary | ICD-10-CM

## 2019-11-30 DIAGNOSIS — M5134 Other intervertebral disc degeneration, thoracic region: Secondary | ICD-10-CM

## 2019-11-30 DIAGNOSIS — M1711 Unilateral primary osteoarthritis, right knee: Secondary | ICD-10-CM

## 2019-11-30 DIAGNOSIS — Z8639 Personal history of other endocrine, nutritional and metabolic disease: Secondary | ICD-10-CM

## 2019-11-30 DIAGNOSIS — Z780 Asymptomatic menopausal state: Secondary | ICD-10-CM

## 2019-11-30 DIAGNOSIS — M5136 Other intervertebral disc degeneration, lumbar region: Secondary | ICD-10-CM

## 2019-11-30 NOTE — Telephone Encounter (Signed)
LMOM for patient to call and schedule left ankle injection.

## 2019-11-30 NOTE — Telephone Encounter (Addendum)
New plan prefers Otrexup. Already approved.  Received notification from [facility] regarding a prior authorization for OTREXUP. Authorization has been APPROVED from 09/22/19 to 09/20/22.   Authorization # [id number]  Patient has commercial plan and can use a copay card.

## 2019-11-30 NOTE — Telephone Encounter (Signed)
Please start benefits investigation for Rasuvo for treatment of rheumatoid arthritis.  Patient tried oral methotrexate with inadequate response.   Verlin Fester, PharmD, [place], CPP Clinical Specialty Pharmacist (Rheumatology and Pulmonology)  11/30/2019 11:32 AM

## 2019-11-30 NOTE — Patient Instructions (Addendum)
Shingrix Vaccine     Standing Labs We placed an order today for your standing lab work.   Please have your standing labs drawn in August and every 3 months   If possible, please have your labs drawn 2 weeks prior to your appointment so that the provider can discuss your results at your appointment.  We have open lab daily Monday through Thursday from 8:30-12:30 PM and 1:30-4:30 PM and Friday from 8:30-12:30 PM and 1:30-4:00 PM at the office of Dr. Pollyann Savoy, [facility].   Please be advised, patients with office appointments requiring lab work will take precedents over walk-in lab work.  If possible, please come for your lab work on Monday and Friday afternoons, as you may experience shorter wait times. The office is located at [address], [place], [place] [address] No appointment is necessary.   Labs are drawn by [facility]. Please bring your co-pay at the time of your lab draw.  You may receive a bill from [facility] for your lab work.  If you wish to have your labs drawn at another location, please call the office 24 hours in advance to send orders.  If you have any questions regarding directions or hours of operation,  please call [phone].   As a reminder, please drink plenty of water prior to coming for your lab work. Thanks!

## 2019-11-30 NOTE — Progress Notes (Signed)
Pharmacy Note  Patient presents today to [facility] for follow up office visit. .   Patient seen by the pharmacist for medication review. Current medication regimen includes Rasuvo 20 mg, folic acid 2 mg daily, and Orencia Clickject 125 mg every 7 days for rheumatoid arthritis.  At last office visit discussed increasing injectable methotrexate to 25 mg.  She was given 2 samples but patient did not notice a difference in her symptoms.  She resumed the Rasuvo 20 mg and prefers to use her remaining pens as the medication is very expensive.    Medication Review:  Does the patient feel that his/her medications are effective in managing symptoms? No.  She does not feel like she is doing as well as she was when she was on Humira before she experienced wanning efficacy.  Has the patient been experiencing any side effects to the medications prescribed?   No   Has the patient missed any doses in the past few weeks? No   Does the patient have any problems obtaining medications from the pharmacy?   No   Does the patient have difficulty affording their medications?  No   She Reports joint pain and swelling in bilateral hands, knees and wrists.  Denies any recent flares.  Denies any recent infections.  She recently changed insurance companies and her new insurance denied Orencia.  She enrolled into the Orencia on-call program which provides free medication for up to 1 year.  She has not had any issues obtaining the medication from the program.  She has not filled Rasuvo with her new insurance and unsure if it will be covered.  We will start benefits investigation for Rasuvo and update when we receive a response.  CBB/CMP stable on 10/12/19. Last TB gold negative on 10/12/19.  Due for repeat CBC/CMP in August and TB gold in May 2022.  Standing orders in place.  All questions encouraged and answered.  Instructed patient to call with any questions or concerns.   Verlin Fester, PharmD, [place],  CPP Clinical Specialty Pharmacist (Rheumatology and Pulmonology)  11/30/2019 11:48 AM

## 2019-12-02 MED ORDER — OTREXUP 25 MG/0.4ML ~~LOC~~ SOAJ: 25.0000 mg | SUBCUTANEOUS | 0 refills | Status: DC

## 2019-12-05 NOTE — Telephone Encounter (Signed)
Called to notify patient of approval and that we send prescription to [facility] on Friday.  Patient states that she received a text from them Friday which directed her to the website.  Provided patient with pharmacy phone number to schedule her for shipment.  She has 1 Rasuvo 20 mg a pen left.  Instructed patient to use Rasuvo 20 mg pen and then transition to Otrexup 25 mg.  Directed patient to [email or web address] to sign up for co-pay card.  All questions encouraged and answered.  Instructed patient to reach out with any further questions or concerns.   Verlin Fester, PharmD, [place], CPP Clinical Specialty Pharmacist (Rheumatology and Pulmonology)  12/05/2019 11:15 AM

## 2019-12-22 DIAGNOSIS — F4320 Adjustment disorder, unspecified: Secondary | ICD-10-CM | POA: Diagnosis not present

## 2020-01-19 DIAGNOSIS — F4320 Adjustment disorder, unspecified: Secondary | ICD-10-CM | POA: Diagnosis not present

## 2020-02-02 ENCOUNTER — Encounter: Payer: Self-pay | Admitting: Rheumatology

## 2020-02-02 DIAGNOSIS — M85851 Other specified disorders of bone density and structure, right thigh: Secondary | ICD-10-CM | POA: Diagnosis not present

## 2020-02-02 DIAGNOSIS — Z780 Asymptomatic menopausal state: Secondary | ICD-10-CM | POA: Diagnosis not present

## 2020-02-02 DIAGNOSIS — M810 Age-related osteoporosis without current pathological fracture: Secondary | ICD-10-CM | POA: Diagnosis not present

## 2020-02-03 ENCOUNTER — Telehealth: Payer: Self-pay | Admitting: Radiology

## 2020-02-03 NOTE — Telephone Encounter (Signed)
Received bone density scan from [facility], completed on 02/02/2020.   T-score: -3.7 Left femoral neck BMD: 0.437 Regimen: calcium and vitamin D  Bone density scan reviewed by Sherron Ales, PA-C on 02/03/2020. Severe osteopetrosis noted.    Per Sherron Ales, PA-C appointment needed to discuss results. Spoke with patient and she would like to discuss at her appointment on 03/01/2020.   Copy sent to scan center.

## 2020-02-09 DIAGNOSIS — F4320 Adjustment disorder, unspecified: Secondary | ICD-10-CM | POA: Diagnosis not present

## 2020-02-16 NOTE — Progress Notes (Addendum)
Office Visit Note  Patient: Donna Lopez             Date of Birth: 02-04-1966           MRN: [id number]             PCP: Donato Schultz, DO Referring: Donato Schultz, * Visit Date: 03/01/2020 Occupation: @GUAROCC @  Subjective:  Discuss DEXA  History of Present Illness: Donna Lopez is a 54 y.o. female with history of seropositive rheumatoid arthritis and osteoarthritis.  She is clinically doing well on Orencia 125 mg sq injections once weekly, MTX 25 mg sq injections once weekly, and folic acid 2 mg po daily.  She has not missed any doses recently.  She denies any recent infections.  She denies any recent rheumatoid arthritis flares. She continues to experience intermittent pain and inflammation in both hands and the right knee joint. She has not needed to take prednisone recently.  She presents today to discuss DEXA results. She is taking a calcium and vitamin D supplement.  She denies any recent infections.  She has received both covid-19 vaccine doses.    Activities of Daily Living:  Patient reports morning stiffness for 10-15  minutes.   Patient Denies nocturnal pain.  Difficulty dressing/grooming: Denies Difficulty climbing stairs: Denies Difficulty getting out of chair: Denies Difficulty using hands for taps, buttons, cutlery, and/or writing: Reports  Review of Systems  Constitutional: Positive for fatigue.  HENT: Positive for nose dryness. Negative for mouth sores and mouth dryness.   Eyes: Negative for pain, itching and dryness.  Respiratory: Negative for shortness of breath and difficulty breathing.   Cardiovascular: Negative for chest pain and palpitations.  Gastrointestinal: Negative for blood in stool, constipation and diarrhea.  Endocrine: Negative for increased urination.  Genitourinary: Negative for difficulty urinating and painful urination.  Musculoskeletal: Positive for arthralgias, joint pain, joint swelling, muscle weakness and morning  stiffness. Negative for myalgias, muscle tenderness and myalgias.  Skin: Negative for color change, rash and redness.  Allergic/Immunologic: Negative for susceptible to infections.  Neurological: Negative for dizziness, numbness, headaches, memory loss and weakness.  Hematological: Negative for bruising/bleeding tendency.  Psychiatric/Behavioral: Negative for confusion and sleep disturbance.    PMFS History:  Patient Active Problem List   Diagnosis Date Noted   Unilateral primary osteoarthritis, left knee 12/17/2018   Status post total left knee replacement 12/17/2018   Primary osteoarthritis of left knee 11/23/2018   Carpal tunnel syndrome, left upper limb 11/23/2018   BMI 40.0-44.9, adult (HCC) 07/12/2018   Primary osteoarthritis of both knees 12/24/2016   DDD (degenerative disc disease), thoracic 11/17/2016   DDD (degenerative disc disease), lumbar 11/17/2016   Vitamin D deficiency 11/17/2016   History of rosacea/ History of acne 11/17/2016   High risk medications (not anticoagulants) long-term use 06/25/2016   History of degenerative disc disease 06/25/2016   Severe obesity (BMI >= 40) (HCC) 06/13/2014   Left-sided thoracic back pain 07/19/2013   PALPITATIONS 10/17/2009   UTI 05/24/2009   Rheumatoid arthritis (HCC) 05/24/2009   Hair loss 02/22/2009   VAGINAL BLEEDING 10/05/2008   Carrier of group B Streptococcus 07/28/2007   GERD 12/28/2006   CARDIAC MURMUR, HX OF 12/28/2006   HIATAL HERNIA, HX OF 12/28/2006    Past Medical History:  Diagnosis Date   Allergy    Arthritis    Carpal tunnel syndrome 10/2018   DDD (degenerative disc disease), lumbar    DDD (degenerative disc disease), thoracic  Family history of adverse reaction to anesthesia    Mothers sister never woke up from surgery   GERD (gastroesophageal reflux disease)    Grade I diastolic dysfunction 2013   Heart murmur    Heart palpitations    History of   History of hiatal hernia    OA (osteoarthritis)     Obese    Rheumatoid arthritis (HCC)    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    Vitamin D deficiency     Family History  Problem Relation Age of Onset   Uterine cancer Mother    Diabetes Father    Kidney cancer Father    Colon cancer Sister 51   Rectal cancer Sister    Colon cancer Paternal Grandfather    Colon polyps Brother    Cancer Sister        breast    Healthy Daughter    Esophageal cancer Neg Hx    Stomach cancer Neg Hx    Past Surgical History:  Procedure Laterality Date   COLONOSCOPY  2014   DB- normal    KNEE ARTHROPLASTY     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   Procedure: LEFT TOTAL KNEE ARTHROPLASTY;  Surgeon: Kathryne Hitch, MD;  Location: [facility];  Service: Orthopedics;  Laterality: Left;   UPPER GASTROINTESTINAL ENDOSCOPY     Social History   Social History Narrative   Exercise--- sometimes walks 1x a week   Immunization History  Administered Date(s) Administered   Influenza Whole 02/08/2009   Influenza,inj,Quad PF,6+ Mos 03/09/2013, 06/13/2014, 06/16/2016   PFIZER SARS-COV-2 Vaccination 08/26/2019, 09/27/2019   Td 09/29/2001   Tdap 06/13/2014     Objective: Vital Signs: BP 130/88 (BP Location: Left Arm, Patient Position: Sitting, Cuff Size: Normal)   Pulse 84   Resp 16   Ht 5\' 1"  (1.549 m)   Wt 228 lb (103.4 kg)   LMP 08/18/2018   BMI 43.08 kg/m    Physical Exam Vitals and nursing note reviewed.  Constitutional:      Appearance: She is well-developed.  HENT:     Head: Normocephalic and atraumatic.  Eyes:     Conjunctiva/sclera: Conjunctivae normal.  Pulmonary:     Effort: Pulmonary effort is normal.  Abdominal:     Palpations: Abdomen is soft.  Musculoskeletal:     Cervical back: Normal range of motion.  Skin:    General: Skin is warm and dry.     Capillary Refill: Capillary refill takes less than 2 seconds.  Neurological:     Mental Status: She is alert and oriented to person, place, and time.  Psychiatric:        Behavior:  Behavior normal.      Musculoskeletal Exam: C-spine thoracic spine, and lumbar spine good ROM.  Shoulder joints and elbow joints good ROM.  Limited ROM of both wrist joints.  Synovial thickening of all MCP joints.  Tenderness and synovitis of the right 3rd PIP joint.  Hip joints good ROM with no discomfort.  Left knee replacement good ROM with no discomfort. Warmth but no effusion of the left knee noted.  No warmth or effusion of right knee joint.  Tenderness and warmth of the right ankle joint.   CDAI Exam: CDAI Score: 3.6  Patient Global: 3 mm; Provider Global: 3 mm Swollen: 2 ; Tender: 3  Joint Exam 03/01/2020      Right  Left  MCP 2     Swollen Tender  PIP 3   Tender  Ankle  Swollen Tender        Investigation: No additional findings.  Imaging: No results found.  Recent Labs: Lab Results  Component Value Date   WBC 10.9 (H) 10/12/2019   HGB 12.9 10/12/2019   PLT 323 10/12/2019   NA 137 10/12/2019   K 4.1 10/12/2019   CL 103 10/12/2019   CO2 25 10/12/2019   GLUCOSE 82 10/12/2019   BUN 16 10/12/2019   CREATININE 0.61 10/12/2019   BILITOT 0.3 10/12/2019   ALKPHOS 80 03/17/2019   AST 17 10/12/2019   ALT 16 10/12/2019   PROT 6.9 10/12/2019   ALBUMIN 3.8 03/17/2019   CALCIUM 8.8 10/12/2019   GFRAA 119 10/12/2019   QFTBGOLDPLUS NEGATIVE 10/12/2019    Speciality Comments: No specialty comments available.  Procedures:  No procedures performed Allergies: Penicillins    Assessment / Plan:     Visit Diagnoses: Rheumatoid arthritis involving multiple sites with positive rheumatoid factor (HCC) - Positive rheumatoid factor, positive anti-CCP, erosive disease. She previously had an inadequate response to Humira and Simponi: She experiences intermittent pain and stiffness in both hands, right knee joint, and right ankle joint.  Synovial thickening of MCP joints noted on examination today.  Mild tenderness and synovitis of the left second MCP joint noted.  Warmth of the  right ankle joint was noted on exam.  Overall she is clinically doing well on Orencia 125 mg subcutaneous injections once weekly, methotrexate 25 mg sq injections once weekly, folic acid 2 mg by mouth daily.  She has not missed any doses of Orencia or methotrexate and has not had any side effects recently.  She has not had any recent infections.  She has had less frequent and severe flares on combination therapy.  She has not required a prednisone taper recently.  She will continue on the current treatment regimen.  She does not need any refills at this time.  She was advised to notify us if she develops increased joint pain or joint swelling.  She will follow-up in the office in 5 months.  High risk medications (not anticoagulants) long-term use - Orencia 125 mg sq injections once weekly, methotrexate 25 mg sq injections, and folic acid 2 mg po daily.  CBC and CMP were updated on 10/12/2019.  She is due to update lab work today.  Order for CBC and CMP were released.  Her next lab work will be due in January and every 3 months to monitor for drug toxicity.  Standing orders for CBC and CMP were placed today.  TB gold was negative on 10/12/2019 and will continue to be monitored yearly.- Plan: CBC with Differential/Platelet, COMPLETE METABOLIC PANEL WITH GFR She has received both COVID-19 vaccinations and was encouraged to receive the third dose.  She was advised to hold methotrexate and Orencia 1 week after receiving the third dose.  She was advised to avoid taking Tylenol and NSAIDs 24 hours prior to the third dose.  She was advised to notify us or her PCP if she develops a COVID-19 infection in order to receive the monoclonal antibody infusion.  She was encouraged to continue to wear a mask and social distance. She was also strongly encouraged to receive the annual influenza vaccine.  She voiced understanding. She is aware that she should hold methotrexate and Orencia if she develops signs or symptoms of an  infection and to resume once the infection has completely cleared.  Primary osteoarthritis of right knee: She experiences intermittent discomfort in her right  knee joint.  She has good range of motion on examination today with no warmth or effusion.  S/P total knee arthroplasty, left: Doing well.  She has good ROM with warmth but no effusion.   DDD (degenerative disc disease), thoracic: No midline spinal tenderness.   DDD (degenerative disc disease), lumbar: She is not having any lower back pain at this time.  She has occasional symptoms of sciatica.  No midline spinal tenderness or SI joint tenderness at this time.   Other osteoporosis without current pathological fracture - DEXA on 02/02/20: Left femur neck BMD 0.437 with T-score -3.7.  Repeat DEXA due in 2 years.  She is postmenopausal, has a family history of osteoporosis, and has taking many prednisone tapers over the years for RA flares.  No history of vertebral or fragility fractures.  No thoracic kyphosis or midline spinal tenderness noted.  No recent falls or fractures.  We discussed DEXA results today in detail and all questions were addressed.  We also discussed different treatment options available.  She is not a good candidate for oral bisphosphonates due to her history of gastroesophageal reflux.  We discussed the indications, contraindications, potential side effects of Prolia today.  All questions were addressed.  We will apply for Prolia 60 mg subcu injections every 6 months through her insurance.  We discussed the appropriate dosing of calcium and vitamin D.  We will check her vitamin D level today.  We also obtain the following lab work prior to starting her on Prolia.  Plan: CBC with Differential/Platelet, COMPLETE METABOLIC PANEL WITH GFR, VITAMIN D 25 Hydroxy (Vit-D Deficiency, Fractures), Parathyroid hormone, intact (no Ca), Phosphorus, TSH, Serum protein electrophoresis with reflex  History of vitamin D deficiency -She is taking a  calcium and vitamin D supplement daily.  We will check her vitamin D level today.  She will be starting on Prolia 60 mg sq injections once approved by her insurance.  Plan: VITAMIN D 25 Hydroxy (Vit-D Deficiency, Fractures)  History of gastroesophageal reflux (GERD): She is not a good candidate for an oral bisphosphonate.   Other medical conditions are listed as follows:   History of rosacea/ History of acne  History of hiatal hernia     Orders: Orders Placed This Encounter  Procedures   CBC with Differential/Platelet   COMPLETE METABOLIC PANEL WITH GFR   VITAMIN D 25 Hydroxy (Vit-D Deficiency, Fractures)   Parathyroid hormone, intact (no Ca)   Phosphorus   TSH   Serum protein electrophoresis with reflex   CBC with Differential/Platelet   COMPLETE METABOLIC PANEL WITH GFR   No orders of the defined types were placed in this encounter.    Follow-Up Instructions: Return in about 5 months (around 07/30/2020) for Rheumatoid arthritis, Osteoporosis, DDD.   Gearldine Bienenstock, PA-C  Note - This record has been created using Dragon software.  Chart creation errors have been sought, but may not always  have been located. Such creation errors do not reflect on  the standard of medical care.

## 2020-03-01 ENCOUNTER — Telehealth: Payer: Self-pay

## 2020-03-01 ENCOUNTER — Encounter: Payer: Self-pay | Admitting: Physician Assistant

## 2020-03-01 ENCOUNTER — Ambulatory Visit: Payer: BC Managed Care – PPO | Admitting: Physician Assistant

## 2020-03-01 ENCOUNTER — Other Ambulatory Visit: Payer: Self-pay

## 2020-03-01 VITALS — BP 130/88 | HR 84 | Resp 16 | Ht 61.0 in | Wt 228.0 lb

## 2020-03-01 DIAGNOSIS — M810 Age-related osteoporosis without current pathological fracture: Secondary | ICD-10-CM

## 2020-03-01 DIAGNOSIS — M0579 Rheumatoid arthritis with rheumatoid factor of multiple sites without organ or systems involvement: Secondary | ICD-10-CM

## 2020-03-01 DIAGNOSIS — M1711 Unilateral primary osteoarthritis, right knee: Secondary | ICD-10-CM

## 2020-03-01 DIAGNOSIS — Z8719 Personal history of other diseases of the digestive system: Secondary | ICD-10-CM

## 2020-03-01 DIAGNOSIS — M818 Other osteoporosis without current pathological fracture: Secondary | ICD-10-CM

## 2020-03-01 DIAGNOSIS — Z79899 Other long term (current) drug therapy: Secondary | ICD-10-CM

## 2020-03-01 DIAGNOSIS — M5136 Other intervertebral disc degeneration, lumbar region: Secondary | ICD-10-CM

## 2020-03-01 DIAGNOSIS — Z13820 Encounter for screening for osteoporosis: Secondary | ICD-10-CM

## 2020-03-01 DIAGNOSIS — Z96652 Presence of left artificial knee joint: Secondary | ICD-10-CM

## 2020-03-01 DIAGNOSIS — M5134 Other intervertebral disc degeneration, thoracic region: Secondary | ICD-10-CM

## 2020-03-01 DIAGNOSIS — Z8639 Personal history of other endocrine, nutritional and metabolic disease: Secondary | ICD-10-CM

## 2020-03-01 DIAGNOSIS — Z872 Personal history of diseases of the skin and subcutaneous tissue: Secondary | ICD-10-CM

## 2020-03-01 NOTE — Telephone Encounter (Signed)
Submitted a Prior Authorization request to [employer] for PROLIA via Cover My Meds. Will update once we receive a response.   (Key: [id number])

## 2020-03-01 NOTE — Telephone Encounter (Signed)
Please apply for prolia injections, per Sherron Ales, PA-C. Thanks!

## 2020-03-02 DIAGNOSIS — F4320 Adjustment disorder, unspecified: Secondary | ICD-10-CM | POA: Diagnosis not present

## 2020-03-02 NOTE — Progress Notes (Signed)
PTH WNL

## 2020-03-02 NOTE — Progress Notes (Signed)
CBC and CMP WNL. Phosphorus WNL.  Vitamin D WNL. Please recommend a maintenance dose of vitamin D.  TSH WNL.

## 2020-03-02 NOTE — Telephone Encounter (Signed)
Received additional questions from insurance to complete.   Faxed to # [phone]

## 2020-03-04 ENCOUNTER — Other Ambulatory Visit: Payer: Self-pay | Admitting: Pharmacist

## 2020-03-04 DIAGNOSIS — M0579 Rheumatoid arthritis with rheumatoid factor of multiple sites without organ or systems involvement: Secondary | ICD-10-CM

## 2020-03-05 LAB — COMPLETE METABOLIC PANEL WITH GFR
AG Ratio: 1.1 (calc) (ref 1.0–2.5)
ALT: 15 U/L (ref 6–29)
AST: 18 U/L (ref 10–35)
Albumin: 3.9 g/dL (ref 3.6–5.1)
Alkaline phosphatase (APISO): 102 U/L (ref 37–153)
BUN: 15 mg/dL (ref 7–25)
CO2: 28 mmol/L (ref 20–32)
Calcium: 9.4 mg/dL (ref 8.6–10.4)
Chloride: 102 mmol/L (ref 98–110)
Creat: 0.53 mg/dL (ref 0.50–1.05)
GFR, Est African American: 124 mL/min/{1.73_m2} (ref >=60)
GFR, Est Non African American: 107 mL/min/{1.73_m2} (ref >=60)
Globulin: 3.4 g/dL (calc) (ref 1.9–3.7)
Glucose, Bld: 84 mg/dL (ref 65–99)
Potassium: 4.3 mmol/L (ref 3.5–5.3)
Sodium: 138 mmol/L (ref 135–146)
Total Bilirubin: 0.4 mg/dL (ref 0.2–1.2)
Total Protein: 7.3 g/dL (ref 6.1–8.1)

## 2020-03-05 LAB — PROTEIN ELECTROPHORESIS, SERUM, WITH REFLEX
Albumin ELP: 3.7 g/dL — ABNORMAL LOW (ref 3.8–4.8)
Alpha 1: 0.3 g/dL (ref 0.2–0.3)
Alpha 2: 1 g/dL — ABNORMAL HIGH (ref 0.5–0.9)
Beta 2: 0.7 g/dL — ABNORMAL HIGH (ref 0.2–0.5)
Beta Globulin: 0.6 g/dL (ref 0.4–0.6)
Gamma Globulin: 1.2 g/dL (ref 0.8–1.7)
Total Protein: 7.5 g/dL (ref 6.1–8.1)

## 2020-03-05 LAB — CBC WITH DIFFERENTIAL/PLATELET
Absolute Monocytes: 729 cells/uL (ref 200–950)
Basophils Absolute: 36 cells/uL (ref 0–200)
Basophils Relative: 0.4 %
Eosinophils Absolute: 180 cells/uL (ref 15–500)
Eosinophils Relative: 2 %
HCT: 40.6 % (ref 35.0–45.0)
Hemoglobin: 13.5 g/dL (ref 11.7–15.5)
Lymphs Abs: 2160 cells/uL (ref 850–3900)
MCH: 29.5 pg (ref 27.0–33.0)
MCHC: 33.3 g/dL (ref 32.0–36.0)
MCV: 88.8 fL (ref 80.0–100.0)
MPV: 10 fL (ref 7.5–12.5)
Monocytes Relative: 8.1 %
Neutro Abs: 5895 cells/uL (ref 1500–7800)
Neutrophils Relative %: 65.5 %
Platelets: 307 10*3/uL (ref 140–400)
RBC: 4.57 10*6/uL (ref 3.80–5.10)
RDW: 15.6 % — ABNORMAL HIGH (ref 11.0–15.0)
Total Lymphocyte: 24 %
WBC: 9 10*3/uL (ref 3.8–10.8)

## 2020-03-05 LAB — PARATHYROID HORMONE, INTACT (NO CA): PTH: 26 pg/mL (ref 14–64)

## 2020-03-05 LAB — VITAMIN D 25 HYDROXY (VIT D DEFICIENCY, FRACTURES): Vit D, 25-Hydroxy: 38 ng/mL (ref 30–100)

## 2020-03-05 LAB — PHOSPHORUS: Phosphorus: 4.3 mg/dL (ref 2.5–4.5)

## 2020-03-05 LAB — TSH: TSH: 2.01 mIU/L

## 2020-03-05 NOTE — Telephone Encounter (Signed)
Routing to clinical staff.

## 2020-03-05 NOTE — Progress Notes (Signed)
She can continue taking vitamin D 2,000 units daily.

## 2020-03-05 NOTE — Progress Notes (Signed)
SPEP no abnormal bands.

## 2020-03-05 NOTE — Telephone Encounter (Signed)
Per CMM, Prolia was denied. Will have to await fax to see reasoning why. Will follow up.

## 2020-03-05 NOTE — Telephone Encounter (Signed)
Last Visit: 03/01/2020 Next Visit: 08/03/2020 Labs: 03/01/2020 CBC and CMP WNL.  Current Dose per office note 03/01/2020: Orencia 125 mg sq injections once weekly DX: Rheumatoid arthritis involving multiple sites with positive rheumatoid factor   Okay to refill per Dr. Corliss Skains

## 2020-03-09 NOTE — Telephone Encounter (Signed)
Received notification regarding Prior Authorization from [facility] for PROLIA. Authorization has been DENIED because patient has not tried and failed a Bisphophonates.  Chart notes were sent with PA request to show patient's GERD not making her a good candidate for bisphosphonates.  Appeal will need to be submitted.  Requested denial letter to be sent to office, so we can appeal.

## 2020-03-13 NOTE — Telephone Encounter (Signed)
Fax Number for appeal [phone] Reference number : [id number]

## 2020-03-15 ENCOUNTER — Encounter: Payer: Self-pay | Admitting: *Deleted

## 2020-03-15 NOTE — Telephone Encounter (Signed)
Appeal letter written and faxed

## 2020-03-16 ENCOUNTER — Telehealth: Payer: Self-pay

## 2020-03-16 ENCOUNTER — Other Ambulatory Visit: Payer: Self-pay | Admitting: Rheumatology

## 2020-03-16 DIAGNOSIS — M0579 Rheumatoid arthritis with rheumatoid factor of multiple sites without organ or systems involvement: Secondary | ICD-10-CM

## 2020-03-16 MED ORDER — OTREXUP 25 MG/0.4ML ~~LOC~~ SOAJ: 25.0000 mg | SUBCUTANEOUS | 0 refills | Status: DC

## 2020-03-16 MED FILL — OTREXUP 25 MG/0.4 ML AUTO-I: 25 | 84 days supply | Qty: 5 | Fill #0

## 2020-03-16 NOTE — Telephone Encounter (Signed)
Per Rachael P., buyer at [facility], they said they are able to order it. Pt would just need to provide pharmacy with her copay card info.  Attempted to contact the patient and left message for patient to call the office.  Resent prescription to [facility].

## 2020-03-16 NOTE — Telephone Encounter (Signed)
Patient called stating [facility] is out of Otrexup and are not sure when they will be getting a shipment.  Patient states she is due to take her injection today and is out of medication.  Patient is requesting a return call ASAP.

## 2020-03-16 NOTE — Telephone Encounter (Signed)
Patient advised she may come by the office to pick up a sample. Patient advised I have reached out to Rachael to see if she can fill at a different pharmacy.

## 2020-03-16 NOTE — Addendum Note (Signed)
Addended by: Henriette Combs on: 03/16/2020 10:27 AM   Modules accepted: Orders

## 2020-03-16 NOTE — Telephone Encounter (Signed)
Medication Samples have been provided to the patient.  Drug name: Otrexup      Strength: 25 mg       Qty: 2 LOT: [id number]  Exp.Date: 04/2021  Dosing instructions: Inject 25 mg (one pen) into skin once weekly.   The patient has been instructed regarding the correct time, dose, and frequency of taking this medication, including desired effects and most common side effects.   Dulcy Fanny 4:26 PM 03/16/2020

## 2020-03-19 ENCOUNTER — Encounter: Payer: 59 | Admitting: Family Medicine

## 2020-03-21 DIAGNOSIS — F4320 Adjustment disorder, unspecified: Secondary | ICD-10-CM | POA: Diagnosis not present

## 2020-03-26 ENCOUNTER — Encounter: Payer: BC Managed Care – PPO | Admitting: Family Medicine

## 2020-03-30 ENCOUNTER — Encounter: Payer: Self-pay | Admitting: Family Medicine

## 2020-03-30 ENCOUNTER — Ambulatory Visit (INDEPENDENT_AMBULATORY_CARE_PROVIDER_SITE_OTHER): Payer: BC Managed Care – PPO | Admitting: Family Medicine

## 2020-03-30 ENCOUNTER — Other Ambulatory Visit: Payer: Self-pay

## 2020-03-30 VITALS — BP 136/88 | HR 94 | Temp 98.1°F | Resp 18 | Ht 61.0 in | Wt 232.2 lb

## 2020-03-30 DIAGNOSIS — M810 Age-related osteoporosis without current pathological fracture: Secondary | ICD-10-CM

## 2020-03-30 DIAGNOSIS — Z0000 Encounter for general adult medical examination without abnormal findings: Secondary | ICD-10-CM | POA: Diagnosis not present

## 2020-03-30 DIAGNOSIS — K219 Gastro-esophageal reflux disease without esophagitis: Secondary | ICD-10-CM

## 2020-03-30 DIAGNOSIS — Z23 Encounter for immunization: Secondary | ICD-10-CM | POA: Diagnosis not present

## 2020-03-30 LAB — CBC WITH DIFFERENTIAL/PLATELET
Basophils Absolute: 0 10*3/uL (ref 0.0–0.1)
Basophils Relative: 0.5 % (ref 0.0–3.0)
Eosinophils Absolute: 0.2 10*3/uL (ref 0.0–0.7)
Eosinophils Relative: 2.5 % (ref 0.0–5.0)
HCT: 39.3 % (ref 36.0–46.0)
Hemoglobin: 13 g/dL (ref 12.0–15.0)
Lymphocytes Relative: 23.1 % (ref 12.0–46.0)
Lymphs Abs: 1.8 10*3/uL (ref 0.7–4.0)
MCHC: 33.1 g/dL (ref 30.0–36.0)
MCV: 88.7 fl (ref 78.0–100.0)
Monocytes Absolute: 0.7 10*3/uL (ref 0.1–1.0)
Monocytes Relative: 9.4 % (ref 3.0–12.0)
Neutro Abs: 5.1 10*3/uL (ref 1.4–7.7)
Neutrophils Relative %: 64.5 % (ref 43.0–77.0)
Platelets: 284 10*3/uL (ref 150.0–400.0)
RBC: 4.42 Mil/uL (ref 3.87–5.11)
RDW: 17.1 % — ABNORMAL HIGH (ref 11.5–15.5)
WBC: 7.9 10*3/uL (ref 4.0–10.5)

## 2020-03-30 LAB — COMPREHENSIVE METABOLIC PANEL
ALT: 17 U/L (ref 0–35)
AST: 18 U/L (ref 0–37)
Albumin: 3.8 g/dL (ref 3.5–5.2)
Alkaline Phosphatase: 96 U/L (ref 39–117)
BUN: 16 mg/dL (ref 6–23)
CO2: 28 mEq/L (ref 19–32)
Calcium: 9 mg/dL (ref 8.4–10.5)
Chloride: 102 mEq/L (ref 96–112)
Creatinine, Ser: 0.53 mg/dL (ref 0.40–1.20)
GFR: 104.2 mL/min (ref >60.00)
Glucose, Bld: 81 mg/dL (ref 70–99)
Potassium: 4.3 mEq/L (ref 3.5–5.1)
Sodium: 139 mEq/L (ref 135–145)
Total Bilirubin: 0.3 mg/dL (ref 0.2–1.2)
Total Protein: 7.2 g/dL (ref 6.0–8.3)

## 2020-03-30 LAB — LIPID PANEL
Cholesterol: 191 mg/dL (ref 0–200)
HDL: 46.6 mg/dL (ref >39.00)
LDL Cholesterol: 123 mg/dL — ABNORMAL HIGH (ref 0–99)
NonHDL: 144.52
Total CHOL/HDL Ratio: 4
Triglycerides: 109 mg/dL (ref 0.0–149.0)
VLDL: 21.8 mg/dL (ref 0.0–40.0)

## 2020-03-30 LAB — VITAMIN D 25 HYDROXY (VIT D DEFICIENCY, FRACTURES): VITD: 31.88 ng/mL (ref 30.00–100.00)

## 2020-03-30 LAB — TSH: TSH: 2 u[IU]/mL (ref 0.35–4.50)

## 2020-03-30 MED ORDER — ESOMEPRAZOLE MAGNESIUM 40 MG PO CPDR: 40.0000 mg | DELAYED_RELEASE_CAPSULE | Freq: Every day | ORAL | 3 refills | Status: DC

## 2020-03-30 NOTE — Patient Instructions (Signed)

## 2020-03-30 NOTE — Progress Notes (Signed)
Subjective:     Donna Lopez is a 54 y.o. female and is here for a comprehensive physical exam. The patient reports no problems.  Social History   Socioeconomic History   Marital status: Married    Spouse name: Not on file   Number of children: Not on file   Years of education: Not on file   Highest education level: Not on file  Occupational History    Employer: [employer]    Comment: marketing  Tobacco Use   Smoking status: Never Smoker   Smokeless tobacco: Never Used  Building services engineer Use: Never used  Substance and Sexual Activity   Alcohol use: Yes    Comment: rare alcohol use-once/twice a year   Drug use: Never   Sexual activity: Yes    Partners: Male  Other Topics Concern   Not on file  Social History Narrative   Exercise--- sometimes walks 1x a week   Social Determinants of Health   Financial Resource Strain:    Difficulty of Paying Living Expenses: Not on file  Food Insecurity:    Worried About Programme researcher, broadcasting/film/video in the Last Year: Not on file   [employer] of Food in the Last Year: Not on file  Transportation Needs:    Lack of Transportation (Medical): Not on file   Lack of Transportation (Non-Medical): Not on file  Physical Activity:    Days of Exercise per Week: Not on file   Minutes of Exercise per Session: Not on file  Stress:    Feeling of Stress : Not on file  Social Connections:    Frequency of Communication with Friends and Family: Not on file   Frequency of Social Gatherings with Friends and Family: Not on file   Attends Religious Services: Not on file   Active Member of Clubs or Organizations: Not on file   Attends Banker Meetings: Not on file   Marital Status: Not on file  Intimate Partner Violence:    Fear of Current or Ex-Partner: Not on file   Emotionally Abused: Not on file   Physically Abused: Not on file   Sexually Abused: Not on file   Health Maintenance  Topic Date Due   MAMMOGRAM   12/15/2019   INFLUENZA VACCINE  12/18/2019   PAP SMEAR-Modifier  01/07/2021   COLONOSCOPY  12/03/2022   TETANUS/TDAP  06/13/2024   COVID-19 Vaccine  Completed   Hepatitis C Screening  Completed   HIV Screening  Completed    The following portions of the patient's history were reviewed and updated as appropriate:  She  has a past medical history of Allergy, Arthritis, Carpal tunnel syndrome (10/2018), DDD (degenerative disc disease), lumbar, DDD (degenerative disc disease), thoracic, Family history of adverse reaction to anesthesia, GERD (gastroesophageal reflux disease), Grade I diastolic dysfunction (2013), Heart murmur, Heart palpitations, History of hiatal hernia, OA (osteoarthritis), Obese, Rheumatoid arthritis (HCC), 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 She does not have any pertinent problems on file. She  has a past surgical history that includes Colonoscopy (2014); Upper gastrointestinal endoscopy; Total knee arthroplasty (Left, 12/17/2018); and Knee Arthroplasty. Her family history includes Cancer in her sister; Colon cancer in her paternal grandfather; Colon cancer (age of onset: 75) in her sister; Colon polyps in her brother; Diabetes in her father; Healthy in her daughter; Kidney cancer in her father; Rectal cancer in her sister; Uterine cancer in her mother. She  reports that she has never smoked. She has never  used smokeless tobacco. She reports current alcohol use. She reports that she does not use drugs. She has a current medication list which includes the following prescription(s): orencia clickject, acyclovir cream, aspirin, calcium + d3, vitamin d, folic acid, glucos-chondroit-hyaluron-msm, l-lysine, otrexup, pantoprazole, and valacyclovir. Current Outpatient Medications on File Prior to Visit  Medication Sig Dispense Refill   Abatacept (ORENCIA CLICKJECT) 125 MG/ML SOAJ Inject 125 mg into the skin once a week. 12 mL 0   acyclovir cream (ZOVIRAX) 5 % Apply 1  application topically every 3 (three) hours. (Patient taking differently: Apply 1 application topically as needed (fever blisters). ) 15 g 0   aspirin 81 MG chewable tablet Chew 1 tablet (81 mg total) by mouth 2 (two) times daily. (Patient taking differently: Chew 81 mg by mouth daily. ) 30 tablet 0   Calcium Carb-Cholecalciferol (CALCIUM + D3) 600-200 MG-UNIT TABS Take 1 capsule by mouth daily.     Cholecalciferol (VITAMIN D) 50 MCG (2000 UT) CAPS Take 2,000 Units by mouth daily.     folic acid (FOLVITE) 1 MG tablet Take 2 tablets (2 mg total) by mouth daily. 180 tablet 3   Glucos-Chondroit-Hyaluron-MSM (GLUCOSAMINE CHONDROITIN JOINT PO) Take 1 tablet by mouth daily.     L-Lysine 1000 MG TABS Take 1,000 mg by mouth daily.      Methotrexate, PF, (OTREXUP) 25 MG/0.4ML SOAJ Inject 25 mg into the skin every 7 (seven) days. 4.8 mL 0   pantoprazole (PROTONIX) 40 MG tablet Take 1 tablet (40 mg total) by mouth daily. 90 tablet 3   valACYclovir (VALTREX) 1000 MG tablet 2 po x1 at first sign of cold sore then repeat in 12 h (Patient taking differently: Take 2,000 mg by mouth 2 (two) times daily as needed (fever blisters). ) 30 tablet 2   No current facility-administered medications on file prior to visit.   She is allergic to penicillins..  Review of Systems Review of Systems  Constitutional: Negative for activity change, appetite change and fatigue.  HENT: Negative for hearing loss, congestion, tinnitus and ear discharge.  dentist q63m Eyes: Negative for visual disturbance (see optho q1y -- vision corrected to 20/20 with glasses).  Respiratory: Negative for cough, chest tightness and shortness of breath.   Cardiovascular: Negative for chest pain, palpitations and leg swelling.  Gastrointestinal: Negative for abdominal pain, diarrhea, constipation and abdominal distention.  Genitourinary: Negative for urgency, frequency, decreased urine volume and difficulty urinating.  Musculoskeletal:  Negative for back pain, arthralgias and gait problem.  Skin: Negative for color change, pallor and rash.  Neurological: Negative for dizziness, light-headedness, numbness and headaches.  Hematological: Negative for adenopathy. Does not bruise/bleed easily.  Psychiatric/Behavioral: Negative for suicidal ideas, confusion, sleep disturbance, self-injury, dysphoric mood, decreased concentration and agitation.       Objective:    BP 136/88 (BP Location: Right Arm, Patient Position: Sitting, Cuff Size: Large)    Pulse 94    Temp 98.1 F (36.7 C) (Oral)    Resp 18    Ht 5\' 1"  (1.549 m)    Wt 232 lb 3.2 oz (105.3 kg)    LMP 08/18/2018    SpO2 98%    BMI 43.87 kg/m  General appearance: alert, cooperative, appears stated age and no distress Head: Normocephalic, without obvious abnormality, atraumatic Eyes: negative findings: lids and lashes normal, conjunctivae and sclerae normal and pupils equal, round, reactive to light and accomodationgyn Ears: normal TM's and external ear canals both ears Neck: no adenopathy, no carotid bruit, no JVD,  supple, symmetrical, trachea midline and thyroid not enlarged, symmetric, no tenderness/mass/nodules Back: symmetric, no curvature. ROM normal. No CVA tenderness. Lungs: clear to auscultation bilaterally Breasts: gyn  Heart: regular rate and rhythm, S1, S2 normal, no murmur, click, rub or gallop Abdomen: soft, non-tender; bowel sounds normal; no masses,  no organomegaly Pelvic: gyn Extremities: extremities normal, atraumatic, no cyanosis or edema Pulses: 2+ and symmetric Skin: Skin color, texture, turgor normal. No rashes or lesions Lymph nodes: Cervical, supraclavicular, and axillary nodes normal. Neurologic: Alert and oriented X 3, normal strength and tone. Normal symmetric reflexes. Normal coordination and gait    Assessment:    Healthy female exam.      Plan:    ghm utd Check labs  See After Visit Summary for Counseling Recommendations    1. Need  for influenza vaccination  - Flu Vaccine QUAD 36+ mos IM  2. Preventative health care See above  - TSH - Lipid panel - CBC with Differential/Platelet - Comprehensive metabolic panel - Vitamin D (25 hydroxy)  3. Gastroesophageal reflux disease, unspecified whether esophagitis present Switch back to nexium  If symptoms con't f/u GI - esomeprazole (NEXIUM) 40 MG capsule; Take 1 capsule (40 mg total) by mouth daily.  Dispense: 90 capsule; Refill: 3 - Ambulatory referral to Gastroenterology  4. Osteoporosis without current pathological fracture, unspecified osteoporosis type   - Vitamin D (25 hydroxy)

## 2020-04-04 NOTE — Telephone Encounter (Signed)
Patient is in agreement to do the Reclast IV.

## 2020-04-04 NOTE — Telephone Encounter (Signed)
Received a fax regarding Appeal from [facility] for PROLIA. Authorization has been DENIED because patient has not tried/ failed a bisphosphonate.  Phone# [phone]

## 2020-04-04 NOTE — Telephone Encounter (Signed)
Please advise patient that Prolia has been declined by the insurance.  She will have to try Reclast IV due to history of gastroesophageal reflux.  If patient is not comfortable starting on the medication she will have to schedule an appointment to discuss the medication.  Ladona Ridgel went into detail about different medication options at the last visit including their side effects.

## 2020-04-05 NOTE — Telephone Encounter (Signed)
Received response form [employer] that prior authorization is not required for Reclast- [id number].  Called plan for benefits:  Findings of benefits investigation for Reclast- [id number], [phone] :   Insurance: [employer]   Phone:  [phone]  Plan is ACTIVE  Per Insurance, Reclast- [id number]- does NOT require precertification in the outpatient setting as long as the location is considered in-network. [place] is in-network with this plan.  Plan pays 70% and patient is responsible for 30%  Ref# [id number]

## 2020-04-05 NOTE — Telephone Encounter (Signed)
Faxed Reclast Pre-cert request to [employer]. Will update once we receive response.

## 2020-04-06 ENCOUNTER — Other Ambulatory Visit: Payer: Self-pay | Admitting: Physician Assistant

## 2020-04-06 DIAGNOSIS — M810 Age-related osteoporosis without current pathological fracture: Secondary | ICD-10-CM

## 2020-04-06 NOTE — Telephone Encounter (Signed)
Placed order and reviewed with Dr. Deveshwar prior to signing.

## 2020-04-06 NOTE — Progress Notes (Signed)
Next infusion scheduled for Reclast and due for updated orders.  Last Visit: 03/01/20 Next Visit:  08/03/20 Labs: Lab work was updated on 03/30/2020.  Creatinine was 0.53 with GFR 104.2.  Calcium was 9.0 and vitamin D 31.88. DEXA 02/02/2020 left femoral neck BMD 0.437 with T score -3.7.  She is postmenopausal, has a family history of osteoporosis, and has taken several prednisone tapers over the years.  She is not a good candidate for oral bisphosphonates due to history of gastroesophageal reflux.  Insurance declined approval for [employer].  We will proceed with IV Reclast infusions once yearly.  She will be due to update DEXA in September 2023. She will continue taking calcium and vitamin D supplement as recommended.  Orders placed for Reclast x 1dose along with premedication of Tylenol and Benadryl.  Standing CBC/CMP orders placed.  Placed orders for Reclast and were reviewed with Dr. Corliss Skains prior to signing  Sherron Ales, PA-C

## 2020-04-06 NOTE — Telephone Encounter (Signed)
Spoke with patient and advised she is able to have Reclast through her insurance as a PA is not required. Patient provided with number to [facility] and she will call to schedule.

## 2020-04-19 DIAGNOSIS — F4320 Adjustment disorder, unspecified: Secondary | ICD-10-CM | POA: Diagnosis not present

## 2020-04-20 ENCOUNTER — Encounter: Payer: Self-pay | Admitting: Rheumatology

## 2020-05-24 DIAGNOSIS — F4320 Adjustment disorder, unspecified: Secondary | ICD-10-CM | POA: Diagnosis not present

## 2020-05-26 ENCOUNTER — Other Ambulatory Visit: Payer: Self-pay

## 2020-05-26 ENCOUNTER — Other Ambulatory Visit: Payer: BC Managed Care – PPO

## 2020-05-26 DIAGNOSIS — Z20822 Contact with and (suspected) exposure to covid-19: Secondary | ICD-10-CM

## 2020-05-27 ENCOUNTER — Other Ambulatory Visit: Payer: BC Managed Care – PPO

## 2020-05-29 LAB — NOVEL CORONAVIRUS, NAA: SARS-CoV-2, NAA: NOT DETECTED

## 2020-06-01 ENCOUNTER — Telehealth: Payer: Self-pay | Admitting: Pharmacist

## 2020-06-01 NOTE — Telephone Encounter (Signed)
Received notification from [employer] in regards to expired PA - patient is currently receiving in On Call program for 2 years awaiting approval from insurance  Submitted a Prior Authorization request to [employer] P[employer] for [employer] via Cover My Meds. Will update once we receive a response.  Key: [id number]  Chesley Mires, PharmD, MPH Clinical Pharmacist (Rheumatology and Pulmonology)

## 2020-06-06 NOTE — Telephone Encounter (Signed)
Insurance sent additional questions. Faxed back to plan. Awaiting response.

## 2020-06-11 ENCOUNTER — Ambulatory Visit: Payer: BC Managed Care – PPO | Admitting: Gastroenterology

## 2020-06-12 DIAGNOSIS — F4320 Adjustment disorder, unspecified: Secondary | ICD-10-CM | POA: Diagnosis not present

## 2020-06-13 NOTE — Telephone Encounter (Signed)
Refaxed clinical questions. Should receive response soon.

## 2020-06-15 ENCOUNTER — Other Ambulatory Visit: Payer: Self-pay | Admitting: Physician Assistant

## 2020-06-15 NOTE — Telephone Encounter (Signed)
Last Visit: 03/01/2020 Next Visit: 08/03/2020  Current Dose per office note on 03/01/2020, folic acid 2 mg po daily Dx: Rheumatoid arthritis involving multiple sites with positive rheumatoid factor   Okay to refill Folic Acid?

## 2020-06-15 NOTE — Telephone Encounter (Signed)
Received a fax regarding Prior Authorization from [facility] for [facility]. Authorization has been DENIED because because patient has not tried to the following medications for at least 3 months and they did not work: Enbrel, Humira, Rinvoq, Harriette Ohara  Will fax denial to [employer].  Phone#[phone]

## 2020-06-19 ENCOUNTER — Ambulatory Visit: Payer: BC Managed Care – PPO | Admitting: Gastroenterology

## 2020-06-19 ENCOUNTER — Encounter: Payer: Self-pay | Admitting: Gastroenterology

## 2020-06-19 VITALS — BP 124/70 | HR 98 | Ht 62.0 in | Wt 234.0 lb

## 2020-06-19 DIAGNOSIS — K219 Gastro-esophageal reflux disease without esophagitis: Secondary | ICD-10-CM

## 2020-06-19 DIAGNOSIS — K449 Diaphragmatic hernia without obstruction or gangrene: Secondary | ICD-10-CM

## 2020-06-19 DIAGNOSIS — R1310 Dysphagia, unspecified: Secondary | ICD-10-CM | POA: Diagnosis not present

## 2020-06-19 NOTE — Patient Instructions (Signed)
You have been scheduled for an endoscopy. Please follow written instructions given to you at your visit today. If you use inhalers (even only as needed), please bring them with you on the day of your procedure.   If you are age 56 or older, your body mass index should be between 23-30. Your Body mass index is 42.8 kg/m. If this is out of the aforementioned range listed, please consider follow up with your Primary Care Provider.  If you are age 41 or younger, your body mass index should be between 19-25. Your Body mass index is 42.8 kg/m. If this is out of the aformentioned range listed, please consider follow up with your Primary Care Provider.    Due to recent changes in healthcare laws, you may see the results of your imaging and laboratory studies on MyChart before your provider has had a chance to review them.  We understand that in some cases there may be results that are confusing or concerning to you. Not all laboratory results come back in the same time frame and the provider may be waiting for multiple results in order to interpret others.  Please give [place] 48 hours in order for your provider to thoroughly review all the results before contacting the office for clarification of your results.   Continue Nexium    Conn's Current Therapy 2021 (pp. 213-216). [place], [place]: Elsevier.">  Gastroesophageal Reflux Disease, Adult Gastroesophageal reflux (GER) happens when acid from the stomach flows up into the tube that connects the mouth and the stomach (esophagus). Normally, food travels down the esophagus and stays in the stomach to be digested. However, when a person has GER, food and stomach acid sometimes move back up into the esophagus. If this becomes a more serious problem, the person may be diagnosed with a disease called gastroesophageal reflux disease (GERD). GERD occurs when the reflux:  Happens often.  Causes frequent or severe symptoms.  Causes problems such as damage to the  esophagus. When stomach acid comes in contact with the esophagus, the acid may cause inflammation in the esophagus. Over time, GERD may create small holes (ulcers) in the lining of the esophagus. What are the causes? This condition is caused by a problem with the muscle between the esophagus and the stomach (lower esophageal sphincter, or LES). Normally, the LES muscle closes after food passes through the esophagus to the stomach. When the LES is weakened or abnormal, it does not close properly, and that allows food and stomach acid to go back up into the esophagus. The LES can be weakened by certain dietary substances, medicines, and medical conditions, including:  Tobacco use.  Pregnancy.  Having a hiatal hernia.  Alcohol use.  Certain foods and beverages, such as coffee, chocolate, onions, and peppermint. What increases the risk? You are more likely to develop this condition if you:  Have an increased body weight.  Have a connective tissue disorder.  Take NSAIDs, such as ibuprofen. What are the signs or symptoms? Symptoms of this condition include:  Heartburn.  Difficult or painful swallowing and the feeling of having a lump in the throat.  A bitter taste in the mouth.  Bad breath and having a large amount of saliva.  Having an upset or bloated stomach and belching.  Chest pain. Different conditions can cause chest pain. Make sure you see your health care provider if you experience chest pain.  Shortness of breath or wheezing.  Ongoing (chronic) cough or a nighttime cough.  Wearing away  of tooth enamel.  Weight loss. How is this diagnosed? This condition may be diagnosed based on a medical history and a physical exam. To determine if you have mild or severe GERD, your health care provider may also monitor how you respond to treatment. You may also have tests, including:  A test to examine your stomach and esophagus with a small camera (endoscopy).  A test that  measures the acidity level in your esophagus.  A test that measures how much pressure is on your esophagus.  A barium swallow or modified barium swallow test to show the shape, size, and functioning of your esophagus. How is this treated? Treatment for this condition may vary depending on how severe your symptoms are. Your health care provider may recommend:  Changes to your diet.  Medicine.  Surgery. The goal of treatment is to help relieve your symptoms and to prevent complications. Follow these instructions at home: Eating and drinking  Follow a diet as recommended by your health care provider. This may involve avoiding foods and drinks such as: ? Coffee and tea, with or without caffeine. ? Drinks that contain alcohol. ? Energy drinks and sports drinks. ? Carbonated drinks or sodas. ? Chocolate and cocoa. ? Peppermint and mint flavorings. ? Garlic and onions. ? Horseradish. ? Spicy and acidic foods, including peppers, chili powder, curry powder, vinegar, hot sauces, and barbecue sauce. ? Citrus fruit juices and citrus fruits, such as oranges, lemons, and limes. ? Tomato-based foods, such as red sauce, chili, salsa, and pizza with red sauce. ? Fried and fatty foods, such as donuts, french fries, potato chips, and high-fat dressings. ? High-fat meats, such as hot dogs and fatty cuts of red and white meats, such as rib eye steak, sausage, ham, and bacon. ? High-fat dairy items, such as whole milk, butter, and cream cheese.  Eat small, frequent meals instead of large meals.  Avoid drinking large amounts of liquid with your meals.  Avoid eating meals during the 2-3 hours before bedtime.  Avoid lying down right after you eat.  Do not exercise right after you eat.   Lifestyle  Do not use any products that contain nicotine or tobacco. These products include cigarettes, chewing tobacco, and vaping devices, such as e-cigarettes. If you need help quitting, ask your health care  provider.  Try to reduce your stress by using methods such as yoga or meditation. If you need help reducing stress, ask your health care provider.  If you are overweight, reduce your weight to an amount that is healthy for you. Ask your health care provider for guidance about a safe weight loss goal.   General instructions  Pay attention to any changes in your symptoms.  Take over-the-counter and prescription medicines only as told by your health care provider. Do not take aspirin, ibuprofen, or other NSAIDs unless your health care provider told you to take these medicines.  Wear loose-fitting clothing. Do not wear anything tight around your waist that causes pressure on your abdomen.  Raise (elevate) the head of your bed about 6 inches (15 cm). You can use a wedge to do this.  Avoid bending over if this makes your symptoms worse.  Keep all follow-up visits. This is important. Contact a health care provider if:  You have: ? New symptoms. ? Unexplained weight loss. ? Difficulty swallowing or it hurts to swallow. ? Wheezing or a persistent cough. ? A hoarse voice.  Your symptoms do not improve with treatment. Get help right  away if:  You have sudden pain in your arms, neck, jaw, teeth, or back.  You suddenly feel sweaty, dizzy, or light-headed.  You have chest pain or shortness of breath.  You vomit and the vomit is green, yellow, or black, or it looks like blood or coffee grounds.  You faint.  You have stool that is red, bloody, or black.  You cannot swallow, drink, or eat. These symptoms may represent a serious problem that is an emergency. Do not wait to see if the symptoms will go away. Get medical help right away. Call your local emergency services (911 in the [place] Do not drive yourself to the hospital. Summary  Gastroesophageal reflux happens when acid from the stomach flows up into the esophagus. GERD is a disease in which the reflux happens often, causes frequent  or severe symptoms, or causes problems such as damage to the esophagus.  Treatment for this condition may vary depending on how severe your symptoms are. Your health care provider may recommend diet and lifestyle changes, medicine, or surgery.  Contact a health care provider if you have new or worsening symptoms.  Take over-the-counter and prescription medicines only as told by your health care provider. Do not take aspirin, ibuprofen, or other NSAIDs unless your health care provider told you to do so.  Keep all follow-up visits as told by your health care provider. This is important. This information is not intended to replace advice given to you by your health care provider. Make sure you discuss any questions you have with your health care provider. Document Revised: 11/14/2019 Document Reviewed: 11/14/2019 Elsevier Patient Education  2021 [employer].   I appreciate the  opportunity to care for you  Thank You   Marsa Aris , MD

## 2020-06-19 NOTE — Progress Notes (Signed)
Donna Lopez    [id number]    08-28-1965  Primary Care Physician:Lowne Chase, Grayling Congress, DO  Referring Physician: Zola Button, Grayling Congress, DO [address] [place],  [place] [address]   Chief complaint:  GERD, dysphagia  HPI:  55 year old very pleasant female here for follow-up visit with complaints of dysphagia and worsening GERD. She is having difficulty swallowing bread or meat.  She has significant sore throat and burning sensation especially when she eats spicy food. Denies any food impactions.  No vomiting, melena or rectal bleeding.  Family history positive for colon cancer  Colonoscopy 12/02/2017: Left side diverticulosis and internal hemorrhoids  EGD 02/14/2004 by Dr Jarold Motto showed hiatal hernia otherwise normal exam   Outpatient Encounter Medications as of 06/19/2020  Medication Sig  . Abatacept (ORENCIA CLICKJECT) 125 MG/ML SOAJ Inject 125 mg into the skin once a week.  Marland Kitchen acyclovir cream (ZOVIRAX) 5 % Apply 1 application topically every 3 (three) hours. (Patient taking differently: Apply 1 application topically as needed (fever blisters).)  . aspirin 81 MG chewable tablet Chew 1 tablet (81 mg total) by mouth 2 (two) times daily. (Patient taking differently: Chew 81 mg by mouth daily.)  . Calcium Carb-Cholecalciferol (CALCIUM + D3) 600-200 MG-UNIT TABS Take 1 capsule by mouth daily.  . Cholecalciferol (VITAMIN D) 50 MCG (2000 UT) CAPS Take 2,000 Units by mouth daily.  Marland Kitchen esomeprazole (NEXIUM) 40 MG capsule Take 1 capsule (40 mg total) by mouth daily.  . folic acid (FOLVITE) 1 MG tablet TAKE 2 TABLETS BY MOUTH EVERY DAY  . Glucos-Chondroit-Hyaluron-MSM (GLUCOSAMINE CHONDROITIN JOINT PO) Take 1 tablet by mouth daily.  Marland Kitchen L-Lysine 1000 MG TABS Take 1,000 mg by mouth daily.   . Methotrexate, PF, (OTREXUP) 25 MG/0.4ML SOAJ Inject 25 mg into the skin every 7 (seven) days.  . valACYclovir (VALTREX) 1000 MG tablet 2 po x1 at first sign of cold sore then  repeat in 12 h (Patient taking differently: Take 2,000 mg by mouth 2 (two) times daily as needed (fever blisters).)   No facility-administered encounter medications on file as of 06/19/2020.    Allergies as of 06/19/2020 - Review Complete 06/19/2020  Allergen Reaction Noted  . Penicillins Hives     Past Medical History:  Diagnosis Date  . Allergy   . Arthritis   . Carpal tunnel syndrome 10/2018  . DDD (degenerative disc disease), lumbar   . DDD (degenerative disc disease), thoracic   . Family history of adverse reaction to anesthesia    Mothers sister never woke up from surgery  . GERD (gastroesophageal reflux disease)   . Grade I diastolic dysfunction 2013  . Heart murmur   . Heart palpitations    History of  . History of hiatal hernia   . OA (osteoarthritis)   . Obese   . Osteoporosis   . Rheumatoid arthritis (HCC)   . 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 Vitamin D deficiency     Past Surgical History:  Procedure Laterality Date  . COLONOSCOPY  2014   DB- normal   . KNEE ARTHROPLASTY    . 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   Procedure: LEFT TOTAL KNEE ARTHROPLASTY;  Surgeon: Kathryne Hitch, MD;  Location: [facility];  Service: Orthopedics;  Laterality: Left;  . UPPER GASTROINTESTINAL ENDOSCOPY      Family History  Problem Relation Age of Onset  . Uterine cancer Mother   . Diabetes Father   .  Kidney cancer Father   . Colon cancer Sister 7  . Rectal cancer Sister   . Colon cancer Paternal Grandfather   . Colon polyps Brother   . Cancer Sister        breast   . Healthy Daughter   . Esophageal cancer Neg Hx   . Stomach cancer Neg Hx     Social History   Socioeconomic History  . Marital status: Married    Spouse name: Not on file  . Number of children: Not on file  . Years of education: Not on file  . Highest education level: Not on file  Occupational History    Employer: [employer]    Comment: marketing  Tobacco Use  . Smoking status: Never Smoker  .  Smokeless tobacco: Never Used  Vaping Use  . Vaping Use: Never used  Substance and Sexual Activity  . Alcohol use: Yes    Comment: rare alcohol use-once/twice a year  . Drug use: Never  . Sexual activity: Yes    Partners: Male  Other Topics Concern  . Not on file  Social History Narrative   Exercise--- sometimes walks 1x a week   Social Determinants of Health   Financial Resource Strain: Not on file  Food Insecurity: Not on file  Transportation Needs: Not on file  Physical Activity: Not on file  Stress: Not on file  Social Connections: Not on file  Intimate Partner Violence: Not on file      Review of systems: All other review of systems negative except as mentioned in the HPI.   Physical Exam: Vitals:   06/19/20 1011  BP: 124/70  Pulse: 98   Body mass index is 42.8 kg/m. Gen:      No acute distress Neuro: alert and oriented x 3 Psych: normal mood and affect  Data Reviewed:  Reviewed labs, radiology imaging, old records and pertinent past GI work up   Assessment and Plan/Recommendations:  55 year old very pleasant female with complaints of solid dysphagia and worsening GERD symptoms We will schedule for EGD with esophageal biopsies and esophageal dilation as needed.  Will need to exclude esophageal stricture or EOE. Continue Nexium daily and antireflux measures  The risks and benefits as well as alternatives of endoscopic procedure(s) have been discussed and reviewed. All questions answered. The patient agrees to proceed.    The patient was provided an opportunity to ask questions and all were answered. The patient agreed with the plan and demonstrated an understanding of the instructions.  Iona Beard , MD    CC: Zola Button, Grayling Congress, *

## 2020-06-25 NOTE — Telephone Encounter (Signed)
Patient advised that she is due for labs before we can send refill for Otrexup to [facility]. Patient is able to continue filling at [facility] and she'd prefer it. She has copay card information that she can provide to pharmacy when she calls for refills.  She plans to come get labs tomorrow/Wednesday. Due for next Otrexup on Thursday.  Chesley Mires, PharmD, MPH Clinical Pharmacist (Rheumatology and Pulmonology)

## 2020-06-26 ENCOUNTER — Other Ambulatory Visit: Payer: Self-pay | Admitting: Rheumatology

## 2020-06-26 ENCOUNTER — Other Ambulatory Visit: Payer: Self-pay | Admitting: *Deleted

## 2020-06-26 DIAGNOSIS — M0579 Rheumatoid arthritis with rheumatoid factor of multiple sites without organ or systems involvement: Secondary | ICD-10-CM

## 2020-06-26 DIAGNOSIS — Z79899 Other long term (current) drug therapy: Secondary | ICD-10-CM | POA: Diagnosis not present

## 2020-06-27 ENCOUNTER — Other Ambulatory Visit: Payer: Self-pay | Admitting: Physician Assistant

## 2020-06-27 ENCOUNTER — Telehealth: Payer: Self-pay | Admitting: Rheumatology

## 2020-06-27 ENCOUNTER — Other Ambulatory Visit: Payer: Self-pay | Admitting: *Deleted

## 2020-06-27 DIAGNOSIS — M0579 Rheumatoid arthritis with rheumatoid factor of multiple sites without organ or systems involvement: Secondary | ICD-10-CM

## 2020-06-27 LAB — COMPLETE METABOLIC PANEL WITH GFR
AG Ratio: 1.3 (calc) (ref 1.0–2.5)
ALT: 16 U/L (ref 6–29)
AST: 18 U/L (ref 10–35)
Albumin: 3.9 g/dL (ref 3.6–5.1)
Alkaline phosphatase (APISO): 99 U/L (ref 37–153)
BUN/Creatinine Ratio: 37 (calc) — ABNORMAL HIGH (ref 6–22)
BUN: 17 mg/dL (ref 7–25)
CO2: 28 mmol/L (ref 20–32)
Calcium: 9 mg/dL (ref 8.6–10.4)
Chloride: 105 mmol/L (ref 98–110)
Creat: 0.46 mg/dL — ABNORMAL LOW (ref 0.50–1.05)
GFR, Est African American: 130 mL/min/{1.73_m2} (ref >=60)
GFR, Est Non African American: 112 mL/min/{1.73_m2} (ref >=60)
Globulin: 3 g/dL (calc) (ref 1.9–3.7)
Glucose, Bld: 100 mg/dL — ABNORMAL HIGH (ref 65–99)
Potassium: 4.4 mmol/L (ref 3.5–5.3)
Sodium: 142 mmol/L (ref 135–146)
Total Bilirubin: 0.3 mg/dL (ref 0.2–1.2)
Total Protein: 6.9 g/dL (ref 6.1–8.1)

## 2020-06-27 LAB — CBC WITH DIFFERENTIAL/PLATELET
Absolute Monocytes: 861 cells/uL (ref 200–950)
Basophils Absolute: 59 cells/uL (ref 0–200)
Basophils Relative: 0.6 %
Eosinophils Absolute: 238 cells/uL (ref 15–500)
Eosinophils Relative: 2.4 %
HCT: 38.7 % (ref 35.0–45.0)
Hemoglobin: 13 g/dL (ref 11.7–15.5)
Lymphs Abs: 2584 cells/uL (ref 850–3900)
MCH: 29.3 pg (ref 27.0–33.0)
MCHC: 33.6 g/dL (ref 32.0–36.0)
MCV: 87.2 fL (ref 80.0–100.0)
MPV: 10.5 fL (ref 7.5–12.5)
Monocytes Relative: 8.7 %
Neutro Abs: 6158 cells/uL (ref 1500–7800)
Neutrophils Relative %: 62.2 %
Platelets: 330 10*3/uL (ref 140–400)
RBC: 4.44 10*6/uL (ref 3.80–5.10)
RDW: 16.2 % — ABNORMAL HIGH (ref 11.0–15.0)
Total Lymphocyte: 26.1 %
WBC: 9.9 10*3/uL (ref 3.8–10.8)

## 2020-06-27 MED ORDER — OTREXUP 25 MG/0.4ML ~~LOC~~ SOAJ
25.0000 mg | SUBCUTANEOUS | 0 refills | Status: DC
Start: 2020-06-27 — End: 2020-06-27

## 2020-06-27 NOTE — Telephone Encounter (Signed)
Last Visit: 03/01/2020 Next Visit: 08/03/2020 Labs: 06/26/2020 Creatinine is borderline low. GFR is WNL. Rest of CMP WNL. RDW is slightly elevated. Rest of CBC WNL.   Current Dose per office note 03/01/2020: methotrexate 25 mg sq injections DX: Rheumatoid arthritis involving multiple sites with positive rheumatoid factor   Last Fill: 03/16/2020  Okay to refill Otrexup?

## 2020-06-27 NOTE — Telephone Encounter (Signed)
Patient calling in reference to her Otrexup. Patient is unable to get it. [facility] called to let patient know they are not able to get it at all. Pharmacy had no recommendations for patient. Please call to advise.

## 2020-06-27 NOTE — Telephone Encounter (Signed)
-----   Message from Gearldine Bienenstock, PA-C sent at 06/27/2020  8:06 AM EST ----- Creatinine is borderline low.  GFR is WNL. Rest of CMP WNL.  RDW is slightly elevated. Rest of CBC WNL.

## 2020-06-27 NOTE — Progress Notes (Signed)
Creatinine is borderline low.  GFR is WNL. Rest of CMP WNL.  RDW is slightly elevated. Rest of CBC WNL.

## 2020-06-29 NOTE — Telephone Encounter (Signed)
Per pharmacy, Otrexup medication has switched companies and is temporarily unavailable due to new company trying to change NDC's.  Submitted a Urgent Prior Authorization request to [facility] for RASUVO via Cover My Meds. Will update once we receive a response.   Key# [id number]  Do we have any samples that we can provide patient?

## 2020-06-29 NOTE — Telephone Encounter (Signed)
Contacted patient and advised we have one sample of the Otrexup she may come by the office to pick up. Patient states she did not want to be late on her dose of MTX and took the tablets she had available at home. Patient advised we have completed the PA for Rasuvo and will let her know when we hear something.

## 2020-07-02 NOTE — Telephone Encounter (Signed)
Received a fax regarding Prior Authorization from [facility] for RASUVO. Authorization has been DENIED because patient has not tried all formulary alternatives- Reditrex.

## 2020-07-03 ENCOUNTER — Telehealth: Payer: Self-pay

## 2020-07-03 MED FILL — OTREXUP 25 MG/0.4 ML AUTO-I: 25 | 28 days supply | Qty: 2 | Fill #0

## 2020-07-03 NOTE — Telephone Encounter (Signed)
PA initiated via Covermymeds KEY: [id number]. Awaiting determination.

## 2020-07-04 NOTE — Telephone Encounter (Signed)
PA denied because insurance plan does not cover medications that are available OTC, however 40mg  is not available OTC. I will work on for this.

## 2020-07-04 NOTE — Telephone Encounter (Signed)
Appeal letter faxed to [facility] at [phone]. Awaiting determination.

## 2020-07-05 ENCOUNTER — Encounter: Payer: Self-pay | Admitting: Gastroenterology

## 2020-07-05 DIAGNOSIS — F4320 Adjustment disorder, unspecified: Secondary | ICD-10-CM | POA: Diagnosis not present

## 2020-07-05 NOTE — Telephone Encounter (Signed)
Medication Samples have been provided to the patient.  Drug name: Otrexup  Strength: 25 mg       Qty: 1 LOT: [id number]  Exp.Date: 04/2021  Dosing instructions: Inject one pen into skin once weekly.

## 2020-07-10 NOTE — Telephone Encounter (Signed)
[facility] able to restock on Otrexup. Patient's medication was filled. Nothing further needed

## 2020-07-16 NOTE — Telephone Encounter (Signed)
I have not seen a response for appeal. Appeal information refaxed.

## 2020-07-17 NOTE — Discharge Instructions (Signed)
Zoledronic Acid Injection (Paget's Disease, Osteoporosis) What is this medicine? ZOLEDRONIC ACID (ZOE le dron ik AS id) slows calcium loss from bones. It treats Paget's disease and osteoporosis. It may be used in other people at risk for bone loss. This medicine may be used for other purposes; ask your health care provider or pharmacist if you have questions. COMMON BRAND NAME(S): Reclast, Zometa What should I tell my health care provider before I take this medicine? They need to know if you have any of these conditions:  bleeding disorder  cancer  dental disease  kidney disease  low levels of calcium in the blood  low red blood cell counts  lung or breathing disease (asthma)  receiving steroids like dexamethasone or prednisone  an unusual or allergic reaction to zoledronic acid, other medicines, foods, dyes, or preservatives  pregnant or trying to get pregnant  breast-feeding How should I use this medicine? This drug is injected into a vein. It is given by a health care provider in a hospital or clinic setting. A special MedGuide will be given to you before each treatment. Be sure to read this information carefully each time. Talk to your health care provider about the use of this drug in children. Special care may be needed. Overdosage: If you think you have taken too much of this medicine contact a poison control center or emergency room at once. NOTE: This medicine is only for you. Do not share this medicine with others. What if I miss a dose? Keep appointments for follow-up doses. It is important not to miss your dose. Call your health care provider if you are unable to keep an appointment. What may interact with this medicine?  certain antibiotics given by injection  NSAIDs, medicines for pain and inflammation, like ibuprofen or naproxen  some diuretics like bumetanide, furosemide  teriparatide This list may not describe all possible interactions. Give your health  care provider a list of all the medicines, herbs, non-prescription drugs, or dietary supplements you use. Also tell them if you smoke, drink alcohol, or use illegal drugs. Some items may interact with your medicine. What should I watch for while using this medicine? Visit your health care provider for regular checks on your progress. It may be some time before you see the benefit from this drug. Some people who take this drug have severe bone, joint, or muscle pain. This drug may also increase your risk for jaw problems or a broken thigh bone. Tell your health care provider right away if you have severe pain in your jaw, bones, joints, or muscles. Tell you health care provider if you have any pain that does not go away or that gets worse. You should make sure you get enough calcium and vitamin D while you are taking this drug. Discuss the foods you eat and the vitamins you take with your health care provider. You may need blood work done while you are taking this drug. Tell your dentist and dental surgeon that you are taking this drug. You should not have major dental surgery while on this drug. See your dentist to have a dental exam and fix any dental problems before starting this drug. Take good care of your teeth while on this drug. Make sure you see your dentist for regular follow-up appointments. What side effects may I notice from receiving this medicine? Side effects that you should report to your doctor or health care provider as soon as possible:  allergic reactions (skin rash, itching or   hives; swelling of the face, lips, or tongue)  bone pain  infection (fever, chills, cough, sore throat, pain or trouble passing urine)  jaw pain, especially after dental work  joint pain  kidney injury (trouble passing urine or change in the amount of urine)  low calcium levels (fast heartbeat; muscle cramps or pain; pain, tingling, or numbness in the hands or feet; seizures)  low red blood cell  counts (trouble breathing; feeling faint; lightheaded, falls; unusually weak or tired)  muscle pain  palpitations  redness, blistering, peeling, or loosening of the skin, including inside the mouth Side effects that usually do not require medical attention (report to your doctor or health care provider if they continue or are bothersome):  diarrhea  eye irritation, itching, or pain  fever  general ill feeling or flu-like symptoms  headache  increase in blood pressure  nausea  pain, redness, or irritation at site where injected  stomach pain  upset stomach This list may not describe all possible side effects. Call your doctor for medical advice about side effects. You may report side effects to FDA at 1-800-FDA-1088. Where should I keep my medicine? This drug is given in a hospital or clinic. It will not be stored at home. NOTE: This sheet is a summary. It may not cover all possible information. If you have questions about this medicine, talk to your doctor, pharmacist, or health care provider.  2021 Elsevier/Gold Standard (2019-02-21 11:46:18)   

## 2020-07-18 ENCOUNTER — Other Ambulatory Visit: Payer: Self-pay

## 2020-07-18 ENCOUNTER — Ambulatory Visit (HOSPITAL_COMMUNITY)
Admission: RE | Admit: 2020-07-18 | Discharge: 2020-07-18 | Disposition: A | Discharge status: Home / Self Care | Payer: BC Managed Care – PPO | Source: Ambulatory Visit | Attending: Rheumatology | Admitting: Rheumatology

## 2020-07-18 DIAGNOSIS — M810 Age-related osteoporosis without current pathological fracture: Secondary | ICD-10-CM

## 2020-07-18 MED ORDER — ACETAMINOPHEN 325 MG PO TABS
ORAL_TABLET | ORAL | Status: AC
  Administered 2020-07-18: 650 mg via ORAL
  Filled 2020-07-18: qty 2

## 2020-07-18 MED ORDER — DIPHENHYDRAMINE HCL 25 MG PO CAPS: 25.0000 mg | ORAL_CAPSULE | Freq: Once | ORAL | Status: AC

## 2020-07-18 MED ORDER — ACETAMINOPHEN 325 MG PO TABS: 650.0000 mg | ORAL_TABLET | Freq: Once | ORAL | Status: AC

## 2020-07-18 MED ORDER — DIPHENHYDRAMINE HCL 25 MG PO CAPS
ORAL_CAPSULE | ORAL | Status: AC
  Administered 2020-07-18: 25 mg via ORAL
  Filled 2020-07-18: qty 1

## 2020-07-18 MED ORDER — ZOLEDRONIC ACID 5 MG/100ML IV SOLN
5.0000 mg | Freq: Once | INTRAVENOUS | Status: AC
  Administered 2020-07-18: 5 mg via INTRAVENOUS

## 2020-07-18 MED ORDER — ZOLEDRONIC ACID 5 MG/100ML IV SOLN
INTRAVENOUS | Status: AC
  Filled 2020-07-18: qty 100

## 2020-07-20 NOTE — Telephone Encounter (Signed)
Pt called. LVM to return call 

## 2020-07-20 NOTE — Telephone Encounter (Signed)
Spoke w/ Latina Craver at [employer] [phone])- they received appeal but is considered invalid because they did not receive Pt's signed consent form that was mailed to Pt's address on 07/06/20- informed that the form is not on the PA denial information that we received states only letter is needed for appeal. Ariel apologized for inconvenience.   Heather or Melton Alar- can you contact Pt and see if she is still needing this medication or if she is getting it over-the-counter at 20mg ? If she would like for to try to resubmit appeal- she will need to drop off signed consent form that she should have received in the mail by [employer].

## 2020-07-20 NOTE — Progress Notes (Signed)
Office Visit Note  Patient: Donna Lopez             Date of Birth: 08/13/1965           MRN: [id number]             PCP: Donato Schultz, DO Referring: Donato Schultz, * Visit Date: 08/03/2020 Occupation: @  Subjective:  Medication Management   History of Present Illness: Donna Lopez is a 55 y.o. female history of seropositive rheumatoid arthritis, osteoarthritis and degenerative disc disease.  She states she continues to have some pain and swelling in her hands despite being on Orencia and methotrexate combination.  She had the best response to Humira which she was on for several years but it quit working.  She had inadequate response to Simponi in the past.  She has been on Orencia for almost a year now.  She denies any discomfort in her knee joints or her hips.  None of the other joints are painful.  She had her first Reclast infusion on July 18, 2020.  She had increased pain after the Reclast infusion which lasted for 1 day.  She has been taking calcium and vitamin D.  Activities of Daily Living:  Patient reports morning stiffness for 15-60  minutes.   Patient Denies nocturnal pain.  Difficulty dressing/grooming: Denies Difficulty climbing stairs: Denies Difficulty getting out of chair: Denies Difficulty using hands for taps, buttons, cutlery, and/or writing: Reports  Review of Systems  Constitutional: Negative for fatigue.  HENT: Negative for mouth sores, mouth dryness and nose dryness.   Eyes: Negative for pain, itching and dryness.  Respiratory: Negative for shortness of breath and difficulty breathing.   Cardiovascular: Negative for chest pain and palpitations.  Gastrointestinal: Negative for blood in stool, constipation and diarrhea.  Endocrine: Negative for increased urination.  Genitourinary: Negative for difficulty urinating.  Musculoskeletal: Positive for arthralgias, joint pain, joint swelling and morning stiffness. Negative for myalgias,  muscle tenderness and myalgias.  Skin: Negative for color change, rash and redness.  Allergic/Immunologic: Negative for susceptible to infections.  Neurological: Negative for dizziness, numbness, headaches, memory loss and weakness.  Hematological: Negative for bruising/bleeding tendency.  Psychiatric/Behavioral: Negative for confusion.    PMFS History:  Patient Active Problem List   Diagnosis Date Noted  . Unilateral primary osteoarthritis, left knee 12/17/2018  . Status post total left knee replacement 12/17/2018  . Primary osteoarthritis of left knee 11/23/2018  . Carpal tunnel syndrome, left upper limb 11/23/2018  . BMI 40.0-44.9, adult (HCC) 07/12/2018  . Primary osteoarthritis of both knees 12/24/2016  . 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 DDD (degenerative disc disease), lumbar 11/17/2016  . Vitamin D deficiency 11/17/2016  . History of rosacea/ History of acne 11/17/2016  . High risk medications (not anticoagulants) long-term use 06/25/2016  . History of degenerative disc disease 06/25/2016  . Severe obesity (BMI >= 40) (HCC) 06/13/2014  . Left-sided thoracic back pain 07/19/2013  . PALPITATIONS 10/17/2009  . UTI 05/24/2009  . Rheumatoid arthritis (HCC) 05/24/2009  . Hair loss 02/22/2009  . VAGINAL BLEEDING 10/05/2008  . Carrier of group B Streptococcus 07/28/2007  . GERD 12/28/2006  . CARDIAC MURMUR, HX OF 12/28/2006  . HIATAL HERNIA, HX OF 12/28/2006    Past Medical History:  Diagnosis Date  . Allergy   . Arthritis   . Carpal tunnel syndrome 10/2018  . DDD (degenerative disc disease), lumbar   . DDD (degenerative disc disease), thoracic   .  Family history of adverse reaction to anesthesia    Mothers sister never woke up from surgery  . GERD (gastroesophageal reflux disease)   . Grade I diastolic dysfunction 2013  . Heart murmur   . Heart palpitations    History of  . History of hiatal hernia   . OA (osteoarthritis)   . Obese   .  Osteoporosis   . Rheumatoid arthritis (HCC)   . 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 Vitamin D deficiency     Family History  Problem Relation Age of Onset  . Uterine cancer Mother   . Diabetes Father   . Kidney cancer Father   . Colon cancer Sister 33  . Rectal cancer Sister   . Colon cancer Paternal Grandfather   . Colon polyps Brother   . Cancer Sister        breast   . Healthy Daughter   . Esophageal cancer Neg Hx   . Stomach cancer Neg Hx    Past Surgical History:  Procedure Laterality Date  . COLONOSCOPY  2014   DB- normal   . KNEE ARTHROPLASTY    . 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   Procedure: LEFT TOTAL KNEE ARTHROPLASTY;  Surgeon: Kathryne Hitch, MD;  Location: [facility];  Service: Orthopedics;  Laterality: Left;  . UPPER GASTROINTESTINAL ENDOSCOPY     Social History   Social History Narrative   Exercise--- sometimes walks 1x a week   Immunization History  Administered Date(s) Administered  . Influenza Whole 02/08/2009  . Influenza,inj,Quad PF,6+ Mos 03/09/2013, 06/13/2014, 06/16/2016, 02/14/2019, 03/30/2020  . PFIZER(Purple Top)SARS-COV-2 Vaccination 08/26/2019, 09/27/2019, 05/30/2020  . Td 09/29/2001  . Tdap 06/13/2014     Objective: Vital Signs: BP 136/85 (BP Location: Left Arm, Patient Position: Sitting, Cuff Size: Normal)   Pulse 90   Resp 16   Ht 5\' 2"  (1.575 m)   Wt 234 lb 6.4 oz (106.3 kg)   LMP 08/18/2018   BMI 42.87 kg/m    Physical Exam Vitals and nursing note reviewed.  Constitutional:      Appearance: She is well-developed.  HENT:     Head: Normocephalic and atraumatic.  Eyes:     Conjunctiva/sclera: Conjunctivae normal.  Cardiovascular:     Rate and Rhythm: Normal rate and regular rhythm.     Heart sounds: Normal heart sounds.  Pulmonary:     Effort: Pulmonary effort is normal.     Breath sounds: Normal breath sounds.  Abdominal:     General: Bowel sounds are normal.     Palpations: Abdomen is soft.  Musculoskeletal:      Cervical back: Normal range of motion.  Lymphadenopathy:     Cervical: No cervical adenopathy.  Skin:    General: Skin is warm and dry.     Capillary Refill: Capillary refill takes less than 2 seconds.  Neurological:     Mental Status: She is alert and oriented to person, place, and time.  Psychiatric:        Behavior: Behavior normal.      Musculoskeletal Exam: Normal symmetric range of motion.  Shoulder joints and elbow joints with good range of motion.  She has synovial thickening over MCPs, PIPs and wrist joints.  There was synovitis in some of the MCPs and PIPs as described below.  Hip joints with good range of motion.  Left knee joint is replaced tenderness along with some warmth.  Right knee joint had good range of motion with some warmth.  There was no  tenderness over ankles or MTPs.  CDAI Exam: CDAI Score: 7.8  Patient Global: 4 mm; Provider Global: 4 mm Swollen: 4 ; Tender: 3  Joint Exam 08/03/2020      Right  Left  Wrist      Tender  MCP 2  Swollen   Swollen Tender  PIP 3  Swollen Tender     Knee     Swollen      Investigation: No additional findings.  Imaging: No results found.  Recent Labs: Lab Results  Component Value Date   WBC 9.9 06/26/2020   HGB 13.0 06/26/2020   PLT 330 06/26/2020   NA 142 06/26/2020   K 4.4 06/26/2020   CL 105 06/26/2020   CO2 28 06/26/2020   GLUCOSE 100 (H) 06/26/2020   BUN 17 06/26/2020   CREATININE 0.46 (L) 06/26/2020   BILITOT 0.3 06/26/2020   ALKPHOS 96 03/30/2020   AST 18 06/26/2020   ALT 16 06/26/2020   PROT 6.9 06/26/2020   ALBUMIN 3.8 03/30/2020   CALCIUM 9.0 06/26/2020   GFRAA 130 06/26/2020   QFTBGOLDPLUS NEGATIVE 10/12/2019    Speciality Comments: Humira- quit working Simponi- inadequate response Orencia-since 07/2019 Reclast start 07/18/20  Procedures:  No procedures performed Allergies: Penicillins   Assessment / Plan:     Visit Diagnoses: Rheumatoid arthritis involving multiple sites with positive  rheumatoid factor (HCC) - Positive rheumatoid factor, positive anti-CCP, erosive disease. She previously had an inadequate response to Humira and Simponi: She has noticed some improvement on combination of Orencia and methotrexate.  Although she continues to have some pain and swelling and stiffness in her hands.  I detailed discussion with the different treatment options with her.  Indications side effects contraindications of Kevzara and Rinvoq were discussed.  She would like to do some more reading and thinking about it.  We will discuss at the follow-up visit if she wants to switch to any of these agents.  High risk medications (not anticoagulants) long-term use - Orencia 125 mg sq injections once weekly, methotrexate 25 mg sq injections, and folic acid 2 mg po daily.  -Her labs were normal in February 2022.  She will get labs in 3 months and then every 3 months to monitor for drug toxicity.  Plan: QuantiFERON-TB Gold Plus with next labs in May.  She was advised to discontinue Orencia and methotrexate in case she develops infection.  She will resume the medications when the infection resolves.  She is fully vaccinated against COVID-19 and also had a third dose.  A fourth dose was discussed which can be taken 6 months after the third dose.  Instructions about stopping methotrexate for a week after the booster was discussed.  I have also encouraged her to get shingrix and pneumonia vaccine.  Instructions were placed in the AVS.  Primary osteoarthritis of right knee-she is currently not having much discomfort.  S/P total knee arthroplasty, left-she continues to have some discomfort and warmth on palpation.  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  DDD (degenerative disc disease), lumbar-chronic discomfort.  Age-related osteoporosis without current pathological fracture - DEXA on 02/02/20: Left femur neck BMD 0.437 with T-score -3.7.  Repeat DEXA due in 2  years.  She received first dose of Reclast IV on July 18, 2020.  She had arthralgias for 1 day and then resolved.  Use of calcium and vitamin D was emphasized.  History of vitamin D deficiency-she is on supplement.  History of hiatal  hernia  History of rosacea/ History of acne  History of gastroesophageal reflux (GERD) - She is not a good candidate for an oral bisphosphonate.   BMI 42.87-increased risk of heart disease with rheumatoid arthritis was discussed.  Dietary modifications and need for regular exercise was emphasized.  Instructions were placed in the AVS.  Orders: Orders Placed This Encounter  Procedures  . QuantiFERON-TB Gold Plus   No orders of the defined types were placed in this encounter.    Follow-Up Instructions: Return in about 3 months (around 11/03/2020) for Rheumatoid arthritis, Osteoarthritis.   Pollyann Savoy, MD  Note - This record has been created using Animal nutritionist.  Chart creation errors have been sought, but may not always  have been located. Such creation errors do not reflect on  the standard of medical care.

## 2020-07-24 NOTE — Telephone Encounter (Signed)
Spoke with patient. Pt states she doesn't recall getting a form to sign to approve the appeal but she will through the paper work again to confirm.

## 2020-07-24 NOTE — Telephone Encounter (Signed)
No call back. Closing telephone note.

## 2020-07-31 ENCOUNTER — Encounter: Payer: Self-pay | Admitting: Gastroenterology

## 2020-07-31 ENCOUNTER — Telehealth: Payer: Self-pay | Admitting: Gastroenterology

## 2020-07-31 NOTE — Telephone Encounter (Signed)
Good morning,  Patient cancelled procedure for 08/01/20 at 10:00am with Dr. Lavon Paganini.  Patient states she received a letter she no longer has insurance. Patient states she will call to schedule when she has insurance to pay for procedure.  Thank you

## 2020-07-31 NOTE — Telephone Encounter (Signed)
ok 

## 2020-08-01 ENCOUNTER — Encounter: Payer: BC Managed Care – PPO | Admitting: Gastroenterology

## 2020-08-03 ENCOUNTER — Ambulatory Visit: Payer: BC Managed Care – PPO | Admitting: Rheumatology

## 2020-08-03 ENCOUNTER — Other Ambulatory Visit: Payer: Self-pay

## 2020-08-03 ENCOUNTER — Encounter: Payer: Self-pay | Admitting: Rheumatology

## 2020-08-03 VITALS — BP 136/85 | HR 90 | Resp 16 | Ht 62.0 in | Wt 234.4 lb

## 2020-08-03 DIAGNOSIS — M1711 Unilateral primary osteoarthritis, right knee: Secondary | ICD-10-CM

## 2020-08-03 DIAGNOSIS — Z872 Personal history of diseases of the skin and subcutaneous tissue: Secondary | ICD-10-CM

## 2020-08-03 DIAGNOSIS — M0579 Rheumatoid arthritis with rheumatoid factor of multiple sites without organ or systems involvement: Secondary | ICD-10-CM | POA: Diagnosis not present

## 2020-08-03 DIAGNOSIS — Z6841 Body Mass Index (BMI) 40.0 and over, adult: Secondary | ICD-10-CM

## 2020-08-03 DIAGNOSIS — Z79899 Other long term (current) drug therapy: Secondary | ICD-10-CM

## 2020-08-03 DIAGNOSIS — Z8719 Personal history of other diseases of the digestive system: Secondary | ICD-10-CM

## 2020-08-03 DIAGNOSIS — M5134 Other intervertebral disc degeneration, thoracic region: Secondary | ICD-10-CM

## 2020-08-03 DIAGNOSIS — Z96652 Presence of left artificial knee joint: Secondary | ICD-10-CM

## 2020-08-03 DIAGNOSIS — Z8639 Personal history of other endocrine, nutritional and metabolic disease: Secondary | ICD-10-CM

## 2020-08-03 DIAGNOSIS — M5136 Other intervertebral disc degeneration, lumbar region: Secondary | ICD-10-CM

## 2020-08-03 DIAGNOSIS — M810 Age-related osteoporosis without current pathological fracture: Secondary | ICD-10-CM

## 2020-08-03 NOTE — Patient Instructions (Addendum)
Standing Labs We placed an order today for your standing lab work.   Please have your standing labs drawn in May and every 3 months  If possible, please have your labs drawn 2 weeks prior to your appointment so that the provider can discuss your results at your appointment.  We have open lab daily Monday through Thursday from 1:30-4:30 PM and Friday from 1:30-4:00 PM at the office of Dr. Beverly Suriano, [facility].   Please be advised, all patients with office appointments requiring lab work will take precedents over walk-in lab work.  If possible, please come for your lab work on Monday and Friday afternoons, as you may experience shorter wait times. The office is located at [address], [address], [place], [place] [address] No appointment is necessary.   Labs are drawn by [facility]. Please bring your co-pay at the time of your lab draw.  You may receive a bill from [facility] for your lab work.  If you wish to have your labs drawn at another location, please call the office 24 hours in advance to send orders.  If you have any questions regarding directions or hours of operation,  please call [phone].   As a reminder, please drink plenty of water prior to coming for your lab work. Thanks!   Vaccines You are taking a medication(s) that can suppress your immune system.  The following immunizations are recommended: . Flu annually . Covid-19  . Pneumonia (Pneumovax 23 and Prevnar 13 spaced at least 1 year apart) . Shingrix (after age 50)  Please check with your PCP to make sure you are up to date.  Heart Disease Prevention   Your inflammatory disease increases your risk of heart disease which includes heart attack, stroke, atrial fibrillation (irregular heartbeats), high blood pressure, heart failure and atherosclerosis (plaque in the arteries).  It is important to reduce your risk by:   . Keep blood pressure, cholesterol, and blood sugar at healthy levels    . Smoking Cessation   . Maintain a healthy weight  o BMI 20-25   . Eat a healthy diet  o Plenty of fresh fruit, vegetables, and whole grains  o Limit saturated fats, foods high in sodium, and added sugars  o DASH and Mediterranean diet   . Increase physical activity  o Recommend moderate physically activity for 150 minutes per week/ 30 minutes a day for five days a week These can be broken up into three separate ten-minute sessions during the day.   . Reduce Stress  . Meditation, slow breathing exercises, yoga, coloring books  . Dental visits twice a year    

## 2020-08-04 IMAGING — MG DIGITAL SCREENING BILATERAL MAMMOGRAM WITH CAD
7 series · 7 of 7 positions shown · non-contrast
Comparison: Previous exam(s).

CLINICAL DATA: Screening.

EXAM:
DIGITAL SCREENING BILATERAL MAMMOGRAM WITH CAD

[L MLO (1 of 3)]
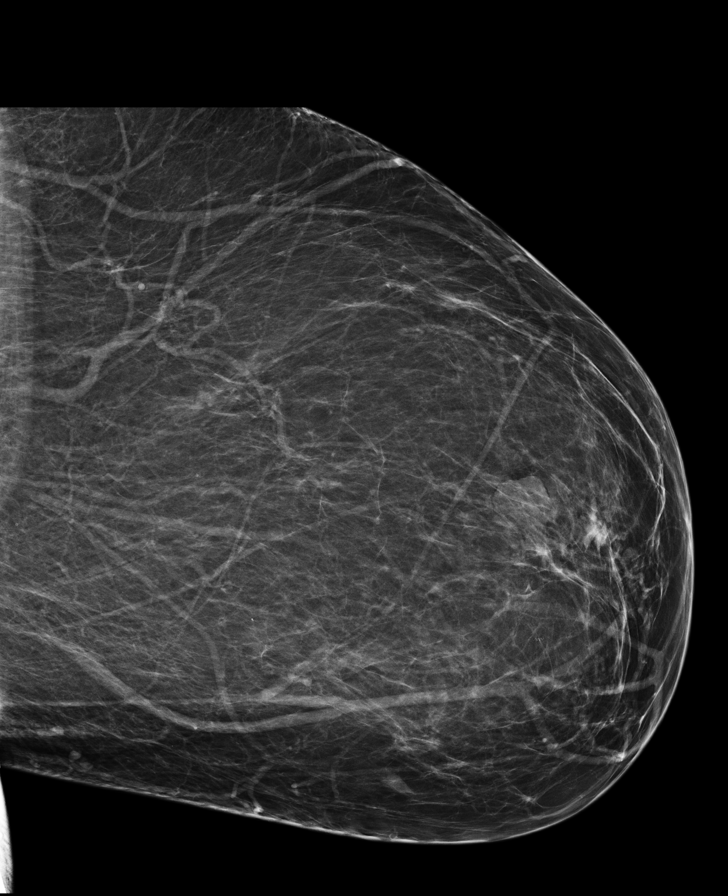

[R CC]
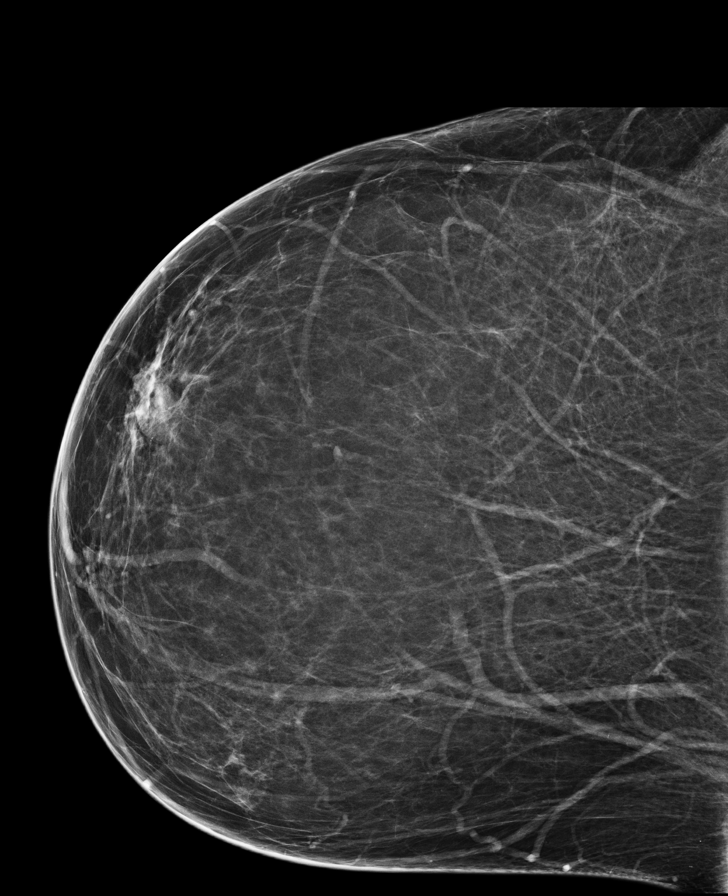

[R MLO (1 of 2)]
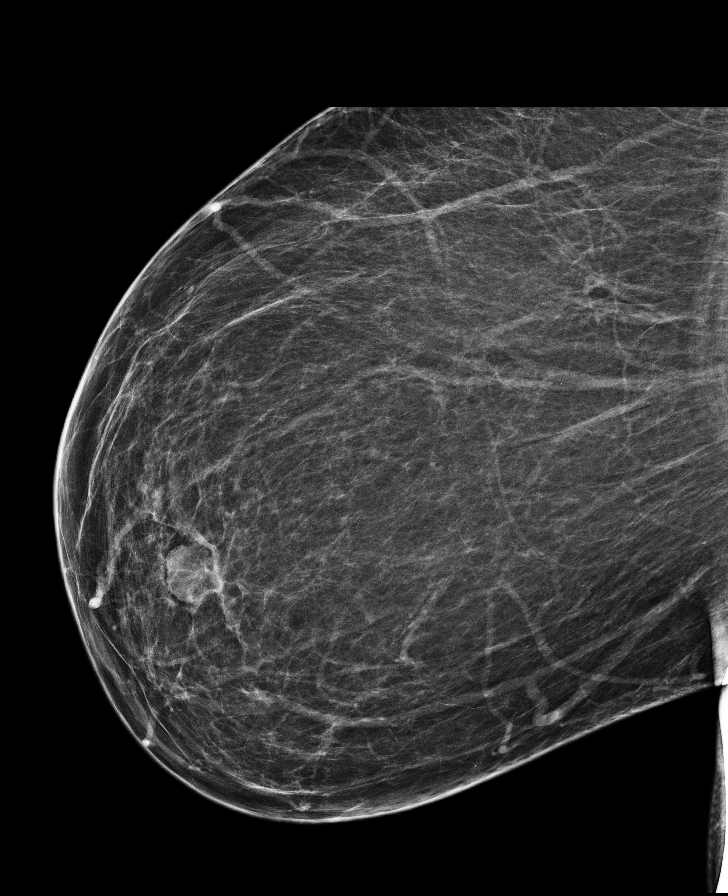

[R MLO (2 of 2)]
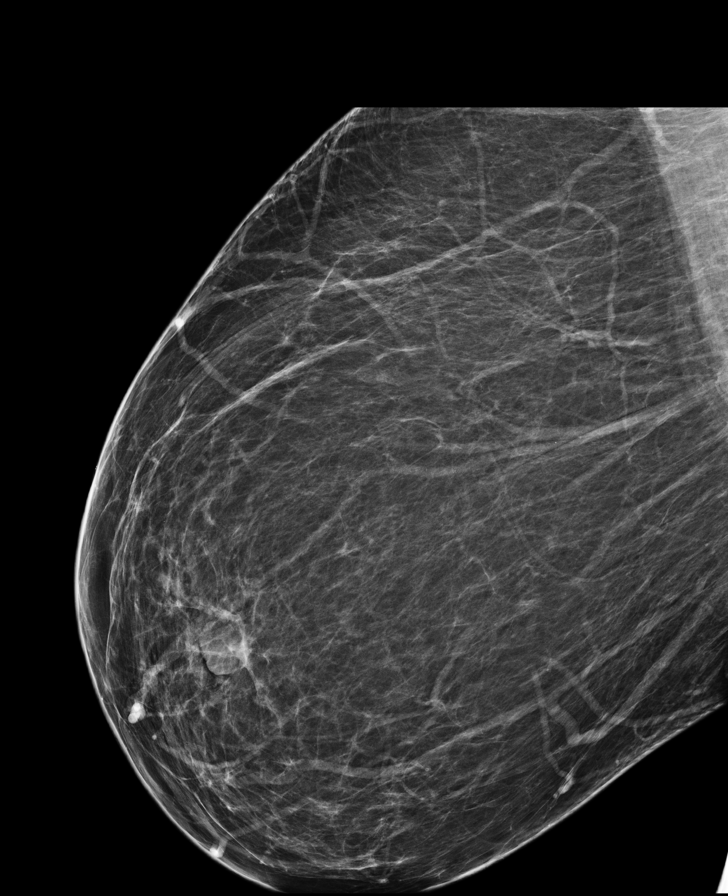

[L MLO (2 of 3)]
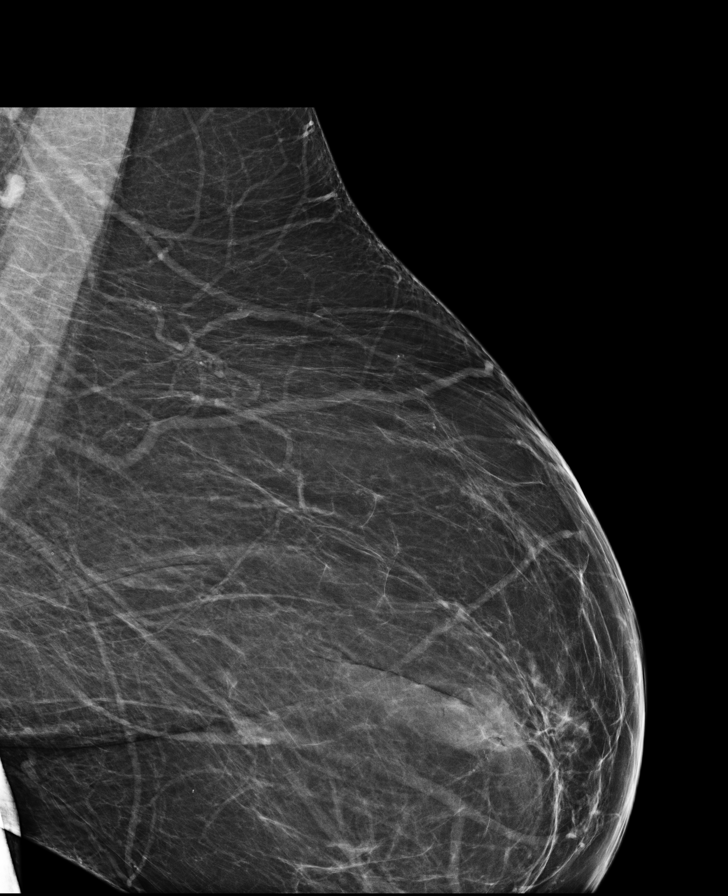

[L MLO (3 of 3)]
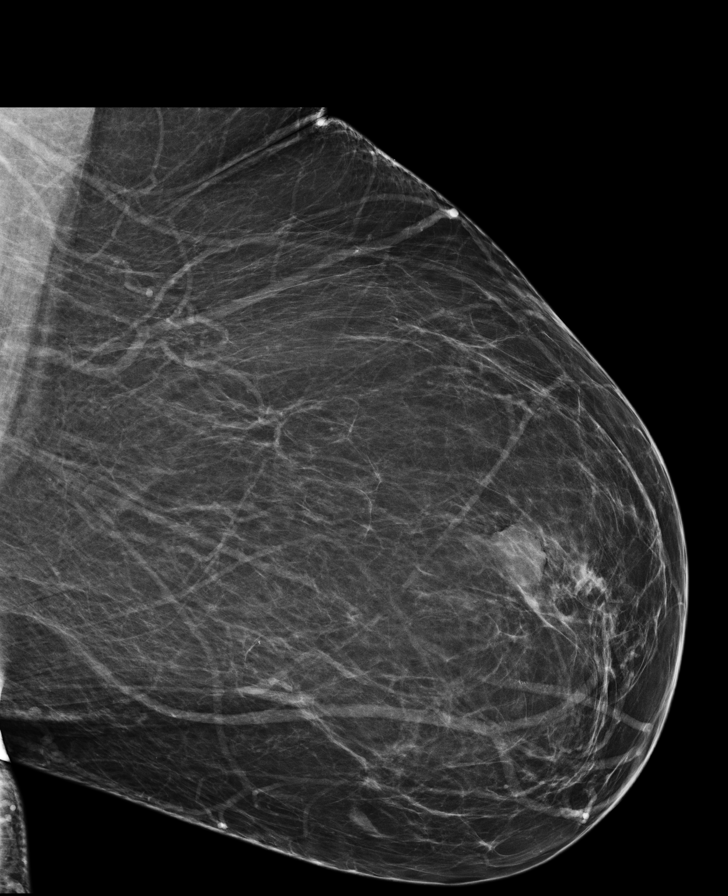

[L CC]
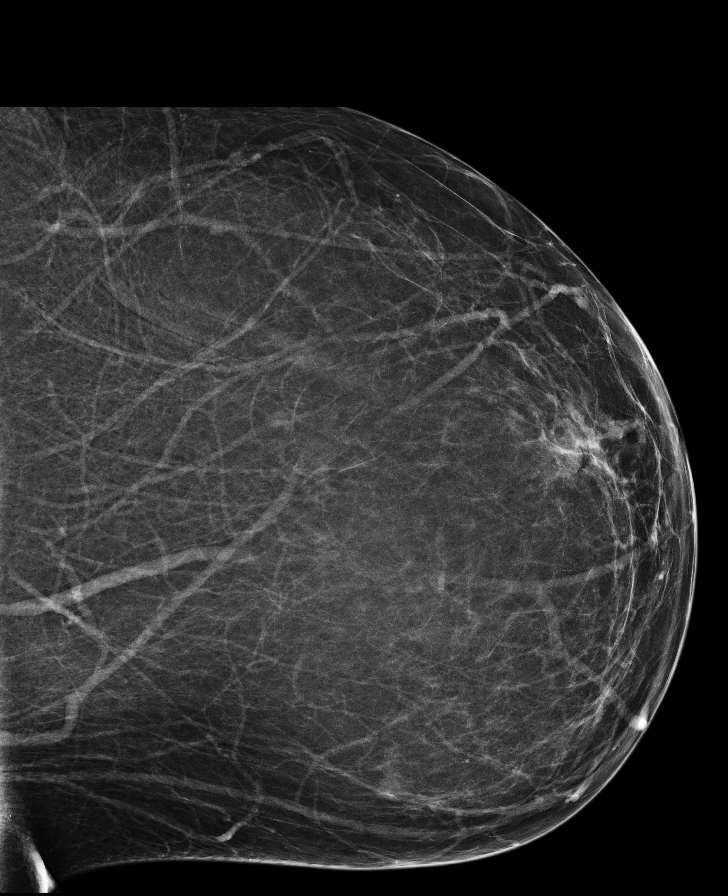

[7 of 7 positions shown; findings below may reference images not displayed]

ACR Breast Density Category b: There are scattered areas of
fibroglandular density.
FINDINGS: There are no findings suspicious for malignancy. Images were
processed with CAD.
IMPRESSION: No mammographic evidence of malignancy. A result letter of this
screening mammogram will be mailed directly to the patient.

RECOMMENDATION:
Screening mammogram in one year. (Code:AS-G-LCT)

BI-RADS CATEGORY  1: Negative.

## 2020-08-06 IMAGING — DX PORTABLE LEFT KNEE - 1-2 VIEW
2 series · 2 of 2 positions shown · non-contrast
Comparison: 11/23/2018.

CLINICAL DATA: Left knee surgery.

EXAM:
PORTABLE LEFT KNEE - 1-2 VIEW

[knee ap]
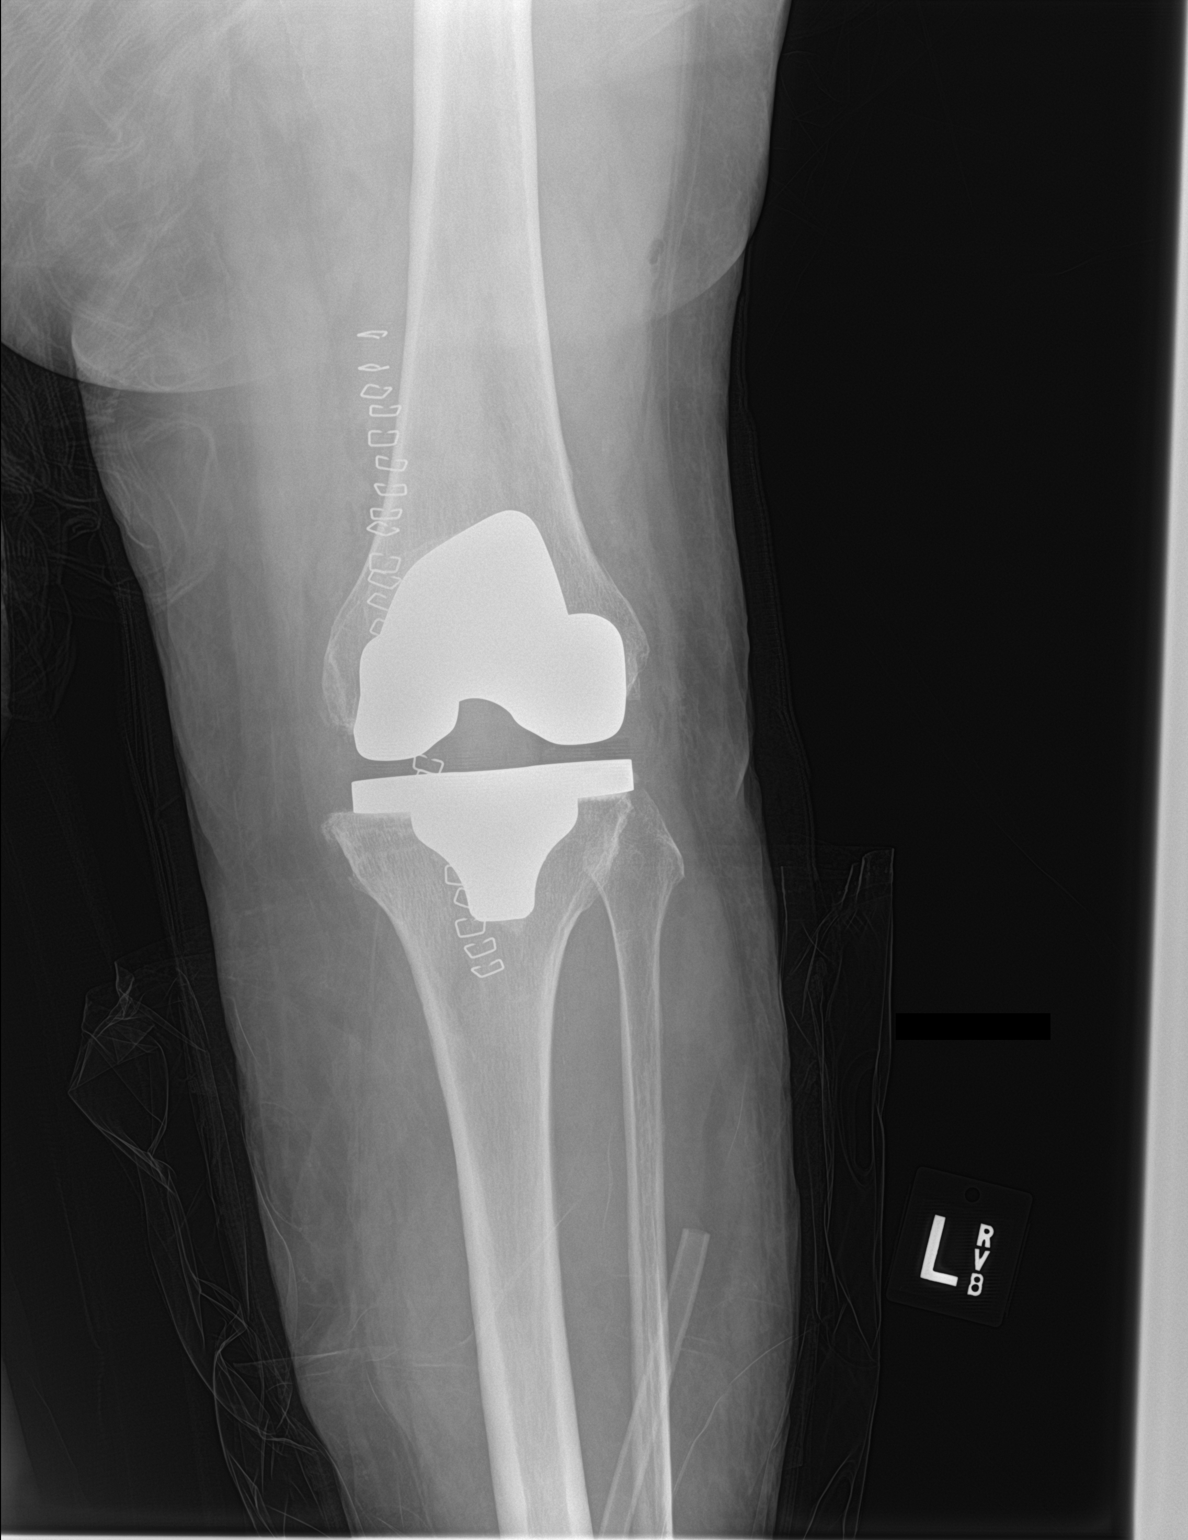

[knee lat]
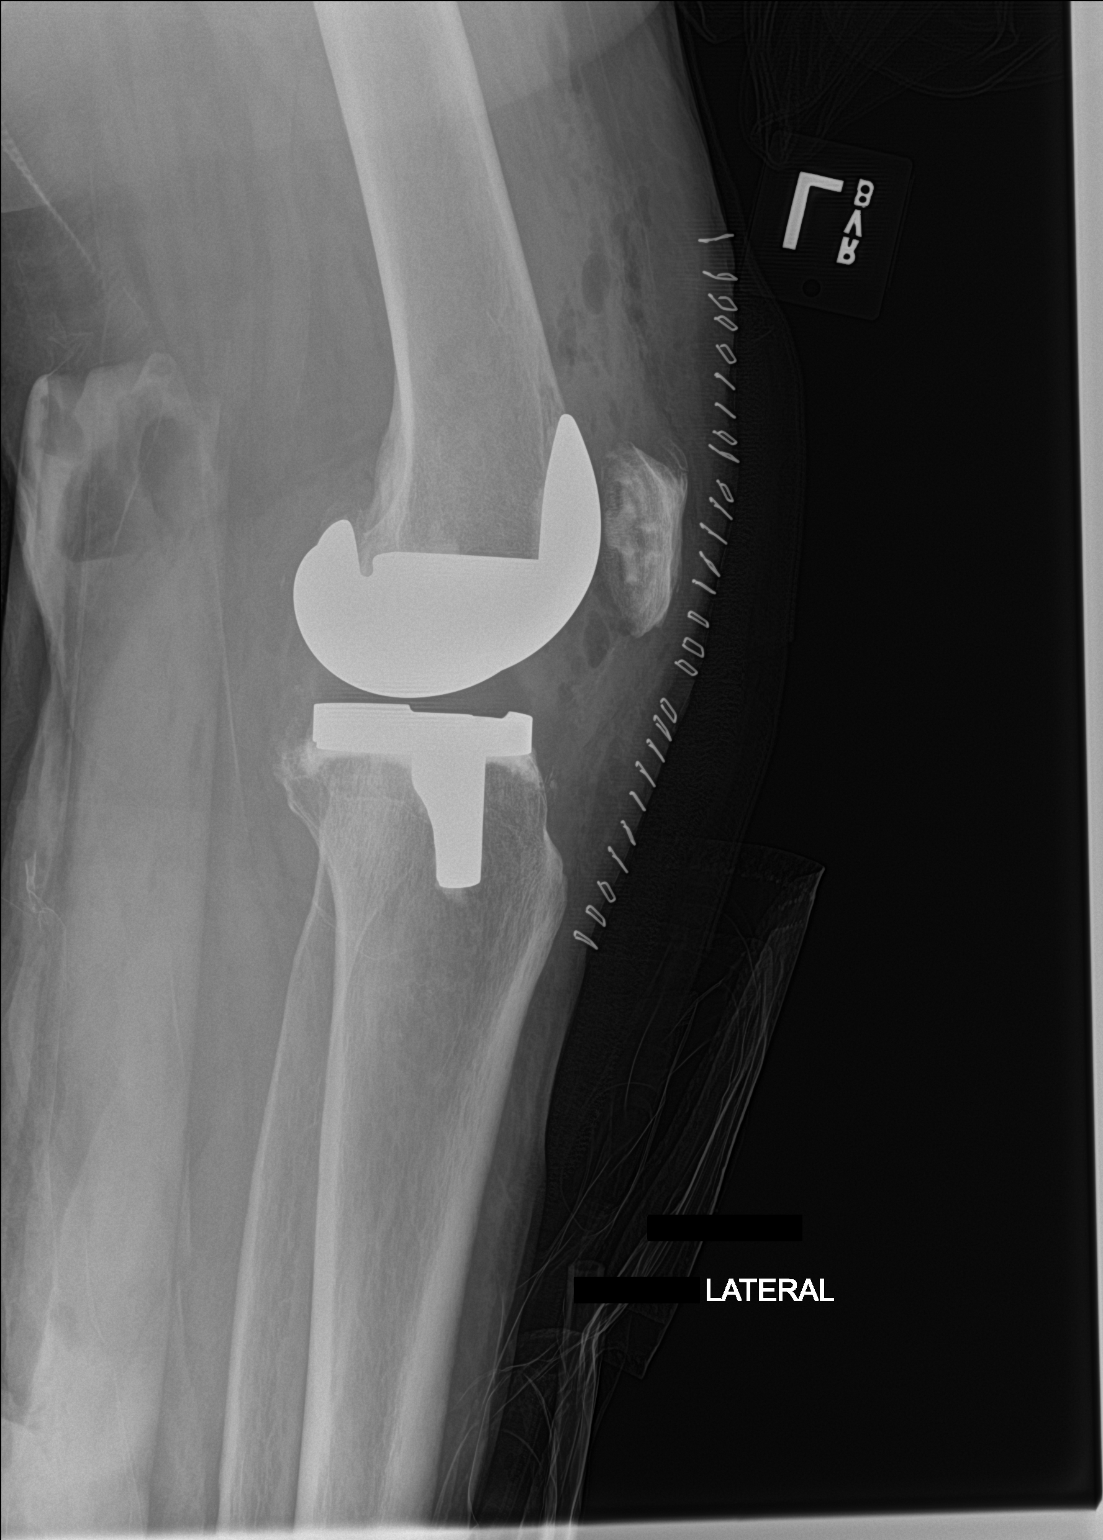

[2 of 2 positions shown; findings below may reference images not displayed]

FINDINGS: Total left knee replacement.  Hardware intact.  Anatomic alignment.
IMPRESSION: Total left knee replacement with anatomic alignment.

## 2020-09-03 ENCOUNTER — Other Ambulatory Visit (HOSPITAL_COMMUNITY): Payer: Self-pay

## 2020-09-03 MED FILL — Methotrexate Soln PF Auto-Injector 25 MG/0.4ML: SUBCUTANEOUS | 28 days supply | Qty: 1.6 | Fill #0 | Status: AC

## 2020-09-04 ENCOUNTER — Other Ambulatory Visit (HOSPITAL_COMMUNITY): Payer: Self-pay

## 2020-09-05 ENCOUNTER — Telehealth: Payer: Self-pay | Admitting: Pharmacy Technician

## 2020-09-05 NOTE — Telephone Encounter (Signed)
Received fax form [facility] [employer] that it is time for patient to re-enroll into their [employer] program for Orencia assistance due to insurance denial.  Filled out new form and will send to office for provider signature.

## 2020-09-06 NOTE — Telephone Encounter (Signed)
Faxed re-enrollment application to [facility]. Awaiting response.  Fax# [phone] Phone# [phone]

## 2020-09-08 ENCOUNTER — Other Ambulatory Visit (HOSPITAL_COMMUNITY): Payer: Self-pay

## 2020-09-13 DIAGNOSIS — F4320 Adjustment disorder, unspecified: Secondary | ICD-10-CM | POA: Diagnosis not present

## 2020-09-13 NOTE — Telephone Encounter (Signed)
Called Orencia to check status of patient's re-enrollment. Rep advised office will need to attempt a new PA and appeal for Orencia.If plan cancels PA for a duplicate we can send that to [place] program and will still need to attempt a new appeal.   Submitted a Prior Authorization request to [facility] for [facility] via Fax. Will update once we receive a response.

## 2020-09-18 NOTE — Telephone Encounter (Signed)
Orencia appeal letter drafted to submit for [facility]  Phone: [phone] Fax: [phone]  Chesley Mires, PharmD, MPH Clinical Pharmacist (Rheumatology and Pulmonology)

## 2020-09-18 NOTE — Telephone Encounter (Signed)
Faxed Denial and Appeal to [facility] program. Will follow up.  Phone# [phone]

## 2020-09-25 ENCOUNTER — Encounter: Payer: Self-pay | Admitting: Rheumatology

## 2020-09-25 NOTE — Telephone Encounter (Signed)
Received denial letter from [facility] for Orencia after initial review by provider. Submitted appeal packet for formal appeal review.  Fax: [phone] Phone: [phone] PA Case: [id number]  Also faxed this appeal and denial to [facility] Fax: [phone] Phone [phone]  Chesley Mires, PharmD, MPH Clinical Pharmacist (Rheumatology and Pulmonology)

## 2020-09-27 ENCOUNTER — Other Ambulatory Visit: Payer: Self-pay | Admitting: Physician Assistant

## 2020-09-27 DIAGNOSIS — M0579 Rheumatoid arthritis with rheumatoid factor of multiple sites without organ or systems involvement: Secondary | ICD-10-CM

## 2020-09-27 MED ORDER — OTREXUP 25 MG/0.4ML ~~LOC~~ SOAJ
SUBCUTANEOUS | 0 refills | Status: DC
  Filled 2020-09-27: qty 4.8, 28d supply, fill #0
  Filled 2020-10-03: qty 3.2, 56d supply, fill #0
  Filled 2020-11-21 – 2020-12-04 (×3): qty 1.6, 28d supply, fill #1

## 2020-09-27 NOTE — Telephone Encounter (Signed)
Received letter from [facility] that patient's appeal was forward to corporate pharmacy department and is still in process  Appeal request #: [id number]  Chesley Mires, PharmD, MPH Clinical Pharmacist (Rheumatology and Pulmonology)

## 2020-09-27 NOTE — Telephone Encounter (Signed)
Next Visit: 11/01/2020  Last Visit: 08/03/2020  Last Fill: 06/27/2020   DX:   Rheumatoid arthritis involving multiple sites with positive rheumatoid factor   Current Dose per office note 08/03/2020, methotrexate 25 mg sq injections  Labs: 06/26/2020, Creatinine is borderline low. GFR is WNL. Rest of CMP WNL.  RDW is slightly elevated. Rest of CBC WNL.   Patient will come in next week for labs.  Okay to refill MTX?

## 2020-09-28 ENCOUNTER — Other Ambulatory Visit (HOSPITAL_COMMUNITY): Payer: Self-pay

## 2020-09-28 ENCOUNTER — Other Ambulatory Visit: Payer: Self-pay | Admitting: Radiology

## 2020-10-03 ENCOUNTER — Other Ambulatory Visit (HOSPITAL_COMMUNITY): Payer: Self-pay

## 2020-10-03 ENCOUNTER — Other Ambulatory Visit: Payer: Self-pay | Admitting: *Deleted

## 2020-10-03 DIAGNOSIS — Z79899 Other long term (current) drug therapy: Secondary | ICD-10-CM

## 2020-10-08 NOTE — Telephone Encounter (Signed)
Received fax from [facility] stating that the third-party authorization form is invalid. "This form must be service specific, date specific, and signed after [employer] issues a denial. A third-party authorization form was mailed to the member with instructions to sign, date, and return to via mail or fax".  [employer] [address] [address] [place], [place] [address] Fax# [phone]  I have printed a copy of this letter if needed.

## 2020-10-09 ENCOUNTER — Other Ambulatory Visit: Payer: Self-pay | Admitting: *Deleted

## 2020-10-09 DIAGNOSIS — M0579 Rheumatoid arthritis with rheumatoid factor of multiple sites without organ or systems involvement: Secondary | ICD-10-CM

## 2020-10-09 DIAGNOSIS — Z79899 Other long term (current) drug therapy: Secondary | ICD-10-CM

## 2020-10-09 NOTE — Telephone Encounter (Addendum)
Patient stopped by clinic for labs - we discussed that she should have received letter in mail for her to sign for [place] to re-submit appeal on her behalf. She will check her letters at home to see if she has form.  Called [employer] for this form since we do not have it - requested form be re-faxed to clinic  She is going on vacation 10/17/20 so may not return by then. Will f/u  Chesley Mires, PharmD, MPH Clinical Pharmacist (Rheumatology and Pulmonology)

## 2020-10-12 ENCOUNTER — Other Ambulatory Visit: Payer: Self-pay

## 2020-10-12 ENCOUNTER — Encounter: Payer: Self-pay | Admitting: Rheumatology

## 2020-10-12 DIAGNOSIS — Z79899 Other long term (current) drug therapy: Secondary | ICD-10-CM

## 2020-10-12 DIAGNOSIS — Z111 Encounter for screening for respiratory tuberculosis: Secondary | ICD-10-CM

## 2020-10-12 NOTE — Telephone Encounter (Signed)
Medication Samples have been provided to the patient.  Drug name: Environmental education officer Strength: 125 mg Qty: 2 boxes LOT:  [id number] Exp Date:  02/2022 LOT: [id number] Exp.Date: 11/2021  Dosing instructions: inject 125 mg into the skin once weekly.

## 2020-10-12 NOTE — Telephone Encounter (Signed)
Per Orencia [employer] program rep, patient has order on file and ready to go. MyChart message sent to patient to call to schedule shipment  Phone: [phone]  Chesley Mires, PharmD, MPH Clinical Pharmacist (Rheumatology and Pulmonology)

## 2020-10-13 NOTE — Progress Notes (Signed)
CBC and CMP are within normal limits.

## 2020-10-15 LAB — COMPLETE METABOLIC PANEL WITH GFR
AG Ratio: 1.2 (calc) (ref 1.0–2.5)
ALT: 15 U/L (ref 6–29)
AST: 16 U/L (ref 10–35)
Albumin: 3.8 g/dL (ref 3.6–5.1)
Alkaline phosphatase (APISO): 79 U/L (ref 37–153)
BUN: 18 mg/dL (ref 7–25)
CO2: 28 mmol/L (ref 20–32)
Calcium: 9.4 mg/dL (ref 8.6–10.4)
Chloride: 104 mmol/L (ref 98–110)
Creat: 0.69 mg/dL (ref 0.50–1.05)
GFR, Est African American: 114 mL/min/{1.73_m2} (ref >=60)
GFR, Est Non African American: 98 mL/min/{1.73_m2} (ref >=60)
Globulin: 3.2 g/dL (calc) (ref 1.9–3.7)
Glucose, Bld: 83 mg/dL (ref 65–99)
Potassium: 4.4 mmol/L (ref 3.5–5.3)
Sodium: 140 mmol/L (ref 135–146)
Total Bilirubin: 0.2 mg/dL (ref 0.2–1.2)
Total Protein: 7 g/dL (ref 6.1–8.1)

## 2020-10-15 LAB — QUANTIFERON-TB GOLD PLUS
Mitogen-NIL: 1.99 IU/mL
NIL: 0.04 IU/mL
QuantiFERON-TB Gold Plus: NEGATIVE
TB1-NIL: 0.01 IU/mL
TB2-NIL: 0 IU/mL

## 2020-10-15 LAB — CBC WITH DIFFERENTIAL/PLATELET
Absolute Monocytes: 837 cells/uL (ref 200–950)
Basophils Absolute: 37 cells/uL (ref 0–200)
Basophils Relative: 0.4 %
Eosinophils Absolute: 212 cells/uL (ref 15–500)
Eosinophils Relative: 2.3 %
HCT: 40.3 % (ref 35.0–45.0)
Hemoglobin: 12.8 g/dL (ref 11.7–15.5)
Lymphs Abs: 2144 cells/uL (ref 850–3900)
MCH: 27.7 pg (ref 27.0–33.0)
MCHC: 31.8 g/dL — ABNORMAL LOW (ref 32.0–36.0)
MCV: 87.2 fL (ref 80.0–100.0)
MPV: 9.8 fL (ref 7.5–12.5)
Monocytes Relative: 9.1 %
Neutro Abs: 5971 cells/uL (ref 1500–7800)
Neutrophils Relative %: 64.9 %
Platelets: 322 10*3/uL (ref 140–400)
RBC: 4.62 10*6/uL (ref 3.80–5.10)
RDW: 16.4 % — ABNORMAL HIGH (ref 11.0–15.0)
Total Lymphocyte: 23.3 %
WBC: 9.2 10*3/uL (ref 3.8–10.8)

## 2020-10-16 ENCOUNTER — Other Ambulatory Visit: Payer: Self-pay | Admitting: *Deleted

## 2020-10-16 DIAGNOSIS — M0579 Rheumatoid arthritis with rheumatoid factor of multiple sites without organ or systems involvement: Secondary | ICD-10-CM

## 2020-10-16 MED ORDER — ORENCIA CLICKJECT 125 MG/ML ~~LOC~~ SOAJ: 125.0000 mg | SUBCUTANEOUS | 0 refills | Status: DC

## 2020-10-16 NOTE — Telephone Encounter (Signed)
Next Visit: 11/01/2020  Last Visit: 08/03/2020  Last Fill: 07/19/2019  DX:  Rheumatoid arthritis involving multiple sites with positive rheumatoid factor   Current Dose per office note 08/03/2020, Orencia 125 mg sq injections once weekly  Labs: 10/12/2020, CBC and CMP are within normal limits.  TB Gold:  10/12/2020, negative  Okay to refill Orencia [employer]?

## 2020-10-18 NOTE — Progress Notes (Deleted)
Office Visit Note  Patient: Donna Lopez             Date of Birth: 01/25/1966           MRN: [id number]             PCP: Donato Schultz, DO Referring: Donato Schultz, * Visit Date: 11/01/2020 Occupation: @GUAROCC @  Subjective:  No chief complaint on file.   History of Present Illness: Donna Lopez is a 55 y.o. female ***   Activities of Daily Living:  Patient reports morning stiffness for *** {minute/hour:19697}.   Patient {ACTIONS;DENIES/REPORTS:21021675::"Denies"} nocturnal pain.  Difficulty dressing/grooming: {ACTIONS;DENIES/REPORTS:21021675::"Denies"} Difficulty climbing stairs: {ACTIONS;DENIES/REPORTS:21021675::"Denies"} Difficulty getting out of chair: {ACTIONS;DENIES/REPORTS:21021675::"Denies"} Difficulty using hands for taps, buttons, cutlery, and/or writing: {ACTIONS;DENIES/REPORTS:21021675::"Denies"}  No Rheumatology ROS completed.   PMFS History:  Patient Active Problem List   Diagnosis Date Noted  . Unilateral primary osteoarthritis, left knee 12/17/2018  . Status post total left knee replacement 12/17/2018  . Primary osteoarthritis of left knee 11/23/2018  . Carpal tunnel syndrome, left upper limb 11/23/2018  . BMI 40.0-44.9, adult (HCC) 07/12/2018  . Primary osteoarthritis of both knees 12/24/2016  . 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 DDD (degenerative disc disease), lumbar 11/17/2016  . Vitamin D deficiency 11/17/2016  . History of rosacea/ History of acne 11/17/2016  . High risk medications (not anticoagulants) long-term use 06/25/2016  . History of degenerative disc disease 06/25/2016  . Severe obesity (BMI >= 40) (HCC) 06/13/2014  . Left-sided thoracic back pain 07/19/2013  . PALPITATIONS 10/17/2009  . UTI 05/24/2009  . Rheumatoid arthritis (HCC) 05/24/2009  . Hair loss 02/22/2009  . VAGINAL BLEEDING 10/05/2008  . Carrier of group B Streptococcus 07/28/2007  . GERD 12/28/2006  . CARDIAC MURMUR, HX OF  12/28/2006  . HIATAL HERNIA, HX OF 12/28/2006    Past Medical History:  Diagnosis Date  . Allergy   . Arthritis   . Carpal tunnel syndrome 10/2018  . DDD (degenerative disc disease), lumbar   . DDD (degenerative disc disease), thoracic   . Family history of adverse reaction to anesthesia    Mothers sister never woke up from surgery  . GERD (gastroesophageal reflux disease)   . Grade I diastolic dysfunction 2013  . Heart murmur   . Heart palpitations    History of  . History of hiatal hernia   . OA (osteoarthritis)   . Obese   . Osteoporosis   . Rheumatoid arthritis (HCC)   . 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 Vitamin D deficiency     Family History  Problem Relation Age of Onset  . Uterine cancer Mother   . Diabetes Father   . Kidney cancer Father   . Colon cancer Sister 67  . Rectal cancer Sister   . Colon cancer Paternal Grandfather   . Colon polyps Brother   . Cancer Sister        breast   . Healthy Daughter   . Esophageal cancer Neg Hx   . Stomach cancer Neg Hx    Past Surgical History:  Procedure Laterality Date  . COLONOSCOPY  2014   DB- normal   . KNEE ARTHROPLASTY    . 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   Procedure: LEFT TOTAL KNEE ARTHROPLASTY;  Surgeon: 12/19/2018, MD;  Location: [facility];  Service: Orthopedics;  Laterality: Left;  . UPPER GASTROINTESTINAL ENDOSCOPY     Social History   Social History Narrative   Exercise--- sometimes  walks 1x a week   Immunization History  Administered Date(s) Administered  . Influenza Whole 02/08/2009  . Influenza,inj,Quad PF,6+ Mos 03/09/2013, 06/13/2014, 06/16/2016, 02/14/2019, 03/30/2020  . PFIZER(Purple Top)SARS-COV-2 Vaccination 08/26/2019, 09/27/2019, 05/30/2020  . Td 09/29/2001  . Tdap 06/13/2014     Objective: Vital Signs: LMP 08/18/2018    Physical Exam   Musculoskeletal Exam: ***  CDAI Exam: CDAI Score: -- Patient Global: --; Provider Global: -- Swollen: --; Tender: -- Joint Exam  11/01/2020   No joint exam has been documented for this visit   There is currently no information documented on the homunculus. Go to the Rheumatology activity and complete the homunculus joint exam.  Investigation: No additional findings.  Imaging: No results found.  Recent Labs: Lab Results  Component Value Date   WBC 9.2 10/12/2020   HGB 12.8 10/12/2020   PLT 322 10/12/2020   NA 140 10/12/2020   K 4.4 10/12/2020   CL 104 10/12/2020   CO2 28 10/12/2020   GLUCOSE 83 10/12/2020   BUN 18 10/12/2020   CREATININE 0.69 10/12/2020   BILITOT 0.2 10/12/2020   ALKPHOS 96 03/30/2020   AST 16 10/12/2020   ALT 15 10/12/2020   PROT 7.0 10/12/2020   ALBUMIN 3.8 03/30/2020   CALCIUM 9.4 10/12/2020   GFRAA 114 10/12/2020   QFTBGOLDPLUS NEGATIVE 10/12/2020    Speciality Comments: Humira- quit working Simponi- inadequate response Orencia-since 07/2019 Reclast start 07/18/20  Procedures:  No procedures performed Allergies: Penicillins   Assessment / Plan:     Visit Diagnoses: Rheumatoid arthritis involving multiple sites with positive rheumatoid factor (HCC)  High risk medications (not anticoagulants) long-term use  Primary osteoarthritis of right knee  S/P total knee arthroplasty, left  DDD (degenerative disc disease), thoracic  DDD (degenerative disc disease), lumbar  Age-related osteoporosis without current pathological fracture  History of vitamin D deficiency  History of hiatal hernia  History of rosacea/ History of acne  History of gastroesophageal reflux (GERD)  BMI 40.0-44.9, adult (HCC)  Orders: No orders of the defined types were placed in this encounter.  No orders of the defined types were placed in this encounter.   Face-to-face time spent with patient was *** minutes. Greater than 50% of time was spent in counseling and coordination of care.  Follow-Up Instructions: No follow-ups on file.   Gearldine Bienenstock, PA-C  Note - This record has been  created using Dragon software.  Chart creation errors have been sought, but may not always  have been located. Such creation errors do not reflect on  the standard of medical care.

## 2020-10-31 ENCOUNTER — Other Ambulatory Visit (HOSPITAL_COMMUNITY): Payer: Self-pay

## 2020-10-31 NOTE — Progress Notes (Addendum)
Office Visit Note  Patient: Donna Lopez             Date of Birth: 1965-10-06           MRN: [id number]             PCP: Donato Schultz, DO Referring: Donato Schultz, * Visit Date: 11/14/2020 Occupation: @  Subjective:  Medication monitoring   History of Present Illness: Donna Lopez is a 55 y.o. female with history of seropositive rheumatoid arthritis, osteoarthritis, DDD, and osteoporosis.  She is on orencia 125 mg sq injections once weekly, methotrexate 25 mg sq injections once weekly, and folic acid 2 mg by mouth daily.  She denies any recent flares.  Her morning stiffness continues to last about 30 minutes daily.  She takes aleve sparingly for pain relief.  She states her left knee replacement is doing well.   She denies any increased lower back pain at this time.  She states she recently had a cold and was experiencing congestion. She was not evaluated by her PCP and did not hold orencia or MTX.  She has some residual congestion especially in her left ear.  She denies any fevers or other associated symptoms. She received her first reclast infusion on 07/18/20.  She experienced some increased fatigue the following day but otherwise did not have any side effects.  She has continued to take a calcium and vitamin D supplement as recommended.  She denies any falls or fractures.     Activities of Daily Living:  Patient reports morning stiffness for 30 minutes.   Patient Denies nocturnal pain.  Difficulty dressing/grooming: Denies Difficulty climbing stairs: Denies Difficulty getting out of chair: Denies Difficulty using hands for taps, buttons, cutlery, and/or writing: Reports  Review of Systems  Constitutional:  Positive for fatigue.  HENT:  Negative for mouth sores, mouth dryness and nose dryness.   Eyes:  Negative for pain, itching and dryness.  Respiratory:  Negative for shortness of breath and difficulty breathing.   Cardiovascular:  Negative for chest  pain and palpitations.  Gastrointestinal:  Negative for blood in stool, constipation and diarrhea.  Endocrine: Negative for increased urination.  Genitourinary:  Negative for difficulty urinating.  Musculoskeletal:  Positive for joint pain, joint pain, joint swelling and morning stiffness. Negative for myalgias, muscle tenderness and myalgias.  Skin:  Negative for color change, rash and redness.  Allergic/Immunologic: Negative for susceptible to infections.  Neurological:  Positive for dizziness. Negative for numbness, headaches, memory loss and weakness.  Hematological:  Positive for bruising/bleeding tendency.  Psychiatric/Behavioral:  Negative for confusion.    PMFS History:  Patient Active Problem List   Diagnosis Date Noted   Unilateral primary osteoarthritis, left knee 12/17/2018   Status post total left knee replacement 12/17/2018   Primary osteoarthritis of left knee 11/23/2018   Carpal tunnel syndrome, left upper limb 11/23/2018   BMI 40.0-44.9, adult (HCC) 07/12/2018   Primary osteoarthritis of both knees 12/24/2016   DDD (degenerative disc disease), thoracic 11/17/2016   DDD (degenerative disc disease), lumbar 11/17/2016   Vitamin D deficiency 11/17/2016   History of rosacea/ History of acne 11/17/2016   High risk medications (not anticoagulants) long-term use 06/25/2016   History of degenerative disc disease 06/25/2016   Severe obesity (BMI >= 40) (HCC) 06/13/2014   Left-sided thoracic back pain 07/19/2013   PALPITATIONS 10/17/2009   UTI 05/24/2009   Rheumatoid arthritis (HCC) 05/24/2009   Hair loss 02/22/2009   VAGINAL  BLEEDING 10/05/2008   Carrier of group B Streptococcus 07/28/2007   GERD 12/28/2006   CARDIAC MURMUR, HX OF 12/28/2006   HIATAL HERNIA, HX OF 12/28/2006    Past Medical History:  Diagnosis Date   Allergy    Arthritis    Carpal tunnel syndrome 10/2018   DDD (degenerative disc disease), lumbar    DDD (degenerative disc disease), thoracic     Family history of adverse reaction to anesthesia    Mothers sister never woke up from surgery   GERD (gastroesophageal reflux disease)    Grade I diastolic dysfunction 2013   Heart murmur    Heart palpitations    History of   History of hiatal hernia    OA (osteoarthritis)    Obese    Osteoporosis    Rheumatoid arthritis (HCC)    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    Vitamin D deficiency     Family History  Problem Relation Age of Onset   Uterine cancer Mother    Diabetes Father    Kidney cancer Father    Colon cancer Sister 34   Rectal cancer Sister    Colon cancer Paternal Grandfather    Colon polyps Brother    Cancer Sister        breast    Healthy Daughter    Esophageal cancer Neg Hx    Stomach cancer Neg Hx    Past Surgical History:  Procedure Laterality Date   COLONOSCOPY  2014   DB- normal    KNEE ARTHROPLASTY     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   Procedure: LEFT TOTAL KNEE ARTHROPLASTY;  Surgeon: Kathryne Hitch, MD;  Location: [facility];  Service: Orthopedics;  Laterality: Left;   UPPER GASTROINTESTINAL ENDOSCOPY     Social History   Social History Narrative   Exercise--- sometimes walks 1x a week   Immunization History  Administered Date(s) Administered   Influenza Whole 02/08/2009   Influenza,inj,Quad PF,6+ Mos 03/09/2013, 06/13/2014, 06/16/2016, 02/14/2019, 03/30/2020   PFIZER(Purple Top)SARS-COV-2 Vaccination 08/26/2019, 09/27/2019, 05/30/2020   Td 09/29/2001   Tdap 06/13/2014     Objective: Vital Signs: BP 126/84 (BP Location: Left Arm, Patient Position: Sitting, Cuff Size: Normal)   Pulse 94   Resp 17   Ht 5\' 2"  (1.575 m)   Wt 229 lb 12.8 oz (104.2 kg)   LMP 08/18/2018   BMI 42.03 kg/m    Physical Exam Vitals and nursing note reviewed.  Constitutional:      Appearance: She is well-developed.  HENT:     Head: Normocephalic and atraumatic.  Eyes:     Conjunctiva/sclera: Conjunctivae normal.  Pulmonary:     Effort: Pulmonary effort is  normal.  Abdominal:     Palpations: Abdomen is soft.  Musculoskeletal:     Cervical back: Normal range of motion.  Skin:    General: Skin is warm and dry.     Capillary Refill: Capillary refill takes less than 2 seconds.  Neurological:     Mental Status: She is alert and oriented to person, place, and time.  Psychiatric:        Behavior: Behavior normal.     Musculoskeletal Exam: C-spine has good range of motion with no discomfort at this time.  Postural thoracic kyphosis noted.  Elbow joints and wrist joints have good range of motion with no discomfort.  Synovial thickening over MCP and PIP joints.  Synovial thickening and limited range of motion of both wrist joints noted.  She has synovitis over right first  and third MCP joints, right third PIP joint, and left second MCP joint.  Hip joints have good ROM with no discomfort.  Left knee replacement has good ROM.  Right knee has good ROM with no warmth or effusion.  Ankle joints have good ROM with no tenderness or inflammation.    CDAI Exam: CDAI Score: 5.6  Patient Global: 3 mm; Provider Global: 3 mm Swollen: 4 ; Tender: 1  Joint Exam 11/14/2020      Right  Left  MCP 1  Swollen      MCP 2     Swollen   MCP 3  Swollen      PIP 3  Swollen Tender        Investigation: No additional findings.  Imaging: No results found.  Recent Labs: Lab Results  Component Value Date   WBC 9.2 10/12/2020   HGB 12.8 10/12/2020   PLT 322 10/12/2020   NA 140 10/12/2020   K 4.4 10/12/2020   CL 104 10/12/2020   CO2 28 10/12/2020   GLUCOSE 83 10/12/2020   BUN 18 10/12/2020   CREATININE 0.69 10/12/2020   BILITOT 0.2 10/12/2020   ALKPHOS 96 03/30/2020   AST 16 10/12/2020   ALT 15 10/12/2020   PROT 7.0 10/12/2020   ALBUMIN 3.8 03/30/2020   CALCIUM 9.4 10/12/2020   GFRAA 114 10/12/2020   QFTBGOLDPLUS NEGATIVE 10/12/2020    Speciality Comments: Humira- quit working Simponi- inadequate response Orencia-since 07/2019 Reclast start  07/18/20  Procedures:  No procedures performed Allergies: Penicillins    Assessment / Plan:     Visit Diagnoses: Rheumatoid arthritis involving multiple sites with positive rheumatoid factor (HCC) - Positive rheumatoid factor, positive anti-CCP, erosive disease. She previously had an inadequate response to Humira and Simponi: She has ongoing synovitis in multiple joints as described above.  On overall she feels that Orencia and Otrexup have been effective at managing her rheumatoid arthritis.  She has been having less frequent and less severe flares.  At her last office visit there was discussion of switching to [facility] or Rinvoq but she does not want to make any medication changes at this time.  X-rays of both hands and feet were obtained today which were consistent with erosive rheumatoid arthritis and radiographic progression when compared to images from 2019.  I attempted to call the patient to review x-ray results and to get her opinion on proceeding with applying for rinvoq.  I think she will benefit from changing to rinvoq..- Plan: XR Hand 2 View Left, XR Hand 2 View Right, XR Foot 2 Views Left, XR Foot 2 Views Right  High risk medications (not anticoagulants) long-term use - Orencia 125 mg sq injections once weekly (started on 07/19/19), methotrexate 25 mg sq injections, and folic acid 2 mg po daily. CBC and CMP updated on 10/12/20. She will be due to update lab work in August and every 3 months.  Standing orders are in place. TB gold negative on 10/12/20.   She recently was experiencing congestion and left ear pain.  She was not evaluated by her PCP so she was not treated with any antibiotics.  She did not hold Orencia or Otrexup during that time.  She remained afebrile.  She is still having some mild congestion and was advised to hold Orencia and Otrexup until her symptoms have completely resolved.  She was advised to schedule an appointment with her PCP if she continues to have persistent  congestion or develops any new or worsening symptoms.  I stressed the importance of holding Orencia and Otrexup anytime she develops signs or symptoms of an infection and to resume only once the infection has completely cleared. She has received 2 Pfizer COVID-19 vaccine doses as well as 1 booster dose.  Primary osteoarthritis of right knee: She has good range of motion of the right knee joint with no discomfort.  No warmth or effusion was noted.  S/P total knee arthroplasty, left: Doing well.  She has good range of motion with warmth but no effusion.  DDD (degenerative disc disease), thoracic: Postural thoracic kyphosis noted.  DDD (degenerative disc disease), lumbar: She has not been experiencing increased discomfort or stiffness in her lower back recently.  She has no symptoms of radiculopathy.  Age-related osteoporosis without current pathological fracture - DEXA on 02/02/20: Left femur neck BMD 0.437 with T-score -3.7.  Repeat DEXA due in 2 years.  She received first dose of Reclast IV on July 18, 2020.  She experienced some fatigue the day after receiving Reclast but otherwise had no side effects.  She has continued to take a calcium and vitamin D supplement on a daily basis as recommended.  She has not had any recent falls or fractures.  History of vitamin D deficiency: She is taking vitamin D 2000 units daily.  Other medical conditions are listed as follows:  History of rosacea/ History of acne  History of hiatal hernia  History of gastroesophageal reflux (GERD)  Orders: Orders Placed This Encounter  Procedures   XR Hand 2 View Left   XR Hand 2 View Right   XR Foot 2 Views Left   XR Foot 2 Views Right   No orders of the defined types were placed in this encounter.    Follow-Up Instructions: Return in about 5 months (around 04/16/2021) for Rheumatoid arthritis, Osteoarthritis.   Gearldine Bienenstock, PA-C  Note - This record has been created using Dragon software.  Chart  creation errors have been sought, but may not always  have been located. Such creation errors do not reflect on  the standard of medical care.

## 2020-11-01 ENCOUNTER — Ambulatory Visit: Payer: BC Managed Care – PPO | Admitting: Physician Assistant

## 2020-11-01 DIAGNOSIS — Z96652 Presence of left artificial knee joint: Secondary | ICD-10-CM

## 2020-11-01 DIAGNOSIS — M5134 Other intervertebral disc degeneration, thoracic region: Secondary | ICD-10-CM

## 2020-11-01 DIAGNOSIS — Z872 Personal history of diseases of the skin and subcutaneous tissue: Secondary | ICD-10-CM

## 2020-11-01 DIAGNOSIS — M5136 Other intervertebral disc degeneration, lumbar region: Secondary | ICD-10-CM

## 2020-11-01 DIAGNOSIS — Z79899 Other long term (current) drug therapy: Secondary | ICD-10-CM

## 2020-11-01 DIAGNOSIS — Z8719 Personal history of other diseases of the digestive system: Secondary | ICD-10-CM

## 2020-11-01 DIAGNOSIS — M810 Age-related osteoporosis without current pathological fracture: Secondary | ICD-10-CM

## 2020-11-01 DIAGNOSIS — Z6841 Body Mass Index (BMI) 40.0 and over, adult: Secondary | ICD-10-CM

## 2020-11-01 DIAGNOSIS — M0579 Rheumatoid arthritis with rheumatoid factor of multiple sites without organ or systems involvement: Secondary | ICD-10-CM

## 2020-11-01 DIAGNOSIS — M1711 Unilateral primary osteoarthritis, right knee: Secondary | ICD-10-CM

## 2020-11-01 DIAGNOSIS — Z8639 Personal history of other endocrine, nutritional and metabolic disease: Secondary | ICD-10-CM

## 2020-11-08 NOTE — Telephone Encounter (Signed)
Left VM for patient regarding Orencia appeal forms from [facility] requesting she return call or bring form with her to office appt with Sherron Ales, PA-C on 11/14/20

## 2020-11-14 ENCOUNTER — Ambulatory Visit: Payer: Self-pay

## 2020-11-14 ENCOUNTER — Encounter: Payer: Self-pay | Admitting: Physician Assistant

## 2020-11-14 ENCOUNTER — Ambulatory Visit (INDEPENDENT_AMBULATORY_CARE_PROVIDER_SITE_OTHER): Payer: BC Managed Care – PPO | Admitting: Physician Assistant

## 2020-11-14 ENCOUNTER — Other Ambulatory Visit: Payer: Self-pay

## 2020-11-14 ENCOUNTER — Telehealth: Payer: Self-pay | Admitting: Physician Assistant

## 2020-11-14 VITALS — BP 126/84 | HR 94 | Resp 17 | Ht 62.0 in | Wt 229.8 lb

## 2020-11-14 DIAGNOSIS — M5134 Other intervertebral disc degeneration, thoracic region: Secondary | ICD-10-CM

## 2020-11-14 DIAGNOSIS — M810 Age-related osteoporosis without current pathological fracture: Secondary | ICD-10-CM

## 2020-11-14 DIAGNOSIS — Z872 Personal history of diseases of the skin and subcutaneous tissue: Secondary | ICD-10-CM

## 2020-11-14 DIAGNOSIS — Z96652 Presence of left artificial knee joint: Secondary | ICD-10-CM

## 2020-11-14 DIAGNOSIS — M51369 Other intervertebral disc degeneration, lumbar region without mention of lumbar back pain or lower extremity pain: Secondary | ICD-10-CM

## 2020-11-14 DIAGNOSIS — M0579 Rheumatoid arthritis with rheumatoid factor of multiple sites without organ or systems involvement: Secondary | ICD-10-CM | POA: Diagnosis not present

## 2020-11-14 DIAGNOSIS — Z8719 Personal history of other diseases of the digestive system: Secondary | ICD-10-CM

## 2020-11-14 DIAGNOSIS — M1711 Unilateral primary osteoarthritis, right knee: Secondary | ICD-10-CM

## 2020-11-14 DIAGNOSIS — Z79899 Other long term (current) drug therapy: Secondary | ICD-10-CM | POA: Diagnosis not present

## 2020-11-14 DIAGNOSIS — M5136 Other intervertebral disc degeneration, lumbar region: Secondary | ICD-10-CM

## 2020-11-14 DIAGNOSIS — Z8639 Personal history of other endocrine, nutritional and metabolic disease: Secondary | ICD-10-CM

## 2020-11-14 NOTE — Telephone Encounter (Addendum)
I attempted to call patient to review x-ray results obtained today.  X-rays were consistent with erosive rheumatoid arthritis and revealed progression when compared to images from 2019.  I spoke with Dr. Corliss Skains and she recommends proceeding with Rinvoq if the patient is in agreement.  Consent will need to be obtained prior to starting the patient on Rinvoq.  We will also have to apply for Rinvoq through her insurance.  Please notify Dr. Corliss Skains or myself as well as Chesley Mires if she would like to change from [place] to rinvoq.   Sherron Ales, PA-C

## 2020-11-14 NOTE — Patient Instructions (Signed)
Standing Labs We placed an order today for your standing lab work.   Please have your standing labs drawn in August and every 3 months   If possible, please have your labs drawn 2 weeks prior to your appointment so that the provider can discuss your results at your appointment.  Please note that you may see your imaging and lab results in MyChart before we have reviewed them. We may be awaiting multiple results to interpret others before contacting you. Please allow our office up to 72 hours to thoroughly review all of the results before contacting the office for clarification of your results.  We have open lab daily: Monday through Thursday from 1:30-4:30 PM and Friday from 1:30-4:00 PM at the office of Dr. Shaili Deveshwar, [facility].   Please be advised, all patients with office appointments requiring lab work will take precedent over walk-in lab work.  If possible, please come for your lab work on Monday and Friday afternoons, as you may experience shorter wait times. The office is located at [address], [address], [place], [place] [address] No appointment is necessary.   Labs are drawn by [facility]. Please bring your co-pay at the time of your lab draw.  You may receive a bill from [facility] for your lab work.  If you wish to have your labs drawn at another location, please call the office 24 hours in advance to send orders.  If you have any questions regarding directions or hours of operation,  please call [phone].   As a reminder, please drink plenty of water prior to coming for your lab work. Thanks!  

## 2020-11-14 NOTE — Telephone Encounter (Signed)
Patient has OV today with Sherron Ales, PA-C, and stated that her printer was broken and has been unable to complete appeal forms for [employer]. She will plan to print these forms at her office printer, complete and return to the clinic

## 2020-11-15 NOTE — Telephone Encounter (Signed)
Advised patient of message below from [place]. Patient is in agreement to change to Rinvoq.   Pharmacy team-- Please apply for Rinvoq, thanks!

## 2020-11-15 NOTE — Telephone Encounter (Signed)
Submitted a Prior Authorization request to [employer] for RINVOQ via CoverMyMeds. Will update once we receive a response.     Key: [id number] 

## 2020-11-16 NOTE — Telephone Encounter (Signed)
Submitted a Prior Authorization request to [facility] for RINVOQ via CoverMyMeds. Will update once we receive a response.     Key: [id number]

## 2020-11-20 ENCOUNTER — Other Ambulatory Visit (HOSPITAL_COMMUNITY): Payer: Self-pay

## 2020-11-20 NOTE — Telephone Encounter (Signed)
Received notification from [facility] regarding a prior authorization for [facility]. Authorization has been APPROVED from 11/16/2020 to 11/15/2021.   Patient can fill through [facility]: [phone]   Authorization # [id number]   Test claim revealed that insurance covers $5,518.99, leaving patient with a copay of $150.00.  Enrolling patient in [employer] Complete portal for additional copay assistance.

## 2020-11-21 ENCOUNTER — Other Ambulatory Visit (HOSPITAL_COMMUNITY): Payer: Self-pay

## 2020-11-21 ENCOUNTER — Telehealth: Payer: Self-pay | Admitting: Pharmacist

## 2020-11-21 DIAGNOSIS — Z79899 Other long term (current) drug therapy: Secondary | ICD-10-CM

## 2020-11-21 MED ORDER — RINVOQ 15 MG PO TB24
15.0000 mg | ORAL_TABLET | Freq: Every day | ORAL | 0 refills | Status: DC
  Filled 2020-11-21 – 2020-11-22 (×2): qty 30, 30d supply, fill #0
  Filled 2020-12-20: qty 30, 30d supply, fill #1
  Filled 2021-01-17: qty 30, 30d supply, fill #2

## 2020-11-21 NOTE — Telephone Encounter (Signed)
Pharmacy Note  Subjective: Patient called today by [facility] pharmacy team for Rinvoq new start. Patient seen by the pharmacist for counseling on Rinvoq for rheumatoid arthritis..  Previous therapy include:Orencia (waning response), Humira (waning response), Simponi (inadequate response).  Decision was made to switch to Rinvoq d/t inadequate RA control with Orencia. She states she will be out of town from 11/26/20 to 11/30/20.  History of diverticulitis:  No  History of MI, stroke, or CV events:  No. Family history of cardiac events pe rher recollection  Objective:  CMP     Component Value Date/Time   NA 140 10/12/2020 1421   K 4.4 10/12/2020 1421   CL 104 10/12/2020 1421   CO2 28 10/12/2020 1421   GLUCOSE 83 10/12/2020 1421   BUN 18 10/12/2020 1421   CREATININE 0.69 10/12/2020 1421   CALCIUM 9.4 10/12/2020 1421   PROT 7.0 10/12/2020 1421   ALBUMIN 3.8 03/30/2020 0846   AST 16 10/12/2020 1421   ALT 15 10/12/2020 1421   ALKPHOS 96 03/30/2020 0846    CBC    Component Value Date/Time   WBC 9.2 10/12/2020 1421   RBC 4.62 10/12/2020 1421   HGB 12.8 10/12/2020 1421   HCT 40.3 10/12/2020 1421   PLT 322 10/12/2020 1421   MCV 87.2 10/12/2020 1421   MCH 27.7 10/12/2020 1421   MCHC 31.8 (L) 10/12/2020 1421   RDW 16.4 (H) 10/12/2020 1421    Baseline Immunosuppressant Therapy Labs TB GOLD Quantiferon TB Gold Latest Ref Rng & Units 10/12/2020  Quantiferon TB Gold Plus NEGATIVE NEGATIVE   Hepatitis Panel Hepatitis Latest Ref Rng & Units 06/30/2019  Hep B Surface Ag NON-REACTI NON-REACTIVE  Hep B IgM NON-REACTI NON-REACTIVE  Hep C Ab NON-REACTI NON-REACTIVE  Hep C Ab NON-REACTI NON-REACTIVE  Hep A IgM NON-REACTI NON-REACTIVE   HIV Lab Results  Component Value Date   HIV NONREACTIVE 06/15/2015   Immunoglobulins Immunoglobulin Electrophoresis Latest Ref Rng & Units 06/30/2019  IgA  47 - 310 mg/dL 037(C)  IgG 488 - 8,916 mg/dL 9,450  IgM 50 - 388 mg/dL 828    SPEP Serum Protein Electrophoresis Latest Ref Rng & Units 10/12/2020  Total Protein 6.1 - 8.1 g/dL 7.0  Albumin 3.8 - 4.8 g/dL -  Alpha-1 0.2 - 0.3 g/dL -  Alpha-2 0.5 - 0.9 g/dL -  Beta Globulin 0.4 - 0.6 g/dL -  Beta 2 0.2 - 0.5 g/dL -  Gamma Globulin 0.8 - 1.7 g/dL -   Lipid Panel Lab Results  Component Value Date   CHOL 191 03/30/2020   HDL 46.60 03/30/2020   LDLCALC 123 (H) 03/30/2020   LDLDIRECT 123.0 03/17/2019   TRIG 109.0 03/30/2020   CHOLHDL 4 03/30/2020     Assessment/Plan:  Counseled patient that Rinvoq is a JAK inhibitor indicated for Rheumatoid Arthritis.  Counseled patient on purpose, proper use, and adverse effects of Rinvoq.    Reviewed the most common adverse effects including infection, diarrhea, headaches.  Also reviewed rare adverse effects such as bowel injury and the need to contact us if they develop stomach pain during treatment. Counseled on the increase risk of venous thrombosis. Counseled about FDA black box warning of MACE (major adverse CV events including cardiovascular death, myocardial infarction, and stroke).  She takes daily low-dose aspirin for primary CV prevention.  Reviewed signs/symptoms of DVT/PE.  Reviewed with patient that there is the possibility of an increased risk of malignancy specifically lung cancer and lymphomas but it is not well  understood if this increased risk is due to the medication or the disease state. Instructed patient that medication should be held for infection and prior to surgery.  Advised patient to avoid live vaccines. Recommend annual influenza, PCV 15 or PCV20 or Pneumovax 23, and Shingrix as indicated.    Advised to get zoster vaccine especially for this class of medications.   Reviewed importance of routine lab monitoring including lipid panel.  Will recheck lipid panel 3 months after starting and annually thereafter. CBC and CMP will be monitored routinely every 3 months. Standing orders placed. Provided patient  with medication education material and answered all questions.  Patient consented to Rinvoq verbally via phone.    Patient dose will be 15 mg daily.  Prescription sent to [facility] with copay card information. Patient advised that pharmacy will mail medication to her home.  Chesley Mires, PharmD, MPH Clinical Pharmacist (Rheumatology and Pulmonology)

## 2020-11-21 NOTE — Telephone Encounter (Signed)
Patient is ready to have a new prescription sent in to [facility].

## 2020-11-21 NOTE — Telephone Encounter (Addendum)
Rinvoq copay card: BIN : [id number] PCN : [id number] ID : [id number] ID : [id number]  Rx sent to [facility] with copay card information. ATC patient to discuss. She is able to start Rinvoq when her next Orencia dose would be due. I wanted to review if she had any questions about Rinvoq prior to starting. Will ATC tomorrow  Chesley Mires, PharmD, MPH Clinical Pharmacist (Rheumatology and Pulmonology)

## 2020-11-21 NOTE — Addendum Note (Signed)
Addended by: Murrell Redden on: 11/21/2020 04:50 PM   Modules accepted: Orders

## 2020-11-22 ENCOUNTER — Other Ambulatory Visit (HOSPITAL_COMMUNITY): Payer: Self-pay

## 2020-11-22 NOTE — Telephone Encounter (Signed)
Reached out to pt regarding her prescriptions, pt stated she would rather pick medications up from pharmacy than have them mailed to her. Informed her that her Rinvoq could be filled and picked up today, but that the Otrexup would need to be ordered. Pt verbalized understanding. Pharmacy informed of pt's preferences. Nothing else needed at this time.

## 2020-11-23 ENCOUNTER — Other Ambulatory Visit (HOSPITAL_COMMUNITY): Payer: Self-pay

## 2020-11-24 ENCOUNTER — Other Ambulatory Visit (HOSPITAL_COMMUNITY): Payer: Self-pay

## 2020-11-26 ENCOUNTER — Other Ambulatory Visit (HOSPITAL_COMMUNITY): Payer: Self-pay

## 2020-12-04 ENCOUNTER — Other Ambulatory Visit (HOSPITAL_COMMUNITY): Payer: Self-pay

## 2020-12-14 ENCOUNTER — Ambulatory Visit: Payer: BC Managed Care – PPO | Admitting: Physician Assistant

## 2020-12-20 ENCOUNTER — Other Ambulatory Visit: Payer: Self-pay | Admitting: Physician Assistant

## 2020-12-20 ENCOUNTER — Other Ambulatory Visit (HOSPITAL_COMMUNITY): Payer: Self-pay

## 2020-12-20 DIAGNOSIS — M0579 Rheumatoid arthritis with rheumatoid factor of multiple sites without organ or systems involvement: Secondary | ICD-10-CM

## 2020-12-20 MED ORDER — OTREXUP 25 MG/0.4ML ~~LOC~~ SOAJ
SUBCUTANEOUS | 0 refills | Status: DC
  Filled 2020-12-20 – 2020-12-31 (×2): qty 1.6, 28d supply, fill #0
  Filled 2021-01-17: qty 1.6, 28d supply, fill #1
  Filled 2021-02-18 – 2021-02-27 (×2): qty 1.6, 28d supply, fill #2

## 2020-12-20 NOTE — Telephone Encounter (Signed)
Next Visit: 04/16/2021  Last Visit: 11/14/2020  Last Fill: 09/27/2020  DX: Rheumatoid arthritis involving multiple sites with positive rheumatoid factor  Current Dose per office note 11/14/2020: methotrexate 25 mg sq injections  Labs: 10/12/2020 CBC and CMP are within normal limits.  Okay to refill Otrexup?

## 2020-12-24 ENCOUNTER — Other Ambulatory Visit (HOSPITAL_COMMUNITY): Payer: Self-pay

## 2020-12-25 ENCOUNTER — Other Ambulatory Visit (HOSPITAL_COMMUNITY): Payer: Self-pay

## 2020-12-31 ENCOUNTER — Other Ambulatory Visit (HOSPITAL_COMMUNITY): Payer: Self-pay

## 2021-01-01 ENCOUNTER — Other Ambulatory Visit (HOSPITAL_COMMUNITY): Payer: Self-pay

## 2021-01-03 ENCOUNTER — Encounter: Payer: Self-pay | Admitting: Family Medicine

## 2021-01-03 DIAGNOSIS — B001 Herpesviral vesicular dermatitis: Secondary | ICD-10-CM

## 2021-01-04 MED ORDER — ACYCLOVIR 5 % EX CREA: 1.0000 "application " | TOPICAL_CREAM | CUTANEOUS | 2 refills | Status: DC

## 2021-01-04 MED ORDER — VALACYCLOVIR HCL 1 G PO TABS: ORAL_TABLET | ORAL | 2 refills | Status: DC

## 2021-01-17 ENCOUNTER — Other Ambulatory Visit (HOSPITAL_COMMUNITY): Payer: Self-pay

## 2021-01-22 ENCOUNTER — Other Ambulatory Visit (HOSPITAL_COMMUNITY): Payer: Self-pay

## 2021-01-28 ENCOUNTER — Other Ambulatory Visit (HOSPITAL_COMMUNITY): Payer: Self-pay

## 2021-01-31 DIAGNOSIS — F4323 Adjustment disorder with mixed anxiety and depressed mood: Secondary | ICD-10-CM | POA: Diagnosis not present

## 2021-02-11 ENCOUNTER — Other Ambulatory Visit: Payer: Self-pay | Admitting: Family Medicine

## 2021-02-11 DIAGNOSIS — Z1231 Encounter for screening mammogram for malignant neoplasm of breast: Secondary | ICD-10-CM

## 2021-02-14 ENCOUNTER — Other Ambulatory Visit (HOSPITAL_COMMUNITY): Payer: Self-pay

## 2021-02-14 DIAGNOSIS — F4323 Adjustment disorder with mixed anxiety and depressed mood: Secondary | ICD-10-CM | POA: Diagnosis not present

## 2021-02-18 ENCOUNTER — Other Ambulatory Visit: Payer: Self-pay | Admitting: Physician Assistant

## 2021-02-18 ENCOUNTER — Other Ambulatory Visit (HOSPITAL_COMMUNITY): Payer: Self-pay

## 2021-02-18 MED ORDER — RINVOQ 15 MG PO TB24
15.0000 mg | ORAL_TABLET | Freq: Every day | ORAL | 0 refills | Status: DC
  Filled 2021-02-18: qty 30, 30d supply, fill #0

## 2021-02-18 NOTE — Telephone Encounter (Addendum)
Next Visit: 04/16/2021  Last Visit: 11/14/2020  Last Fill: 11/21/2020  XU:XYBFXOVANV arthritis involving multiple sites with positive rheumatoid factor   Current Dose per phone note 11/21/2020: Rinvoq 15 mg daily.    Labs: 10/12/2020 CBC and CMP are within normal limits.  TB Gold: 10/12/2020 Neg    Patient advised she is due to update labs. Patient states she will update them on Thursday.   Okay to refill Rinvoq?

## 2021-02-21 ENCOUNTER — Other Ambulatory Visit: Payer: Self-pay | Admitting: *Deleted

## 2021-02-21 ENCOUNTER — Other Ambulatory Visit (HOSPITAL_COMMUNITY): Payer: Self-pay

## 2021-02-21 DIAGNOSIS — Z79899 Other long term (current) drug therapy: Secondary | ICD-10-CM

## 2021-02-22 LAB — CBC WITH DIFFERENTIAL/PLATELET
Absolute Monocytes: 584 cells/uL (ref 200–950)
Basophils Absolute: 32 cells/uL (ref 0–200)
Basophils Relative: 0.4 %
Eosinophils Absolute: 112 cells/uL (ref 15–500)
Eosinophils Relative: 1.4 %
HCT: 40.3 % (ref 35.0–45.0)
Hemoglobin: 13.2 g/dL (ref 11.7–15.5)
Lymphs Abs: 2432 cells/uL (ref 850–3900)
MCH: 29.9 pg (ref 27.0–33.0)
MCHC: 32.8 g/dL (ref 32.0–36.0)
MCV: 91.4 fL (ref 80.0–100.0)
MPV: 10.3 fL (ref 7.5–12.5)
Monocytes Relative: 7.3 %
Neutro Abs: 4840 cells/uL (ref 1500–7800)
Neutrophils Relative %: 60.5 %
Platelets: 304 10*3/uL (ref 140–400)
RBC: 4.41 10*6/uL (ref 3.80–5.10)
RDW: 16.3 % — ABNORMAL HIGH (ref 11.0–15.0)
Total Lymphocyte: 30.4 %
WBC: 8 10*3/uL (ref 3.8–10.8)

## 2021-02-22 LAB — COMPLETE METABOLIC PANEL WITH GFR
AG Ratio: 1.3 (calc) (ref 1.0–2.5)
ALT: 22 U/L (ref 6–29)
AST: 22 U/L (ref 10–35)
Albumin: 4 g/dL (ref 3.6–5.1)
Alkaline phosphatase (APISO): 72 U/L (ref 37–153)
BUN: 18 mg/dL (ref 7–25)
CO2: 26 mmol/L (ref 20–32)
Calcium: 9.3 mg/dL (ref 8.6–10.4)
Chloride: 103 mmol/L (ref 98–110)
Creat: 0.51 mg/dL (ref 0.50–1.03)
Globulin: 3.1 g/dL (calc) (ref 1.9–3.7)
Glucose, Bld: 110 mg/dL — ABNORMAL HIGH (ref 65–99)
Potassium: 4.3 mmol/L (ref 3.5–5.3)
Sodium: 140 mmol/L (ref 135–146)
Total Bilirubin: 0.4 mg/dL (ref 0.2–1.2)
Total Protein: 7.1 g/dL (ref 6.1–8.1)
eGFR: 109 mL/min/{1.73_m2} (ref >=60)

## 2021-02-22 NOTE — Progress Notes (Signed)
Glucose is 110. Rest of CMP WNL.  RDW remains elevated but stable. Rest of CBC WNL.

## 2021-02-27 ENCOUNTER — Other Ambulatory Visit (HOSPITAL_COMMUNITY): Payer: Self-pay

## 2021-03-05 DIAGNOSIS — F4323 Adjustment disorder with mixed anxiety and depressed mood: Secondary | ICD-10-CM | POA: Diagnosis not present

## 2021-03-18 ENCOUNTER — Other Ambulatory Visit (HOSPITAL_COMMUNITY): Payer: Self-pay

## 2021-03-18 DIAGNOSIS — B351 Tinea unguium: Secondary | ICD-10-CM | POA: Diagnosis not present

## 2021-03-18 DIAGNOSIS — M792 Neuralgia and neuritis, unspecified: Secondary | ICD-10-CM | POA: Diagnosis not present

## 2021-03-19 ENCOUNTER — Other Ambulatory Visit (HOSPITAL_COMMUNITY): Payer: Self-pay

## 2021-03-19 DIAGNOSIS — Z1231 Encounter for screening mammogram for malignant neoplasm of breast: Secondary | ICD-10-CM

## 2021-03-20 ENCOUNTER — Other Ambulatory Visit (HOSPITAL_COMMUNITY): Payer: Self-pay

## 2021-03-20 ENCOUNTER — Other Ambulatory Visit: Payer: Self-pay | Admitting: Physician Assistant

## 2021-03-20 DIAGNOSIS — M0579 Rheumatoid arthritis with rheumatoid factor of multiple sites without organ or systems involvement: Secondary | ICD-10-CM

## 2021-03-20 MED ORDER — RINVOQ 15 MG PO TB24
15.0000 mg | ORAL_TABLET | Freq: Every day | ORAL | 2 refills | Status: DC
  Filled 2021-03-20: qty 30, 30d supply, fill #0

## 2021-03-20 MED ORDER — OTREXUP 25 MG/0.4ML ~~LOC~~ SOAJ
SUBCUTANEOUS | 0 refills | Status: DC
  Filled 2021-03-20: qty 1.6, 28d supply, fill #0

## 2021-03-20 NOTE — Telephone Encounter (Signed)
Next Visit: 04/16/2021  Last Visit: 11/14/2020  Last Fill: Rinvoq 02/18/2021, MTX 12/20/2020  DX: Rheumatoid arthritis involving multiple sites with positive rheumatoid factor   Current Dose per office note 11/14/2020:  Rinvoq 15 mg daily, methotrexate 25 mg sq injections  Labs: 02/21/2021, Glucose is 110. Rest of CMP WNL.  RDW remains elevated but stable. Rest of CBC WNL.  TB Gold: 10/12/2020,  TB gold negative   Okay to refill Rinvoq and MTX?

## 2021-03-22 ENCOUNTER — Other Ambulatory Visit (HOSPITAL_COMMUNITY): Payer: Self-pay

## 2021-03-25 ENCOUNTER — Other Ambulatory Visit (HOSPITAL_COMMUNITY): Payer: Self-pay

## 2021-03-28 ENCOUNTER — Other Ambulatory Visit (HOSPITAL_COMMUNITY): Payer: Self-pay

## 2021-04-01 ENCOUNTER — Encounter: Payer: Self-pay | Admitting: Family Medicine

## 2021-04-01 ENCOUNTER — Other Ambulatory Visit: Payer: Self-pay

## 2021-04-01 ENCOUNTER — Encounter: Payer: Self-pay | Admitting: Rheumatology

## 2021-04-01 ENCOUNTER — Ambulatory Visit (INDEPENDENT_AMBULATORY_CARE_PROVIDER_SITE_OTHER): Payer: BC Managed Care – PPO | Admitting: Family Medicine

## 2021-04-01 VITALS — BP 124/80 | HR 90 | Temp 97.5°F | Resp 18 | Ht 62.0 in | Wt 236.8 lb

## 2021-04-01 DIAGNOSIS — Z23 Encounter for immunization: Secondary | ICD-10-CM

## 2021-04-01 DIAGNOSIS — Z0000 Encounter for general adult medical examination without abnormal findings: Secondary | ICD-10-CM | POA: Diagnosis not present

## 2021-04-01 DIAGNOSIS — M0579 Rheumatoid arthritis with rheumatoid factor of multiple sites without organ or systems involvement: Secondary | ICD-10-CM | POA: Diagnosis not present

## 2021-04-01 DIAGNOSIS — Z6841 Body Mass Index (BMI) 40.0 and over, adult: Secondary | ICD-10-CM | POA: Diagnosis not present

## 2021-04-01 LAB — CBC WITH DIFFERENTIAL/PLATELET
Basophils Absolute: 0 10*3/uL (ref 0.0–0.1)
Basophils Relative: 0.4 % (ref 0.0–3.0)
Eosinophils Absolute: 0.1 10*3/uL (ref 0.0–0.7)
Eosinophils Relative: 1 % (ref 0.0–5.0)
HCT: 38 % (ref 36.0–46.0)
Hemoglobin: 12.7 g/dL (ref 12.0–15.0)
Lymphocytes Relative: 31.5 % (ref 12.0–46.0)
Lymphs Abs: 2.3 10*3/uL (ref 0.7–4.0)
MCHC: 33.3 g/dL (ref 30.0–36.0)
MCV: 92.7 fl (ref 78.0–100.0)
Monocytes Absolute: 0.5 10*3/uL (ref 0.1–1.0)
Monocytes Relative: 7.3 % (ref 3.0–12.0)
Neutro Abs: 4.4 10*3/uL (ref 1.4–7.7)
Neutrophils Relative %: 59.8 % (ref 43.0–77.0)
Platelets: 281 10*3/uL (ref 150.0–400.0)
RBC: 4.1 Mil/uL (ref 3.87–5.11)
RDW: 16.8 % — ABNORMAL HIGH (ref 11.5–15.5)
WBC: 7.4 10*3/uL (ref 4.0–10.5)

## 2021-04-01 LAB — LIPID PANEL
Cholesterol: 220 mg/dL — ABNORMAL HIGH (ref 0–200)
HDL: 52.1 mg/dL (ref >39.00)
LDL Cholesterol: 144 mg/dL — ABNORMAL HIGH (ref 0–99)
NonHDL: 167.49
Total CHOL/HDL Ratio: 4
Triglycerides: 118 mg/dL (ref 0.0–149.0)
VLDL: 23.6 mg/dL (ref 0.0–40.0)

## 2021-04-01 LAB — TSH: TSH: 2.28 u[IU]/mL (ref 0.35–5.50)

## 2021-04-01 LAB — COMPREHENSIVE METABOLIC PANEL
ALT: 22 U/L (ref 0–35)
AST: 22 U/L (ref 0–37)
Albumin: 3.9 g/dL (ref 3.5–5.2)
Alkaline Phosphatase: 76 U/L (ref 39–117)
BUN: 17 mg/dL (ref 6–23)
CO2: 29 mEq/L (ref 19–32)
Calcium: 9.1 mg/dL (ref 8.4–10.5)
Chloride: 103 mEq/L (ref 96–112)
Creatinine, Ser: 0.54 mg/dL (ref 0.40–1.20)
GFR: 103 mL/min (ref >60.00)
Glucose, Bld: 81 mg/dL (ref 70–99)
Potassium: 4.4 mEq/L (ref 3.5–5.1)
Sodium: 139 mEq/L (ref 135–145)
Total Bilirubin: 0.4 mg/dL (ref 0.2–1.2)
Total Protein: 7.1 g/dL (ref 6.0–8.3)

## 2021-04-01 NOTE — Patient Instructions (Signed)
Preventive Care 92-56 Years Old, Female Preventive care refers to lifestyle choices and visits with your health care provider that can promote health and wellness. Preventive care visits are also called wellness exams. What can I expect for my preventive care visit? Counseling Your health care provider may ask you questions about your: Medical history, including: Past medical problems. Family medical history. Pregnancy history. Current health, including: Menstrual cycle. Method of birth control. Emotional well-being. Home life and relationship well-being. Sexual activity and sexual health. Lifestyle, including: Alcohol, nicotine or tobacco, and drug use. Access to firearms. Diet, exercise, and sleep habits. Work and work Statistician. Sunscreen use. Safety issues such as seatbelt and bike helmet use. Physical exam Your health care provider will check your: Height and weight. These may be used to calculate your BMI (body mass index). BMI is a measurement that tells if you are at a healthy weight. Waist circumference. This measures the distance around your waistline. This measurement also tells if you are at a healthy weight and may help predict your risk of certain diseases, such as type 2 diabetes and high blood pressure. Heart rate and blood pressure. Body temperature. Skin for abnormal spots. What immunizations do I need? Vaccines are usually given at various ages, according to a schedule. Your health care provider will recommend vaccines for you based on your age, medical history, and lifestyle or other factors, such as travel or where you work. What tests do I need? Screening Your health care provider may recommend screening tests for certain conditions. This may include: Lipid and cholesterol levels. Diabetes screening. This is done by checking your blood sugar (glucose) after you have not eaten for a while (fasting). Pelvic exam and Pap test. Hepatitis B test. Hepatitis C  test. HIV (human immunodeficiency virus) test. STI (sexually transmitted infection) testing, if you are at risk. Lung cancer screening. Colorectal cancer screening. Mammogram. Talk with your health care provider about when you should start having regular mammograms. This may depend on whether you have a family history of breast cancer. BRCA-related cancer screening. This may be done if you have a family history of breast, ovarian, tubal, or peritoneal cancers. Bone density scan. This is done to screen for osteoporosis. Talk with your health care provider about your test results, treatment options, and if necessary, the need for more tests. Follow these instructions at home: Eating and drinking  Eat a diet that includes fresh fruits and vegetables, whole grains, lean protein, and low-fat dairy products. Take vitamin and mineral supplements as recommended by your health care provider. Do not drink alcohol if: Your health care provider tells you not to drink. You are pregnant, may be pregnant, or are planning to become pregnant. If you drink alcohol: Limit how much you have to 0-1 drink a day. Know how much alcohol is in your drink. In the [place] one drink equals one 12 oz bottle of beer (355 mL), one 5 oz glass of wine (148 mL), or one 1 oz glass of hard liquor (44 mL). Lifestyle Brush your teeth every morning and night with fluoride toothpaste. Floss one time each day. Exercise for at least 30 minutes 5 or more days each week. Do not use any products that contain nicotine or tobacco. These products include cigarettes, chewing tobacco, and vaping devices, such as e-cigarettes. If you need help quitting, ask your health care provider. Do not use drugs. If you are sexually active, practice safe sex. Use a condom or other form of protection to prevent  STIs. If you do not wish to become pregnant, use a form of birth control. If you plan to become pregnant, see your health care provider for a  prepregnancy visit. Take aspirin only as told by your health care provider. Make sure that you understand how much to take and what form to take. Work with your health care provider to find out whether it is safe and beneficial for you to take aspirin daily. Find healthy ways to manage stress, such as: Meditation, yoga, or listening to music. Journaling. Talking to a trusted person. Spending time with friends and family. Minimize exposure to UV radiation to reduce your risk of skin cancer. Safety Always wear your seat belt while driving or riding in a vehicle. Do not drive: If you have been drinking alcohol. Do not ride with someone who has been drinking. When you are tired or distracted. While texting. If you have been using any mind-altering substances or drugs. Wear a helmet and other protective equipment during sports activities. If you have firearms in your house, make sure you follow all gun safety procedures. Seek help if you have been physically or sexually abused. What's next? Visit your health care provider once a year for an annual wellness visit. Ask your health care provider how often you should have your eyes and teeth checked. Stay up to date on all vaccines. This information is not intended to replace advice given to you by your health care provider. Make sure you discuss any questions you have with your health care provider. Document Revised: 10/31/2020 Document Reviewed: 10/31/2020 Elsevier Patient Education  [employer].

## 2021-04-01 NOTE — Assessment & Plan Note (Addendum)
ghm utd Check labs Pneum 20 and flu given today She will get shingrix and covid booster another time Pt will go to gyn Mammogram scheduled

## 2021-04-01 NOTE — Assessment & Plan Note (Signed)
Per rheum 

## 2021-04-01 NOTE — Progress Notes (Signed)
Subjective:   By signing my name below, I, Donna Lopez, attest that this documentation has been prepared under the direction and in the presence of Donna Held, DO. 04/01/2021    Patient ID: Donna Lopez, female    DOB: 11-29-1965, 55 y.o.   MRN: [id number]  Chief Complaint  Patient presents with   Annual Exam    Pt states fasting     HPI Patient is in today for a comprehensive physical exam.  She recently started 15 mg Rinvoq to manage her arthritis and mentions she is having some side effects. She will discuss these with her rheumatologist later in the month.  She recently saw a podiatrist because she has calluses on her toes and has trouble putting on her shoes.   She denies fever, hearing loss, ear pain,congestion, sinus pain, sore throat, eye pain, chest pain, palpitations, cough, shortness of breath, wheezing, nausea. vomiting, diarrhea, constipation, blood in stool, dysuria,frequency, hematuria and headaches.   She is rarely walking for exercise since she has been dealing with family issues.  She has 3 Pfizer Covid-19 vaccines at this time. She will receive the flu and pneumonia vaccine today.  There has been no recent changes in her family medical history. She has had no recent surgeries.  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    Vitamin D deficiency     Past Surgical History:  Procedure Laterality Date   COLONOSCOPY  2014   DB- normal    KNEE  ARTHROPLASTY     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   Procedure: LEFT TOTAL KNEE ARTHROPLASTY;  Surgeon: Mcarthur Rossetti, MD;  Location: [facility];  Service: Orthopedics;  Laterality: Left;   UPPER GASTROINTESTINAL ENDOSCOPY      Family History  Problem Relation Age of Onset   Uterine cancer Mother    Diabetes Father    Kidney cancer Father    Colon cancer Sister 22   Rectal cancer Sister    Colon cancer Paternal Grandfather    Colon polyps Brother    Cancer Sister        breast    Healthy Daughter    Esophageal cancer Neg Hx    Stomach cancer Neg Hx     Social History   Socioeconomic History   Marital status: Married    Spouse name: Not on file   Number of children: Not on file   Years of education: Not on file   Highest education level: Not on file  Occupational History    Employer: [employer]    Comment: marketing  Tobacco Use   Smoking status: Never   Smokeless tobacco: Never  Vaping Use   Vaping Use: Never used  Substance and Sexual Activity   Alcohol use: Yes    Comment: rare alcohol use-once/twice a year   Drug use: Never   Sexual activity:  Yes    Partners: Male  Other Topics Concern   Not on file  Social History Narrative   Exercise--- sometimes walks 1x a week   Social Determinants of Health   Financial Resource Strain: Not on file  Food Insecurity: Not on file  Transportation Needs: Not on file  Physical Activity: Not on file  Stress: Not on file  Social Connections: Not on file  Intimate Partner Violence: Not on file    Outpatient Medications Prior to Visit  Medication Sig Dispense Refill   acyclovir cream (ZOVIRAX) 5 % Apply 1 application topically every 3 (three) hours. 15 g 2   aspirin 81 MG chewable tablet Chew 1 tablet (81 mg total) by mouth 2 (two) times daily. (Patient taking differently: Chew 81 mg by mouth daily.) 30 tablet 0   Calcium Carb-Cholecalciferol (CALCIUM + D3) 600-200 MG-UNIT TABS Take 1 capsule by mouth daily.      Cholecalciferol (VITAMIN D) 50 MCG (2000 UT) CAPS Take 2,000 Units by mouth daily.     esomeprazole (NEXIUM) 40 MG capsule Take 1 capsule (40 mg total) by mouth daily. 90 capsule 3   folic acid (FOLVITE) 1 MG tablet TAKE 2 TABLETS BY MOUTH EVERY DAY 180 tablet 3   Glucos-Chondroit-Hyaluron-MSM (GLUCOSAMINE CHONDROITIN JOINT PO) Take 1 tablet by mouth daily.     L-Lysine 1000 MG TABS Take 1,000 mg by mouth daily.      Methotrexate, PF, (OTREXUP) 25 MG/0.4ML SOAJ INJECT 25 MG INTO THE SKIN EVERY 7 DAYS. 4.8 mL 0   Upadacitinib ER (RINVOQ) 15 MG TB24 Take 1 tablet (15 mg) by mouth daily. 30 tablet 2   valACYclovir (VALTREX) 1000 MG tablet 2 po x1 at first sign of cold sore then repeat in 12 h 30 tablet 2   No facility-administered medications prior to visit.    Allergies  Allergen Reactions   Penicillins Hives    Did it involve swelling of the face/tongue/throat, SOB, or low BP? No Did it involve sudden or severe rash/hives, skin peeling, or any reaction on the inside of your mouth or nose? No Did you need to seek medical attention at a hospital or doctor's office? Yes When did it last happen?      15 -20 years ago If all above answers are "NO", may proceed with cephalosporin use.     Review of Systems  Constitutional:  Negative for fever.  HENT:  Negative for congestion, ear pain, hearing loss, sinus pain and sore throat.   Eyes:  Negative for blurred vision and pain.  Respiratory:  Negative for cough, sputum production, shortness of breath and wheezing.   Cardiovascular:  Negative for chest pain and palpitations.  Gastrointestinal:  Negative for blood in stool, constipation, diarrhea, nausea and vomiting.  Genitourinary:  Negative for dysuria, frequency, hematuria and urgency.  Musculoskeletal:  Negative for back pain, falls and myalgias.  Neurological:  Negative for dizziness, sensory change, loss of consciousness, weakness and headaches.  Endo/Heme/Allergies:  Negative for  environmental allergies. Does not bruise/bleed easily.  Psychiatric/Behavioral:  Negative for depression and suicidal ideas. The patient is not nervous/anxious and does not have insomnia.       Objective:    Physical Exam Constitutional:      General: She is not in acute distress.    Appearance: Normal appearance. She is not ill-appearing.  HENT:     Head: Normocephalic and atraumatic.     Right Ear: Tympanic membrane, ear canal and external ear normal.  Left Ear: Tympanic membrane, ear canal and external ear normal.  Eyes:     Extraocular Movements: Extraocular movements intact.     Pupils: Pupils are equal, round, and reactive to light.  Cardiovascular:     Rate and Rhythm: Normal rate and regular rhythm.     Pulses: Normal pulses.     Heart sounds: Normal heart sounds. No murmur heard.   No gallop.  Pulmonary:     Effort: Pulmonary effort is normal. No respiratory distress.     Breath sounds: Normal breath sounds. No wheezing, rhonchi or rales.  Abdominal:     General: Bowel sounds are normal. There is no distension.     Palpations: Abdomen is soft. There is no mass.     Tenderness: There is no abdominal tenderness. There is no guarding or rebound.     Hernia: No hernia is present.  Musculoskeletal:     Cervical back: Normal range of motion and neck supple.  Lymphadenopathy:     Cervical: No cervical adenopathy.  Skin:    General: Skin is warm and dry.  Neurological:     Mental Status: She is alert and oriented to person, place, and time.  Psychiatric:        Behavior: Behavior normal.    BP 124/80 (BP Location: Left Arm, Patient Position: Sitting, Cuff Size: Large)   Pulse 90   Temp (!) 97.5 F (36.4 C) (Oral)   Resp 18   Ht 5' 2"  (1.575 m)   Wt 236 lb 12.8 oz (107.4 kg)   LMP 08/18/2018   SpO2 97%   BMI 43.31 kg/m  Wt Readings from Last 3 Encounters:  04/01/21 236 lb 12.8 oz (107.4 kg)  11/14/20 229 lb 12.8 oz (104.2 kg)  08/03/20 234 lb 6.4 oz (106.3  kg)    Diabetic Foot Exam - Simple   No data filed    Lab Results  Component Value Date   WBC 8.0 02/21/2021   HGB 13.2 02/21/2021   HCT 40.3 02/21/2021   PLT 304 02/21/2021   GLUCOSE 110 (H) 02/21/2021   CHOL 191 03/30/2020   TRIG 109.0 03/30/2020   HDL 46.60 03/30/2020   LDLDIRECT 123.0 03/17/2019   LDLCALC 123 (H) 03/30/2020   ALT 22 02/21/2021   AST 22 02/21/2021   NA 140 02/21/2021   K 4.3 02/21/2021   CL 103 02/21/2021   CREATININE 0.51 02/21/2021   BUN 18 02/21/2021   CO2 26 02/21/2021   TSH 2.00 03/30/2020   HGBA1C 5.3 02/22/2009    Lab Results  Component Value Date   TSH 2.00 03/30/2020   Lab Results  Component Value Date   WBC 8.0 02/21/2021   HGB 13.2 02/21/2021   HCT 40.3 02/21/2021   MCV 91.4 02/21/2021   PLT 304 02/21/2021   Lab Results  Component Value Date   NA 140 02/21/2021   K 4.3 02/21/2021   CO2 26 02/21/2021   GLUCOSE 110 (H) 02/21/2021   BUN 18 02/21/2021   CREATININE 0.51 02/21/2021   BILITOT 0.4 02/21/2021   ALKPHOS 96 03/30/2020   AST 22 02/21/2021   ALT 22 02/21/2021   PROT 7.1 02/21/2021   ALBUMIN 3.8 03/30/2020   CALCIUM 9.3 02/21/2021   ANIONGAP 9 12/18/2018   EGFR 109 02/21/2021   GFR 104.20 03/30/2020   Lab Results  Component Value Date   CHOL 191 03/30/2020   Lab Results  Component Value Date   HDL 46.60 03/30/2020   Lab Results  Component Value Date   LDLCALC 123 (H) 03/30/2020   Lab Results  Component Value Date   TRIG 109.0 03/30/2020   Lab Results  Component Value Date   CHOLHDL 4 03/30/2020   Lab Results  Component Value Date   HGBA1C 5.3 02/22/2009       Mammogram: Last checked on 12/15/2018. Results were normal. Next appointment on 04/23/2021. Pap Smear: Last checked on 01/07/2018. Results were normal. Repeat in 3 years. Due Colonoscopy: Last completed on 12/02/2017. There was diverticulosis in the sigmoid and descending colon along with non-bleeding internal hemorrhoids. Otherwise exam  was normal. Repeat in 5 years. Dexa: Last completed on 02/27/2020. Patient is considered osteoporotic according to the [facility]. Repeat in 2 years.  Assessment & Plan:   Problem List Items Addressed This Visit       Unprioritized   BMI 40.0-44.9, adult ([place])    Discussed exercise Pt is dealing with her own health issues and taking care of an aging mother       Preventative health care - Primary    ghm utd Check labs Pneum 20 and flu given today She will get shingrix and covid booster another time Pt will go to gyn Mammogram scheduled      Relevant Orders   TSH   Comprehensive metabolic panel   CBC with Differential/Platelet   Lipid panel   Rheumatoid arthritis ([place])    Per rheum      Other Visit Diagnoses     Need for influenza vaccination       Relevant Orders   Flu Vaccine QUAD 14moIM (Fluarix, Fluzone & Alfiuria Quad PF) (Completed)   Need for pneumococcal vaccination       Relevant Orders   Pneumococcal conjugate vaccine 20-valent (Prevnar 20) (Completed)       No orders of the defined types were placed in this encounter.   I,Donna Lopez,acting as a sEducation administratorfor [employer] DO.,have documented all relevant documentation on the behalf of YAnn Held DO,as directed by  YAnn Held DO while in the presence of YHettinger DO. , personally preformed the services described in this documentation.  All medical record entries made by the scribe were at my direction and in my presence.  I have reviewed the chart and discharge instructions (if applicable) and agree that the record reflects my personal performance and is accurate and complete. 04/01/2021

## 2021-04-01 NOTE — Assessment & Plan Note (Signed)
Discussed exercise Pt is dealing with her own health issues and taking care of an aging mother

## 2021-04-02 NOTE — Progress Notes (Signed)
Office Visit Note  Patient: Donna Lopez             Date of Birth: 06-25-1965           MRN: [id number]             PCP: Donato Schultz, DO Referring: Donato Schultz, * Visit Date: 04/16/2021 Occupation: @GUAROCC @  Subjective:  Discuss side effects with rinvoq  History of Present Illness: Donna Lopez is a 55 y.o. female with history of seropositive rheumatoid arthritis, osteoarthritis, osteoporosis, and DDD.  Patient is currently on Rinvoq 15 mg 1 tablet by mouth daily, methotrexate 25 mg sq injections once weekly, and folic acid 2 mg daily.  She was started on Rinvoq in July 2022.  Patient reports that in August she started to notice acne as well as a daily cough.  She states that in the mornings the cough is productive but in the afternoons it is a dry cough.  She denies any fevers or congestion.  She states that she has found the combination therapy to be very effective at managing her rheumatoid arthritis.  She states that she has noticed over a 60% improvement in her joint pain and inflammation.  She has intermittent swelling in her hands but denies any joint pain at this time.  She states that her left knee replacement is doing better.  She denies any increased discomfort in her feet or ankle joints at this time.  She is feeling frustrated since she has noticed such an improvement since starting on Rinvoq but is having difficulty tolerating it.   Activities of Daily Living:  Patient reports morning stiffness for 0 minutes.   Patient Denies nocturnal pain.  Difficulty dressing/grooming: Denies Difficulty climbing stairs: Denies Difficulty getting out of chair: Denies Difficulty using hands for taps, buttons, cutlery, and/or writing: Denies  Review of Systems  Constitutional:  Negative for fatigue.  HENT:  Negative for mouth sores, mouth dryness and nose dryness.   Eyes:  Negative for pain, itching and dryness.  Respiratory:  Positive for cough. Negative for  shortness of breath and difficulty breathing.   Cardiovascular:  Negative for chest pain and palpitations.  Gastrointestinal:  Negative for blood in stool, constipation and diarrhea.  Endocrine: Negative for increased urination.  Genitourinary:  Negative for difficulty urinating.  Musculoskeletal:  Negative for joint pain, joint pain, joint swelling, myalgias, morning stiffness, muscle tenderness and myalgias.  Skin:  Negative for color change, rash and redness.  Allergic/Immunologic: Negative for susceptible to infections.  Neurological:  Positive for headaches. Negative for dizziness, numbness, memory loss and weakness.  Hematological:  Negative for bruising/bleeding tendency.  Psychiatric/Behavioral:  Negative for confusion.    PMFS History:  Patient Active Problem List   Diagnosis Date Noted   Preventative health care 04/01/2021   Unilateral primary osteoarthritis, left knee 12/17/2018   Status post total left knee replacement 12/17/2018   Primary osteoarthritis of left knee 11/23/2018   Carpal tunnel syndrome, left upper limb 11/23/2018   BMI 40.0-44.9, adult (HCC) 07/12/2018   Primary osteoarthritis of both knees 12/24/2016   DDD (degenerative disc disease), thoracic 11/17/2016   DDD (degenerative disc disease), lumbar 11/17/2016   Vitamin D deficiency 11/17/2016   History of rosacea/ History of acne 11/17/2016   High risk medications (not anticoagulants) long-term use 06/25/2016   History of degenerative disc disease 06/25/2016   Severe obesity (BMI >= 40) (HCC) 06/13/2014   Left-sided thoracic back pain 07/19/2013  PALPITATIONS 10/17/2009   UTI 05/24/2009   Rheumatoid arthritis ([place]) 05/24/2009   Hair loss 02/22/2009   VAGINAL BLEEDING 10/05/2008   Carrier of group B Streptococcus 07/28/2007   GERD 12/28/2006   CARDIAC MURMUR, HX OF 12/28/2006   HIATAL HERNIA, HX OF 12/28/2006    Past Medical History:  Diagnosis Date   Allergy    Arthritis    Carpal tunnel  syndrome 10/2018   DDD (degenerative disc disease), lumbar    DDD (degenerative disc disease), thoracic    Family history of adverse reaction to anesthesia    Mothers sister never woke up from surgery   GERD (gastroesophageal reflux disease)    Grade I diastolic dysfunction 0000000   Heart murmur    Heart palpitations    History of   History of hiatal hernia    OA (osteoarthritis)    Obese    Osteoporosis    Rheumatoid arthritis ([place])    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    Vitamin D deficiency     Family History  Problem Relation Age of Onset   Uterine cancer Mother    Diabetes Father    Kidney cancer Father    Colon cancer Sister 54   Rectal cancer Sister    Colon cancer Paternal Grandfather    Colon polyps Brother    Cancer Sister        breast    Healthy Daughter    Esophageal cancer Neg Hx    Stomach cancer Neg Hx    Past Surgical History:  Procedure Laterality Date   COLONOSCOPY  2014   DB- normal    KNEE ARTHROPLASTY     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   Procedure: LEFT TOTAL KNEE ARTHROPLASTY;  Surgeon: Mcarthur Rossetti, MD;  Location: [facility];  Service: Orthopedics;  Laterality: Left;   UPPER GASTROINTESTINAL ENDOSCOPY     Social History   Social History Narrative   Exercise--- sometimes walks 1x a week   Immunization History  Administered Date(s) Administered   Influenza Whole 02/08/2009   Influenza,inj,Quad PF,6+ Mos 03/09/2013, 06/13/2014, 06/16/2016, 02/14/2019, 03/30/2020, 04/01/2021   PFIZER(Purple Top)SARS-COV-2 Vaccination 08/26/2019, 09/27/2019, 05/30/2020   PNEUMOCOCCAL CONJUGATE-20 04/01/2021   Td 09/29/2001   Tdap 06/13/2014     Objective: Vital Signs: BP 130/84 (BP Location: Left Arm, Patient Position: Sitting, Cuff Size: Normal)   Pulse 80   Ht 5\' 2"  (1.575 m)   Wt 237 lb (107.5 kg)   LMP 08/18/2018   BMI 43.35 kg/m    Physical Exam Vitals and nursing note reviewed.  Constitutional:      Appearance: She is well-developed.   HENT:     Head: Normocephalic and atraumatic.  Eyes:     Conjunctiva/sclera: Conjunctivae normal.  Cardiovascular:     Rate and Rhythm: Normal rate and regular rhythm.     Heart sounds: Normal heart sounds.  Pulmonary:     Effort: Pulmonary effort is normal.     Breath sounds: Normal breath sounds.  Abdominal:     General: Bowel sounds are normal.     Palpations: Abdomen is soft.  Musculoskeletal:     Cervical back: Normal range of motion.  Skin:    General: Skin is warm and dry.     Capillary Refill: Capillary refill takes less than 2 seconds.  Neurological:     Mental Status: She is alert and oriented to person, place, and time.  Psychiatric:        Behavior: Behavior normal.  Musculoskeletal Exam:  C-spine has good ROM.  Postural thoracic kyphosis.  Shoulder joints and elbow joints have good range of motion with no discomfort.  Limited flexion and extension of both wrist joints especially the right wrist.  Synovial thickening over the wrist joints and MCP joints.  No tenderness or synovitis over MCP or PIP joints.  PIP and DIP thickening consistent with osteoarthritis of both hands.  Hip joints have good range of motion with no groin pain.  Left knee replacement has good range of motion with warmth but no effusion.  Right knee joint has good range of motion with no warmth or effusion.  Ankle joints have good range of motion with no tenderness or joint swelling.  CDAI Exam: CDAI Score: 0.6  Patient Global: 3 mm; Provider Global: 3 mm Swollen: 0 ; Tender: 0  Joint Exam 04/16/2021   No joint exam has been documented for this visit   There is currently no information documented on the homunculus. Go to the Rheumatology activity and complete the homunculus joint exam.  Investigation: No additional findings.  Imaging: No results found.  Recent Labs: Lab Results  Component Value Date   WBC 7.4 04/01/2021   HGB 12.7 04/01/2021   PLT 281.0 04/01/2021   NA 139 04/01/2021    K 4.4 04/01/2021   CL 103 04/01/2021   CO2 29 04/01/2021   GLUCOSE 81 04/01/2021   BUN 17 04/01/2021   CREATININE 0.54 04/01/2021   BILITOT 0.4 04/01/2021   ALKPHOS 76 04/01/2021   AST 22 04/01/2021   ALT 22 04/01/2021   PROT 7.1 04/01/2021   ALBUMIN 3.9 04/01/2021   CALCIUM 9.1 04/01/2021   GFRAA 114 10/12/2020   QFTBGOLDPLUS NEGATIVE 10/12/2020    Speciality Comments: Humira- quit working Simponi- inadequate response Orencia-since 07/2019 Reclast start 07/18/20  Procedures:  No procedures performed Allergies: Penicillins      Assessment / Plan:     Visit Diagnoses: Rheumatoid arthritis involving multiple sites with positive rheumatoid factor (HCC) - Positive rheumatoid factor, positive anti-CCP, erosive disease. She previously had an inadequate response to Humira and Simponi: She has no synovitis on examination today.  She has noticed significant improvement in her joint pain and inflammation since starting on Rinvoq 15 mg 1 tablet by mouth daily in July 2022.  She remains on methotrexate 25 mg subcutaneous injections once weekly and folic acid 2 mg daily.  She has had less difficulty with ADLs.  She has not had any morning stiffness or nocturnal pain.  She has been able to cut back on the use of over-the-counter products for symptomatic relief on current therapy.  She has not missed any doses of Rinvoq or methotrexate recently.   Several weeks after starting on Rinvoq she developed a daily persistent cough as well as worsening facial acne.  According to the patient her cough is productive in the morning but dry in the afternoon.  She has not had any fevers or congestion.  She has not had any shortness of breath or pleuritic chest pain.  She was advised to hold Rinvoq and methotrexate for the next 2 weeks.  Chest x-ray was ordered today for further evaluation. Discussed that the timing of her symptoms are consistent with a possible side effect of rinvoq but we will need to rule  out an underlying cardiopulmonary process.  Discussed the association of ILD with rheumatoid arthritis.  If her symptoms persist or worsen despite stopping rinvoq and methotrexate a referral to pulmonology will be placed  for further evaluation.  She will then require a high resolution chest CT for further evaluation.  Discussed that if her symptoms resolve after holding Rinvoq and methotrexate we will discuss other treatment options at her follow-up visit.  She may be a candidate for Morrie Sheldon which we allow [place] to make dose adjustments in the future if necessary. She will follow-up in the office in 2 weeks.- Plan: DG Chest 2 View  High risk medications (not anticoagulants) long-term use -Advised to hold Rinvoq 15mg  by mouth daily, methotrexate 25 mg sq injections, and folic acid 2 mg po daily until her follow-up visit in 2 weeks.  Chest x-ray was ordered today as discussed above.   CBC and CMP were updated on 04/01/2021.  She will be due to update lab work in February and every 3 months to monitor for drug toxicity.  Standing orders for CBC and CMP remain in place.  TB Gold negative on 10/12/2020. She has not had any recent infections.  - Plan: DG Chest 2 View Discussed the risks associated with taking a Jak inhibitor including the black box warning for increased risk for malignancy and major cardiovascular events.  Discussed the importance of modifying risk factors including close blood pressure monitoring as well as close monitoring of her lipid panel. Association of heart disease with rheumatoid arthritis was discussed. Need to monitor blood pressure, cholesterol, and to exercise 30-60 minutes on daily basis was discussed.   Primary osteoarthritis of right knee: She has good ROM of the right knee joint with no warmth or effusion.   S/P total knee arthroplasty, left: Doing well.  She has good ROM of the left knee replacement with warmth but no effusion.   DDD (degenerative disc disease), thoracic -  Postural thoracic kyphosis noted.  DDD (degenerative disc disease), lumbar: She is not experiencing any increased discomfort in her lower back at this time.  Age-related osteoporosis without current pathological fracture - DEXA on 02/02/20: Left femur neck BMD 0.437 with T-score -3.7. She received first dose of Reclast IV on July 18, 2020.  She will be due for her second Reclast infusion in March 2023.  She continues to take a calcium and vitamin D supplement daily. No recent falls or fractures. Her next bone density will be due in September 2023.  History of vitamin D deficiency: She is taking vitamin D 2000 units daily.   Chronic cough - She has had a cough since August 2022 several weeks after initiating Rinvoq.  In the mornings her cough has been productive but in the afternoons it tends to be dry.  She has not had any shortness of breath or pleuritic chest pain.  No fevers or congestion.  She has not tried holding rinvoq since it has been so effective at managing her RA.  CXR ordered today for further evaluation.  Advised the patient to hold Rinvoq and MTX until her follow up visit.  She will follow up in 2 weeks.  A referral to pulmonology will be placed pending CXR results. Plan: DG Chest 2 View  History of rosacea/ History of acne: She has noticed worsening acne since starting on rinvoq in July 2022.   Other medical conditions are listed as follows:   History of gastroesophageal reflux (GERD)  History of hiatal hernia   Orders: Orders Placed This Encounter  Procedures   DG Chest 2 View    No orders of the defined types were placed in this encounter.     Follow-Up Instructions: Return  in about 2 weeks (around 04/30/2021) for Rheumatoid arthritis, Osteoarthritis, Osteoporosis.   Ofilia Neas, PA-C  Note - This record has been created using Dragon software.  Chart creation errors have been sought, but may not always  have been located. Such creation errors do not reflect on   the standard of medical care.

## 2021-04-02 NOTE — Telephone Encounter (Signed)
I called patient, CD at front desk for pick up. 

## 2021-04-04 DIAGNOSIS — F4323 Adjustment disorder with mixed anxiety and depressed mood: Secondary | ICD-10-CM | POA: Diagnosis not present

## 2021-04-06 ENCOUNTER — Other Ambulatory Visit: Payer: Self-pay | Admitting: Family Medicine

## 2021-04-06 DIAGNOSIS — E785 Hyperlipidemia, unspecified: Secondary | ICD-10-CM

## 2021-04-08 ENCOUNTER — Other Ambulatory Visit: Payer: Self-pay

## 2021-04-10 ENCOUNTER — Other Ambulatory Visit: Payer: Self-pay | Admitting: Family Medicine

## 2021-04-10 DIAGNOSIS — B001 Herpesviral vesicular dermatitis: Secondary | ICD-10-CM

## 2021-04-14 ENCOUNTER — Other Ambulatory Visit: Payer: Self-pay | Admitting: Family Medicine

## 2021-04-14 DIAGNOSIS — B001 Herpesviral vesicular dermatitis: Secondary | ICD-10-CM

## 2021-04-16 ENCOUNTER — Ambulatory Visit: Payer: BC Managed Care – PPO | Admitting: Physician Assistant

## 2021-04-16 ENCOUNTER — Encounter: Payer: Self-pay | Admitting: Physician Assistant

## 2021-04-16 ENCOUNTER — Ambulatory Visit (HOSPITAL_COMMUNITY)
Admission: RE | Admit: 2021-04-16 | Discharge: 2021-04-16 | Disposition: A | Discharge status: Home / Self Care | Payer: BC Managed Care – PPO | Source: Ambulatory Visit | Attending: Physician Assistant | Admitting: Physician Assistant

## 2021-04-16 ENCOUNTER — Other Ambulatory Visit: Payer: Self-pay

## 2021-04-16 VITALS — BP 130/84 | HR 80 | Ht 62.0 in | Wt 237.0 lb

## 2021-04-16 DIAGNOSIS — Z79899 Other long term (current) drug therapy: Secondary | ICD-10-CM | POA: Insufficient documentation

## 2021-04-16 DIAGNOSIS — M0579 Rheumatoid arthritis with rheumatoid factor of multiple sites without organ or systems involvement: Secondary | ICD-10-CM

## 2021-04-16 DIAGNOSIS — Z8639 Personal history of other endocrine, nutritional and metabolic disease: Secondary | ICD-10-CM

## 2021-04-16 DIAGNOSIS — M1711 Unilateral primary osteoarthritis, right knee: Secondary | ICD-10-CM | POA: Diagnosis not present

## 2021-04-16 DIAGNOSIS — Z8719 Personal history of other diseases of the digestive system: Secondary | ICD-10-CM

## 2021-04-16 DIAGNOSIS — M51369 Other intervertebral disc degeneration, lumbar region without mention of lumbar back pain or lower extremity pain: Secondary | ICD-10-CM

## 2021-04-16 DIAGNOSIS — Z96652 Presence of left artificial knee joint: Secondary | ICD-10-CM

## 2021-04-16 DIAGNOSIS — R053 Chronic cough: Secondary | ICD-10-CM | POA: Insufficient documentation

## 2021-04-16 DIAGNOSIS — Z872 Personal history of diseases of the skin and subcutaneous tissue: Secondary | ICD-10-CM

## 2021-04-16 DIAGNOSIS — M810 Age-related osteoporosis without current pathological fracture: Secondary | ICD-10-CM

## 2021-04-16 DIAGNOSIS — J90 Pleural effusion, not elsewhere classified: Secondary | ICD-10-CM | POA: Diagnosis not present

## 2021-04-16 DIAGNOSIS — M5134 Other intervertebral disc degeneration, thoracic region: Secondary | ICD-10-CM

## 2021-04-16 DIAGNOSIS — M5136 Other intervertebral disc degeneration, lumbar region: Secondary | ICD-10-CM

## 2021-04-16 DIAGNOSIS — J9811 Atelectasis: Secondary | ICD-10-CM | POA: Diagnosis not present

## 2021-04-17 ENCOUNTER — Telehealth: Payer: Self-pay | Admitting: Family Medicine

## 2021-04-17 ENCOUNTER — Telehealth: Payer: Self-pay | Admitting: *Deleted

## 2021-04-17 ENCOUNTER — Other Ambulatory Visit (HOSPITAL_COMMUNITY): Payer: Self-pay

## 2021-04-17 DIAGNOSIS — M0579 Rheumatoid arthritis with rheumatoid factor of multiple sites without organ or systems involvement: Secondary | ICD-10-CM

## 2021-04-17 DIAGNOSIS — M19071 Primary osteoarthritis, right ankle and foot: Secondary | ICD-10-CM | POA: Diagnosis not present

## 2021-04-17 DIAGNOSIS — J90 Pleural effusion, not elsewhere classified: Secondary | ICD-10-CM

## 2021-04-17 NOTE — Telephone Encounter (Signed)
Patient would like to talk to [facility] about her rheumatologist findings. She stated that she was advised to get OV with PCP but wasn't sure if she needed it and since we did not have any availability for this week she would just prefer a call. She states she has a specialist appointment on Monday. Please advise.

## 2021-04-17 NOTE — Telephone Encounter (Signed)
-----   Message from Gearldine Bienenstock, PA-C sent at 04/17/2021  9:52 AM EST ----- CXR revealed small left pleural effusion and bibasilar atelectasis.   Dr. Corliss Skains recommends an urgent referral to pulm and to see PCP today.   She should continue to hold Rinvoq and MTX.

## 2021-04-17 NOTE — Progress Notes (Signed)
CXR revealed small left pleural effusion and bibasilar atelectasis.   Dr. Corliss Skains recommends an urgent referral to pulm and to see PCP today.   She should continue to hold Rinvoq and MTX.

## 2021-04-17 NOTE — Telephone Encounter (Signed)
Yes, patient will need to schedule at least a virtual if she would like discuss with [place].

## 2021-04-18 ENCOUNTER — Other Ambulatory Visit (HOSPITAL_COMMUNITY): Payer: Self-pay

## 2021-04-18 HISTORY — PX: THORACENTESIS: SHX235

## 2021-04-19 NOTE — Progress Notes (Deleted)
Office Visit Note  Patient: Donna Lopez             Date of Birth: [birth year]           MRN: [id number]             PCP: Donato Schultz, DO Referring: Donato Schultz, * Visit Date: 04/29/2021 Occupation: @GUAROCC @  Subjective:  No chief complaint on file.   History of Present Illness: Donna Lopez is a 55 y.o. female ***   Activities of Daily Living:  Patient reports morning stiffness for *** {minute/hour:19697}.   Patient {ACTIONS;DENIES/REPORTS:21021675::"Denies"} nocturnal pain.  Difficulty dressing/grooming: {ACTIONS;DENIES/REPORTS:21021675::"Denies"} Difficulty climbing stairs: {ACTIONS;DENIES/REPORTS:21021675::"Denies"} Difficulty getting out of chair: {ACTIONS;DENIES/REPORTS:21021675::"Denies"} Difficulty using hands for taps, buttons, cutlery, and/or writing: {ACTIONS;DENIES/REPORTS:21021675::"Denies"}  No Rheumatology ROS completed.   PMFS History:  Patient Active Problem List   Diagnosis Date Noted   Preventative health care 04/01/2021   Unilateral primary osteoarthritis, left knee 12/17/2018   Status post total left knee replacement 12/17/2018   Primary osteoarthritis of left knee 11/23/2018   Carpal tunnel syndrome, left upper limb 11/23/2018   BMI 40.0-44.9, adult (HCC) 07/12/2018   Primary osteoarthritis of both knees 12/24/2016   DDD (degenerative disc disease), thoracic 11/17/2016   DDD (degenerative disc disease), lumbar 11/17/2016   Vitamin D deficiency 11/17/2016   History of rosacea/ History of acne 11/17/2016   High risk medications (not anticoagulants) long-term use 06/25/2016   History of degenerative disc disease 06/25/2016   Severe obesity (BMI >= 40) (HCC) 06/13/2014   Left-sided thoracic back pain 07/19/2013   PALPITATIONS 10/17/2009   UTI 05/24/2009   Rheumatoid arthritis (HCC) 05/24/2009   Hair loss 02/22/2009   VAGINAL BLEEDING 10/05/2008   Carrier of group B Streptococcus 07/28/2007   GERD 12/28/2006   CARDIAC  MURMUR, HX OF 12/28/2006   HIATAL HERNIA, HX OF 12/28/2006    Past Medical History:  Diagnosis Date   Allergy    Arthritis    Carpal tunnel syndrome 10/2018   DDD (degenerative disc disease), lumbar    DDD (degenerative disc disease), thoracic    Family history of adverse reaction to anesthesia    Mothers sister never woke up from surgery   GERD (gastroesophageal reflux disease)    Grade I diastolic dysfunction 2013   Heart murmur    Heart palpitations    History of   History of hiatal hernia    OA (osteoarthritis)    Obese    Osteoporosis    Rheumatoid arthritis (HCC)    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    Vitamin D deficiency     Family History  Problem Relation Age of Onset   Uterine cancer Mother    Diabetes Father    Kidney cancer Father    Colon cancer Sister 64   Rectal cancer Sister    Colon cancer Paternal Grandfather    Colon polyps Brother    Cancer Sister        breast    Healthy Daughter    Esophageal cancer Neg Hx    Stomach cancer Neg Hx    Past Surgical History:  Procedure Laterality Date   COLONOSCOPY  2014   DB- normal    KNEE ARTHROPLASTY     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   Procedure: LEFT TOTAL KNEE ARTHROPLASTY;  Surgeon: 12/19/2018, MD;  Location: [facility];  Service: Orthopedics;  Laterality: Left;   UPPER GASTROINTESTINAL ENDOSCOPY     Social History   Social  History Narrative   Exercise--- sometimes walks 1x a week   Immunization History  Administered Date(s) Administered   Influenza Whole 02/08/2009   Influenza,inj,Quad PF,6+ Mos 03/09/2013, 06/13/2014, 06/16/2016, 02/14/2019, 03/30/2020, 04/01/2021   PFIZER(Purple Top)SARS-COV-2 Vaccination 08/26/2019, 09/27/2019, 05/30/2020   PNEUMOCOCCAL CONJUGATE-20 04/01/2021   Td 09/29/2001   Tdap 06/13/2014     Objective: Vital Signs: LMP 08/18/2018    Physical Exam   Musculoskeletal Exam: ***  CDAI Exam: CDAI Score: -- Patient Global: --; Provider Global: -- Swollen:  --; Tender: -- Joint Exam 04/29/2021   No joint exam has been documented for this visit   There is currently no information documented on the homunculus. Go to the Rheumatology activity and complete the homunculus joint exam.  Investigation: No additional findings.  Imaging: DG Chest 2 View  Result Date: 04/17/2021 CLINICAL DATA:  Chronic cough EXAM: CHEST - 2 VIEW COMPARISON:  Chest x-ray dated Oct 08, 2011 FINDINGS: Cardiac and mediastinal contours are unchanged. Low lung volumes. Small left pleural effusion. Bibasilar atelectasis. No evidence of pneumothorax. IMPRESSION: Small left pleural effusion and bibasilar atelectasis. Electronically Signed   By: Yetta Glassman M.D.   On: 04/17/2021 09:42    Recent Labs: Lab Results  Component Value Date   WBC 7.4 04/01/2021   HGB 12.7 04/01/2021   PLT 281.0 04/01/2021   NA 139 04/01/2021   K 4.4 04/01/2021   CL 103 04/01/2021   CO2 29 04/01/2021   GLUCOSE 81 04/01/2021   BUN 17 04/01/2021   CREATININE 0.54 04/01/2021   BILITOT 0.4 04/01/2021   ALKPHOS 76 04/01/2021   AST 22 04/01/2021   ALT 22 04/01/2021   PROT 7.1 04/01/2021   ALBUMIN 3.9 04/01/2021   CALCIUM 9.1 04/01/2021   GFRAA 114 10/12/2020   QFTBGOLDPLUS NEGATIVE 10/12/2020    Speciality Comments: Humira- quit working Simponi- inadequate response Orencia-since 07/2019 Reclast start 07/18/20  Procedures:  No procedures performed Allergies: Penicillins   Assessment / Plan:     Visit Diagnoses: No diagnosis found.  Orders: No orders of the defined types were placed in this encounter.  No orders of the defined types were placed in this encounter.   Face-to-face time spent with patient was *** minutes. Greater than 50% of time was spent in counseling and coordination of care.  Follow-Up Instructions: No follow-ups on file.   Earnestine Mealing, CMA  Note - This record has been created using Editor, commissioning.  Chart creation errors have been sought, but may  not always  have been located. Such creation errors do not reflect on  the standard of medical care.

## 2021-04-22 ENCOUNTER — Ambulatory Visit: Payer: BC Managed Care – PPO | Admitting: Internal Medicine

## 2021-04-22 ENCOUNTER — Encounter: Payer: Self-pay | Admitting: Internal Medicine

## 2021-04-22 ENCOUNTER — Other Ambulatory Visit: Payer: Self-pay

## 2021-04-22 DIAGNOSIS — J90 Pleural effusion, not elsewhere classified: Secondary | ICD-10-CM

## 2021-04-22 MED ORDER — RABEPRAZOLE SODIUM 20 MG PO TBEC: DELAYED_RELEASE_TABLET | ORAL | 2 refills | Status: DC

## 2021-04-22 NOTE — Progress Notes (Signed)
Donna Lopez, female    DOB: 1965/07/10,    MRN: [id number]   Brief patient profile:  16   yowf never smoker with RA since 1990s maint on MTX  referred to pulmonary clinic in [facility]  04/22/2021 by Hazel Sams PA with Dr Estanislado Pandy with initially good response to Humira for about 11 years but stopped worked in 2020 and tried several other options but in July 2022 started rinvoq daily and cough started w/in a month> cxr c/w L effusion though RA better than usual.       History of Present Illness  04/22/2021  Pulmonary/ 1st office eval/ [employer] / [employer]  Chief Complaint  Patient presents with   Consult    Pleural Effusion-   Dyspnea:  MMRC2 = can't walk a nl pace on a flat grade s sob but does fine slow and flat  Cough: first thing in am and after supper assoc with overt gerd on nexium 40 mg p bfast  Sleep: flat in bed  SABA use: no inhaler   No obvious day to day or daytime variability or assoc excess/ purulent sputum or mucus plugs or hemoptysis or cp or chest tightness, subjective wheeze or overt sinus or hb symptoms.   Sleeping  without nocturnal  or early am exacerbation  of respiratory  c/o's or need for noct saba. Also denies any obvious fluctuation of symptoms with weather or environmental changes or other aggravating or alleviating factors except as outlined above   No unusual exposure hx or h/o childhood pna/ asthma or knowledge of premature birth.  Current Allergies, Complete Past Medical History, Past Surgical History, Family History, and Social History were reviewed in [employer] record.  ROS  The following are not active complaints unless bolded Hoarseness, sore throat, dysphagia, dental problems, itching, sneezing,  nasal congestion or discharge of excess mucus or purulent secretions, ear ache,   fever, chills, sweats, unintended wt loss or wt gain, classically pleuritic or exertional cp,  orthopnea pnd or arm/hand swelling  or leg  swelling, presyncope, palpitations, abdominal pain, anorexia, nausea, vomiting, diarrhea  or change in bowel habits or change in bladder habits, change in stools or change in urine, dysuria, hematuria,  rash, arthralgias, visual complaints, headache, numbness, weakness or ataxia or problems with walking or coordination,  change in mood or  memory.           Past Medical History:  Diagnosis Date   Allergy    Arthritis    Carpal tunnel syndrome 10/2018   DDD (degenerative disc disease), lumbar    DDD (degenerative disc disease), thoracic    Family history of adverse reaction to anesthesia    Mothers sister never woke up from surgery   GERD (gastroesophageal reflux disease)    Grade I diastolic dysfunction 0000000   Heart murmur    Heart palpitations    History of   History of hiatal hernia    OA (osteoarthritis)    Obese    Osteoporosis    Rheumatoid arthritis ([place])    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    Vitamin D deficiency     Outpatient Medications Prior to Visit  Medication Sig Dispense Refill   acyclovir cream (ZOVIRAX) 5 % Apply 1 application topically every 3 (three) hours. 15 g 2   aspirin 81 MG chewable tablet Chew 1 tablet (81 mg total) by mouth 2 (two) times daily. (Patient taking differently: Chew 81 mg by mouth daily.) 30 tablet 0  Calcium Carb-Cholecalciferol (CALCIUM + D3) 600-200 MG-UNIT TABS Take 1 capsule by mouth daily.     Cholecalciferol (VITAMIN D) 50 MCG (2000 UT) CAPS Take 2,000 Units by mouth daily.     esomeprazole (NEXIUM) 40 MG capsule Take 1 capsule (40 mg total) by mouth daily. 90 capsule 3   folic acid (FOLVITE) 1 MG tablet TAKE 2 TABLETS BY MOUTH EVERY DAY 180 tablet 3   Glucos-Chondroit-Hyaluron-MSM (GLUCOSAMINE CHONDROITIN JOINT PO) Take 1 tablet by mouth daily.     L-Lysine 1000 MG TABS Take 1,000 mg by mouth daily.      Methotrexate, PF, (OTREXUP) 25 MG/0.4ML SOAJ INJECT 25 MG INTO THE SKIN EVERY 7 DAYS. 4.8 mL 0   Upadacitinib ER (RINVOQ) 15 MG TB24 Take 1  tablet (15 mg) by mouth daily. 30 tablet 2   valACYclovir (VALTREX) 1000 MG tablet TAKE 2 TABLETS BY MOUTH AS A SINGLE DOSE AT FIRST SIGN OF COLD SORE THEN REPEAT IN 12 HOURS 90 tablet 0   No facility-administered medications prior to visit.     Objective:     BP 120/78 (BP Location: Left Arm, Patient Position: Sitting, Cuff Size: Normal)   Pulse (!) 102   Temp (!) 97.3 F (36.3 C) (Oral)   Ht 5\' 2"  (1.575 m)   Wt 238 lb 3.2 oz (108 kg)   LMP 08/18/2018   SpO2 96%   BMI 43.57 kg/m   SpO2: 96 %  Amb wf nad   HEENT : pt wearing mask not removed for exam due to covid -19 concerns.    NECK :  without JVD/Nodes/TM/ nl carotid upstrokes bilaterally   LUNGS: no acc muscle use,  Nl contour chest with decreased bs and dullness L side about lower 1/3  without cough on insp or exp maneuvers   CV:  RRR  no s3 or murmur or increase in P2, and no edema   ABD:  soft and nontender with nl inspiratory excursion in the supine position. No bruits or organomegaly appreciated, bowel sounds nl  MS:  Nl gait/ ext warm with mild  A changes in hands  scalf tenderness, cyanosis or clubbing    SKIN: warm and dry without lesions    NEURO:  alert, approp, nl sensorium with  no motor or cerebellar deficits apparent.      I personally reviewed images and agree with radiology impression as follows:  CXR:   pa and lateral 04/16/21  Small left pleural effusion and bibasilar atelectasis.     Assessment   Pleural effusion on left Assoc with cough onset w/in a month of starting rinvoq but no baseline cxr available so duration not clear - [place] t centesis scheduled  There are many RA manifestations in the chest and this is certainly one of them but unclear whether this is related to RA or RA drugs so best option is to tap it under u/s to find out and help guide rx   She has bad gerd which may be the cause of the cough or can be aggravated by cough from the effusion - hard to tell   rec Try  acihex 20 mg bid and diet/ lifestyle changes see avs for instructions unique to this ov   Proceed with [place] tcentesis for dx purposes and see if eliminating the fluid helps with the cough.   Discussed in detail all the  indications, usual  risks and alternatives  relative to the benefits with patient who agrees to proceed with w/u as  outlined.             Each maintenance medication was reviewed in detail including emphasizing most importantly the difference between maintenance and prns and under what circumstances the prns are to be triggered using an action plan format where appropriate.  Total time for H and P, chart review, counseling,   and generating customized AVS unique to this initial  office visit / same day charting = 48 min          Sandrea Hughs, MD 04/22/2021

## 2021-04-22 NOTE — Patient Instructions (Addendum)
Try aciphex 20 mg  Take 30- 60 min before your first and last meals of the day   GERD (REFLUX)  is an extremely common cause of respiratory symptoms just like yours , many times with no obvious heartburn at all.    It can be treated with medication, but also with lifestyle changes including elevation of the head of your bed (ideally with 6 -8inch blocks under the headboard of your bed),  Smoking cessation, avoidance of late meals, excessive alcohol, and avoid fatty foods, chocolate, peppermint, colas, red wine, and acidic juices such as orange juice.  NO MINT OR MENTHOL PRODUCTS SO NO COUGH DROPS  USE SUGARLESS CANDY INSTEAD (Jolley ranchers or Stover's or Life Savers) or even ice chips will also do - the key is to swallow to prevent all throat clearing. NO OIL BASED VITAMINS - use powdered substitutes.  Avoid fish oil when coughing.    We will arrange for you to have ultrasound guided thoracentesis and I will call you with the results

## 2021-04-23 ENCOUNTER — Encounter: Payer: Self-pay | Admitting: Internal Medicine

## 2021-04-23 ENCOUNTER — Telehealth: Payer: Self-pay | Admitting: Pharmacy Technician

## 2021-04-23 ENCOUNTER — Other Ambulatory Visit (HOSPITAL_COMMUNITY): Payer: Self-pay

## 2021-04-23 ENCOUNTER — Ambulatory Visit
Admission: RE | Admit: 2021-04-23 | Discharge: 2021-04-23 | Disposition: A | Discharge status: Home / Self Care | Payer: BC Managed Care – PPO | Source: Ambulatory Visit | Attending: Family Medicine | Admitting: Family Medicine

## 2021-04-23 DIAGNOSIS — Z1231 Encounter for screening mammogram for malignant neoplasm of breast: Secondary | ICD-10-CM | POA: Diagnosis not present

## 2021-04-23 NOTE — Assessment & Plan Note (Signed)
Assoc with cough onset w/in a month of starting rinvoq but no baseline cxr available so duration not clear - [place] t centesis scheduled  There are many RA manifestations in the chest and this is certainly one of them but unclear whether this is related to RA or RA drugs so best option is to tap it under u/s to find out and help guide rx   She has bad gerd which may be the cause of the cough or can be aggravated by cough from the effusion - hard to tell   rec Try acihex 20 mg bid and diet/ lifestyle changes see avs for instructions unique to this ov   Proceed with [place] tcentesis for dx purposes and see if eliminating the fluid helps with the cough.   Discussed in detail all the  indications, usual  risks and alternatives  relative to the benefits with patient who agrees to proceed with w/u as outlined.             Each maintenance medication was reviewed in detail including emphasizing most importantly the difference between maintenance and prns and under what circumstances the prns are to be triggered using an action plan format where appropriate.  Total time for H and P, chart review, counseling,   and generating customized AVS unique to this initial  office visit / same day charting = 48 min

## 2021-04-23 NOTE — Telephone Encounter (Signed)
Patient Advocate Encounter  Prior Authorization for Rabeprazole has been approved.    Effective dates: 04/23/21 through 04/22/22  Per Test Claim Patients co-pay is PA hasn't been updated yet.

## 2021-04-23 NOTE — Telephone Encounter (Signed)
Patient Advocate Encounter   Received notification from CoverMyMEds that prior authorization for Rabeprazole is required by his/her insurance [employer] [place].   PA submitted on 04/23/21 Key [id number]  Status is pending    [facility] will continue to follow:  Patient Advocate Fax:  [phone]

## 2021-04-23 NOTE — Telephone Encounter (Signed)
Spoke with pharmacy to process.   Copay was $49.75 for 3 month supply. They will have to order and will contact pt.

## 2021-04-23 NOTE — Telephone Encounter (Signed)
error 

## 2021-04-25 ENCOUNTER — Other Ambulatory Visit (HOSPITAL_COMMUNITY): Payer: Self-pay

## 2021-04-25 ENCOUNTER — Other Ambulatory Visit: Payer: Self-pay | Admitting: Radiology

## 2021-04-25 ENCOUNTER — Ambulatory Visit
Admission: RE | Admit: 2021-04-25 | Discharge: 2021-04-25 | Disposition: A | Discharge status: Home / Self Care | Payer: BC Managed Care – PPO | Source: Ambulatory Visit | Attending: Internal Medicine | Admitting: Internal Medicine

## 2021-04-25 ENCOUNTER — Telehealth: Payer: Self-pay | Admitting: Internal Medicine

## 2021-04-25 ENCOUNTER — Ambulatory Visit
Admission: RE | Admit: 2021-04-25 | Discharge: 2021-04-25 | Disposition: A | Discharge status: Home / Self Care | Payer: BC Managed Care – PPO | Source: Ambulatory Visit | Attending: Radiology | Admitting: Radiology

## 2021-04-25 DIAGNOSIS — Z9889 Other specified postprocedural states: Secondary | ICD-10-CM

## 2021-04-25 DIAGNOSIS — M069 Rheumatoid arthritis, unspecified: Secondary | ICD-10-CM | POA: Diagnosis not present

## 2021-04-25 DIAGNOSIS — J90 Pleural effusion, not elsewhere classified: Secondary | ICD-10-CM | POA: Diagnosis not present

## 2021-04-25 DIAGNOSIS — R0600 Dyspnea, unspecified: Secondary | ICD-10-CM | POA: Insufficient documentation

## 2021-04-25 DIAGNOSIS — R509 Fever, unspecified: Secondary | ICD-10-CM | POA: Diagnosis not present

## 2021-04-25 DIAGNOSIS — J9811 Atelectasis: Secondary | ICD-10-CM | POA: Diagnosis not present

## 2021-04-25 DIAGNOSIS — J918 Pleural effusion in other conditions classified elsewhere: Secondary | ICD-10-CM | POA: Diagnosis not present

## 2021-04-25 LAB — BODY FLUID CELL COUNT WITH DIFFERENTIAL
Eos, Fluid: 0 %
Lymphs, Fluid: 34 %
Monocyte-Macrophage-Serous Fluid: 57 %
Neutrophil Count, Fluid: 9 %
Other Cells, Fluid: 0 %
Total Nucleated Cell Count, Fluid: 392 cu mm

## 2021-04-25 LAB — ALBUMIN, PLEURAL OR PERITONEAL FLUID: Albumin, Fluid: 2.5 g/dL

## 2021-04-25 LAB — LACTATE DEHYDROGENASE, PLEURAL OR PERITONEAL FLUID: LD, Fluid: 1246 U/L — ABNORMAL HIGH (ref 3–23)

## 2021-04-25 LAB — GLUCOSE, PLEURAL OR PERITONEAL FLUID: Glucose, Fluid: 32 mg/dL

## 2021-04-25 LAB — PROTEIN, PLEURAL OR PERITONEAL FLUID: Total protein, fluid: 4.5 g/dL

## 2021-04-25 LAB — AMYLASE, PLEURAL OR PERITONEAL FLUID: Amylase, Fluid: 23 U/L

## 2021-04-25 NOTE — Telephone Encounter (Signed)
Noted.  Will close encounter.  

## 2021-04-25 NOTE — Telephone Encounter (Signed)
Received call from [facility]- Critical Glucose of 32 from labs dated 04/25/21   Forwarding to MW to make him aware

## 2021-04-25 NOTE — Telephone Encounter (Signed)
I believe that is from the T centesis so please disregard

## 2021-04-25 NOTE — Procedures (Signed)
PROCEDURE SUMMARY:  Successful US guided left thoracentesis. Yielded 200 mL of yellow colored fluid with debris within fluid. Pt tolerated procedure well. No immediate complications.  Specimen was sent for labs. CXR ordered.  EBL < 1 mL  Pattricia Boss D PA-C 04/25/2021 2:00 PM

## 2021-04-26 LAB — TRIGLYCERIDES, BODY FLUIDS: Triglycerides, Fluid: 34 mg/dL

## 2021-04-29 ENCOUNTER — Encounter: Payer: Self-pay | Admitting: Internal Medicine

## 2021-04-29 ENCOUNTER — Ambulatory Visit: Payer: BC Managed Care – PPO | Admitting: Physician Assistant

## 2021-04-29 DIAGNOSIS — R053 Chronic cough: Secondary | ICD-10-CM

## 2021-04-29 DIAGNOSIS — Z8639 Personal history of other endocrine, nutritional and metabolic disease: Secondary | ICD-10-CM

## 2021-04-29 DIAGNOSIS — Z96652 Presence of left artificial knee joint: Secondary | ICD-10-CM

## 2021-04-29 DIAGNOSIS — Z79899 Other long term (current) drug therapy: Secondary | ICD-10-CM

## 2021-04-29 DIAGNOSIS — Z8719 Personal history of other diseases of the digestive system: Secondary | ICD-10-CM

## 2021-04-29 DIAGNOSIS — Z872 Personal history of diseases of the skin and subcutaneous tissue: Secondary | ICD-10-CM

## 2021-04-29 DIAGNOSIS — M810 Age-related osteoporosis without current pathological fracture: Secondary | ICD-10-CM

## 2021-04-29 DIAGNOSIS — M5136 Other intervertebral disc degeneration, lumbar region: Secondary | ICD-10-CM

## 2021-04-29 DIAGNOSIS — M0579 Rheumatoid arthritis with rheumatoid factor of multiple sites without organ or systems involvement: Secondary | ICD-10-CM

## 2021-04-29 DIAGNOSIS — M5134 Other intervertebral disc degeneration, thoracic region: Secondary | ICD-10-CM

## 2021-04-29 DIAGNOSIS — M1711 Unilateral primary osteoarthritis, right knee: Secondary | ICD-10-CM

## 2021-04-29 LAB — CYTOLOGY - NON PAP

## 2021-04-29 NOTE — Telephone Encounter (Signed)
She will need to continue to hold both rinvoq and MTX until her current infection has completely cleared.  Please advise the patient to reach out to Dr. Sherene Sires and her PCP regarding her current symptoms.  She will require clearance prior to restarting a new medication. Once her infection has cleared she will need to return to further discuss other treatment options.

## 2021-04-29 NOTE — Telephone Encounter (Signed)
had a thoracentesis last Thursday, on Friday I had a fever and a bad headache.  That continued throughout the weekend, along with a dry cough.  Home covid test was negative, I did not get an official test.  Feeling much better today but wondering (hoping) if whatever I came down with on Friday might have affected the fluid testing that was done on Thursday.  Dr. Sherene Sires, please advise. thanks

## 2021-04-30 NOTE — Progress Notes (Signed)
Is it okay to start patient back on Rinvoq and methotrexate?  I assume that there is no infection based on the work-up.

## 2021-04-30 NOTE — Progress Notes (Signed)
Patient is running a fever now.  Do you expect fever after thoracentesis?  Does she need to be evaluated by the PCP to rule out infection?

## 2021-05-01 ENCOUNTER — Other Ambulatory Visit (HOSPITAL_COMMUNITY): Payer: Self-pay

## 2021-05-01 ENCOUNTER — Telehealth: Payer: Self-pay | Admitting: Rheumatology

## 2021-05-01 NOTE — Telephone Encounter (Signed)
Patient called office and cancelled appointment. Patient requests return call tomorrow (05/02/21).

## 2021-05-01 NOTE — Progress Notes (Deleted)
Office Visit Note  Patient: Donna Lopez             Date of Birth: June 11, 1965           MRN: [id number]             PCP: Donato Schultz, DO Referring: Donato Schultz, * Visit Date: 05/02/2021 Occupation: @GUAROCC @  Subjective:  No chief complaint on file.   History of Present Illness: Donna Lopez is a 55 y.o. female ***   Activities of Daily Living:  Patient reports morning stiffness for *** {minute/hour:19697}.   Patient {ACTIONS;DENIES/REPORTS:21021675::"Denies"} nocturnal pain.  Difficulty dressing/grooming: {ACTIONS;DENIES/REPORTS:21021675::"Denies"} Difficulty climbing stairs: {ACTIONS;DENIES/REPORTS:21021675::"Denies"} Difficulty getting out of chair: {ACTIONS;DENIES/REPORTS:21021675::"Denies"} Difficulty using hands for taps, buttons, cutlery, and/or writing: {ACTIONS;DENIES/REPORTS:21021675::"Denies"}  No Rheumatology ROS completed.   PMFS History:  Patient Active Problem List   Diagnosis Date Noted   Pleural effusion on left 04/22/2021   Preventative health care 04/01/2021   Unilateral primary osteoarthritis, left knee 12/17/2018   Status post total left knee replacement 12/17/2018   Primary osteoarthritis of left knee 11/23/2018   Carpal tunnel syndrome, left upper limb 11/23/2018   BMI 40.0-44.9, adult (HCC) 07/12/2018   Primary osteoarthritis of both knees 12/24/2016   DDD (degenerative disc disease), thoracic 11/17/2016   DDD (degenerative disc disease), lumbar 11/17/2016   Vitamin D deficiency 11/17/2016   History of rosacea/ History of acne 11/17/2016   High risk medications (not anticoagulants) long-term use 06/25/2016   History of degenerative disc disease 06/25/2016   Severe obesity (BMI >= 40) (HCC) 06/13/2014   Left-sided thoracic back pain 07/19/2013   PALPITATIONS 10/17/2009   UTI 05/24/2009   Rheumatoid arthritis (HCC) 05/24/2009   Hair loss 02/22/2009   VAGINAL BLEEDING 10/05/2008   Carrier of group B Streptococcus  07/28/2007   GERD 12/28/2006   CARDIAC MURMUR, HX OF 12/28/2006   HIATAL HERNIA, HX OF 12/28/2006    Past Medical History:  Diagnosis Date   Allergy    Arthritis    Carpal tunnel syndrome 10/2018   DDD (degenerative disc disease), lumbar    DDD (degenerative disc disease), thoracic    Family history of adverse reaction to anesthesia    Mothers sister never woke up from surgery   GERD (gastroesophageal reflux disease)    Grade I diastolic dysfunction 2013   Heart murmur    Heart palpitations    History of   History of hiatal hernia    OA (osteoarthritis)    Obese    Osteoporosis    Rheumatoid arthritis (HCC)    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    Vitamin D deficiency     Family History  Problem Relation Age of Onset   Uterine cancer Mother    Diabetes Father    Kidney cancer Father    Colon cancer Sister 45   Rectal cancer Sister    Colon cancer Paternal Grandfather    Colon polyps Brother    Cancer Sister        breast    Healthy Daughter    Esophageal cancer Neg Hx    Stomach cancer Neg Hx    Past Surgical History:  Procedure Laterality Date   COLONOSCOPY  2014   DB- normal    KNEE ARTHROPLASTY     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   Procedure: LEFT TOTAL KNEE ARTHROPLASTY;  Surgeon: Kathryne Hitch, MD;  Location: [facility];  Service: Orthopedics;  Laterality: Left;   UPPER GASTROINTESTINAL ENDOSCOPY  Social History   Social History Narrative   Exercise--- sometimes walks 1x a week   Immunization History  Administered Date(s) Administered   Influenza Whole 02/08/2009   Influenza,inj,Quad PF,6+ Mos 03/09/2013, 06/13/2014, 06/16/2016, 02/14/2019, 03/30/2020, 04/01/2021   PFIZER(Purple Top)SARS-COV-2 Vaccination 08/26/2019, 09/27/2019, 05/30/2020   PNEUMOCOCCAL CONJUGATE-20 04/01/2021   Td 09/29/2001   Tdap 06/13/2014     Objective: Vital Signs: LMP 08/18/2018    Physical Exam   Musculoskeletal Exam: ***  CDAI Exam: CDAI Score: -- Patient  Global: --; Provider Global: -- Swollen: --; Tender: -- Joint Exam 05/02/2021   No joint exam has been documented for this visit   There is currently no information documented on the homunculus. Go to the Rheumatology activity and complete the homunculus joint exam.  Investigation: No additional findings.  Imaging: DG Chest 2 View  Result Date: 04/25/2021 CLINICAL DATA:  LEFT thoracentesis. EXAM: PORTABLE CHEST 1 VIEW COMPARISON:  Chest XR, 04/16/2021. And 10/08/2011. CT chest, 07/19/2004. FINDINGS: Cardiomediastinal silhouette is within normal limits. Lungs are hypoinflated. Residual small volume LEFT pleural effusion. Blunting of the LEFT costophrenic angle with streaky and patchy LEFT basilar opacities. The RIGHT lung is clear. No pneumothorax. No acute displaced fracture. IMPRESSION: 1. No postprocedure pneumothorax 2. Small volume residual LEFT pleural effusion with adjacent basilar atelectasis. Electronically Signed   By: Michaelle Birks M.D.   On: 04/25/2021 14:20   DG Chest 2 View  Result Date: 04/17/2021 CLINICAL DATA:  Chronic cough EXAM: CHEST - 2 VIEW COMPARISON:  Chest x-ray dated Oct 08, 2011 FINDINGS: Cardiac and mediastinal contours are unchanged. Low lung volumes. Small left pleural effusion. Bibasilar atelectasis. No evidence of pneumothorax. IMPRESSION: Small left pleural effusion and bibasilar atelectasis. Electronically Signed   By: Yetta Glassman M.D.   On: 04/17/2021 09:42   MM 3D SCREEN BREAST BILATERAL  Result Date: 04/23/2021 CLINICAL DATA:  Screening. EXAM: DIGITAL SCREENING BILATERAL MAMMOGRAM WITH TOMOSYNTHESIS AND CAD TECHNIQUE: Bilateral screening digital craniocaudal and mediolateral oblique mammograms were obtained. Bilateral screening digital breast tomosynthesis was performed. The images were evaluated with computer-aided detection. COMPARISON:  Previous exam(s). ACR Breast Density Category a: The breast tissue is almost entirely fatty. FINDINGS: There are no  findings suspicious for malignancy. IMPRESSION: No mammographic evidence of malignancy. A result letter of this screening mammogram will be mailed directly to the patient. RECOMMENDATION: Screening mammogram in one year. (Code:SM-B-01Y) BI-RADS CATEGORY  1: Negative. Electronically Signed   By: Marin Olp M.D.   On: 04/23/2021 12:32   US THORACENTESIS ASP PLEURAL SPACE W/IMG GUIDE  Result Date: 04/25/2021 INDICATION: Patient with rheumatoid arthritis and new rheumatoid medicine now with complaints of cough and a left pleural effusion was seen request received for diagnostic and therapeutic thoracentesis. EXAM: ULTRASOUND GUIDED LEFT THORACENTESIS MEDICATIONS: Local 1% lidocaine only. COMPLICATIONS: None immediate. PROCEDURE: An ultrasound guided thoracentesis was thoroughly discussed with the patient and questions answered. The benefits, risks, alternatives and complications were also discussed. The patient understands and wishes to proceed with the procedure. Written consent was obtained. Ultrasound was performed to localize and mark an adequate pocket of fluid in the left chest. The area was then prepped and draped in the normal sterile fashion. 1% Lidocaine was used for local anesthesia. Under ultrasound guidance a 19 gauge, 10-cm, Yueh catheter was introduced. Thoracentesis was performed. The catheter was removed and a dressing applied. FINDINGS: A total of approximately 200 mL of yellow-colored with debris within fluid was removed. Samples were sent to the laboratory as requested by the  clinical team. IMPRESSION: Successful ultrasound guided left thoracentesis yielding 200 mL of pleural fluid. This exam was performed by Pattricia Boss PA-C, and was supervised and interpreted by Dr. Milford Cage. Electronically Signed   By: Roanna Banning M.D.   On: 04/25/2021 14:23    Recent Labs: Lab Results  Component Value Date   WBC 7.4 04/01/2021   HGB 12.7 04/01/2021   PLT 281.0 04/01/2021   NA 139 04/01/2021   K  4.4 04/01/2021   CL 103 04/01/2021   CO2 29 04/01/2021   GLUCOSE 81 04/01/2021   BUN 17 04/01/2021   CREATININE 0.54 04/01/2021   BILITOT 0.4 04/01/2021   ALKPHOS 76 04/01/2021   AST 22 04/01/2021   ALT 22 04/01/2021   PROT 7.1 04/01/2021   ALBUMIN 3.9 04/01/2021   CALCIUM 9.1 04/01/2021   GFRAA 114 10/12/2020   QFTBGOLDPLUS NEGATIVE 10/12/2020    Speciality Comments: Humira- quit working Simponi- inadequate response Orencia-since 07/2019 Reclast start 07/18/20  Procedures:  No procedures performed Allergies: Penicillins   Assessment / Plan:     Visit Diagnoses: No diagnosis found.  Orders: No orders of the defined types were placed in this encounter.  No orders of the defined types were placed in this encounter.   Face-to-face time spent with patient was *** minutes. Greater than 50% of time was spent in counseling and coordination of care.  Follow-Up Instructions: No follow-ups on file.   Ellen Henri, CMA  Note - This record has been created using Animal nutritionist.  Chart creation errors have been sought, but may not always  have been located. Such creation errors do not reflect on  the standard of medical care.

## 2021-05-02 ENCOUNTER — Telehealth: Payer: Self-pay

## 2021-05-02 ENCOUNTER — Ambulatory Visit: Payer: BC Managed Care – PPO | Admitting: Physician Assistant

## 2021-05-02 NOTE — Telephone Encounter (Signed)
Patient called stating she was returning Taylor's call.  Patient states she will be in a meeting until 12:00 pm today.

## 2021-05-02 NOTE — Telephone Encounter (Signed)
I spoke with Dr. Corliss Skains.  She recommends restarting Methotrexate and continuing to hold rinvoq for 1 more week.   Ok to reschedule the patient on my schedule next week if she would like to return to further discuss anything.

## 2021-05-02 NOTE — Telephone Encounter (Signed)
Attempted to contact the patient and left message for patient to call the office.  

## 2021-05-02 NOTE — Telephone Encounter (Signed)
I returned the patients call to discuss her current symptoms.  She initially noticed improvement in her cough after undergoing the thoracentesis but then she developed covid-19.  She is no longer experiencing a fever or headache.  Her fever lasted 3.5 days and has not returned since Monday.  She found out that she was exposed to covid while at her mother's funeral 04/20/21.   She has been coughing "on occasion."   She continues to hold rinvoq and methotrexate as advised.   She is currently have a flare in both knees.  She is having a difficult time walking. 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   She would like to restart combination therapy if possible

## 2021-05-03 NOTE — Telephone Encounter (Signed)
Patient advised Donna Lopez spoke with Dr. Corliss Skains.  Advised patient Dr. Corliss Skains recommends restarting Methotrexate and continuing to hold rinvoq for 1 more week.   Ok to reschedule the patient on my schedule next week if she would like to return to further discuss anything. Patient scheduled for 05/08/2021 at 9:40 am.

## 2021-05-07 ENCOUNTER — Other Ambulatory Visit (HOSPITAL_COMMUNITY): Payer: Self-pay

## 2021-05-07 DIAGNOSIS — F4323 Adjustment disorder with mixed anxiety and depressed mood: Secondary | ICD-10-CM | POA: Diagnosis not present

## 2021-05-08 ENCOUNTER — Ambulatory Visit: Payer: BC Managed Care – PPO | Admitting: Physician Assistant

## 2021-05-09 ENCOUNTER — Other Ambulatory Visit (HOSPITAL_COMMUNITY): Payer: Self-pay

## 2021-05-09 ENCOUNTER — Other Ambulatory Visit: Payer: Self-pay | Admitting: Physician Assistant

## 2021-05-09 DIAGNOSIS — M0579 Rheumatoid arthritis with rheumatoid factor of multiple sites without organ or systems involvement: Secondary | ICD-10-CM

## 2021-05-09 MED ORDER — OTREXUP 25 MG/0.4ML ~~LOC~~ SOAJ
SUBCUTANEOUS | 0 refills | Status: DC
  Filled 2021-05-09: qty 1.6, 28d supply, fill #0
  Filled 2021-06-07: qty 1.6, 28d supply, fill #1
  Filled 2021-07-05: qty 1.6, 28d supply, fill #2

## 2021-05-09 MED ORDER — RINVOQ 15 MG PO TB24
15.0000 mg | ORAL_TABLET | Freq: Every day | ORAL | 2 refills | Status: DC
  Filled 2021-05-09: qty 30, 30d supply, fill #0
  Filled 2021-06-07: qty 30, 30d supply, fill #1
  Filled 2021-07-05: qty 30, 30d supply, fill #2

## 2021-05-09 NOTE — Telephone Encounter (Signed)
Next Visit: 06/17/2021  Last Visit: 04/16/2021   DX: Rheumatoid arthritis involving multiple sites with positive rheumatoid factor   Current Dose per office note 04/16/2021:  Rinvoq 15 mg 1 tablet by mouth daily. methotrexate 25 mg subcutaneous injections once weekly   Labs: 04/01/2021 RDW 16.8,   TB Gold: 10/12/2020 Neg   Verified verbal prescription with Dr. Corliss Skains to send in  Rinvoq 15 mg 1 tablet by mouth daily. methotrexate 25 mg subcutaneous injections once weekly

## 2021-05-10 ENCOUNTER — Other Ambulatory Visit (HOSPITAL_COMMUNITY): Payer: Self-pay

## 2021-05-14 ENCOUNTER — Other Ambulatory Visit (HOSPITAL_COMMUNITY): Payer: Self-pay

## 2021-05-15 ENCOUNTER — Other Ambulatory Visit (HOSPITAL_COMMUNITY): Payer: Self-pay

## 2021-05-16 ENCOUNTER — Other Ambulatory Visit (HOSPITAL_COMMUNITY): Payer: Self-pay

## 2021-05-30 DIAGNOSIS — F4323 Adjustment disorder with mixed anxiety and depressed mood: Secondary | ICD-10-CM | POA: Diagnosis not present

## 2021-06-07 ENCOUNTER — Other Ambulatory Visit (HOSPITAL_COMMUNITY): Payer: Self-pay

## 2021-06-10 ENCOUNTER — Other Ambulatory Visit (HOSPITAL_COMMUNITY): Payer: Self-pay

## 2021-06-12 ENCOUNTER — Other Ambulatory Visit (HOSPITAL_COMMUNITY): Payer: Self-pay

## 2021-06-13 ENCOUNTER — Other Ambulatory Visit (HOSPITAL_COMMUNITY): Payer: Self-pay

## 2021-06-17 ENCOUNTER — Other Ambulatory Visit: Payer: Self-pay

## 2021-06-17 ENCOUNTER — Ambulatory Visit: Payer: BC Managed Care – PPO | Admitting: Physician Assistant

## 2021-06-17 ENCOUNTER — Encounter: Payer: Self-pay | Admitting: Physician Assistant

## 2021-06-17 VITALS — BP 120/82 | HR 87 | Ht 62.0 in | Wt 239.0 lb

## 2021-06-17 DIAGNOSIS — Z7689 Persons encountering health services in other specified circumstances: Secondary | ICD-10-CM

## 2021-06-17 DIAGNOSIS — M5136 Other intervertebral disc degeneration, lumbar region: Secondary | ICD-10-CM

## 2021-06-17 DIAGNOSIS — J90 Pleural effusion, not elsewhere classified: Secondary | ICD-10-CM | POA: Diagnosis not present

## 2021-06-17 DIAGNOSIS — Z79899 Other long term (current) drug therapy: Secondary | ICD-10-CM | POA: Diagnosis not present

## 2021-06-17 DIAGNOSIS — Z8639 Personal history of other endocrine, nutritional and metabolic disease: Secondary | ICD-10-CM

## 2021-06-17 DIAGNOSIS — M0579 Rheumatoid arthritis with rheumatoid factor of multiple sites without organ or systems involvement: Secondary | ICD-10-CM

## 2021-06-17 DIAGNOSIS — M5134 Other intervertebral disc degeneration, thoracic region: Secondary | ICD-10-CM

## 2021-06-17 DIAGNOSIS — M1711 Unilateral primary osteoarthritis, right knee: Secondary | ICD-10-CM

## 2021-06-17 DIAGNOSIS — Z96652 Presence of left artificial knee joint: Secondary | ICD-10-CM

## 2021-06-17 DIAGNOSIS — R0689 Other abnormalities of breathing: Secondary | ICD-10-CM

## 2021-06-17 DIAGNOSIS — Z8719 Personal history of other diseases of the digestive system: Secondary | ICD-10-CM

## 2021-06-17 DIAGNOSIS — Z872 Personal history of diseases of the skin and subcutaneous tissue: Secondary | ICD-10-CM

## 2021-06-17 DIAGNOSIS — Z111 Encounter for screening for respiratory tuberculosis: Secondary | ICD-10-CM

## 2021-06-17 DIAGNOSIS — M810 Age-related osteoporosis without current pathological fracture: Secondary | ICD-10-CM

## 2021-06-17 DIAGNOSIS — R053 Chronic cough: Secondary | ICD-10-CM

## 2021-06-17 MED ORDER — PREDNISONE 5 MG PO TABS: ORAL_TABLET | ORAL | 0 refills | Status: DC

## 2021-06-17 NOTE — Telephone Encounter (Signed)
Per Hazel Sams, PA-C, pending lab results please send in a prescription for plaquenil BID Monday through Friday. Thanks!

## 2021-06-17 NOTE — Addendum Note (Signed)
Addended by: Geroge Baseman C on: 06/17/2021 11:40 AM   Modules accepted: Orders

## 2021-06-17 NOTE — Progress Notes (Signed)
Office Visit Note  Patient: Donna Lopez             Date of Birth: 1965/09/08           MRN: [id number]             PCP: [name], DO Referring: [name], * Visit Date: 06/17/2021 Occupation: @GUAROCC @  Subjective:  Recurrence of cough   History of Present Illness: Donna Lopez is a 56 y.o. female with history of rheumatoid arthritis and ostearthritis.  Patient was last seen in the office on 04/16/2021.  At that time she was experiencing a daily cough.  She was sent for chest x-ray on 04/16/2021 which revealed a small left pleural effusion.  She was referred to [facility] pulmonology at that time.  She underwent a thoracentesis on 04/25/2021 for drainage of a left pleural effusion.  She was off of methotrexate from 04/11/21 to 05/02/21.  She restarted rinvoq on 05/09/2021.  The cough initially improved after undergoing a thoracentesis but the cough is since returned in early January 2023. She is not experiencing any fevers, pleuritic chest pain, SOB, orthopnea, or palpitations. The cough has been worse in the mornings and has been productive with yellow discharge.   While off of methotrexate rinvoq she had one of the worst flares she has ever had.  She was experiencing pain in almost every joint including both shoulders, elbows, wrists, hands, both knees, and both feet. She also had tenderness and pain in both TMJs.  She was also experiencing sores in her mouth at that time.  Since restarting therapy her joint pain and inflammation has improved.  She is back to her baseline with pain in both hands and both feet but with minimal inflammation.  She has some ongoing pain in the left shoulder, which has made it difficult to sleep at night. She has been taking aleve sparingly for symptomatic relief.    Activities of Daily Living:  Patient reports morning stiffness for 30 minutes.   Patient Denies nocturnal pain.  Difficulty dressing/grooming: Denies Difficulty climbing  stairs: Reports Difficulty getting out of chair: Reports Difficulty using hands for taps, buttons, cutlery, and/or writing: Reports  Review of Systems  Constitutional:  Positive for fatigue.  HENT:  Negative for mouth sores, mouth dryness and nose dryness.   Eyes:  Negative for pain, itching, visual disturbance and dryness.  Respiratory:  Positive for cough. Negative for hemoptysis, shortness of breath and difficulty breathing.   Cardiovascular:  Negative for chest pain, palpitations, hypertension and swelling in legs/feet.  Gastrointestinal:  Negative for blood in stool, constipation and diarrhea.  Endocrine: Negative for increased urination.  Genitourinary:  Negative for difficulty urinating and painful urination.  Musculoskeletal:  Positive for joint pain, joint pain, joint swelling and morning stiffness. Negative for myalgias, muscle weakness, muscle tenderness and myalgias.  Skin:  Negative for color change, pallor, rash, hair loss, nodules/bumps, redness, skin tightness, ulcers and sensitivity to sunlight.  Allergic/Immunologic: Negative for susceptible to infections.  Neurological:  Negative for dizziness, numbness, headaches, memory loss and weakness.  Hematological:  Negative for bruising/bleeding tendency and swollen glands.  Psychiatric/Behavioral:  Negative for depressed mood, confusion and sleep disturbance. The patient is not nervous/anxious.    PMFS History:  Patient Active Problem List   Diagnosis Date Noted   Pleural effusion on left 04/22/2021   Preventative health care 04/01/2021   Unilateral primary osteoarthritis, left knee 12/17/2018   Status post total left knee  replacement 12/17/2018   Primary osteoarthritis of left knee 11/23/2018   Carpal tunnel syndrome, left upper limb 11/23/2018   BMI 40.0-44.9, adult ([place]) 07/12/2018   Primary osteoarthritis of both knees 12/24/2016   DDD (degenerative disc disease), thoracic 11/17/2016   DDD (degenerative disc disease),  lumbar 11/17/2016   Vitamin D deficiency 11/17/2016   History of rosacea/ History of acne 11/17/2016   High risk medications (not anticoagulants) long-term use 06/25/2016   History of degenerative disc disease 06/25/2016   Severe obesity (BMI >= 40) ([place]) 06/13/2014   Left-sided thoracic back pain 07/19/2013   PALPITATIONS 10/17/2009   UTI 05/24/2009   Rheumatoid arthritis ([place]) 05/24/2009   Hair loss 02/22/2009   VAGINAL BLEEDING 10/05/2008   Carrier of group B Streptococcus 07/28/2007   GERD 12/28/2006   CARDIAC MURMUR, HX OF 12/28/2006   HIATAL HERNIA, HX OF 12/28/2006    Past Medical History:  Diagnosis Date   Allergy    Arthritis    Carpal tunnel syndrome 10/2018   DDD (degenerative disc disease), lumbar    DDD (degenerative disc disease), thoracic    Family history of adverse reaction to anesthesia    Mothers sister never woke up from surgery   GERD (gastroesophageal reflux disease)    Grade I diastolic dysfunction 0000000   Heart murmur    Heart palpitations    History of   History of hiatal hernia    OA (osteoarthritis)    Obese    Osteoporosis    Rheumatoid arthritis ([place])    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    Vitamin D deficiency     Family History  Problem Relation Age of Onset   Uterine cancer Mother    Diabetes Father    Kidney cancer Father    Colon cancer Sister 90   Rectal cancer Sister    Colon cancer Paternal Grandfather    Colon polyps Brother    Cancer Sister        breast    Healthy Daughter    Esophageal cancer Neg Hx    Stomach cancer Neg Hx    Past Surgical History:  Procedure Laterality Date   COLONOSCOPY  2014   DB- normal    KNEE ARTHROPLASTY     THORACENTESIS  04/2021   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   Procedure: LEFT TOTAL KNEE ARTHROPLASTY;  Surgeon: Mcarthur Rossetti, MD;  Location: [facility];  Service: Orthopedics;  Laterality: Left;   UPPER GASTROINTESTINAL ENDOSCOPY     Social History   Social History Narrative    Exercise--- sometimes walks 1x a week   Immunization History  Administered Date(s) Administered   Influenza Whole 02/08/2009   Influenza,inj,Quad PF,6+ Mos 03/09/2013, 06/13/2014, 06/16/2016, 02/14/2019, 03/30/2020, 04/01/2021   PFIZER(Purple Top)SARS-COV-2 Vaccination 08/26/2019, 09/27/2019, 05/30/2020   PNEUMOCOCCAL CONJUGATE-20 04/01/2021   Td 09/29/2001   Tdap 06/13/2014     Objective: Vital Signs: BP 120/82 (BP Location: Left Arm, Patient Position: Sitting, Cuff Size: Large)    Pulse 87    Ht 5\' 2"  (1.575 m)    Wt 239 lb (108.4 kg)    LMP 08/18/2018    BMI 43.71 kg/m    Physical Exam Vitals and nursing note reviewed.  Constitutional:      Appearance: She is well-developed.  HENT:     Head: Normocephalic and atraumatic.  Eyes:     Conjunctiva/sclera: Conjunctivae normal.  Cardiovascular:     Rate and Rhythm: Normal rate and regular rhythm.     Heart sounds: Normal  heart sounds.  Pulmonary:     Effort: Pulmonary effort is normal.     Comments: Decreased breath sounds left lower lobe at lung base Abdominal:     General: Bowel sounds are normal.     Palpations: Abdomen is soft.  Musculoskeletal:     Cervical back: Normal range of motion.  Skin:    General: Skin is warm and dry.     Capillary Refill: Capillary refill takes less than 2 seconds.  Neurological:     Mental Status: She is alert and oriented to person, place, and time.  Psychiatric:        Behavior: Behavior normal.     Musculoskeletal Exam: C-spine has good range of motion with no discomfort.  Thoracic kyphosis noted.  Shoulder joints have good range of motion with discomfort in the left shoulder.  Elbow joints have good range of motion with no tenderness or inflammation.  Limited range of motion of both wrist joints especially the right wrist with extension.  Synovial thickening of both wrists in all MCP joints.  Tenderness over the right second and third PIP joints and left first and second MCP joints.  PIP  and DIP thickening consistent with osteoarthritis of both hands.  Hip joints have good range of motion with no groin pain.  Left knee replacement has good range of motion with warmth but no effusion.  Right knee joint has good range of motion with no warmth or effusion.  Ankle joints have good range of motion with no tenderness or synovitis.  CDAI Exam: CDAI Score: 0.8  Patient Global: 4 mm; Provider Global: 4 mm Swollen: 0 ; Tender: 0  Joint Exam 06/17/2021   No joint exam has been documented for this visit   There is currently no information documented on the homunculus. Go to the Rheumatology activity and complete the homunculus joint exam.  Investigation: No additional findings.  Imaging: No results found.  Recent Labs: Lab Results  Component Value Date   WBC 7.4 04/01/2021   HGB 12.7 04/01/2021   PLT 281.0 04/01/2021   NA 139 04/01/2021   K 4.4 04/01/2021   CL 103 04/01/2021   CO2 29 04/01/2021   GLUCOSE 81 04/01/2021   BUN 17 04/01/2021   CREATININE 0.54 04/01/2021   BILITOT 0.4 04/01/2021   ALKPHOS 76 04/01/2021   AST 22 04/01/2021   ALT 22 04/01/2021   PROT 7.1 04/01/2021   ALBUMIN 3.9 04/01/2021   CALCIUM 9.1 04/01/2021   GFRAA 114 10/12/2020   QFTBGOLDPLUS NEGATIVE 10/12/2020    Speciality Comments: Humira- quit working Simponi- inadequate response Orencia-since 07/2019 Reclast start 07/18/20  Procedures:  No procedures performed Allergies: Penicillins   Assessment / Plan:     Visit Diagnoses: Rheumatoid arthritis involving multiple sites with positive rheumatoid factor (HCC) - Positive rheumatoid factor, positive anti-CCP, erosive disease: Patient has longstanding history of severe erosive rheumatoid arthritis.  X-rays of both hands and feet were updated on 11/14/2020 which revealed radiographic progression since 2019.  Previous therapies include Humira, Simponi, Orencia, and Plaquenil which she discontinued due to inadequate response.  She is currently  prescribed Rinvoq 15 mg 1 tablet by mouth daily, methotrexate 25 mg subcutaneous injections once weekly, and folic acid 2 mg daily.  According to the patient her rheumatoid arthritis has been best controlled on methotrexate and Rinvoq as combination. At her last office visit on 04/16/2021 she presented with a daily productive cough which started in August 2022.  Of note she initiated running  both in July 2022.  At her last office visit she was advised to hold methotrexate and rinvoq.  A chest x-ray was ordered on 04/16/2021 which revealed a small left pleural effusion.  She was referred to [facility] urgently, and she was evaluated by Dr. Melvyn Novas on 04/22/21.  She underwent a thoracentesis on 04/25/2021 which was felt to be "classic for RA related effusion."  Her cough initially improved after the thoracentesis and while off of therapy. She was off of methotrexate from 04/11/2021 to 05/02/2021.  Rinvoq was held from 04/17/2021 to 04/09/2021.  While off of therapy she had one of the most severe rheumatoid arthritis flare that she has had in years.  During that time she was taking Aleve as needed for symptomatic relief.  She was unable to perform most of her ADLs and was experiencing severe nocturnal pain and inflammation involving multiple joints.  Since resuming therapy her joint pain and inflammation has improved significantly.  Unfortunately since resuming methotrexate and Rinvoq the daily productive cough has returned.  She is not experiencing any fevers, chills, or body aches at this time.  She has no pleuritic chest pain, palpitations, orthopnea, or shortness of breath.  On examination today she had decreased breath sounds in the left lower lung bases.  A repeat chest x-ray was ordered today.  An urgent referral to [facility] pulmonology will also be placed for second opinion. Discussed the patients case with Dr. Estanislado Pandy today in detail.  Her recommend is to try to better control her RA.   The patient will start  on prednisone 20 mg tapering by 5 mg every week.  She will also restart on Plaquenil 200 mg twice daily Monday through Friday as triple therapy.  She previously tolerated Plaquenil without any side effects. We are apprehensive to make any drastic medication changes at this time if the pleural effusion is due to uncontrolled rheumatoid arthritis.  It is still unclear if her current symptoms are a medication side effect or due to underlying rheumatoid arthritis.  Reviewed the black box warning associated with taking a Jak inhibitor including increased risk for major cardiovascular events.  Discussed that she should monitor symptoms closely and notify us of any changes.   She will follow-up in the office in 6 weeks or sooner if needed or if pulm is able to see her sooner.   High risk medications (not anticoagulants) long-term use - Rinvoq 15 mg 1 tablet by mouth daily (started in July 2022), methotrexate 25 mg sq injections once weekly, and folic acid 2 mg po daily.  She will be adding on plaquenil as triple therapy.  She previously tolerated plaquenil without any side effects.  She will take plaquenil 200 mg 1 tablet by mouth twice daily Monday through Friday.  She is aware that she will require yearly eye examinations (OCT).   A prednisone taper starting at 20 mg tapering by 5 mg every week as sent to the pharmacy. She was advised to take prednisone in the morning with food and to avoid NSAID use while taking prednisone.   CBC and CMP will be updated today prior to starting plaquenil.  TB gold negative on 10/12/20.  Future order for TB gold will be placed today. - Plan: CBC with Differential/Platelet, COMPLETE METABOLIC PANEL WITH GFR Previous therapy includes: Plaquenil, Humira, Simponi, Orencia--discontinued due to inadequate response.  No recent infections.  Discussed the importance of holding Rinvoq and methotrexate if she develops signs or symptoms of an infection and to  resume once the infection has  completely cleared. Discussed the above box warning associated with taking a Jak inhibitor including increased risk for major cardiovascular events and malignancy.  Patient was counseled on the purpose, proper use, and adverse effects of hydroxychloroquine including nausea/diarrhea, skin rash, headaches, and sun sensitivity.  Advised patient to wear sunscreen once starting hydroxychloroquine to reduce risk of rash associated with sun sensitivity.  Discussed importance of annual eye exams while on hydroxychloroquine to monitor to ocular toxicity and discussed importance of frequent laboratory monitoring.  Provided patient with eye exam form for baseline ophthalmologic exam.  Reviewed risk for QTC prolongation when used in combination with other QTc prolonging agents (including but not limited to antiarrhythmics, macrolide antibiotics, flouroquinolones, tricyclic antidepressants, citalopram, specific antipsychotics, ondansetron, migraine triptans, and methadone). Provided patient with educational materials on hydroxychloroquine and answered all questions.  Patient consented to hydroxychloroquine. Will upload consent in the media tab.    Dose will be Plaquenil 200 mg twice daily Monday through Friday.  Prescription pending lab results.  Pleural effusion, left: She presented with a daily productive cough at her last office visit on 04/16/21, which inititially started in August 2022.  Of note, she initiated Rinvoq in July 2022.  She was for a chest x-ray on 04/16/21: findings: small left effusion and bibasilar atelectasis.   An urgent referral to [facility] was placed at that time and she was evaluated by Dr. Melvyn Novas on 04/22/2021.  She underwent a thoracentesis on 04/25/2021 which revealed findings" classic for a rheumatoid arthritis related effusion."  More aggressive management of her rheumatoid arthritis was recommended by Dr. Melvyn Novas at that time.   She has resumed both Rinvoq and methotrexate.  The cough  returned in early January 2022. The cough has been productive with yellow discharge. She is not experiencing any fever, chills, pleuritic chest pain, orthopnea, or palpitations. Decreased breath sounds noted in the left lower lung bases noted on exam today.  Discussed the patients case in detail with Dr. Estanislado Pandy today.  Different treatment options were discussed today.  She does not want to discontinue methotrexate and Rinvoq since they have controlled her rheumatoid arthritis better than of any medication she has tried in the past.  Discussed adding on Plaquenil as triple therapy.  She previously tolerated Plaquenil without any side effects.  I also sent in a prednisone taper starting at 20 mg tapering by 5 mg every week.  An updated chest x-ray will be obtained today.  An urgent referral to [facility] pulmonology will also be placed today for further evaluation.    Primary osteoarthritis of right knee:  She had a flare in the right knee several weeks ago.  Her discomfort has improved.  She has good ROM of the right knee joint on examination today.  No warmth or effusion noted.    S/P total knee arthroplasty, left: Doing well.  She has good ROM of the left knee replacement with no discomfort.  Warmth but no effusion noted.   DDD (degenerative disc disease), thoracic: Thoracic kyphosis noted.   DDD (degenerative disc disease), lumbar: She is not experiencing any increased lower back pain at this time. No symptoms of radiculopathy at this time.   Age-related osteoporosis without current pathological fracture - DEXA on 02/02/20: Left femur neck BMD 0.437 with T-score -3.7. She received first dose of Reclast IV on July 18, 2020. Due for next Reclast infusion in March 2023.  She continues to take vitamin D 2000 units daily and a calcium  supplement daily.  No recent falls or fractures.   History of vitamin D deficiency: She is taking vitamin D 2000 units daily.   Other medical conditions are listed as follows:    History of rosacea/ History of acne  History of gastroesophageal reflux (GERD)  History of hiatal hernia  Orders: Orders Placed This Encounter  Procedures   DG Chest 2 View   CBC with Differential/Platelet   COMPLETE METABOLIC PANEL WITH GFR   QuantiFERON-TB Gold Plus   Meds ordered this encounter  Medications   predniSONE (DELTASONE) 5 MG tablet    Sig: Take 4 tablets by mouth daily x1wk, 3 tablets daily x1wk, 2 tablets daily x1 wk, 1 tablet daily x1wk.    Dispense:  70 tablet    Refill:  0     Follow-Up Instructions: Return in about 6 weeks (around 07/29/2021) for Rheumatoid arthritis, Osteoarthritis.   Ofilia Neas, PA-C  Note - This record has been created using Dragon software.  Chart creation errors have been sought, but may not always  have been located. Such creation errors do not reflect on  the standard of medical care.

## 2021-06-17 NOTE — Progress Notes (Signed)
Pharmacy Note  Subjective: Patient presents today to [facility] for follow up office visit.   Patient seen by the pharmacist for counseling on hydroxychloroquine for rheumatoid arthritis.  She currently takes methotrexate 25 mg SQ weekly (with folic acid 2 mg daily) and Rinvoq 15 mg daily.  Objective: CMP     Component Value Date/Time   NA 139 04/01/2021 0900   K 4.4 04/01/2021 0900   CL 103 04/01/2021 0900   CO2 29 04/01/2021 0900   GLUCOSE 81 04/01/2021 0900   BUN 17 04/01/2021 0900   CREATININE 0.54 04/01/2021 0900   CREATININE 0.51 02/21/2021 0828   CALCIUM 9.1 04/01/2021 0900   PROT 7.1 04/01/2021 0900   ALBUMIN 3.9 04/01/2021 0900   AST 22 04/01/2021 0900   ALT 22 04/01/2021 0900   ALKPHOS 76 04/01/2021 0900   BILITOT 0.4 04/01/2021 0900   GFRNONAA 98 10/12/2020 1421   GFRAA 114 10/12/2020 1421    CBC    Component Value Date/Time   WBC 7.4 04/01/2021 0900   RBC 4.10 04/01/2021 0900   HGB 12.7 04/01/2021 0900   HCT 38.0 04/01/2021 0900   PLT 281.0 04/01/2021 0900   MCV 92.7 04/01/2021 0900   MCH 29.9 02/21/2021 0828   MCHC 33.3 04/01/2021 0900   RDW 16.8 (H) 04/01/2021 0900   LYMPHSABS 2.3 04/01/2021 0900   MONOABS 0.5 04/01/2021 0900   EOSABS 0.1 04/01/2021 0900   BASOSABS 0.0 04/01/2021 0900    Assessment/Plan: Patient was counseled on the purpose, proper use, and adverse effects of hydroxychloroquine including nausea/diarrhea, skin rash, headaches, and sun sensitivity.  Advised patient to wear sunscreen once starting hydroxychloroquine to reduce risk of rash associated with sun sensitivity.  Discussed importance of annual eye exams while on hydroxychloroquine to monitor to ocular toxicity and discussed importance of frequent laboratory monitoring.  Provided patient with eye exam form for baseline ophthalmologic exam.  Reviewed risk for QTC prolongation when used in combination with other QTc prolonging agents (including but not limited to  antiarrhythmics, macrolide antibiotics, flouroquinolones, tricyclic antidepressants, citalopram, specific antipsychotics, ondansetron, migraine triptans, and methadone). Provided patient with educational materials on hydroxychloroquine and answered all questions.  Patient consented to hydroxychloroquine. Will upload consent in the media tab.    She will plan to call her ophthalmologist to schedule appt.  Dose will be Plaquenil 200 mg twice daily Monday through Friday.  Prescription pending lab results.  Chesley Mires, PharmD, MPH, BCPS Clinical Pharmacist (Rheumatology and Pulmonology)

## 2021-06-18 LAB — CBC WITH DIFFERENTIAL/PLATELET
Absolute Monocytes: 420 cells/uL (ref 200–950)
Basophils Absolute: 30 cells/uL (ref 0–200)
Basophils Relative: 0.4 %
Eosinophils Absolute: 113 cells/uL (ref 15–500)
Eosinophils Relative: 1.5 %
HCT: 40.3 % (ref 35.0–45.0)
Hemoglobin: 13.1 g/dL (ref 11.7–15.5)
Lymphs Abs: 2498 cells/uL (ref 850–3900)
MCH: 29.6 pg (ref 27.0–33.0)
MCHC: 32.5 g/dL (ref 32.0–36.0)
MCV: 91.2 fL (ref 80.0–100.0)
MPV: 10 fL (ref 7.5–12.5)
Monocytes Relative: 5.6 %
Neutro Abs: 4440 cells/uL (ref 1500–7800)
Neutrophils Relative %: 59.2 %
Platelets: 306 10*3/uL (ref 140–400)
RBC: 4.42 10*6/uL (ref 3.80–5.10)
RDW: 14.7 % (ref 11.0–15.0)
Total Lymphocyte: 33.3 %
WBC: 7.5 10*3/uL (ref 3.8–10.8)

## 2021-06-18 LAB — COMPLETE METABOLIC PANEL WITH GFR
AG Ratio: 1.1 (calc) (ref 1.0–2.5)
ALT: 20 U/L (ref 6–29)
AST: 23 U/L (ref 10–35)
Albumin: 4 g/dL (ref 3.6–5.1)
Alkaline phosphatase (APISO): 76 U/L (ref 37–153)
BUN: 15 mg/dL (ref 7–25)
CO2: 33 mmol/L — ABNORMAL HIGH (ref 20–32)
Calcium: 9.2 mg/dL (ref 8.6–10.4)
Chloride: 103 mmol/L (ref 98–110)
Creat: 0.55 mg/dL (ref 0.50–1.03)
Globulin: 3.5 g/dL (calc) (ref 1.9–3.7)
Glucose, Bld: 98 mg/dL (ref 65–99)
Potassium: 4.4 mmol/L (ref 3.5–5.3)
Sodium: 140 mmol/L (ref 135–146)
Total Bilirubin: 0.4 mg/dL (ref 0.2–1.2)
Total Protein: 7.5 g/dL (ref 6.1–8.1)
eGFR: 108 mL/min/{1.73_m2} (ref >=60)

## 2021-06-18 MED ORDER — HYDROXYCHLOROQUINE SULFATE 200 MG PO TABS: ORAL_TABLET | ORAL | 0 refills | Status: DC

## 2021-06-18 NOTE — Progress Notes (Signed)
CBC and CMP WNL

## 2021-06-18 NOTE — Telephone Encounter (Signed)
CBC and CMP WNL

## 2021-06-27 DIAGNOSIS — F4323 Adjustment disorder with mixed anxiety and depressed mood: Secondary | ICD-10-CM | POA: Diagnosis not present

## 2021-07-03 ENCOUNTER — Other Ambulatory Visit (HOSPITAL_COMMUNITY): Payer: Self-pay

## 2021-07-04 ENCOUNTER — Other Ambulatory Visit: Payer: Self-pay | Admitting: Physician Assistant

## 2021-07-05 ENCOUNTER — Other Ambulatory Visit (HOSPITAL_COMMUNITY): Payer: Self-pay

## 2021-07-05 NOTE — Telephone Encounter (Signed)
Next Visit: 07/29/2021  Last Visit: 06/17/2021  Last Fill: 06/15/2020  Dx:  Rheumatoid arthritis involving multiple sites with positive rheumatoid factor   Current Dose per office note on 06/17/2021: folic acid 2 mg po daily  Okay to refill Folic Acid?

## 2021-07-10 ENCOUNTER — Other Ambulatory Visit (HOSPITAL_COMMUNITY): Payer: Self-pay

## 2021-07-15 NOTE — Progress Notes (Deleted)
? ?Office Visit Note ? ?Patient: Donna Lopez             ?Date of Birth: Jan 26, 1966           ?MRN: [id number]             ?PCP: [name], DO ?Referring: [name], * ?Visit Date: 07/29/2021 ?Occupation: @GUAROCC @ ? ?Subjective:  ?No chief complaint on file. ? ? ?History of Present Illness: Donna Lopez is a 56 y.o. female ***  ? ?Activities of Daily Living:  ?Patient reports morning stiffness for *** {minute/hour:19697}.   ?Patient {ACTIONS;DENIES/REPORTS:21021675::"Denies"} nocturnal pain.  ?Difficulty dressing/grooming: {ACTIONS;DENIES/REPORTS:21021675::"Denies"} ?Difficulty climbing stairs: {ACTIONS;DENIES/REPORTS:21021675::"Denies"} ?Difficulty getting out of chair: {ACTIONS;DENIES/REPORTS:21021675::"Denies"} ?Difficulty using hands for taps, buttons, cutlery, and/or writing: {ACTIONS;DENIES/REPORTS:21021675::"Denies"} ? ?No Rheumatology ROS completed.  ? ?PMFS History:  ?Patient Active Problem List  ? Diagnosis Date Noted  ? Pleural effusion on left 04/22/2021  ? Preventative health care 04/01/2021  ? Unilateral primary osteoarthritis, left knee 12/17/2018  ? Status post total left knee replacement 12/17/2018  ? Primary osteoarthritis of left knee 11/23/2018  ? Carpal tunnel syndrome, left upper limb 11/23/2018  ? BMI 40.0-44.9, adult ([place]) 07/12/2018  ? Primary osteoarthritis of both knees 12/24/2016  ? DDD (degenerative disc disease), thoracic 11/17/2016  ? DDD (degenerative disc disease), lumbar 11/17/2016  ? Vitamin D deficiency 11/17/2016  ? History of rosacea/ History of acne 11/17/2016  ? High risk medications (not anticoagulants) long-term use 06/25/2016  ? History of degenerative disc disease 06/25/2016  ? Severe obesity (BMI >= 40) ([place]) 06/13/2014  ? Left-sided thoracic back pain 07/19/2013  ? PALPITATIONS 10/17/2009  ? UTI 05/24/2009  ? Rheumatoid arthritis ([place]) 05/24/2009  ? Hair loss 02/22/2009  ? VAGINAL BLEEDING 10/05/2008  ? Carrier of group B Streptococcus  07/28/2007  ? GERD 12/28/2006  ? CARDIAC MURMUR, HX OF 12/28/2006  ? HIATAL HERNIA, HX OF 12/28/2006  ?  ?Past Medical History:  ?Diagnosis Date  ? Allergy   ? Arthritis   ? Carpal tunnel syndrome 10/2018  ? DDD (degenerative disc disease), lumbar   ? DDD (degenerative disc disease), thoracic   ? Family history of adverse reaction to anesthesia   ? Mothers sister never woke up from surgery  ? GERD (gastroesophageal reflux disease)   ? Grade I diastolic dysfunction 0000000  ? Heart murmur   ? Heart palpitations   ? History of  ? History of hiatal hernia   ? OA (osteoarthritis)   ? Obese   ? Osteoporosis   ? Rheumatoid arthritis ([place])   ? 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   ? Vitamin D deficiency   ?  ?Family History  ?Problem Relation Age of Onset  ? Uterine cancer Mother   ? Diabetes Father   ? Kidney cancer Father   ? Colon cancer Sister 72  ? Rectal cancer Sister   ? Colon cancer Paternal Grandfather   ? Colon polyps Brother   ? Cancer Sister   ?     breast   ? Healthy Daughter   ? Esophageal cancer Neg Hx   ? Stomach cancer Neg Hx   ? ?Past Surgical History:  ?Procedure Laterality Date  ? COLONOSCOPY  2014  ? DB- normal   ? KNEE ARTHROPLASTY    ? THORACENTESIS  04/2021  ? 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  ? Procedure: LEFT TOTAL KNEE ARTHROPLASTY;  Surgeon: Mcarthur Rossetti, MD;  Location: [facility];  Service: Orthopedics;  Laterality: Left;  ?  UPPER GASTROINTESTINAL ENDOSCOPY    ? ?Social History  ? ?Social History Narrative  ? Exercise--- sometimes walks 1x a week  ? ?Immunization History  ?Administered Date(s) Administered  ? Influenza Whole 02/08/2009  ? Influenza,inj,Quad PF,6+ Mos 03/09/2013, 06/13/2014, 06/16/2016, 02/14/2019, 03/30/2020, 04/01/2021  ? PFIZER(Purple Top)SARS-COV-2 Vaccination 08/26/2019, 09/27/2019, 05/30/2020  ? PNEUMOCOCCAL CONJUGATE-20 04/01/2021  ? Td 09/29/2001  ? Tdap 06/13/2014  ?  ? ?Objective: ?Vital Signs: LMP 08/18/2018   ? ?Physical Exam  ? ?Musculoskeletal Exam: *** ? ?CDAI  Exam: ?CDAI Score: -- ?Patient Global: --; Provider Global: -- ?Swollen: --; Tender: -- ?Joint Exam 07/29/2021  ? ?No joint exam has been documented for this visit  ? ?There is currently no information documented on the homunculus. Go to the Rheumatology activity and complete the homunculus joint exam. ? ?Investigation: ?No additional findings. ? ?Imaging: ?No results found. ? ?Recent Labs: ?Lab Results  ?Component Value Date  ? WBC 7.5 06/17/2021  ? HGB 13.1 06/17/2021  ? PLT 306 06/17/2021  ? NA 140 06/17/2021  ? K 4.4 06/17/2021  ? CL 103 06/17/2021  ? CO2 33 (H) 06/17/2021  ? GLUCOSE 98 06/17/2021  ? BUN 15 06/17/2021  ? CREATININE 0.55 06/17/2021  ? BILITOT 0.4 06/17/2021  ? ALKPHOS 76 04/01/2021  ? AST 23 06/17/2021  ? ALT 20 06/17/2021  ? PROT 7.5 06/17/2021  ? ALBUMIN 3.9 04/01/2021  ? CALCIUM 9.2 06/17/2021  ? GFRAA 114 10/12/2020  ? QFTBGOLDPLUS NEGATIVE 10/12/2020  ? ? ?Speciality Comments: Humira- quit working ?Simponi- inadequate response ?Orencia-since 07/2019 ?Reclast start 07/18/20 ? ?Procedures:  ?No procedures performed ?Allergies: Penicillins  ? ?Assessment / Plan:     ?Visit Diagnoses: No diagnosis found. ? ?Orders: ?No orders of the defined types were placed in this encounter. ? ?No orders of the defined types were placed in this encounter. ? ? ?Face-to-face time spent with patient was *** minutes. Greater than 50% of time was spent in counseling and coordination of care. ? ?Follow-Up Instructions: No follow-ups on file. ? ? ?Earnestine Mealing, CMA ? ?Note - This record has been created using [employer].  ?Chart creation errors have been sought, but may not always  ?have been located. Such creation errors do not reflect on  ?the standard of medical care.  ?

## 2021-07-19 NOTE — Telephone Encounter (Signed)
Spoke with patient and advised patient that imaging reports can be viewed in [place]. She verbalized understanding.  ?

## 2021-07-23 DIAGNOSIS — F4323 Adjustment disorder with mixed anxiety and depressed mood: Secondary | ICD-10-CM | POA: Diagnosis not present

## 2021-07-29 ENCOUNTER — Ambulatory Visit: Payer: BC Managed Care – PPO | Admitting: Physician Assistant

## 2021-07-29 DIAGNOSIS — M1711 Unilateral primary osteoarthritis, right knee: Secondary | ICD-10-CM

## 2021-07-29 DIAGNOSIS — Z96652 Presence of left artificial knee joint: Secondary | ICD-10-CM

## 2021-07-29 DIAGNOSIS — Z79899 Other long term (current) drug therapy: Secondary | ICD-10-CM

## 2021-07-29 DIAGNOSIS — M5134 Other intervertebral disc degeneration, thoracic region: Secondary | ICD-10-CM

## 2021-07-29 DIAGNOSIS — M810 Age-related osteoporosis without current pathological fracture: Secondary | ICD-10-CM

## 2021-07-29 DIAGNOSIS — Z872 Personal history of diseases of the skin and subcutaneous tissue: Secondary | ICD-10-CM

## 2021-07-29 DIAGNOSIS — M0579 Rheumatoid arthritis with rheumatoid factor of multiple sites without organ or systems involvement: Secondary | ICD-10-CM

## 2021-07-29 DIAGNOSIS — Z8719 Personal history of other diseases of the digestive system: Secondary | ICD-10-CM

## 2021-07-29 DIAGNOSIS — M5136 Other intervertebral disc degeneration, lumbar region: Secondary | ICD-10-CM

## 2021-07-29 DIAGNOSIS — Z8639 Personal history of other endocrine, nutritional and metabolic disease: Secondary | ICD-10-CM

## 2021-07-29 DIAGNOSIS — J90 Pleural effusion, not elsewhere classified: Secondary | ICD-10-CM

## 2021-07-30 ENCOUNTER — Other Ambulatory Visit (HOSPITAL_COMMUNITY): Payer: Self-pay

## 2021-08-01 NOTE — Progress Notes (Signed)
? ?Office Visit Note ? ?Patient: Donna Lopez             ?Date of Birth: January 29, 1966           ?MRN: [id number]             ?PCP: [name], DO ?Referring: [name], * ?Visit Date: 08/14/2021 ?Occupation: @GUAROCC @ ? ?Subjective:  ?Medication monitoring  ? ?History of Present Illness: Donna Lopez is a 56 y.o. female with history of seropositive rheumatoid arthritis and osteoarthritis.  She is taking Rinvoq 15 mg 1 tablet by mouth daily (started in July 2022), methotrexate 25 mg sq injections once weekly, Plaquenil 200 mg twice daily Monday through Friday.  She is tolerating these medications without any side effects and has not missed any doses recently.  She has noticed improvement in her joint pain, stiffness, and swelling on triple therapy.  She states that her joint pain has been manageable.  Her swelling in her hands and wrist has improved. ?She denies any recent infections.  She states that in the morning she still has a mild productive cough which resolves as the day goes on.  She has an upcoming appointment at [facility] pulmonary scheduled.  She denies any fevers or any other new or worsening pulmonary symptoms.  She is due for her next Reclast IV infusion.  She has not had any recent falls or fractures. ? ? ? ? ?Activities of Daily Living:  ?Patient reports morning stiffness for 0 minutes.   ?Patient Denies nocturnal pain.  ?Difficulty dressing/grooming: Denies ?Difficulty climbing stairs: Denies ?Difficulty getting out of chair: Denies ?Difficulty using hands for taps, buttons, cutlery, and/or writing: Reports ? ?Review of Systems  ?Constitutional:  Negative for fatigue.  ?HENT:  Negative for mouth sores, mouth dryness and nose dryness.   ?Eyes:  Negative for pain, itching and dryness.  ?Respiratory:  Negative for shortness of breath and difficulty breathing.   ?Cardiovascular:  Negative for chest pain and palpitations.  ?Gastrointestinal:  Negative for blood in stool,  constipation and diarrhea.  ?Endocrine: Negative for increased urination.  ?Genitourinary:  Negative for difficulty urinating.  ?Musculoskeletal:  Positive for joint pain, joint pain and joint swelling. Negative for myalgias, morning stiffness, muscle tenderness and myalgias.  ?Skin:  Negative for color change, rash and redness.  ?Allergic/Immunologic: Negative for susceptible to infections.  ?Neurological:  Negative for dizziness, numbness, headaches, memory loss and weakness.  ?Hematological:  Positive for bruising/bleeding tendency.  ?Psychiatric/Behavioral:  Negative for confusion.   ? ?PMFS History:  ?Patient Active Problem List  ? Diagnosis Date Noted  ? Pleural effusion on left 04/22/2021  ? Preventative health care 04/01/2021  ? Unilateral primary osteoarthritis, left knee 12/17/2018  ? Status post total left knee replacement 12/17/2018  ? Primary osteoarthritis of left knee 11/23/2018  ? Carpal tunnel syndrome, left upper limb 11/23/2018  ? BMI 40.0-44.9, adult ([place]) 07/12/2018  ? Primary osteoarthritis of both knees 12/24/2016  ? DDD (degenerative disc disease), thoracic 11/17/2016  ? DDD (degenerative disc disease), lumbar 11/17/2016  ? Vitamin D deficiency 11/17/2016  ? History of rosacea/ History of acne 11/17/2016  ? High risk medications (not anticoagulants) long-term use 06/25/2016  ? History of degenerative disc disease 06/25/2016  ? Severe obesity (BMI >= 40) ([place]) 06/13/2014  ? Left-sided thoracic back pain 07/19/2013  ? PALPITATIONS 10/17/2009  ? UTI 05/24/2009  ? Rheumatoid arthritis ([place]) 05/24/2009  ? Hair loss 02/22/2009  ? VAGINAL BLEEDING 10/05/2008  ?  Carrier of group B Streptococcus 07/28/2007  ? GERD 12/28/2006  ? CARDIAC MURMUR, HX OF 12/28/2006  ? HIATAL HERNIA, HX OF 12/28/2006  ?  ?Past Medical History:  ?Diagnosis Date  ? Allergy   ? Arthritis   ? Carpal tunnel syndrome 10/2018  ? DDD (degenerative disc disease), lumbar   ? DDD (degenerative disc disease), thoracic   ? Family  history of adverse reaction to anesthesia   ? Mothers sister never woke up from surgery  ? GERD (gastroesophageal reflux disease)   ? Grade I diastolic dysfunction 0000000  ? Heart murmur   ? Heart palpitations   ? History of  ? History of hiatal hernia   ? OA (osteoarthritis)   ? Obese   ? Osteoporosis   ? Rheumatoid arthritis ([place])   ? 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   ? Vitamin D deficiency   ?  ?Family History  ?Problem Relation Age of Onset  ? Uterine cancer Mother   ? Diabetes Father   ? Kidney cancer Father   ? Colon cancer Sister 67  ? Rectal cancer Sister   ? Colon cancer Paternal Grandfather   ? Colon polyps Brother   ? Cancer Sister   ?     breast   ? Healthy Daughter   ? Esophageal cancer Neg Hx   ? Stomach cancer Neg Hx   ? ?Past Surgical History:  ?Procedure Laterality Date  ? COLONOSCOPY  2014  ? DB- normal   ? KNEE ARTHROPLASTY    ? THORACENTESIS  04/2021  ? 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  ? Procedure: LEFT TOTAL KNEE ARTHROPLASTY;  Surgeon: Mcarthur Rossetti, MD;  Location: [facility];  Service: Orthopedics;  Laterality: Left;  ? UPPER GASTROINTESTINAL ENDOSCOPY    ? ?Social History  ? ?Social History Narrative  ? Exercise--- sometimes walks 1x a week  ? ?Immunization History  ?Administered Date(s) Administered  ? Influenza Whole 02/08/2009  ? Influenza,inj,Quad PF,6+ Mos 03/09/2013, 06/13/2014, 06/16/2016, 02/14/2019, 03/30/2020, 04/01/2021  ? PFIZER(Purple Top)SARS-COV-2 Vaccination 08/26/2019, 09/27/2019, 05/30/2020  ? PNEUMOCOCCAL CONJUGATE-20 04/01/2021  ? Td 09/29/2001  ? Tdap 06/13/2014  ?  ? ?Objective: ?Vital Signs: BP 122/84 (BP Location: Left Arm, Patient Position: Sitting, Cuff Size: Large)   Pulse 83   Ht 5\' 2"  (1.575 m)   Wt 246 lb 3.2 oz (111.7 kg)   LMP 08/18/2018   BMI 45.03 kg/m?   ? ?Physical Exam ?Vitals and nursing note reviewed.  ?Constitutional:   ?   Appearance: She is well-developed.  ?HENT:  ?   Head: Normocephalic and atraumatic.  ?Eyes:  ?   Conjunctiva/sclera:  Conjunctivae normal.  ?Cardiovascular:  ?   Rate and Rhythm: Normal rate and regular rhythm.  ?   Heart sounds: Normal heart sounds.  ?Pulmonary:  ?   Effort: Pulmonary effort is normal.  ?   Comments: Diminished breath sounds-left lower lung base ?Abdominal:  ?   General: Bowel sounds are normal.  ?   Palpations: Abdomen is soft.  ?Musculoskeletal:  ?   Cervical back: Normal range of motion.  ?Skin: ?   General: Skin is warm and dry.  ?   Capillary Refill: Capillary refill takes less than 2 seconds.  ?Neurological:  ?   Mental Status: She is alert and oriented to person, place, and time.  ?Psychiatric:     ?   Behavior: Behavior normal.  ?  ? ?Musculoskeletal Exam: C-spine has good ROM. Thoracic kyphosis.  Shoulder joints have good ROM.  Elbow  joints have good ROM with no tenderness or inflammation.  Limited ROM of both wrist joints, especially with flexion of the right wrist. Synovial thickening of both wrists and MCPs but no synovitis was noted.  PIP and DIP thickening consistent with osteoarthritis of both hands.  Hip joints have good ROM with no groin pain.  Left knee replacement hs good ROM with warmth but no effusion.  Right knee joint has good ROM with no warmth or effusion.  Pitting edema noted in both LE, left ankle > right.  ? ?CDAI Exam: ?CDAI Score: 0.6  ?Patient Global: 3 mm; Provider Global: 3 mm ?Swollen: 0 ; Tender: 0  ?Joint Exam 08/14/2021  ? ?No joint exam has been documented for this visit  ? ?There is currently no information documented on the homunculus. Go to the Rheumatology activity and complete the homunculus joint exam. ? ?Investigation: ?No additional findings. ? ?Imaging: ?No results found. ? ?Recent Labs: ?Lab Results  ?Component Value Date  ? WBC 7.5 06/17/2021  ? HGB 13.1 06/17/2021  ? PLT 306 06/17/2021  ? NA 140 06/17/2021  ? K 4.4 06/17/2021  ? CL 103 06/17/2021  ? CO2 33 (H) 06/17/2021  ? GLUCOSE 98 06/17/2021  ? BUN 15 06/17/2021  ? CREATININE 0.55 06/17/2021  ? BILITOT 0.4  06/17/2021  ? ALKPHOS 76 04/01/2021  ? AST 23 06/17/2021  ? ALT 20 06/17/2021  ? PROT 7.5 06/17/2021  ? ALBUMIN 3.9 04/01/2021  ? CALCIUM 9.2 06/17/2021  ? GFRAA 114 10/12/2020  ? QFTBGOLDPLUS NEGATIVE 10/12/2020  ? ? ?Speciality Com

## 2021-08-05 ENCOUNTER — Other Ambulatory Visit: Payer: Self-pay | Admitting: Rheumatology

## 2021-08-05 ENCOUNTER — Other Ambulatory Visit (HOSPITAL_COMMUNITY): Payer: Self-pay

## 2021-08-05 DIAGNOSIS — M0579 Rheumatoid arthritis with rheumatoid factor of multiple sites without organ or systems involvement: Secondary | ICD-10-CM

## 2021-08-05 MED ORDER — RINVOQ 15 MG PO TB24
15.0000 mg | ORAL_TABLET | Freq: Every day | ORAL | 2 refills | Status: DC
  Filled 2021-08-05: qty 30, 30d supply, fill #0
  Filled 2021-09-02: qty 30, 30d supply, fill #1
  Filled 2021-10-04: qty 30, 30d supply, fill #2

## 2021-08-05 MED ORDER — OTREXUP 25 MG/0.4ML ~~LOC~~ SOAJ
SUBCUTANEOUS | 0 refills | Status: DC
  Filled 2021-08-05: qty 1.6, 28d supply, fill #0
  Filled 2021-09-02: qty 1.6, 28d supply, fill #1
  Filled 2021-10-04: qty 1.6, 28d supply, fill #2

## 2021-08-05 NOTE — Telephone Encounter (Signed)
Next Visit: 07/29/2021 ?  ?Last Visit: 06/17/2021 ?  ?Last Fill: 05/09/2021 ?  ?Dx:  Rheumatoid arthritis involving multiple sites with positive rheumatoid factor  ?  ?Current Dose per office note on 06/17/2021:Rinvoq 15 mg 1 tablet by mouth daily methotrexate 25 mg sq injections once weekly ? ?Labs: 06/17/2021 CBC and CMP WNL ? ?TB Gold: 10/12/2021 Neg  ? ?Okay to refill Rinvoq and MTX?  ?

## 2021-08-08 ENCOUNTER — Other Ambulatory Visit (HOSPITAL_COMMUNITY): Payer: Self-pay

## 2021-08-14 ENCOUNTER — Encounter: Payer: Self-pay | Admitting: Physician Assistant

## 2021-08-14 ENCOUNTER — Telehealth: Payer: Self-pay | Admitting: Pharmacist

## 2021-08-14 ENCOUNTER — Ambulatory Visit: Payer: BC Managed Care – PPO | Admitting: Physician Assistant

## 2021-08-14 VITALS — BP 122/84 | HR 83 | Ht 62.0 in | Wt 246.2 lb

## 2021-08-14 DIAGNOSIS — Z79899 Other long term (current) drug therapy: Secondary | ICD-10-CM | POA: Diagnosis not present

## 2021-08-14 DIAGNOSIS — M1711 Unilateral primary osteoarthritis, right knee: Secondary | ICD-10-CM | POA: Diagnosis not present

## 2021-08-14 DIAGNOSIS — M0579 Rheumatoid arthritis with rheumatoid factor of multiple sites without organ or systems involvement: Secondary | ICD-10-CM | POA: Diagnosis not present

## 2021-08-14 DIAGNOSIS — M5136 Other intervertebral disc degeneration, lumbar region: Secondary | ICD-10-CM

## 2021-08-14 DIAGNOSIS — Z8719 Personal history of other diseases of the digestive system: Secondary | ICD-10-CM

## 2021-08-14 DIAGNOSIS — Z872 Personal history of diseases of the skin and subcutaneous tissue: Secondary | ICD-10-CM

## 2021-08-14 DIAGNOSIS — J90 Pleural effusion, not elsewhere classified: Secondary | ICD-10-CM | POA: Diagnosis not present

## 2021-08-14 DIAGNOSIS — E559 Vitamin D deficiency, unspecified: Secondary | ICD-10-CM | POA: Diagnosis not present

## 2021-08-14 DIAGNOSIS — M5134 Other intervertebral disc degeneration, thoracic region: Secondary | ICD-10-CM

## 2021-08-14 DIAGNOSIS — Z96652 Presence of left artificial knee joint: Secondary | ICD-10-CM

## 2021-08-14 DIAGNOSIS — M545 Low back pain, unspecified: Secondary | ICD-10-CM

## 2021-08-14 DIAGNOSIS — M810 Age-related osteoporosis without current pathological fracture: Secondary | ICD-10-CM

## 2021-08-14 MED ORDER — METHOCARBAMOL 500 MG PO TABS: 500.0000 mg | ORAL_TABLET | Freq: Two times a day (BID) | ORAL | 0 refills | Status: DC | PRN

## 2021-08-14 NOTE — Telephone Encounter (Signed)
Patient due for Reclast on 07/18/21. She had OV today and labs drawn. ?  ?Called plan for benefits for Reclast- [id number], [id number] ?  ?Insurance: [employer] ?  ?Phone:  [phone] ?  ?Plan is ACTIVE from 09/16/20. Termination date is 09/15/21. ?  ?Per rep, Reclast ([id number]) does NOT require precertification in the outpatient setting as long as the location is considered in-network. [facility] is in-network with this plan.  Plan pays 70% and patient is responsible for 30% coinsurance. Once her deductible of $3000 is met, patient is responsible for 30% coinsurance. As of 08/14/21, patient has met $2226.30 of deductible. Patient has max OOP of $6000. As of 08/14/21, patient has met $5011.96. Once max OOP is met, insurance will pick up 100% ? ?Ref # [id number] ? ? , PharmD, MPH, BCPS ?Clinical Pharmacist (Rheumatology and Pulmonology) ?

## 2021-08-15 ENCOUNTER — Other Ambulatory Visit: Payer: Self-pay | Admitting: Pharmacist

## 2021-08-15 DIAGNOSIS — Z79899 Other long term (current) drug therapy: Secondary | ICD-10-CM

## 2021-08-15 DIAGNOSIS — M810 Age-related osteoporosis without current pathological fracture: Secondary | ICD-10-CM

## 2021-08-15 LAB — COMPLETE METABOLIC PANEL WITH GFR
AG Ratio: 1.2 (calc) (ref 1.0–2.5)
ALT: 20 U/L (ref 6–29)
AST: 23 U/L (ref 10–35)
Albumin: 4.1 g/dL (ref 3.6–5.1)
Alkaline phosphatase (APISO): 74 U/L (ref 37–153)
BUN: 13 mg/dL (ref 7–25)
CO2: 29 mmol/L (ref 20–32)
Calcium: 9.4 mg/dL (ref 8.6–10.4)
Chloride: 103 mmol/L (ref 98–110)
Creat: 0.56 mg/dL (ref 0.50–1.03)
Globulin: 3.3 g/dL (calc) (ref 1.9–3.7)
Glucose, Bld: 90 mg/dL (ref 65–99)
Potassium: 4.5 mmol/L (ref 3.5–5.3)
Sodium: 141 mmol/L (ref 135–146)
Total Bilirubin: 0.3 mg/dL (ref 0.2–1.2)
Total Protein: 7.4 g/dL (ref 6.1–8.1)
eGFR: 107 mL/min/{1.73_m2} (ref >=60)

## 2021-08-15 LAB — CBC WITH DIFFERENTIAL/PLATELET
Absolute Monocytes: 561 cells/uL (ref 200–950)
Basophils Absolute: 32 cells/uL (ref 0–200)
Basophils Relative: 0.4 %
Eosinophils Absolute: 119 cells/uL (ref 15–500)
Eosinophils Relative: 1.5 %
HCT: 40.1 % (ref 35.0–45.0)
Hemoglobin: 13.1 g/dL (ref 11.7–15.5)
Lymphs Abs: 2338 cells/uL (ref 850–3900)
MCH: 29.5 pg (ref 27.0–33.0)
MCHC: 32.7 g/dL (ref 32.0–36.0)
MCV: 90.3 fL (ref 80.0–100.0)
MPV: 10.2 fL (ref 7.5–12.5)
Monocytes Relative: 7.1 %
Neutro Abs: 4851 cells/uL (ref 1500–7800)
Neutrophils Relative %: 61.4 %
Platelets: 361 10*3/uL (ref 140–400)
RBC: 4.44 10*6/uL (ref 3.80–5.10)
RDW: 16.2 % — ABNORMAL HIGH (ref 11.0–15.0)
Total Lymphocyte: 29.6 %
WBC: 7.9 10*3/uL (ref 3.8–10.8)

## 2021-08-15 LAB — VITAMIN D 25 HYDROXY (VIT D DEFICIENCY, FRACTURES): Vit D, 25-Hydroxy: 42 ng/mL (ref 30–100)

## 2021-08-15 NOTE — Progress Notes (Signed)
Next infusion scheduled for Reclast IV not yet scheduled and due for updated orders. ?Diagnosis:  ? ?Dose: 5mg  IV yearly ? ?Last Clinic Visit: 08/14/21 ?Next Clinic Visit: 11/29/21 ? ?Last infusion: 07/18/20 ? ?Labs: 08/14/21 - wnl ? ?Orders placed for Reclast IV x 1 dose along with premedication of acetaminophen and diphenhydramine to be administered 30 minutes before medication infusion. ? ?Called patient and provided with phone number for: ?[facility] [phone]) [place] ? ?Will follow-up to ensured scheduled and completed ? ?Knox Saliva, PharmD, MPH, BCPS ?Clinical Pharmacist (Rheumatology and Pulmonology) ? ?

## 2021-08-15 NOTE — Telephone Encounter (Signed)
Labs drawn on 08/14/21 wnl to proceed with Reclast infusion. Order placed with [facility]. Called patient to advise and provided with Medical Day number to schedule infusion ? ?Chesley Mires, PharmD, MPH, BCPS ?Clinical Pharmacist (Rheumatology and Pulmonology) ?

## 2021-08-15 NOTE — Progress Notes (Signed)
CBC and CMP WNL.  Vitamin D is WNL-42.

## 2021-08-20 DIAGNOSIS — F4323 Adjustment disorder with mixed anxiety and depressed mood: Secondary | ICD-10-CM | POA: Diagnosis not present

## 2021-08-21 ENCOUNTER — Encounter: Payer: Self-pay | Admitting: Pharmacist

## 2021-08-21 NOTE — Progress Notes (Signed)
MyChart message sent to patient today since Reclast infusion has not yet been scheduled ? ?Chesley Mires, PharmD, MPH, BCPS ?Clinical Pharmacist (Rheumatology and Pulmonology) ?

## 2021-08-22 ENCOUNTER — Other Ambulatory Visit (HOSPITAL_COMMUNITY): Payer: Self-pay

## 2021-08-23 ENCOUNTER — Other Ambulatory Visit (HOSPITAL_COMMUNITY): Payer: Self-pay

## 2021-08-26 NOTE — Progress Notes (Signed)
Reclast scheduled for 09/06/21. Will f/u to ensure completed ? ?Knox Saliva, PharmD, MPH, BCPS ?Clinical Pharmacist (Rheumatology and Pulmonology) ?

## 2021-08-27 ENCOUNTER — Other Ambulatory Visit (HOSPITAL_COMMUNITY): Payer: Self-pay

## 2021-08-29 ENCOUNTER — Other Ambulatory Visit (HOSPITAL_COMMUNITY): Payer: Self-pay

## 2021-09-02 ENCOUNTER — Other Ambulatory Visit (HOSPITAL_COMMUNITY): Payer: Self-pay

## 2021-09-03 DIAGNOSIS — K449 Diaphragmatic hernia without obstruction or gangrene: Secondary | ICD-10-CM | POA: Diagnosis not present

## 2021-09-03 DIAGNOSIS — M069 Rheumatoid arthritis, unspecified: Secondary | ICD-10-CM | POA: Diagnosis not present

## 2021-09-03 DIAGNOSIS — J90 Pleural effusion, not elsewhere classified: Secondary | ICD-10-CM | POA: Diagnosis not present

## 2021-09-06 ENCOUNTER — Ambulatory Visit (HOSPITAL_COMMUNITY)
Admission: RE | Admit: 2021-09-06 | Discharge: 2021-09-06 | Disposition: A | Discharge status: Home / Self Care | Payer: BC Managed Care – PPO | Source: Ambulatory Visit | Attending: Rheumatology | Admitting: Rheumatology

## 2021-09-06 DIAGNOSIS — M810 Age-related osteoporosis without current pathological fracture: Secondary | ICD-10-CM | POA: Diagnosis not present

## 2021-09-06 MED ORDER — ZOLEDRONIC ACID 5 MG/100ML IV SOLN
INTRAVENOUS | Status: AC
  Administered 2021-09-06: 5 mg via INTRAVENOUS
  Filled 2021-09-06: qty 100

## 2021-09-06 MED ORDER — DIPHENHYDRAMINE HCL 25 MG PO CAPS: 25.0000 mg | ORAL_CAPSULE | Freq: Once | ORAL | Status: DC

## 2021-09-06 MED ORDER — ZOLEDRONIC ACID 5 MG/100ML IV SOLN: 5.0000 mg | Freq: Once | INTRAVENOUS | Status: AC

## 2021-09-06 MED ORDER — ACETAMINOPHEN 325 MG PO TABS: 650.0000 mg | ORAL_TABLET | Freq: Once | ORAL | Status: DC

## 2021-09-12 ENCOUNTER — Other Ambulatory Visit (HOSPITAL_COMMUNITY): Payer: Self-pay

## 2021-09-12 DIAGNOSIS — F4323 Adjustment disorder with mixed anxiety and depressed mood: Secondary | ICD-10-CM | POA: Diagnosis not present

## 2021-10-04 ENCOUNTER — Other Ambulatory Visit (HOSPITAL_COMMUNITY): Payer: Self-pay

## 2021-10-08 DIAGNOSIS — F4323 Adjustment disorder with mixed anxiety and depressed mood: Secondary | ICD-10-CM | POA: Diagnosis not present

## 2021-10-15 ENCOUNTER — Other Ambulatory Visit (HOSPITAL_COMMUNITY): Payer: Self-pay

## 2021-10-17 ENCOUNTER — Other Ambulatory Visit: Payer: Self-pay | Admitting: Physician Assistant

## 2021-10-18 NOTE — Telephone Encounter (Signed)
Okay to send in 30 days until the patient has updated her Plaquenil eye exam.

## 2021-10-18 NOTE — Telephone Encounter (Signed)
Next Visit: 11/29/2021  Last Visit: 08/14/2021  Labs: 08/14/2021 CBC and CMP WNL.  Eye exam: not on file. Started PLQ January 2023.    Current Dose per office note 08/14/2021: Plaquenil 200 mg twice daily Monday through Friday.   RT:[id number] arthritis involving multiple sites with positive rheumatoid factor  Last Fill: 06/18/2021  Attempted to contact the patient and left message to advise patient we need her PLQ eye exam.   Okay to refill Plaquenil?

## 2021-10-28 ENCOUNTER — Telehealth: Payer: Self-pay

## 2021-10-28 NOTE — Telephone Encounter (Signed)
Per Rinaldo Ratel, current Prior Authorization is expiring.  Submitted a Prior Authorization request to [facility] for RINVOQ via CoverMyMeds. Will update once we receive a response.   Key: Donna Lopez

## 2021-10-29 ENCOUNTER — Other Ambulatory Visit (HOSPITAL_COMMUNITY): Payer: Self-pay

## 2021-10-31 ENCOUNTER — Other Ambulatory Visit (HOSPITAL_COMMUNITY): Payer: Self-pay

## 2021-10-31 NOTE — Telephone Encounter (Signed)
Received notification from [facility] regarding a prior authorization for [facility]. Authorization has been APPROVED from 10/28/2021 through 10/27/2022  Patient can continue to fill through [facility]: [phone]   Authorization # [facility]  Chesley Mires, PharmD, MPH, BCPS, CPP Clinical Pharmacist (Rheumatology and Pulmonology)

## 2021-11-01 ENCOUNTER — Other Ambulatory Visit (HOSPITAL_COMMUNITY): Payer: Self-pay

## 2021-11-04 ENCOUNTER — Other Ambulatory Visit (HOSPITAL_COMMUNITY): Payer: Self-pay

## 2021-11-06 ENCOUNTER — Other Ambulatory Visit (HOSPITAL_COMMUNITY): Payer: Self-pay

## 2021-11-07 ENCOUNTER — Telehealth: Payer: Self-pay | Admitting: Rheumatology

## 2021-11-07 ENCOUNTER — Other Ambulatory Visit: Payer: Self-pay | Admitting: Physician Assistant

## 2021-11-07 ENCOUNTER — Other Ambulatory Visit (HOSPITAL_COMMUNITY): Payer: Self-pay

## 2021-11-07 DIAGNOSIS — Z79899 Other long term (current) drug therapy: Secondary | ICD-10-CM

## 2021-11-07 DIAGNOSIS — Z111 Encounter for screening for respiratory tuberculosis: Secondary | ICD-10-CM

## 2021-11-07 DIAGNOSIS — M0579 Rheumatoid arthritis with rheumatoid factor of multiple sites without organ or systems involvement: Secondary | ICD-10-CM

## 2021-11-07 MED ORDER — RINVOQ 15 MG PO TB24
15.0000 mg | ORAL_TABLET | Freq: Every day | ORAL | 0 refills | Status: DC
  Filled 2021-11-07: qty 30, 30d supply, fill #0

## 2021-11-07 MED ORDER — OTREXUP 25 MG/0.4ML ~~LOC~~ SOAJ
SUBCUTANEOUS | 0 refills | Status: DC
  Filled 2021-11-07: qty 1.6, 28d supply, fill #0

## 2021-11-07 NOTE — Addendum Note (Signed)
Addended by: Henriette Combs on: 11/07/2021 03:30 PM   Modules accepted: Orders

## 2021-11-07 NOTE — Telephone Encounter (Signed)
Patient called the office requesting lab orders be sent to [facility] in [place]. Patient requests a call back when they have been released.

## 2021-11-07 NOTE — Telephone Encounter (Signed)
Lab Orders released and patient advised.  °

## 2021-11-07 NOTE — Telephone Encounter (Signed)
Next Visit: 11/29/2021   Last Visit: 08/14/2021   Labs: 08/14/2021 CBC and CMP WNL.  TB Gold: 5/27/52022  Current Dose per office note 08/14/2021: Rinvoq 15 mg 1 tablet by mouth daily methotrexate 25 mg sq injections once weekly   RF:[id number] arthritis involving multiple sites with positive rheumatoid factor   Last Fill: 08/05/2021  Left message to advise patient she is due to update labs.   Okay to refill Rinvoq and Otrexup?

## 2021-11-08 DIAGNOSIS — Z79899 Other long term (current) drug therapy: Secondary | ICD-10-CM | POA: Diagnosis not present

## 2021-11-08 DIAGNOSIS — Z111 Encounter for screening for respiratory tuberculosis: Secondary | ICD-10-CM | POA: Diagnosis not present

## 2021-11-13 ENCOUNTER — Other Ambulatory Visit (HOSPITAL_COMMUNITY): Payer: Self-pay

## 2021-11-14 ENCOUNTER — Other Ambulatory Visit (HOSPITAL_COMMUNITY): Payer: Self-pay

## 2021-11-14 LAB — CMP14+EGFR
ALT: 26 IU/L (ref 0–32)
AST: 28 IU/L (ref 0–40)
Albumin/Globulin Ratio: 1.4 (ref 1.2–2.2)
Albumin: 4.3 g/dL (ref 3.8–4.9)
Alkaline Phosphatase: 69 IU/L (ref 44–121)
BUN/Creatinine Ratio: 25 — ABNORMAL HIGH (ref 9–23)
BUN: 17 mg/dL (ref 6–24)
Bilirubin Total: 0.3 mg/dL (ref 0.0–1.2)
CO2: 22 mmol/L (ref 20–29)
Calcium: 9.5 mg/dL (ref 8.7–10.2)
Chloride: 102 mmol/L (ref 96–106)
Creatinine, Ser: 0.67 mg/dL (ref 0.57–1.00)
Globulin, Total: 3.1 g/dL (ref 1.5–4.5)
Glucose: 113 mg/dL — ABNORMAL HIGH (ref 70–99)
Potassium: 4.3 mmol/L (ref 3.5–5.2)
Sodium: 141 mmol/L (ref 134–144)
Total Protein: 7.4 g/dL (ref 6.0–8.5)
eGFR: 103 mL/min/{1.73_m2} (ref >59)

## 2021-11-14 LAB — QUANTIFERON-TB GOLD PLUS
QuantiFERON Mitogen Value: >10 IU/mL
QuantiFERON Nil Value: 0 IU/mL
QuantiFERON TB1 Ag Value: 0 IU/mL
QuantiFERON TB2 Ag Value: 0 IU/mL
QuantiFERON-TB Gold Plus: NEGATIVE

## 2021-11-14 LAB — CBC WITH DIFFERENTIAL/PLATELET
Basophils Absolute: 0 10*3/uL (ref 0.0–0.2)
Basos: 0 %
EOS (ABSOLUTE): 0.1 10*3/uL (ref 0.0–0.4)
Eos: 1 %
Hematocrit: 38.5 % (ref 34.0–46.6)
Hemoglobin: 12.6 g/dL (ref 11.1–15.9)
Immature Grans (Abs): 0 10*3/uL (ref 0.0–0.1)
Immature Granulocytes: 0 %
Lymphocytes Absolute: 2.4 10*3/uL (ref 0.7–3.1)
Lymphs: 24 %
MCH: 29.7 pg (ref 26.6–33.0)
MCHC: 32.7 g/dL (ref 31.5–35.7)
MCV: 91 fL (ref 79–97)
Monocytes Absolute: 0.9 10*3/uL (ref 0.1–0.9)
Monocytes: 9 %
Neutrophils Absolute: 6.5 10*3/uL (ref 1.4–7.0)
Neutrophils: 66 %
Platelets: 326 10*3/uL (ref 150–450)
RBC: 4.24 x10E6/uL (ref 3.77–5.28)
RDW: 15.6 % — ABNORMAL HIGH (ref 11.7–15.4)
WBC: 10 10*3/uL (ref 3.4–10.8)

## 2021-11-14 NOTE — Progress Notes (Signed)
TB gold negative

## 2021-11-15 ENCOUNTER — Other Ambulatory Visit (HOSPITAL_COMMUNITY): Payer: Self-pay

## 2021-11-22 DIAGNOSIS — F4323 Adjustment disorder with mixed anxiety and depressed mood: Secondary | ICD-10-CM | POA: Diagnosis not present

## 2021-11-29 ENCOUNTER — Ambulatory Visit: Payer: BC Managed Care – PPO | Admitting: Rheumatology

## 2021-12-03 ENCOUNTER — Other Ambulatory Visit (HOSPITAL_COMMUNITY): Payer: Self-pay

## 2021-12-05 ENCOUNTER — Other Ambulatory Visit (HOSPITAL_COMMUNITY): Payer: Self-pay

## 2021-12-06 ENCOUNTER — Other Ambulatory Visit (HOSPITAL_COMMUNITY): Payer: Self-pay

## 2021-12-10 ENCOUNTER — Other Ambulatory Visit (HOSPITAL_COMMUNITY): Payer: Self-pay

## 2021-12-10 ENCOUNTER — Other Ambulatory Visit: Payer: Self-pay | Admitting: Physician Assistant

## 2021-12-10 DIAGNOSIS — Z1331 Encounter for screening for depression: Secondary | ICD-10-CM | POA: Diagnosis not present

## 2021-12-10 DIAGNOSIS — R053 Chronic cough: Secondary | ICD-10-CM | POA: Diagnosis not present

## 2021-12-10 DIAGNOSIS — M0579 Rheumatoid arthritis with rheumatoid factor of multiple sites without organ or systems involvement: Secondary | ICD-10-CM

## 2021-12-10 MED ORDER — OTREXUP 25 MG/0.4ML ~~LOC~~ SOAJ
SUBCUTANEOUS | 2 refills | Status: DC
  Filled 2021-12-10: qty 1.6, 28d supply, fill #0

## 2021-12-10 MED ORDER — RINVOQ 15 MG PO TB24
15.0000 mg | ORAL_TABLET | Freq: Every day | ORAL | 2 refills | Status: DC
  Filled 2021-12-10 – 2021-12-11 (×2): qty 30, 30d supply, fill #0

## 2021-12-10 NOTE — Telephone Encounter (Signed)
Next Visit: 12/25/2021   Last Visit: 08/14/2021   Labs: 11/08/2021 CMP is normal except the glucose is mildly elevated probably not a fasting sample. CBC WNL   TB Gold: 11/08/2021 Neg    Current Dose per office note 08/14/2021: Rinvoq 15 mg 1 tablet by mouth daily methotrexate 25 mg sq injections once weekly   RK:[id number] arthritis involving multiple sites with positive rheumatoid factor   Last Fill: 11/07/2021 (30 day supply)   Okay to refill Rinvoq and Otrexup?

## 2021-12-11 ENCOUNTER — Other Ambulatory Visit (HOSPITAL_COMMUNITY): Payer: Self-pay

## 2021-12-11 NOTE — Progress Notes (Signed)
Office Visit Note  Patient: Donna Lopez             Date of Birth: 1965/05/22           MRN: [id number]             PCP: Michael Boston, MD Referring: [name], * Visit Date: 12/25/2021 Occupation: @GUAROCC @  Subjective:  Back pain  History of Present Illness: Donna Lopez is a 56 y.o. female with history of seropositive rheumatoid arthritis, osteoarthritis, degenerative disc disease and osteoporosis.  She states she has been doing well as regards to rheumatoid arthritis.  She has been taking Rinvoq 15 mg p.o. daily and methotrexate 25 mg subcu weekly along with folic acid.  She ran out of Plaquenil about 2 weeks ago.  She was taking only 1 tablet a day.  She does not think it has makes much difference in controlling her arthritis symptoms.  She had a lower back strain few weeks ago which is gradually improving.  She is currently not having any discomfort in her knee joints.  The left knee has been replaced.  Was evaluated by Dr. Ronn Melena at [facility] where he felt that the pleural effusion was related to rheumatoid arthritis and no further intervention was needed.  Activities of Daily Living:  Patient reports morning stiffness for a few minutes.   Patient Denies nocturnal pain.  Difficulty dressing/grooming: Denies Difficulty climbing stairs: Denies Difficulty getting out of chair: Denies Difficulty using hands for taps, buttons, cutlery, and/or writing: Denies  Review of Systems  Constitutional:  Positive for fatigue.  HENT:  Negative for mouth sores and mouth dryness.   Eyes:  Negative for dryness.  Respiratory:  Positive for shortness of breath.   Cardiovascular:  Negative for chest pain and palpitations.  Gastrointestinal:  Negative for blood in stool, constipation and diarrhea.  Endocrine: Negative for increased urination.  Genitourinary:  Negative for involuntary urination.  Musculoskeletal:  Positive for myalgias, morning stiffness, muscle tenderness and  myalgias. Negative for joint pain, joint pain, joint swelling and muscle weakness.  Skin:  Negative for color change, rash, hair loss and sensitivity to sunlight.  Allergic/Immunologic: Negative for susceptible to infections.  Neurological:  Negative for dizziness and headaches.  Hematological:  Negative for swollen glands.  Psychiatric/Behavioral:  Negative for depressed mood and sleep disturbance. The patient is not nervous/anxious.     PMFS History:  Patient Active Problem List   Diagnosis Date Noted   Age-related osteoporosis without current pathological fracture 08/15/2021   Pleural effusion on left 04/22/2021   Preventative health care 04/01/2021   Unilateral primary osteoarthritis, left knee 12/17/2018   Status post total left knee replacement 12/17/2018   Primary osteoarthritis of left knee 11/23/2018   Carpal tunnel syndrome, left upper limb 11/23/2018   BMI 40.0-44.9, adult ([place]) 07/12/2018   Primary osteoarthritis of both knees 12/24/2016   DDD (degenerative disc disease), thoracic 11/17/2016   DDD (degenerative disc disease), lumbar 11/17/2016   Vitamin D deficiency 11/17/2016   History of rosacea/ History of acne 11/17/2016   High risk medications (not anticoagulants) long-term use 06/25/2016   History of degenerative disc disease 06/25/2016   Severe obesity (BMI >= 40) ([place]) 06/13/2014   Left-sided thoracic back pain 07/19/2013   PALPITATIONS 10/17/2009   UTI 05/24/2009   Rheumatoid arthritis ([place]) 05/24/2009   Hair loss 02/22/2009   VAGINAL BLEEDING 10/05/2008   Carrier of group B Streptococcus 07/28/2007   GERD 12/28/2006  CARDIAC MURMUR, HX OF 12/28/2006   HIATAL HERNIA, HX OF 12/28/2006    Past Medical History:  Diagnosis Date   Allergy    Arthritis    Carpal tunnel syndrome 10/2018   DDD (degenerative disc disease), lumbar    DDD (degenerative disc disease), thoracic    Family history of adverse reaction to anesthesia    Mothers sister never woke up  from surgery   GERD (gastroesophageal reflux disease)    Grade I diastolic dysfunction 0000000   Heart murmur    Heart palpitations    History of   History of hiatal hernia    OA (osteoarthritis)    Obese    Osteoporosis    Rheumatoid arthritis ([place])    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    Vitamin D deficiency     Family History  Problem Relation Age of Onset   Uterine cancer Mother    Diabetes Father    Kidney cancer Father    Colon cancer Sister 6   Rectal cancer Sister    Colon cancer Paternal Grandfather    Colon polyps Brother    Cancer Sister        breast    Healthy Daughter    Esophageal cancer Neg Hx    Stomach cancer Neg Hx    Past Surgical History:  Procedure Laterality Date   COLONOSCOPY  2014   DB- normal    KNEE ARTHROPLASTY     THORACENTESIS  04/2021   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   Procedure: LEFT TOTAL KNEE ARTHROPLASTY;  Surgeon: Mcarthur Rossetti, MD;  Location: [facility];  Service: Orthopedics;  Laterality: Left;   UPPER GASTROINTESTINAL ENDOSCOPY     Social History   Social History Narrative   Exercise--- sometimes walks 1x a week   Immunization History  Administered Date(s) Administered   Influenza Whole 02/08/2009   Influenza,inj,Quad PF,6+ Mos 03/09/2013, 06/13/2014, 06/16/2016, 02/14/2019, 03/30/2020, 04/01/2021   PFIZER(Purple Top)SARS-COV-2 Vaccination 08/26/2019, 09/27/2019, 05/30/2020   PNEUMOCOCCAL CONJUGATE-20 04/01/2021   Td 09/29/2001   Tdap 06/13/2014     Objective: Vital Signs: BP 112/75 (BP Location: Left Arm, Patient Position: Sitting, Cuff Size: Large)   Pulse 85   Ht 5' 2.5" (1.588 m)   Wt 248 lb (112.5 kg)   LMP 08/18/2018   BMI 44.64 kg/m    Physical Exam Vitals and nursing note reviewed.  Constitutional:      Appearance: She is well-developed.  HENT:     Head: Normocephalic and atraumatic.  Eyes:     Conjunctiva/sclera: Conjunctivae normal.  Cardiovascular:     Rate and Rhythm: Normal rate and regular  rhythm.     Heart sounds: Normal heart sounds.  Pulmonary:     Effort: Pulmonary effort is normal.     Breath sounds: Normal breath sounds.  Abdominal:     General: Bowel sounds are normal.     Palpations: Abdomen is soft.  Musculoskeletal:     Cervical back: Normal range of motion.  Lymphadenopathy:     Cervical: No cervical adenopathy.  Skin:    General: Skin is warm and dry.     Capillary Refill: Capillary refill takes less than 2 seconds.  Neurological:     Mental Status: She is alert and oriented to person, place, and time.  Psychiatric:        Behavior: Behavior normal.      Musculoskeletal Exam: C-spine was in good range of motion.  She had thoracic kyphosis.  Shoulder joints, elbow joints, wrist  joints, MCPs PIPs and DIPs with good range of motion.  She had bilateral MCP thickening but no synovitis was noted.  Hip joints and knee joints with good range of motion.  Left knee joint is replaced and was in good range of motion.  She had no tenderness over ankles or MTPs.  CDAI Exam: CDAI Score: 0.4  Patient Global: 2 mm; Provider Global: 2 mm Swollen: 0 ; Tender: 0  Joint Exam 12/25/2021   No joint exam has been documented for this visit   There is currently no information documented on the homunculus. Go to the Rheumatology activity and complete the homunculus joint exam.  Investigation: No additional findings.  Imaging: No results found.  Recent Labs: Lab Results  Component Value Date   WBC 10.0 11/08/2021   HGB 12.6 11/08/2021   PLT 326 11/08/2021   NA 141 11/08/2021   K 4.3 11/08/2021   CL 102 11/08/2021   CO2 22 11/08/2021   GLUCOSE 113 (H) 11/08/2021   BUN 17 11/08/2021   CREATININE 0.67 11/08/2021   BILITOT 0.3 11/08/2021   ALKPHOS 69 11/08/2021   AST 28 11/08/2021   ALT 26 11/08/2021   PROT 7.4 11/08/2021   ALBUMIN 4.3 11/08/2021   CALCIUM 9.5 11/08/2021   GFRAA 114 10/12/2020   QFTBGOLDPLUS Negative 11/08/2021    Speciality Comments:  Humira- quit working Simponi- inadequate response Orencia-since 07/2019 Reclast start 07/18/20, September 06, 2021  Procedures:  No procedures performed Allergies: Penicillins   Assessment / Plan:     Visit Diagnoses: Rheumatoid arthritis involving multiple sites with positive rheumatoid factor (HCC) - Positive rheumatoid factor, positive anti-CCP, erosive disease: Patient had no synovitis on examination.  She has severe erosive rheumatoid arthritis.  She has been doing well on the combination of Rinvoq and methotrexate.  She stopped her hydroxychloroquine.  She was taking only 1 tablet a day as she was forgetting it frequently.  Will keep her off hydroxychloroquine and monitor over the next few months.  If she starts having increased pain and discomfort then we can add hydroxychloroquine.  She has not experienced any joint swelling.  High risk medications (not anticoagulants) long-term use - Rinvoq 15 mg 1 tablet by mouth daily (started in July 2022), methotrexate 25 mg sq injections once weekly, (Plaquenil 200 mg twice daily Monday through Friday-discontinued in July 2023).  Labs obtained on June 2023 were reviewed which were within normal limits.  TB Gold was negative in June 2023.  She was advised to get labs in September and every 3 months to monitor for drug toxicity.  Information regarding immunization was placed in the AVS.  She was also advised to hold Rinvoq and methotrexate if she develops an infection or she has a surgery.  She may resume the medications after the infection resolves.MACE warning on Rinvoq was also reviewed.  Information was placed in the AVS.  She has been taking aspirin 81 mg p.o. daily.  Last lipid panel was in November 2022.  We will get lipid panel with the next labs.  Pleural effusion, left - Chest x-ray on 04/16/21: findings: small left effusion and bibasilar atelectasis.  Underwent thoracentesis on 04/25/2021.  She was also evaluated at [facility] by Dr. Nada Boozer who agreed that  the effusion was related to rheumatoid arthritis.  He did not advise any further treatment.  Primary osteoarthritis of right knee-she had good range of motion without any warmth swelling or effusion.  S/P total knee arthroplasty, left-she had good range of motion  without discomfort.  DDD (degenerative disc disease), thoracic-she had thoracic kyphosis without any point tenderness.  DDD (degenerative disc disease), lumbar-she was experiencing increased lower back pain which has resolved now.  She takes methocarbamol only on.  Basis.  Acute right-sided low back pain without sciatica  Age-related osteoporosis without current pathological fracture - DEXA on 02/02/20: Left femur neck BMD 0.437 with T-score -3.7. Reclast IV on July 18, 2020 and 09/06/2021.  Will schedule repeat DEXA scan in October 2023.  Use of calcium rich diet and vitamin D was advised.  Vitamin D deficiency-she is on vitamin D supplement.  Vitamin D was 42 on August 14, 2021.  History of gastroesophageal reflux (GERD)  History of rosacea/ History of acne  History of hiatal hernia  Orders: Orders Placed This Encounter  Procedures   DG Bone Density   No orders of the defined types were placed in this encounter.    Follow-Up Instructions: Return in about 3 months (around 03/27/2022) for Rheumatoid arthritis.   Donna Savoy, MD  Note - This record has been created using Animal nutritionist.  Chart creation errors have been sought, but may not always  have been located. Such creation errors do not reflect on  the standard of medical care.

## 2021-12-12 DIAGNOSIS — F4323 Adjustment disorder with mixed anxiety and depressed mood: Secondary | ICD-10-CM | POA: Diagnosis not present

## 2021-12-18 ENCOUNTER — Other Ambulatory Visit: Payer: Self-pay | Admitting: Pharmacist

## 2021-12-18 ENCOUNTER — Other Ambulatory Visit (HOSPITAL_COMMUNITY): Payer: Self-pay

## 2021-12-18 DIAGNOSIS — M0579 Rheumatoid arthritis with rheumatoid factor of multiple sites without organ or systems involvement: Secondary | ICD-10-CM

## 2021-12-18 MED ORDER — OTREXUP 25 MG/0.4ML ~~LOC~~ SOAJ
SUBCUTANEOUS | 1 refills | Status: DC
  Filled 2021-12-18: qty 1.6, fill #0
  Filled 2022-01-03: qty 1.6, 28d supply, fill #0
  Filled 2022-02-03: qty 1.6, 28d supply, fill #1

## 2021-12-18 MED ORDER — RINVOQ 15 MG PO TB24
15.0000 mg | ORAL_TABLET | Freq: Every day | ORAL | 1 refills | Status: DC
  Filled 2021-12-18 – 2022-01-03 (×2): qty 30, 30d supply, fill #0
  Filled 2022-02-03: qty 30, 30d supply, fill #1

## 2021-12-18 NOTE — Telephone Encounter (Signed)
Rx for Rinvoq 15mg  daily and Otrexup 25mg  SQ weekly sent to [facility] for hospital-based cost pricing.   Total qty remaining is 3.2 ml of otrexup and 60 tabs of Rinvoq  , PharmD, MPH, BCPS, CPP Clinical Pharmacist (Rheumatology and Pulmonology)

## 2021-12-25 ENCOUNTER — Ambulatory Visit: Payer: BC Managed Care – PPO | Attending: Rheumatology | Admitting: Rheumatology

## 2021-12-25 ENCOUNTER — Other Ambulatory Visit: Payer: Self-pay

## 2021-12-25 ENCOUNTER — Encounter: Payer: Self-pay | Admitting: Rheumatology

## 2021-12-25 VITALS — BP 112/75 | HR 85 | Ht 62.5 in | Wt 248.0 lb

## 2021-12-25 DIAGNOSIS — Z79899 Other long term (current) drug therapy: Secondary | ICD-10-CM

## 2021-12-25 DIAGNOSIS — E559 Vitamin D deficiency, unspecified: Secondary | ICD-10-CM

## 2021-12-25 DIAGNOSIS — M1711 Unilateral primary osteoarthritis, right knee: Secondary | ICD-10-CM | POA: Diagnosis not present

## 2021-12-25 DIAGNOSIS — J90 Pleural effusion, not elsewhere classified: Secondary | ICD-10-CM | POA: Diagnosis not present

## 2021-12-25 DIAGNOSIS — Z96652 Presence of left artificial knee joint: Secondary | ICD-10-CM

## 2021-12-25 DIAGNOSIS — M5136 Other intervertebral disc degeneration, lumbar region: Secondary | ICD-10-CM

## 2021-12-25 DIAGNOSIS — M0579 Rheumatoid arthritis with rheumatoid factor of multiple sites without organ or systems involvement: Secondary | ICD-10-CM

## 2021-12-25 DIAGNOSIS — M545 Low back pain, unspecified: Secondary | ICD-10-CM

## 2021-12-25 DIAGNOSIS — M5134 Other intervertebral disc degeneration, thoracic region: Secondary | ICD-10-CM

## 2021-12-25 DIAGNOSIS — Z872 Personal history of diseases of the skin and subcutaneous tissue: Secondary | ICD-10-CM

## 2021-12-25 DIAGNOSIS — M810 Age-related osteoporosis without current pathological fracture: Secondary | ICD-10-CM

## 2021-12-25 DIAGNOSIS — Z8719 Personal history of other diseases of the digestive system: Secondary | ICD-10-CM

## 2021-12-25 NOTE — Patient Instructions (Addendum)
Standing Labs We placed an order today for your standing lab work.   Please have your standing labs drawn in September and every 3 months  If possible, please have your labs drawn 2 weeks prior to your appointment so that the provider can discuss your results at your appointment.  Please note that you may see your imaging and lab results in MyChart before we have reviewed them. We may be awaiting multiple results to interpret others before contacting you. Please allow our office up to 72 hours to thoroughly review all of the results before contacting the office for clarification of your results.  We have open lab daily: Monday through Thursday from 1:30-4:30 PM and Friday from 1:30-4:00 PM at the office of Dr. Pollyann Savoy, [facility].   Please be advised, all patients with office appointments requiring lab work will take precedent over walk-in lab work.  If possible, please come for your lab work on Monday and Friday afternoons, as you may experience shorter wait times. The office is located at [address], [place], [place] [address] No appointment is necessary.   Labs are drawn by [facility]. Please bring your co-pay at the time of your lab draw.  You may receive a bill from [facility] for your lab work.  Please note if you are on Hydroxychloroquine and and an order has been placed for a Hydroxychloroquine level, you will need to have it drawn 4 hours or more after your last dose.  If you wish to have your labs drawn at another location, please call the office 24 hours in advance to send orders.  If you have any questions regarding directions or hours of operation,  please call [phone].   As a reminder, please drink plenty of water prior to coming for your lab work. Thanks!   Vaccines You are taking a medication(s) that can suppress your immune system.  The following immunizations are recommended: Flu annually Covid-19  Td/Tdap (tetanus, diphtheria,  pertussis) every 10 years Pneumonia (Prevnar 15 then Pneumovax 23 at least 1 year apart.  Alternatively, can take Prevnar 20 without needing additional dose) Shingrix: 2 doses from 4 weeks to 6 months apart  Please check with your PCP to make sure you are up to date.  If you have signs or symptoms of an infection or start antibiotics: First, call your PCP for workup of your infection. Hold your medication through the infection, until you complete your antibiotics, and until symptoms resolve if you take the following: Injectable medication (Actemra, Benlysta, Cimzia, Cosentyx, Enbrel, Humira, Kevzara, Orencia, Remicade, Simponi, Stelara, Taltz, Tremfya) Methotrexate Leflunomide (Arava) Mycophenolate (Cellcept) Osborne [place], or Rinvoq   Because you are taking Harriette Ohara, Rinvoq, or Olumiant, it is very important to know that this class of medications has a FDA BLACK BOX WARNING for major adverse cardiovascular events (MACE), thrombosis, mortality (including sudden cardiovascular death), serious infections, and lymphomas. MACE is defined as cardiovascular death, myocardial infarction, and stroke. Thrombosis includes deep venous thrombosis (DVT), pulmonary embolism (PE), and arterial thrombosis. If you are a current or former smoker, you are at higher risk for MACE.

## 2021-12-31 ENCOUNTER — Other Ambulatory Visit (HOSPITAL_COMMUNITY): Payer: Self-pay

## 2022-01-03 ENCOUNTER — Other Ambulatory Visit (HOSPITAL_COMMUNITY): Payer: Self-pay

## 2022-01-07 DIAGNOSIS — F4323 Adjustment disorder with mixed anxiety and depressed mood: Secondary | ICD-10-CM | POA: Diagnosis not present

## 2022-01-09 ENCOUNTER — Other Ambulatory Visit (HOSPITAL_COMMUNITY): Payer: Self-pay

## 2022-01-30 ENCOUNTER — Other Ambulatory Visit (HOSPITAL_COMMUNITY): Payer: Self-pay

## 2022-02-03 ENCOUNTER — Other Ambulatory Visit (HOSPITAL_COMMUNITY): Payer: Self-pay

## 2022-02-04 ENCOUNTER — Telehealth: Payer: Self-pay | Admitting: Gastroenterology

## 2022-02-04 NOTE — Telephone Encounter (Signed)
Agree with EGD for further evaluation of persistent GERD symptoms despite twice daily PPI.  If she has not had any changes in her health history in the past year, we can schedule patient as direct.  Thank you

## 2022-02-04 NOTE — Telephone Encounter (Signed)
Inbound call from patient requesting a call back. Patient didn't want to give any details as to why she needed to speak with a nurse.

## 2022-02-04 NOTE — Telephone Encounter (Signed)
Patient seen 2022 for worsening dysphagia and GERD. She was scheduled for an EGD but chose to cancel due to a lack of insurance. Called the patient back. No answer. Left a message of my call on her voicemail.

## 2022-02-04 NOTE — Telephone Encounter (Signed)
Spoke with the patient. She continues to have reflux, a burning sensation and globus sensation with throat clearing and hoarseness. She is taking OTC Nexium BID to total 40 mg daily. She asks if she can reschedule the EGD that was planned last spring.

## 2022-02-05 NOTE — Telephone Encounter (Signed)
Spoke with patient. No surgeries or new health problems since she was last seen. She takes ASA daily under the direction of a physician.  Appointment with the nurse 02/18/22 and EGD 02/24/22 scheduled. She is advised she will need a driver and care partner on the day of the procedure.

## 2022-02-10 ENCOUNTER — Other Ambulatory Visit (HOSPITAL_COMMUNITY): Payer: Self-pay

## 2022-02-11 DIAGNOSIS — F4323 Adjustment disorder with mixed anxiety and depressed mood: Secondary | ICD-10-CM | POA: Diagnosis not present

## 2022-02-17 ENCOUNTER — Encounter: Payer: BC Managed Care – PPO | Admitting: Gastroenterology

## 2022-02-18 ENCOUNTER — Ambulatory Visit (AMBULATORY_SURGERY_CENTER): Payer: Self-pay

## 2022-02-18 ENCOUNTER — Encounter: Payer: Self-pay | Admitting: Gastroenterology

## 2022-02-18 VITALS — Ht 62.0 in | Wt 248.0 lb

## 2022-02-18 DIAGNOSIS — K219 Gastro-esophageal reflux disease without esophagitis: Secondary | ICD-10-CM

## 2022-02-18 NOTE — Progress Notes (Signed)
No egg or soy allergy known to patient  No issues known to pt with past sedation with any surgeries or procedures Patient denies ever being told they had issues or difficulty with intubation  No FH of Malignant Hyperthermia Pt is not on diet pills Pt is not on  home 02  Pt is not on blood thinners  Pt denies issues with constipation  No A fib or A flutter Have any cardiac testing pending--no   

## 2022-02-24 ENCOUNTER — Ambulatory Visit (AMBULATORY_SURGERY_CENTER): Payer: BC Managed Care – PPO | Admitting: Gastroenterology

## 2022-02-24 ENCOUNTER — Encounter: Payer: Self-pay | Admitting: Gastroenterology

## 2022-02-24 VITALS — BP 137/88 | HR 82 | Temp 97.5°F | Resp 16 | Ht 62.5 in | Wt 248.0 lb

## 2022-02-24 DIAGNOSIS — K2970 Gastritis, unspecified, without bleeding: Secondary | ICD-10-CM | POA: Diagnosis not present

## 2022-02-24 DIAGNOSIS — K219 Gastro-esophageal reflux disease without esophagitis: Secondary | ICD-10-CM | POA: Diagnosis not present

## 2022-02-24 DIAGNOSIS — K319 Disease of stomach and duodenum, unspecified: Secondary | ICD-10-CM | POA: Diagnosis not present

## 2022-02-24 DIAGNOSIS — K259 Gastric ulcer, unspecified as acute or chronic, without hemorrhage or perforation: Secondary | ICD-10-CM | POA: Diagnosis not present

## 2022-02-24 MED ORDER — SODIUM CHLORIDE 0.9 % IV SOLN: 500.0000 mL | INTRAVENOUS | Status: DC

## 2022-02-24 NOTE — Op Note (Signed)
[facility] Patient Name: Khloi Speare Procedure Date: 02/24/2022 2:30 PM MRN: [id number] Endoscopist: Mauri Pole , MD Age: 56 Referring MD:  Date of Birth: [birth year] Gender: Female Account #: [id number] Procedure:                Upper GI endoscopy Indications:              Dysphagia, Esophageal reflux symptoms that persist                            despite appropriate therapy Medicines:                Monitored Anesthesia Care Procedure:                Pre-Anesthesia Assessment:                           - Prior to the procedure, a History and Physical                            was performed, and patient medications and                            allergies were reviewed. The patient's tolerance of                            previous anesthesia was also reviewed. The risks                            and benefits of the procedure and the sedation                            options and risks were discussed with the patient.                            All questions were answered, and informed consent                            was obtained. Prior Anticoagulants: The patient has                            taken no previous anticoagulant or antiplatelet                            agents. ASA Grade Assessment: III - A patient with                            severe systemic disease. After reviewing the risks                            and benefits, the patient was deemed in                            satisfactory condition to undergo the procedure.  After obtaining informed consent, the endoscope was                            passed under direct vision. Throughout the                            procedure, the patient's blood pressure, pulse, and                            oxygen saturations were monitored continuously. The                            Endoscope was introduced through the mouth, and                            advanced to the  second part of duodenum. The upper                            GI endoscopy was accomplished without difficulty.                            The patient tolerated the procedure well. Scope In: Scope Out: Findings:                 The Z-line was regular and was found 33 cm from the                            incisors.                           The gastroesophageal flap valve was visualized                            endoscopically and classified as Hill Grade III                            (minimal fold, loose to endoscope, hiatal hernia                            likely).                           A 7 cm hiatal hernia was found. The proximal extent                            of the gastric folds (end of tubular esophagus) was                            35 cm from the incisors. The hiatal narrowing was                            42 cm from the incisors. The Z-line was 33 cm from  the incisors.                           Patchy mild inflammation characterized by                            congestion (edema), erosions and erythema was found                            in the gastric antrum and in the prepyloric region                            of the stomach. Biopsies were taken with a cold                            forceps for Helicobacter pylori testing.                           The exam of the stomach was otherwise normal.                           The examined duodenum was normal. Complications:            No immediate complications. Estimated Blood Loss:     Estimated blood loss was minimal. Impression:               - Z-line regular, 33 cm from the incisors.                           - Gastroesophageal flap valve classified as Hill                            Grade III (minimal fold, loose to endoscope, hiatal                            hernia likely).                           - 7 cm hiatal hernia.                           - Gastritis. Biopsied.                            - Normal examined duodenum. Recommendation:           - Patient has a contact number available for                            emergencies. The signs and symptoms of potential                            delayed complications were discussed with the                            patient. Return to normal activities tomorrow.  Written discharge instructions were provided to the                            patient.                           - Resume previous diet.                           - Continue present medications.                           - Await pathology results.                           - CT chest without contrast to further evaluate the                            hiatal hernia                           - Esophageal manometry to be scheduled at next                            available appointment Mauri Pole, MD 02/24/2022 3:03:50 PM This report has been signed electronically.

## 2022-02-24 NOTE — Progress Notes (Signed)
Pt in recovery with monitors in place, VSS. Report given to receiving RN. Bite guard was placed with pt awake to ensure comfort. No dental or soft tissue damage noted. 

## 2022-02-24 NOTE — Progress Notes (Signed)
Pt's states no medical or surgical changes since previsit or office visit. 

## 2022-02-24 NOTE — Progress Notes (Signed)
[facility] History and Physical   Primary Care Physician:  Michael Boston, MD   Reason for Procedure:  Dysphagia, GERD  Plan:    EGD with possible interventions as needed     HPI: Donna Lopez is a very pleasant 56 y.o. female here for EGD for evaluation of worsening GERD symptoms and dysphagia. Will need to exclude erosive esophagitis and peptic stricture.  The risks and benefits as well as alternatives of endoscopic procedure(s) have been discussed and reviewed. All questions answered. The patient agrees to proceed.    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 Authorizing Provider  aspirin 81 MG chewable tablet Chew 1 tablet (81 mg total) by mouth 2 (two) times daily. Patient taking differently: Chew 81 mg by mouth daily. 12/18/18  Yes Mcarthur Rossetti, MD  Calcium Carb-Cholecalciferol (CALCIUM + D3) 600-200 MG-UNIT TABS Take 1 capsule by mouth daily.   Yes [provider]   Cholecalciferol (VITAMIN D) 50 MCG (2000 UT) CAPS Take 2,000 Units by mouth daily.   Yes [provider]  Esomeprazole Magnesium (NEXIUM PO) Take 20 mg by mouth 2 (two) times daily.   Yes [provider]  folic acid (FOLVITE) 1 MG tablet TAKE 2 TABLETS BY MOUTH EVERY DAY 07/05/21  Yes Ofilia Neas, PA-C  Glucos-Chondroit-Hyaluron-MSM (GLUCOSAMINE CHONDROITIN JOINT PO) Take 1 tablet by mouth daily.   Yes [provider]  L-Lysine 1000 MG TABS Take 1,000 mg by mouth daily.    Yes [provider]  Methotrexate, PF, (OTREXUP) 25 MG/0.4ML SOAJ INJECT 25 MG INTO THE SKIN EVERY 7 DAYS. 12/18/21 12/18/22 Yes Deveshwar, Abel Presto, MD  Upadacitinib ER (RINVOQ) 15 MG TB24 Take 1 tablet (15 mg) by mouth daily. 12/18/21  Yes Deveshwar, Abel Presto, MD  valACYclovir (VALTREX) 1000 MG tablet TAKE 2 TABLETS BY MOUTH AS A SINGLE DOSE AT FIRST SIGN OF COLD SORE THEN REPEAT IN 12 HOURS Patient not taking: Reported on 02/18/2022 04/10/21   [name], DO    Current Outpatient Medications  Medication Sig Dispense Refill   aspirin 81 MG chewable tablet Chew 1 tablet (81 mg total) by mouth 2 (two) times daily. (Patient taking differently: Chew 81 mg by mouth daily.) 30 tablet 0   Calcium Carb-Cholecalciferol (CALCIUM + D3) 600-200 MG-UNIT TABS Take 1 capsule by mouth daily.  Cholecalciferol (VITAMIN D) 50 MCG (2000 UT) CAPS Take 2,000 Units by mouth daily.     Esomeprazole Magnesium (NEXIUM PO) Take 20 mg by mouth 2 (two) times daily.     folic acid (FOLVITE) 1 MG tablet TAKE 2 TABLETS BY MOUTH EVERY DAY 180 tablet 3   Glucos-Chondroit-Hyaluron-MSM (GLUCOSAMINE CHONDROITIN JOINT PO) Take 1 tablet by mouth daily.     L-Lysine 1000 MG TABS Take 1,000 mg by mouth daily.      Methotrexate, PF, (OTREXUP) 25 MG/0.4ML SOAJ INJECT 25 MG INTO THE SKIN EVERY 7 DAYS. 1.6 mL 1   Upadacitinib ER (RINVOQ) 15 MG TB24 Take 1 tablet (15 mg) by mouth daily. 30 tablet 1   valACYclovir (VALTREX)  1000 MG tablet TAKE 2 TABLETS BY MOUTH AS A SINGLE DOSE AT FIRST SIGN OF COLD SORE THEN REPEAT IN 12 HOURS (Patient not taking: Reported on 02/18/2022) 90 tablet 0   Current Facility-Administered Medications  Medication Dose Route Frequency Provider Last Rate Last Admin   0.9 %  sodium chloride infusion  500 mL Intravenous Continuous Lakai Moree, Venia Minks, MD        Allergies as of 02/24/2022 - Review Complete 02/24/2022  Allergen Reaction Noted   Penicillins Hives     Family History  Problem Relation Age of Onset   Uterine cancer Mother    Diabetes Father    Kidney cancer Father    Colon cancer Sister 80   Rectal cancer Sister    Colon cancer Paternal Grandfather    Colon polyps Brother    Cancer Sister        breast    Healthy Daughter    Esophageal cancer Neg Hx    Stomach cancer Neg Hx     Social History   Socioeconomic History   Marital status: Married    Spouse name: Not on file   Number of children: Not on file   Years of education: Not on file   Highest education level: Not on file  Occupational History    Employer: [employer]    Comment: marketing  Tobacco Use   Smoking status: Never    Passive exposure: Never   Smokeless tobacco: Never  Vaping Use   Vaping Use: Never used  Substance and Sexual Activity   Alcohol use: Yes    Comment: rare alcohol use-once/twice a year   Drug use: Never   Sexual activity: Yes    Partners: Male  Other Topics Concern   Not on file  Social History Narrative   Exercise--- sometimes walks 1x a week   Social Determinants of Health   Financial Resource Strain: Not on file  Food Insecurity: No Food Insecurity (03/17/2018)   Hunger Vital Sign    Worried About Charity fundraiser in the Last Year: Never true    Ran Out of Food in the Last Year: Never true  Transportation Needs: Not on file  Physical Activity: Not on file  Stress: Not on file  Social Connections: Not on file  Intimate Partner Violence: Not on file    Review  of Systems:  All other review of systems negative except as mentioned in the HPI.  Physical Exam: Vital signs in last 24 hours: Blood Pressure 135/79   Pulse 86   Temperature (Abnormal) 97.5 F (36.4 C) (Temporal)   Height 5' 2.5" (1.588 m)   Weight 248 lb (112.5 kg)   Last Menstrual Period 08/18/2018   Oxygen Saturation 97%   Body Mass Index 44.64  kg/m  General:   Alert, NAD Lungs:  Clear .   Heart:  Regular rate and rhythm Abdomen:  Soft, nontender and nondistended. Neuro/Psych:  Alert and cooperative. Normal mood and affect. A and O x 3  Reviewed labs, radiology imaging, old records and pertinent past GI work up  Patient is appropriate for planned procedure(s) and anesthesia in an ambulatory setting   K. Denzil Magnuson , MD [phone]

## 2022-02-24 NOTE — Progress Notes (Signed)
Called to room to assist during endoscopic procedure.  Patient ID and intended procedure confirmed with present staff. Received instructions for my participation in the procedure from the performing physician.  

## 2022-02-24 NOTE — Patient Instructions (Signed)
Discharge instructions given. Handouts on Hiatal Hernia and Gastritis. Resume previous medications. YOU HAD AN ENDOSCOPIC PROCEDURE TODAY AT [facility]:   Refer to the procedure report that was given to you for any specific questions about what was found during the examination.  If the procedure report does not answer your questions, please call your gastroenterologist to clarify.  If you requested that your care partner not be given the details of your procedure findings, then the procedure report has been included in a sealed envelope for you to review at your convenience later.  YOU SHOULD EXPECT: Some feelings of bloating in the abdomen. Passage of more gas than usual.  Walking can help get rid of the air that was put into your GI tract during the procedure and reduce the bloating. If you had a lower endoscopy (such as a colonoscopy or flexible sigmoidoscopy) you may notice spotting of blood in your stool or on the toilet paper. If you underwent a bowel prep for your procedure, you may not have a normal bowel movement for a few days.  Please Note:  You might notice some irritation and congestion in your nose or some drainage.  This is from the oxygen used during your procedure.  There is no need for concern and it should clear up in a day or so.  SYMPTOMS TO REPORT IMMEDIATELY:   Following upper endoscopy (EGD)  Vomiting of blood or coffee ground material  New chest pain or pain under the shoulder blades  Painful or persistently difficult swallowing  New shortness of breath  Fever of 100F or higher  Black, tarry-looking stools  For urgent or emergent issues, a gastroenterologist can be reached at any hour by calling [phone]. Do not use MyChart messaging for urgent concerns.    DIET:  We do recommend a small meal at first, but then you may proceed to your regular diet.  Drink plenty of fluids but you should avoid alcoholic beverages for 24 hours.  ACTIVITY:  You  should plan to take it easy for the rest of today and you should NOT DRIVE or use heavy machinery until tomorrow (because of the sedation medicines used during the test).    FOLLOW UP: Our staff will call the number listed on your records the next business day following your procedure.  We will call around 7:15- 8:00 am to check on you and address any questions or concerns that you may have regarding the information given to you following your procedure. If we do not reach you, we will leave a message.     If any biopsies were taken you will be contacted by phone or by letter within the next 1-3 weeks.  Please call us at [phone] if you have not heard about the biopsies in 3 weeks.    SIGNATURES/CONFIDENTIALITY: You and/or your care partner have signed paperwork which will be entered into your electronic medical record.  These signatures attest to the fact that that the information above on your After Visit Summary has been reviewed and is understood.  Full responsibility of the confidentiality of this discharge information lies with you and/or your care-partner.

## 2022-02-25 ENCOUNTER — Other Ambulatory Visit: Payer: Self-pay

## 2022-02-25 ENCOUNTER — Telehealth: Payer: Self-pay

## 2022-02-25 ENCOUNTER — Other Ambulatory Visit (HOSPITAL_COMMUNITY): Payer: Self-pay

## 2022-02-25 DIAGNOSIS — K449 Diaphragmatic hernia without obstruction or gangrene: Secondary | ICD-10-CM

## 2022-02-25 NOTE — Telephone Encounter (Signed)
  Follow up Call-     02/24/2022    1:51 PM  Call back number  Post procedure Call Back phone  # [phone]  Permission to leave phone message Yes     Patient questions:  Do you have a fever, pain , or abdominal swelling? No. Pain Score  0 *  Have you tolerated food without any problems? Yes.    Have you been able to return to your normal activities? Yes.    Do you have any questions about your discharge instructions: Diet   No. Medications  No. Follow up visit  No.  Do you have questions or concerns about your Care? No.  Actions: * If pain score is 4 or above: No action needed, pain <4.

## 2022-02-27 ENCOUNTER — Other Ambulatory Visit (HOSPITAL_COMMUNITY): Payer: Self-pay

## 2022-02-28 ENCOUNTER — Telehealth: Payer: Self-pay | Admitting: Gastroenterology

## 2022-02-28 NOTE — Telephone Encounter (Signed)
Inbound call from patient stating her insurance told her to have referral sent into [facility] imaging because the recent ct provider is not covered by patient insurance. Please give a call back to further advise.  Thank you

## 2022-02-28 NOTE — Telephone Encounter (Signed)
Patient needs CT done at [facility]. Patient said "a man said I would need to get it there so it is not out of network." Not clear what man called her. The imaging is order at [facility]. The patient is given the phone number to call and schedule since she has not been contacted.

## 2022-03-03 ENCOUNTER — Other Ambulatory Visit (HOSPITAL_COMMUNITY): Payer: Self-pay

## 2022-03-05 ENCOUNTER — Other Ambulatory Visit: Payer: Self-pay | Admitting: *Deleted

## 2022-03-05 ENCOUNTER — Encounter: Payer: Self-pay | Admitting: Gastroenterology

## 2022-03-05 DIAGNOSIS — M85852 Other specified disorders of bone density and structure, left thigh: Secondary | ICD-10-CM | POA: Diagnosis not present

## 2022-03-05 DIAGNOSIS — Z780 Asymptomatic menopausal state: Secondary | ICD-10-CM | POA: Diagnosis not present

## 2022-03-05 DIAGNOSIS — Z79899 Other long term (current) drug therapy: Secondary | ICD-10-CM

## 2022-03-05 DIAGNOSIS — M85851 Other specified disorders of bone density and structure, right thigh: Secondary | ICD-10-CM | POA: Diagnosis not present

## 2022-03-06 LAB — COMPLETE METABOLIC PANEL WITH GFR
AG Ratio: 1.3 (calc) (ref 1.0–2.5)
ALT: 23 U/L (ref 6–29)
AST: 22 U/L (ref 10–35)
Albumin: 4 g/dL (ref 3.6–5.1)
Alkaline phosphatase (APISO): 70 U/L (ref 37–153)
BUN: 18 mg/dL (ref 7–25)
CO2: 28 mmol/L (ref 20–32)
Calcium: 8.9 mg/dL (ref 8.6–10.4)
Chloride: 104 mmol/L (ref 98–110)
Creat: 0.63 mg/dL (ref 0.50–1.03)
Globulin: 3.1 g/dL (calc) (ref 1.9–3.7)
Glucose, Bld: 101 mg/dL — ABNORMAL HIGH (ref 65–99)
Potassium: 4.3 mmol/L (ref 3.5–5.3)
Sodium: 140 mmol/L (ref 135–146)
Total Bilirubin: 0.3 mg/dL (ref 0.2–1.2)
Total Protein: 7.1 g/dL (ref 6.1–8.1)
eGFR: 103 mL/min/{1.73_m2} (ref >=60)

## 2022-03-06 LAB — CBC WITH DIFFERENTIAL/PLATELET
Absolute Monocytes: 600 cells/uL (ref 200–950)
Basophils Absolute: 40 cells/uL (ref 0–200)
Basophils Relative: 0.5 %
Eosinophils Absolute: 103 cells/uL (ref 15–500)
Eosinophils Relative: 1.3 %
HCT: 37.8 % (ref 35.0–45.0)
Hemoglobin: 12.8 g/dL (ref 11.7–15.5)
Lymphs Abs: 2362 cells/uL (ref 850–3900)
MCH: 29.9 pg (ref 27.0–33.0)
MCHC: 33.9 g/dL (ref 32.0–36.0)
MCV: 88.3 fL (ref 80.0–100.0)
MPV: 10.2 fL (ref 7.5–12.5)
Monocytes Relative: 7.6 %
Neutro Abs: 4795 cells/uL (ref 1500–7800)
Neutrophils Relative %: 60.7 %
Platelets: 318 10*3/uL (ref 140–400)
RBC: 4.28 10*6/uL (ref 3.80–5.10)
RDW: 15.8 % — ABNORMAL HIGH (ref 11.0–15.0)
Total Lymphocyte: 29.9 %
WBC: 7.9 10*3/uL (ref 3.8–10.8)

## 2022-03-06 NOTE — Progress Notes (Signed)
CBC and CMP normal

## 2022-03-10 ENCOUNTER — Other Ambulatory Visit: Payer: BC Managed Care – PPO

## 2022-03-10 ENCOUNTER — Telehealth: Payer: Self-pay | Admitting: *Deleted

## 2022-03-10 NOTE — Telephone Encounter (Signed)
Received DEXA results from [facility].  Date of Scan: 03/05/2022  Lowest T-score:-2.2  BMD:0.605  Lowest site measured:Left Femoral Neck  DX: Osteopenia  Significant changes in BMD and site measured (5% and above):7 % Right Total Femur, 15 % Left Total Femur, 10 % AP Sine  Current Regimen:Reclast, Last Infusion: 09/06/2021, Calcium, Vitamin D  Recommendation:Osteopenia, repeat Dexa in 2 years. Drug Holiday recommended. Calcium, Vitamin D and resistive exercises.   Reviewed by:Hazel Sams, PA-C  Next Appointment:  05/27/2022  Patient advised of results and recommendations. Patient expressed understanding.

## 2022-03-11 ENCOUNTER — Encounter: Payer: Self-pay | Admitting: Gastroenterology

## 2022-03-11 DIAGNOSIS — F4323 Adjustment disorder with mixed anxiety and depressed mood: Secondary | ICD-10-CM | POA: Diagnosis not present

## 2022-03-12 ENCOUNTER — Other Ambulatory Visit (HOSPITAL_COMMUNITY): Payer: Self-pay

## 2022-03-12 ENCOUNTER — Other Ambulatory Visit: Payer: Self-pay | Admitting: Rheumatology

## 2022-03-12 DIAGNOSIS — M0579 Rheumatoid arthritis with rheumatoid factor of multiple sites without organ or systems involvement: Secondary | ICD-10-CM

## 2022-03-12 MED ORDER — RINVOQ 15 MG PO TB24
15.0000 mg | ORAL_TABLET | Freq: Every day | ORAL | 2 refills | Status: DC
  Filled 2022-03-12: qty 30, 30d supply, fill #0
  Filled 2022-04-08: qty 30, 30d supply, fill #1
  Filled 2022-05-08 – 2022-05-09 (×3): qty 30, 30d supply, fill #2

## 2022-03-12 MED ORDER — OTREXUP 25 MG/0.4ML ~~LOC~~ SOAJ
SUBCUTANEOUS | 2 refills | Status: DC
  Filled 2022-03-12: qty 1.6, 28d supply, fill #0
  Filled 2022-04-08: qty 1.6, 28d supply, fill #1
  Filled 2022-05-08 (×2): qty 1.6, 28d supply, fill #2

## 2022-03-12 NOTE — Telephone Encounter (Signed)
Next Visit: 05/27/2022  Last Visit: 12/25/2021  Last Fill: 12/18/2021  CB:[id number] arthritis involving multiple sites with positive rheumatoid factor   Current Dose per office note 12/25/2021: Rinvoq 15 mg 1 tablet by mouth daily methotrexate 25 mg sq injections once weekly  Labs: 03/05/2022 CBC and CMP normal.  TB Gold: 11/08/2021 Neg    Okay to refill Rinvoq and Otrexup?

## 2022-03-17 ENCOUNTER — Ambulatory Visit
Admission: RE | Admit: 2022-03-17 | Discharge: 2022-03-17 | Disposition: A | Discharge status: Home / Self Care | Payer: BC Managed Care – PPO | Source: Ambulatory Visit | Attending: Gastroenterology | Admitting: Gastroenterology

## 2022-03-17 ENCOUNTER — Other Ambulatory Visit (HOSPITAL_COMMUNITY): Payer: Self-pay

## 2022-03-17 DIAGNOSIS — J9811 Atelectasis: Secondary | ICD-10-CM | POA: Diagnosis not present

## 2022-03-17 DIAGNOSIS — I700 Atherosclerosis of aorta: Secondary | ICD-10-CM | POA: Diagnosis not present

## 2022-03-17 DIAGNOSIS — J90 Pleural effusion, not elsewhere classified: Secondary | ICD-10-CM | POA: Diagnosis not present

## 2022-03-17 DIAGNOSIS — K449 Diaphragmatic hernia without obstruction or gangrene: Secondary | ICD-10-CM

## 2022-03-19 ENCOUNTER — Other Ambulatory Visit (HOSPITAL_COMMUNITY): Payer: Self-pay

## 2022-04-01 DIAGNOSIS — F4323 Adjustment disorder with mixed anxiety and depressed mood: Secondary | ICD-10-CM | POA: Diagnosis not present

## 2022-04-03 ENCOUNTER — Encounter: Payer: BC Managed Care – PPO | Admitting: Family Medicine

## 2022-04-08 ENCOUNTER — Other Ambulatory Visit (HOSPITAL_COMMUNITY): Payer: Self-pay

## 2022-04-14 ENCOUNTER — Other Ambulatory Visit (HOSPITAL_COMMUNITY): Payer: Self-pay

## 2022-04-16 ENCOUNTER — Encounter: Payer: Self-pay | Admitting: Gastroenterology

## 2022-04-17 DIAGNOSIS — M810 Age-related osteoporosis without current pathological fracture: Secondary | ICD-10-CM | POA: Diagnosis not present

## 2022-04-17 DIAGNOSIS — E785 Hyperlipidemia, unspecified: Secondary | ICD-10-CM | POA: Diagnosis not present

## 2022-04-21 ENCOUNTER — Other Ambulatory Visit: Payer: Self-pay

## 2022-04-21 DIAGNOSIS — K449 Diaphragmatic hernia without obstruction or gangrene: Secondary | ICD-10-CM

## 2022-04-23 ENCOUNTER — Encounter (HOSPITAL_COMMUNITY)
Admission: RE | Disposition: A | Discharge status: Home / Self Care | Payer: Self-pay | Source: Ambulatory Visit | Attending: Gastroenterology

## 2022-04-23 ENCOUNTER — Ambulatory Visit (HOSPITAL_COMMUNITY)
Admission: RE | Admit: 2022-04-23 | Discharge: 2022-04-23 | Disposition: A | Discharge status: Home / Self Care | Payer: BC Managed Care – PPO | Source: Ambulatory Visit | Attending: Gastroenterology | Admitting: Gastroenterology

## 2022-04-23 DIAGNOSIS — K449 Diaphragmatic hernia without obstruction or gangrene: Secondary | ICD-10-CM | POA: Insufficient documentation

## 2022-04-23 DIAGNOSIS — K219 Gastro-esophageal reflux disease without esophagitis: Secondary | ICD-10-CM | POA: Diagnosis not present

## 2022-04-23 DIAGNOSIS — R1310 Dysphagia, unspecified: Secondary | ICD-10-CM

## 2022-04-23 HISTORY — PX: ESOPHAGEAL MANOMETRY: SHX5429

## 2022-04-23 SURGERY — MANOMETRY, ESOPHAGUS
Anesthesia: Choice

## 2022-04-23 MED ORDER — LIDOCAINE VISCOUS HCL 2 % MT SOLN
OROMUCOSAL | Status: AC
  Filled 2022-04-23: qty 15

## 2022-04-23 SURGICAL SUPPLY — 2 items
FACESHIELD LNG OPTICON STERILE (SAFETY) IMPLANT
GLOVE BIO SURGEON STRL SZ8 (GLOVE) ×4 IMPLANT

## 2022-04-23 NOTE — Progress Notes (Signed)
Esophageal Manometry done per protocol. Patient tolerated well without distress or complication.  

## 2022-04-24 ENCOUNTER — Other Ambulatory Visit: Payer: Self-pay | Admitting: Internal Medicine

## 2022-04-24 DIAGNOSIS — Z1339 Encounter for screening examination for other mental health and behavioral disorders: Secondary | ICD-10-CM | POA: Diagnosis not present

## 2022-04-24 DIAGNOSIS — Z23 Encounter for immunization: Secondary | ICD-10-CM | POA: Diagnosis not present

## 2022-04-24 DIAGNOSIS — Z0000 Encounter for general adult medical examination without abnormal findings: Secondary | ICD-10-CM | POA: Diagnosis not present

## 2022-04-24 DIAGNOSIS — Z1331 Encounter for screening for depression: Secondary | ICD-10-CM | POA: Diagnosis not present

## 2022-04-24 DIAGNOSIS — E785 Hyperlipidemia, unspecified: Secondary | ICD-10-CM

## 2022-04-24 DIAGNOSIS — M810 Age-related osteoporosis without current pathological fracture: Secondary | ICD-10-CM | POA: Diagnosis not present

## 2022-04-25 ENCOUNTER — Encounter (HOSPITAL_COMMUNITY): Payer: Self-pay | Admitting: Gastroenterology

## 2022-05-06 DIAGNOSIS — F4323 Adjustment disorder with mixed anxiety and depressed mood: Secondary | ICD-10-CM | POA: Diagnosis not present

## 2022-05-08 ENCOUNTER — Other Ambulatory Visit: Payer: Self-pay

## 2022-05-08 ENCOUNTER — Other Ambulatory Visit (HOSPITAL_COMMUNITY): Payer: Self-pay

## 2022-05-09 ENCOUNTER — Other Ambulatory Visit: Payer: Self-pay

## 2022-05-09 ENCOUNTER — Other Ambulatory Visit (HOSPITAL_COMMUNITY): Payer: Self-pay

## 2022-05-14 ENCOUNTER — Ambulatory Visit
Admission: RE | Admit: 2022-05-14 | Discharge: 2022-05-14 | Disposition: A | Discharge status: Home / Self Care | Payer: No Typology Code available for payment source | Source: Ambulatory Visit | Attending: Internal Medicine | Admitting: Internal Medicine

## 2022-05-14 DIAGNOSIS — E785 Hyperlipidemia, unspecified: Secondary | ICD-10-CM

## 2022-05-14 DIAGNOSIS — E7800 Pure hypercholesterolemia, unspecified: Secondary | ICD-10-CM | POA: Diagnosis not present

## 2022-05-17 ENCOUNTER — Other Ambulatory Visit (HOSPITAL_COMMUNITY): Payer: Self-pay

## 2022-05-21 NOTE — Progress Notes (Unsigned)
Office Visit Note  Patient: Donna Lopez             Date of Birth: March 15, 1966           MRN: [id number]             PCP: Melida Quitter, MD Referring: Donato Schultz, * Visit Date: 05/27/2022 Occupation: @GUAROCC @  Subjective:  Medication monitoring   History of Present Illness: Donna Lopez is a 57 y.o. female with history of seropositive rheumatoid arthritis, osteoarthritis, DDD, and osteoporosis.  Patient is currently taking Rinvoq 15 mg 1 tablet by mouth daily (started in July 2022), methotrexate 25 mg sq injections once weekly, and folic acid 2 mg daily. She is tolerating combination therapy without any side effects.  She states she missed 1 dose of rinvoq 1 week ago but otherwise has not had any recent interruption.  Patient reports that her symptoms from rheumatoid arthritis have not been impeding on her daily life.  Her morning stiffness has only been lasting a few minutes daily.  She has not had any nocturnal pain or difficulty with ADLs. She denies any recent or recurrent infections.  She denies any new medical conditions.  She denies any recent falls or fractures.  She continues to take a calcium and vitamin D supplement as recommended.      Activities of Daily Living:  Patient reports morning stiffness for a few minutes.   Patient Denies nocturnal pain.  Difficulty dressing/grooming: Denies Difficulty climbing stairs: Reports Difficulty getting out of chair: Denies Difficulty using hands for taps, buttons, cutlery, and/or writing: Denies  Review of Systems  Constitutional:  Positive for fatigue.  HENT:  Negative for mouth sores and mouth dryness.   Eyes:  Negative for dryness.  Respiratory:  Positive for shortness of breath.   Cardiovascular:  Negative for chest pain and palpitations.  Gastrointestinal:  Negative for blood in stool, constipation and diarrhea.  Endocrine: Negative for increased urination.  Genitourinary:  Negative for involuntary  urination.  Musculoskeletal:  Positive for joint pain, joint pain and morning stiffness. Negative for gait problem, joint swelling, myalgias, muscle weakness, muscle tenderness and myalgias.  Skin:  Negative for color change, rash, hair loss and sensitivity to sunlight.  Allergic/Immunologic: Negative for susceptible to infections.  Neurological:  Negative for dizziness and headaches.  Hematological:  Negative for swollen glands.  Psychiatric/Behavioral:  Positive for sleep disturbance. Negative for depressed mood. The patient is not nervous/anxious.     PMFS History:  Patient Active Problem List   Diagnosis Date Noted   Age-related osteoporosis without current pathological fracture 08/15/2021   Pleural effusion on left 04/22/2021   Preventative health care 04/01/2021   Unilateral primary osteoarthritis, left knee 12/17/2018   Status post total left knee replacement 12/17/2018   Primary osteoarthritis of left knee 11/23/2018   Carpal tunnel syndrome, left upper limb 11/23/2018   BMI 40.0-44.9, adult (HCC) 07/12/2018   Primary osteoarthritis of both knees 12/24/2016   DDD (degenerative disc disease), thoracic 11/17/2016   DDD (degenerative disc disease), lumbar 11/17/2016   Vitamin D deficiency 11/17/2016   History of rosacea/ History of acne 11/17/2016   High risk medications (not anticoagulants) long-term use 06/25/2016   History of degenerative disc disease 06/25/2016   Severe obesity (BMI >= 40) (HCC) 06/13/2014   Left-sided thoracic back pain 07/19/2013   PALPITATIONS 10/17/2009   UTI 05/24/2009   Rheumatoid arthritis (HCC) 05/24/2009   Hair loss 02/22/2009   VAGINAL BLEEDING 10/05/2008  Carrier of group B Streptococcus 07/28/2007   GERD 12/28/2006   CARDIAC MURMUR, HX OF 12/28/2006   HIATAL HERNIA, HX OF 12/28/2006    Past Medical History:  Diagnosis Date   Allergy    Arthritis    Carpal tunnel syndrome 10/2018   DDD (degenerative disc disease), lumbar    DDD  (degenerative disc disease), thoracic    Family history of adverse reaction to anesthesia    Mothers sister never woke up from surgery   GERD (gastroesophageal reflux disease)    Grade I diastolic dysfunction 2013   Heart murmur    Heart palpitations    History of   History of hiatal hernia    OA (osteoarthritis)    Obese    Osteoporosis    Rheumatoid arthritis (HCC)    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    Vitamin D deficiency     Family History  Problem Relation Age of Onset   Uterine cancer Mother    Diabetes Father    Kidney cancer Father    Colon cancer Sister 20   Rectal cancer Sister    Colon cancer Paternal Grandfather    Colon polyps Brother    Cancer Sister        breast    Healthy Daughter    Esophageal cancer Neg Hx    Stomach cancer Neg Hx    Past Surgical History:  Procedure Laterality Date   COLONOSCOPY  2014   DB- normal    ESOPHAGEAL MANOMETRY N/A 04/23/2022   Procedure: ESOPHAGEAL MANOMETRY (EM);  Surgeon: Napoleon Form, MD;  Location: [facility];  Service: Gastroenterology;  Laterality: N/A;   KNEE ARTHROPLASTY     THORACENTESIS  04/2021   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   Procedure: LEFT TOTAL KNEE ARTHROPLASTY;  Surgeon: Kathryne Hitch, MD;  Location: [facility];  Service: Orthopedics;  Laterality: Left;   UPPER GASTROINTESTINAL ENDOSCOPY     Social History   Social History Narrative   Exercise--- sometimes walks 1x a week   Immunization History  Administered Date(s) Administered   Influenza Whole 02/08/2009   Influenza,inj,Quad PF,6+ Mos 03/09/2013, 06/13/2014, 06/16/2016, 02/14/2019, 03/30/2020, 04/01/2021   PFIZER(Purple Top)SARS-COV-2 Vaccination 08/26/2019, 09/27/2019, 05/30/2020   PNEUMOCOCCAL CONJUGATE-20 04/01/2021   Td 09/29/2001   Tdap 06/13/2014     Objective: Vital Signs: BP (!) 137/90 (BP Location: Right Arm, Patient Position: Sitting, Cuff Size: Normal)   Pulse 91   Resp 16   Ht 5\' 2"  (1.575 m)   Wt 244 lb (110.7  kg)   LMP 08/18/2018   BMI 44.63 kg/m    Physical Exam Vitals and nursing note reviewed.  Constitutional:      Appearance: She is well-developed.  HENT:     Head: Normocephalic and atraumatic.  Eyes:     Conjunctiva/sclera: Conjunctivae normal.  Cardiovascular:     Rate and Rhythm: Normal rate and regular rhythm.     Heart sounds: Normal heart sounds.  Pulmonary:     Effort: Pulmonary effort is normal.     Breath sounds: Normal breath sounds.  Abdominal:     General: Bowel sounds are normal.     Palpations: Abdomen is soft.  Musculoskeletal:     Cervical back: Normal range of motion.  Skin:    General: Skin is warm and dry.     Capillary Refill: Capillary refill takes less than 2 seconds.  Neurological:     Mental Status: She is alert and oriented to person, place, and time.  Psychiatric:  Behavior: Behavior normal.      Musculoskeletal Exam: C-spine has good range of motion with no discomfort.  Thoracic kyphosis noted.  No midline spinal tenderness or SI joint tenderness.  Shoulder joints and elbow joints have good range of motion with no discomfort.  Synovial thickening of both wrist joints but no tenderness or synovitis.  Synovial thickening of MCP joints but no active synovitis noted.  PIP and DIP thickening consistent with osteoarthritis of both hands.  Hip joints have good range of motion with no groin pain currently.  Left knee replacement has good range of motion with no warmth or effusion.  Right knee joint has good range of motion with no warmth or effusion.  Ankle joints have good range of motion with no tenderness or synovitis.  CDAI Exam: CDAI Score: -- Patient Global: 4 mm; Provider Global: 4 mm Swollen: --; Tender: -- Joint Exam 05/27/2022   No joint exam has been documented for this visit   There is currently no information documented on the homunculus. Go to the Rheumatology activity and complete the homunculus joint exam.  Investigation: No  additional findings.  Imaging: CT CARDIAC SCORING ([facility] LOCATIONS ONLY)  Addendum Date: 05/15/2022   ADDENDUM REPORT: 05/15/2022 18:19 ADDENDUM: The last impression should read Stable small left pleural effusion and hiatal hernia. Electronically Signed   By: Rolm Baptise M.D.   On: 05/15/2022 18:19   Result Date: 05/15/2022 CLINICAL DATA:  Elevated cholesterol * Tracking Code: FCC * EXAM: CT CARDIAC CORONARY ARTERY CALCIUM SCORE TECHNIQUE: Non-contrast imaging through the heart was performed using prospective ECG gating. Image post processing was performed on an independent workstation, allowing for quantitative analysis of the heart and coronary arteries. Note that this exam targets the heart and the chest was not imaged in its entirety. COMPARISON:  03/17/2022 FINDINGS: CORONARY CALCIUM SCORES: Left Main: 0 LAD: 140 LCx: 43 RCA: 0 Total Agatston Score: 183 MESA database percentile: 96 AORTA MEASUREMENTS: Ascending Aorta: 38 cm Descending Aorta:27 cm OTHER FINDINGS: Heart is normal size. Aorta normal caliber. Scattered calcifications in the aortic root. No adenopathy. Moderate to large hiatal hernia. Small left pleural effusion, unchanged since prior study. No confluent airspace opacities. No acute findings in the upper abdomen. Chest wall soft tissues are unremarkable. No acute bony abnormality. IMPRESSION: Total Agatston score: [place] percentile: 96 No acute or significant extracardiac abnormality. Electronically Signed: By: Rolm Baptise M.D. On: 05/15/2022 18:12    Recent Labs: Lab Results  Component Value Date   WBC 7.9 03/05/2022   HGB 12.8 03/05/2022   PLT 318 03/05/2022   NA 140 03/05/2022   K 4.3 03/05/2022   CL 104 03/05/2022   CO2 28 03/05/2022   GLUCOSE 101 (H) 03/05/2022   BUN 18 03/05/2022   CREATININE 0.63 03/05/2022   BILITOT 0.3 03/05/2022   ALKPHOS 69 11/08/2021   AST 22 03/05/2022   ALT 23 03/05/2022   PROT 7.1 03/05/2022   ALBUMIN 4.3 11/08/2021   CALCIUM  8.9 03/05/2022   GFRAA 114 10/12/2020   QFTBGOLDPLUS Negative 11/08/2021    Speciality Comments: Humira- quit working Simponi- inadequate response Orencia-since 07/2019 Reclast start 07/18/20, September 06, 2021  Procedures:  No procedures performed Allergies: Penicillins      Assessment / Plan:     Visit Diagnoses: Rheumatoid arthritis involving multiple sites with positive rheumatoid factor ([place]) - Positive rheumatoid factor, positive anti-CCP, erosive disease: She has no active synovitis on examination today.  She has not had any signs  or symptoms of a rheumatoid arthritis flare.  She has synovial thickening of both wrist joints and all MCP joints but no active inflammation was noted.  She remains on Rinvoq 15 mg 1 tablet by mouth daily and methotrexate 25 mg subcutaneous injections once weekly.  She is tolerating combination therapy without any side effects or injection site reactions from methotrexate.  Her morning stiffness has been lasting a few minutes daily but she has not had any nocturnal pain or difficulty with ADLs.  Overall her rheumatoid arthritis has not been impeding on her daily life.  She will remain on Rinvoq and methotrexate as combination therapy.  She will notify us if she develops signs or symptoms of a flare.  She will follow-up in the office in 5 months or sooner if needed.  High risk medications (not anticoagulants) long-term use - Rinvoq 15 mg 1 tablet by mouth daily (started in July 2022), methotrexate 25 mg sq injections once weekly CBC and CMP were drawn on 04/17/22.  Lab results will be obtained from her PCP to review.  Her next lab work will be updated in February and every 3 months. Standing orders for CBC and CMP remain in place.   TB Gold negative on 11/08/2021. No recent or recurrent infections. Discussed the importance of holding Rinvoq and methotrexate if she develops signs or symptoms of an infection and to resume once the infection has completely  cleared. She denies any new medical conditions.   Counseled on the increase risk of venous thrombosis. Counseled about FDA black box warning of MACE (major adverse CV events including cardiovascular death, myocardial infarction, and stroke).   Discussed the importance of modifying risk factors including close blood pressure monitoring, close blood glucose monitoring, and close lipid panel monitoring. Association of heart disease with rheumatoid arthritis was discussed. Need to monitor blood pressure, cholesterol, and to exercise 30-60 minutes on daily basis was discussed.  Pleural effusion, left - Chest x-ray on 04/16/21: findings: small left effusion and bibasilar atelectasis.  Underwent thoracentesis on 04/25/2021.  also evaluated at [facility] by Dr. Ronn Melena who agreed that the effusion was related to rheumatoid arthritis. He did not advise any further treatment.   Primary osteoarthritis of right knee: Good range of motion of the right knee joint noted on examination today.  No warmth or effusion noted.  S/P total knee arthroplasty, left: Doing well.  She experiences intermittent discomfort.  Good range of motion with no discomfort.  No warmth or effusion noted.  DDD (degenerative disc disease), thoracic: Thoracic kyphosis noted.  No midline spinal tenderness noted.  DDD (degenerative disc disease), lumbar: No midline spinal tenderness at this time.  No symptoms radiculopathy.  Acute right-sided low back pain without sciatica: Resolved.  No midline spinal tenderness.  No symptoms of sciatica at this time.  Age-related osteoporosis without current pathological fracture - DEXA updated on 03/05/2022: Left femoral neck BMD 0.605 with T-score -2.2.  Statistically significant increase in BMD of AP spine and both hips.  15% improvement in left total femur, 7% and right total femur, and 10% in AP spine.   Previous DEXA on 02/02/20: Left femur neck BMD 0.437 with T-score -3.7.  No recent falls or fractures. No  recent prednisone use.  Previous therapy: Reclast 07/18/20 and 09/06/21.  Results were discussed with the patient today in detail.  Patient was strongly encouraged to continue to take a calcium and vitamin D supplement daily.  I also discussed the importance of performing resistive exercises.  Her  last IV Reclast infusion was administered on 09/06/21.  Patient will be taking a drug holiday and will have an updated DEXA in October 2025.   Vitamin D deficiency: Vitamin D was 42 on 08/14/2021.  She has been taking vitamin D 2000 units daily.  Other medical conditions are listed as follow:   History of gastroesophageal reflux (GERD)  History of hiatal hernia  History of rosacea/ History of acne  Orders: No orders of the defined types were placed in this encounter.  No orders of the defined types were placed in this encounter.     Follow-Up Instructions: Return in about 5 months (around 10/26/2022) for Rheumatoid arthritis, Osteoarthritis, Osteoporosis.   Gearldine Bienenstock, PA-C  Note - This record has been created using Dragon software.  Chart creation errors have been sought, but may not always  have been located. Such creation errors do not reflect on  the standard of medical care.

## 2022-05-27 ENCOUNTER — Ambulatory Visit: Payer: BC Managed Care – PPO | Attending: Physician Assistant | Admitting: Physician Assistant

## 2022-05-27 ENCOUNTER — Encounter: Payer: Self-pay | Admitting: Physician Assistant

## 2022-05-27 VITALS — BP 137/90 | HR 91 | Resp 16 | Ht 62.0 in | Wt 244.0 lb

## 2022-05-27 DIAGNOSIS — Z79899 Other long term (current) drug therapy: Secondary | ICD-10-CM

## 2022-05-27 DIAGNOSIS — E559 Vitamin D deficiency, unspecified: Secondary | ICD-10-CM

## 2022-05-27 DIAGNOSIS — M5134 Other intervertebral disc degeneration, thoracic region: Secondary | ICD-10-CM

## 2022-05-27 DIAGNOSIS — M0579 Rheumatoid arthritis with rheumatoid factor of multiple sites without organ or systems involvement: Secondary | ICD-10-CM | POA: Diagnosis not present

## 2022-05-27 DIAGNOSIS — J90 Pleural effusion, not elsewhere classified: Secondary | ICD-10-CM

## 2022-05-27 DIAGNOSIS — M545 Low back pain, unspecified: Secondary | ICD-10-CM

## 2022-05-27 DIAGNOSIS — M1711 Unilateral primary osteoarthritis, right knee: Secondary | ICD-10-CM | POA: Diagnosis not present

## 2022-05-27 DIAGNOSIS — Z96652 Presence of left artificial knee joint: Secondary | ICD-10-CM | POA: Diagnosis not present

## 2022-05-27 DIAGNOSIS — M810 Age-related osteoporosis without current pathological fracture: Secondary | ICD-10-CM

## 2022-05-27 DIAGNOSIS — M5136 Other intervertebral disc degeneration, lumbar region: Secondary | ICD-10-CM

## 2022-05-27 DIAGNOSIS — Z872 Personal history of diseases of the skin and subcutaneous tissue: Secondary | ICD-10-CM

## 2022-05-27 DIAGNOSIS — Z8719 Personal history of other diseases of the digestive system: Secondary | ICD-10-CM

## 2022-05-27 NOTE — Patient Instructions (Signed)
Standing Labs We placed an order today for your standing lab work.   Please have your standing labs drawn at end of February and every 3 months   Please have your labs drawn 2 weeks prior to your appointment so that the provider can discuss your lab results at your appointment.  Please note that you may see your imaging and lab results in MyChart before we have reviewed them. We will contact you once all results are reviewed. Please allow our office up to 72 hours to thoroughly review all of the results before contacting the office for clarification of your results.  Lab hours are:   Monday through Thursday from 8:00 am -12:30 pm and 1:00 pm-5:00 pm and Friday from 8:00 am-12:00 pm.  Please be advised, all patients with office appointments requiring lab work will take precedent over walk-in lab work.   Labs are drawn by [facility]. Please bring your co-pay at the time of your lab draw.  You may receive a bill from [facility] for your lab work.  Please note if you are on Hydroxychloroquine and and an order has been placed for a Hydroxychloroquine level, you will need to have it drawn 4 hours or more after your last dose.  If you wish to have your labs drawn at another location, please call the office 24 hours in advance so we can fax the orders.  The office is located at [address], [address], [place], [place] [address] No appointment is necessary.    If you have any questions regarding directions or hours of operation,  please call [phone].   As a reminder, please drink plenty of water prior to coming for your lab work. Thanks!  

## 2022-05-30 ENCOUNTER — Telehealth: Payer: Self-pay | Admitting: *Deleted

## 2022-05-30 ENCOUNTER — Encounter: Payer: Self-pay | Admitting: *Deleted

## 2022-05-30 NOTE — Telephone Encounter (Signed)
Labs received from:[facility]  Drawn on:04/17/2022  Reviewed by:Hazel Sams, PA-C  Labs drawn:CBC, CMP, Lipid Panel, TSH, Vitamin D  Results:RBC 4.1  MPV 6.8  Cholesterol 202  Patient is on Otrexup 25 mg SQ once weekly and Rinvoq 15 mg po daily.

## 2022-06-03 DIAGNOSIS — F4323 Adjustment disorder with mixed anxiety and depressed mood: Secondary | ICD-10-CM | POA: Diagnosis not present

## 2022-06-04 ENCOUNTER — Other Ambulatory Visit (HOSPITAL_COMMUNITY): Payer: Self-pay

## 2022-06-04 ENCOUNTER — Other Ambulatory Visit: Payer: Self-pay | Admitting: Rheumatology

## 2022-06-04 DIAGNOSIS — M0579 Rheumatoid arthritis with rheumatoid factor of multiple sites without organ or systems involvement: Secondary | ICD-10-CM

## 2022-06-04 MED ORDER — OTREXUP 25 MG/0.4ML ~~LOC~~ SOAJ
SUBCUTANEOUS | 2 refills | Status: DC
  Filled 2022-06-04: qty 1.6, 28d supply, fill #0
  Filled 2022-07-07 – 2022-07-08 (×3): qty 1.6, 28d supply, fill #1
  Filled 2022-08-04: qty 1.6, 28d supply, fill #2

## 2022-06-04 MED ORDER — RINVOQ 15 MG PO TB24
15.0000 mg | ORAL_TABLET | Freq: Every day | ORAL | 2 refills | Status: DC
  Filled 2022-06-04: qty 30, 30d supply, fill #0
  Filled 2022-07-07 – 2022-07-08 (×2): qty 30, 30d supply, fill #1
  Filled 2022-08-04 (×2): qty 30, 30d supply, fill #2

## 2022-06-04 NOTE — Telephone Encounter (Signed)
Next Visit: 10/29/2022  Last Visit: 05/27/2022  Last Fill: 03/12/2022  DX: Rheumatoid arthritis involving multiple sites with positive rheumatoid factor   Current Dose per office note 05/27/2022: Rinvoq 15 mg 1 tablet by mouth daily methotrexate 25 mg sq injections once weekly   Labs: 04/17/2022 RBC 4.1  MPV 6.8  Cholesterol 202  TB Gold: 11/08/2021 Neg    Okay to refill Rasuvo and Rinvoq?

## 2022-06-12 ENCOUNTER — Other Ambulatory Visit (HOSPITAL_COMMUNITY): Payer: Self-pay

## 2022-06-16 ENCOUNTER — Other Ambulatory Visit (HOSPITAL_COMMUNITY): Payer: Self-pay

## 2022-06-17 ENCOUNTER — Encounter: Payer: Self-pay | Admitting: Gastroenterology

## 2022-06-17 ENCOUNTER — Telehealth: Payer: Self-pay

## 2022-06-17 NOTE — Telephone Encounter (Signed)
The patient has completed her EGD and the esophageal manometry. She also has had her chest CT. Does she need a follow up appointment or is she being referred to a surgeon?

## 2022-06-18 ENCOUNTER — Other Ambulatory Visit: Payer: Self-pay

## 2022-06-18 DIAGNOSIS — K219 Gastro-esophageal reflux disease without esophagitis: Secondary | ICD-10-CM

## 2022-06-18 DIAGNOSIS — K449 Diaphragmatic hernia without obstruction or gangrene: Secondary | ICD-10-CM

## 2022-06-18 NOTE — Telephone Encounter (Signed)
Left information for the patient on her voicemail. Referral placed through [facility] because Dr Kipp Brood is a part of the [facility] system. Confirmed the referral was received. Message to the patient through My Chart because that was her original way of communicating.

## 2022-06-18 NOTE — Telephone Encounter (Signed)
Please send referral to Dr. Kipp Brood for hiatal hernia repair. She does have esophageal dysmotility, will need to lose wrap to prevent post op dysphagia Schedule follow-up visit with me next available appointment.  Thank you

## 2022-06-25 ENCOUNTER — Encounter (HOSPITAL_COMMUNITY): Payer: Self-pay | Admitting: Gastroenterology

## 2022-06-25 DIAGNOSIS — R1310 Dysphagia, unspecified: Secondary | ICD-10-CM

## 2022-06-26 ENCOUNTER — Encounter: Payer: Self-pay | Admitting: Gastroenterology

## 2022-06-26 ENCOUNTER — Ambulatory Visit: Payer: BC Managed Care – PPO | Admitting: Gastroenterology

## 2022-06-26 VITALS — BP 130/76 | HR 86 | Ht 62.0 in | Wt 245.0 lb

## 2022-06-26 DIAGNOSIS — K2970 Gastritis, unspecified, without bleeding: Secondary | ICD-10-CM

## 2022-06-26 DIAGNOSIS — D509 Iron deficiency anemia, unspecified: Secondary | ICD-10-CM | POA: Diagnosis not present

## 2022-06-26 DIAGNOSIS — K449 Diaphragmatic hernia without obstruction or gangrene: Secondary | ICD-10-CM | POA: Diagnosis not present

## 2022-06-26 DIAGNOSIS — R109 Unspecified abdominal pain: Secondary | ICD-10-CM

## 2022-06-26 DIAGNOSIS — K219 Gastro-esophageal reflux disease without esophagitis: Secondary | ICD-10-CM

## 2022-06-26 MED ORDER — ESOMEPRAZOLE MAGNESIUM 20 MG PO CPDR: 20.0000 mg | DELAYED_RELEASE_CAPSULE | Freq: Two times a day (BID) | ORAL | 1 refills | Status: DC

## 2022-06-26 NOTE — Patient Instructions (Signed)
We have sent the following medications to your pharmacy for you to pick up at your convenience: Nexium   _______________________________________________________  If your blood pressure at your visit was 140/90 or greater, please contact your primary care physician to follow up on this.  _______________________________________________________  If you are age 58 or older, your body mass index should be between 23-30. Your Body mass index is 44.81 kg/m. If this is out of the aforementioned range listed, please consider follow up with your Primary Care Provider.  If you are age 55 or younger, your body mass index should be between 19-25. Your Body mass index is 44.81 kg/m. If this is out of the aformentioned range listed, please consider follow up with your Primary Care Provider.   ________________________________________________________  The [facility] providers would like to encourage you to use [facility] to communicate with providers for non-urgent requests or questions.  Due to long hold times on the telephone, sending your provider a message by [facility] may be a faster and more efficient way to get a response.  Please allow 48 business hours for a response.  Please remember that this is for non-urgent requests.  _______________________________________________________  Thank you for choosing me and [facility].  Dr. Harl Bowie

## 2022-06-26 NOTE — Progress Notes (Signed)
Donna Lopez    UB:[id number]    [birth year]  Primary Care Physician:Wile, Jesse Sans, MD  Referring Physician: Michael Boston, MD [address] [place],  [place] [address]   Chief complaint: GERD, hiatal hernia  HPI:  57 year old very pleasant female here for follow-up visit with complaints of dysphagia and worsening GERD. She has large hiatal hernia. She is having difficulty swallowing pills and certain foods.  She has significant sore throat and burning sensation especially when she eats spicy food. Denies any food impactions.  No vomiting, melena or rectal bleeding.  Esophageal manometry December 2023 Normal relaxation of the EG junction, hiatal hernia and ineffective esophageal motility  CT chest March 17, 2022 1. Large sliding hiatal hernia with the gastric fundus within the thoracic compartment. 2. Moderate coronary artery calcification. 3. Morphologic changes in keeping with pulmonary arterial hypertension. 4. Small left pleural effusion. Nodular mildly calcified pleural plaque along the diaphragmatic surface may relate to remote trauma or inflammation. This is not well characterized on this examination. Pleural effusion appears similar, however, since prior chest radiograph of 04/16/2021. 5. Mosaic attenuation of the pulmonary parenchyma suggests multifocal air trapping, likely related to small airways disease.  EGD February 24, 2022 Impression:        - Z-line regular, 33 cm from the incisors.                           - Gastroesophageal flap valve classified as Hill                            Grade III (minimal fold, loose to endoscope, hiatal                            hernia likely).                           - 7 cm hiatal hernia.                           - Gastritis. Biopsied.                           - Normal examined duodenum.   Family history positive for colon cancer   Colonoscopy 12/02/2017: Left side diverticulosis and internal hemorrhoids    EGD 02/14/2004 by Dr Sharlett Iles showed hiatal hernia otherwise normal exam     Outpatient Encounter Medications as of 06/26/2022  Medication Sig   aspirin 81 MG chewable tablet Chew 1 tablet (81 mg total) by mouth 2 (two) times daily. (Patient taking differently: Chew 81 mg by mouth daily.)   Calcium Carb-Cholecalciferol (CALCIUM + D3) 600-200 MG-UNIT TABS Take 1 capsule by mouth daily.   Cholecalciferol (VITAMIN D) 50 MCG (2000 UT) CAPS Take 2,000 Units by mouth daily.   Esomeprazole Magnesium (NEXIUM PO) Take 20 mg by mouth 2 (two) times daily.   folic acid (FOLVITE) 1 MG tablet TAKE 2 TABLETS BY MOUTH EVERY DAY   Glucos-Chondroit-Hyaluron-MSM (GLUCOSAMINE CHONDROITIN JOINT PO) Take 1 tablet by mouth daily.   L-Lysine 1000 MG TABS Take 1,000 mg by mouth daily.    Methotrexate, PF, (OTREXUP) 25 MG/0.4ML SOAJ INJECT 25 MG INTO THE SKIN EVERY 7 DAYS.  rosuvastatin (CRESTOR) 10 MG tablet Take 10 mg by mouth daily.   Upadacitinib ER (RINVOQ) 15 MG TB24 Take 1 tablet (15 mg) by mouth daily.   valACYclovir (VALTREX) 1000 MG tablet TAKE 2 TABLETS BY MOUTH AS A SINGLE DOSE AT FIRST SIGN OF COLD SORE THEN REPEAT IN 12 HOURS   No facility-administered encounter medications on file as of 06/26/2022.    Allergies as of 06/26/2022 - Review Complete 06/26/2022  Allergen Reaction Noted   Penicillins Hives     Past Medical History:  Diagnosis Date   Allergy    Arthritis    Carpal tunnel syndrome 10/2018   DDD (degenerative disc disease), lumbar    DDD (degenerative disc disease), thoracic    Family history of adverse reaction to anesthesia    Mothers sister never woke up from surgery   GERD (gastroesophageal reflux disease)    Grade I diastolic dysfunction 0000000   Heart murmur    Heart palpitations    History of   History of hiatal hernia    OA (osteoarthritis)    Obese    Osteoporosis    Rheumatoid arthritis ([place])    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    Vitamin D deficiency     Past Surgical  History:  Procedure Laterality Date   COLONOSCOPY  2014   DB- normal    ESOPHAGEAL MANOMETRY N/A 04/23/2022   Procedure: ESOPHAGEAL MANOMETRY (EM);  Surgeon: Mauri Pole, MD;  Location: [facility];  Service: Gastroenterology;  Laterality: N/A;   KNEE ARTHROPLASTY     THORACENTESIS  04/2021   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   Procedure: LEFT TOTAL KNEE ARTHROPLASTY;  Surgeon: Mcarthur Rossetti, MD;  Location: [facility];  Service: Orthopedics;  Laterality: Left;   UPPER GASTROINTESTINAL ENDOSCOPY      Family History  Problem Relation Age of Onset   Uterine cancer Mother    Diabetes Father    Kidney cancer Father    Colon cancer Sister 35   Rectal cancer Sister    Colon cancer Paternal Grandfather    Colon polyps Brother    Cancer Sister        breast    Healthy Daughter    Esophageal cancer Neg Hx    Stomach cancer Neg Hx     Social History   Socioeconomic History   Marital status: Married    Spouse name: Not on file   Number of children: Not on file   Years of education: Not on file   Highest education level: Not on file  Occupational History    Employer: [employer]    Comment: marketing  Tobacco Use   Smoking status: Never    Passive exposure: Never   Smokeless tobacco: Never  Vaping Use   Vaping Use: Never used  Substance and Sexual Activity   Alcohol use: Yes    Comment: rare alcohol use-once/twice a year   Drug use: Never   Sexual activity: Yes    Partners: Male  Other Topics Concern   Not on file  Social History Narrative   Exercise--- sometimes walks 1x a week   Social Determinants of Health   Financial Resource Strain: Not on file  Food Insecurity: No Food Insecurity (03/17/2018)   Hunger Vital Sign    Worried About Running Out of Food in the Last Year: Never true    Ran Out of Food in the Last Year: Never true  Transportation Needs: Not on file  Physical Activity: Not on file  Stress:  Not on file  Social Connections: Not on file   Intimate Partner Violence: Not on file      Review of systems: All other review of systems negative except as mentioned in the HPI.   Physical Exam: Vitals:   06/26/22 1146  BP: 130/76  Pulse: 86  SpO2: 99%   Body mass index is 44.81 kg/m. Gen:      No acute distress HEENT:  sclera anicteric Abd:      soft, non-tender; no palpable masses, no distension Ext:    No edema Neuro: alert and oriented x 3 Psych: normal mood and affect  Data Reviewed:  Reviewed labs, radiology imaging, old records and pertinent past GI work up   Assessment and Plan/Recommendations:  57 year old very pleasant female with obesity, large hiatal hernia with worsening GERD symptoms and dysphagia  She will benefit from hernia repair, does have ineffective esophageal motility likely secondary to longstanding uncontrolled acid reflux, may need loose wrap to prevent postop dysphagia Referral sent to Dr. Kipp Brood for hernia repair  Continue antireflux measures and Nexium 20 mg twice daily for GERD  Return in 3 months  The patient was provided an opportunity to ask questions and all were answered. The patient agreed with the plan and demonstrated an understanding of the instructions.  Damaris Hippo , MD    CC: Michael Boston, MD

## 2022-07-01 DIAGNOSIS — F4323 Adjustment disorder with mixed anxiety and depressed mood: Secondary | ICD-10-CM | POA: Diagnosis not present

## 2022-07-03 ENCOUNTER — Other Ambulatory Visit (HOSPITAL_COMMUNITY): Payer: Self-pay

## 2022-07-07 ENCOUNTER — Other Ambulatory Visit (HOSPITAL_COMMUNITY): Payer: Self-pay

## 2022-07-08 ENCOUNTER — Other Ambulatory Visit (HOSPITAL_COMMUNITY): Payer: Self-pay

## 2022-07-11 ENCOUNTER — Institutional Professional Consult (permissible substitution): Payer: BC Managed Care – PPO | Admitting: Thoracic Surgery (Cardiothoracic Vascular Surgery)

## 2022-07-11 VITALS — BP 132/82 | HR 92 | Resp 18 | Ht 62.0 in | Wt 240.0 lb

## 2022-07-11 DIAGNOSIS — K449 Diaphragmatic hernia without obstruction or gangrene: Secondary | ICD-10-CM

## 2022-07-11 NOTE — Progress Notes (Signed)
[address]       [place] [address]             [phone]                    Wilma S Dowty [place] Medical Record [id number] Date of Birth: 08/13/1965  Referring: Mauri Pole, MD Primary Care: Michael Boston, MD Primary Cardiologist: None  Chief Complaint:    Chief Complaint  Patient presents with   Hiatal Hernia    Chest CT 10/30, Endo 10/9, manometry 12/6    History of Present Illness:    Donna Lopez 57 y.o. female presents for surgical evaluation of a large hiatal hernia.  She states that she is known about this for over 20 years but recently she has had worsening reflux and dysphagia.  On occasion she also has some odynophagia.  She also states that after having the size meals she has worsening shortness of breath.  Her weight has been stable.  She has had an intentional 10 pound weight loss.     Past Medical History:  Diagnosis Date   Allergy    Arthritis    Carpal tunnel syndrome 10/2018   DDD (degenerative disc disease), lumbar    DDD (degenerative disc disease), thoracic    Family history of adverse reaction to anesthesia    Mothers sister never woke up from surgery   GERD (gastroesophageal reflux disease)    Grade I diastolic dysfunction 0000000   Heart murmur    Heart palpitations    History of   History of hiatal hernia    OA (osteoarthritis)    Obese    Osteoporosis    Rheumatoid arthritis ([place])    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    Vitamin D deficiency     Past Surgical History:  Procedure Laterality Date   COLONOSCOPY  2014   DB- normal    ESOPHAGEAL MANOMETRY N/A 04/23/2022   Procedure: ESOPHAGEAL MANOMETRY (EM);  Surgeon: Mauri Pole, MD;  [facility];  Service: Gastroenterology;  Laterality: N/A;   KNEE ARTHROPLASTY     THORACENTESIS  04/2021   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   Procedure: LEFT TOTAL KNEE ARTHROPLASTY;  Surgeon: Mcarthur Rossetti, MD;  Location: [facility];  Service:  Orthopedics;  Laterality: Left;   UPPER GASTROINTESTINAL ENDOSCOPY      Family History  Problem Relation Age of Onset   Uterine cancer Mother    Diabetes Father    Kidney cancer Father    Colon cancer Sister 21   Rectal cancer Sister    Colon cancer Paternal Grandfather    Colon polyps Brother    Cancer Sister        breast    Healthy Daughter    Esophageal cancer Neg Hx    Stomach cancer Neg Hx      Social History   Tobacco Use  Smoking Status Never   Passive exposure: Never  Smokeless Tobacco Never    Social History   Substance and Sexual Activity  Alcohol Use Yes   Comment: rare alcohol use-once/twice a year     Allergies  Allergen Reactions   Penicillins Hives    Did it involve swelling of the face/tongue/throat, SOB, or low BP? No Did it involve sudden or severe rash/hives, skin peeling, or any reaction on the inside of your mouth or nose? No Did you need to seek medical attention at a hospital or  doctor's office? Yes When did it last happen?      15 -20 years ago If all above answers are "NO", may proceed with cephalosporin use.     Current Outpatient Medications  Medication Sig Dispense Refill   aspirin 81 MG chewable tablet Chew 1 tablet (81 mg total) by mouth 2 (two) times daily. (Patient taking differently: Chew 81 mg by mouth daily.) 30 tablet 0   Calcium Carb-Cholecalciferol (CALCIUM + D3) 600-200 MG-UNIT TABS Take 1 capsule by mouth daily.     Cholecalciferol (VITAMIN D) 50 MCG (2000 UT) CAPS Take 2,000 Units by mouth daily.     esomeprazole (NEXIUM) 20 MG capsule Take 1 capsule (20 mg total) by mouth 2 (two) times daily before a meal. 60 capsule 1   folic acid (FOLVITE) 1 MG tablet TAKE 2 TABLETS BY MOUTH EVERY DAY 180 tablet 3   Glucos-Chondroit-Hyaluron-MSM (GLUCOSAMINE CHONDROITIN JOINT PO) Take 1 tablet by mouth daily.     L-Lysine 1000 MG TABS Take 1,000 mg by mouth daily.      Methotrexate, PF, (OTREXUP) 25 MG/0.4ML SOAJ INJECT 25 MG INTO  THE SKIN EVERY 7 DAYS. 1.6 mL 2   rosuvastatin (CRESTOR) 10 MG tablet Take 10 mg by mouth daily.     Upadacitinib ER (RINVOQ) 15 MG TB24 Take 1 tablet (15 mg) by mouth daily. 30 tablet 2   valACYclovir (VALTREX) 1000 MG tablet TAKE 2 TABLETS BY MOUTH AS A SINGLE DOSE AT FIRST SIGN OF COLD SORE THEN REPEAT IN 12 HOURS 90 tablet 0   No current facility-administered medications for this visit.    Review of Systems  Constitutional:  Positive for weight loss. Negative for malaise/fatigue.  Respiratory:  Positive for shortness of breath.   Cardiovascular:  Positive for chest pain.  Gastrointestinal:  Positive for abdominal pain, heartburn and nausea.  Neurological: Negative.      PHYSICAL EXAMINATION: BP 132/82 (BP Location: Left Arm, Patient Position: Sitting)   Pulse 92   Resp 18   Ht '5\' 2"'$  (1.575 m)   Wt 240 lb (108.9 kg)   LMP 08/18/2018   SpO2 95% Comment: RA  BMI 43.90 kg/m  Physical Exam Constitutional:      General: She is not in acute distress.    Appearance: Normal appearance. She is not ill-appearing.  HENT:     Head: Normocephalic and atraumatic.  Eyes:     Extraocular Movements: Extraocular movements intact.  Cardiovascular:     Rate and Rhythm: Normal rate.  Pulmonary:     Effort: No respiratory distress.  Abdominal:     General: There is no distension.  Musculoskeletal:        General: Normal range of motion.     Cervical back: Normal range of motion.  Skin:    General: Skin is warm and dry.  Neurological:     General: No focal deficit present.     Mental Status: She is alert and oriented to person, place, and time.     Diagnostic Studies & Laboratory data:    CT Scan:   EGD: 7cm hiatal hernia Manometry: esophageal dysmotility Path: negative for malignancy      I have independently reviewed the above radiology studies  and reviewed the findings with the patient.   Recent Lab Findings: Lab Results  Component Value Date   WBC 7.9 03/05/2022    HGB 12.8 03/05/2022   HCT 37.8 03/05/2022   PLT 318 03/05/2022   GLUCOSE 101 (H) 03/05/2022  CHOL 220 (H) 04/01/2021   TRIG 118.0 04/01/2021   HDL 52.10 04/01/2021   LDLDIRECT 123.0 03/17/2019   LDLCALC 144 (H) 04/01/2021   ALT 23 03/05/2022   AST 22 03/05/2022   NA 140 03/05/2022   K 4.3 03/05/2022   CL 104 03/05/2022   CREATININE 0.63 03/05/2022   BUN 18 03/05/2022   CO2 28 03/05/2022   TSH 2.28 04/01/2021   HGBA1C 5.3 02/22/2009      Assessment / Plan:   56 year old female with moderate size hiatal hernia and significant symptoms.  She is also obese with a BMI of 43.9.  We discussed the risks and benefits of an EGD, robotic assisted laparoscopy, paraesophageal hernia repair with loose fundoplication given her history of esophageal dysmotility.  I explained to her that her recurrence rate is higher given her current weight, and I recommended that she attempt to lose some weight prior to surgery as this will reduce the pressure with the repair.  Additionally she has a history of rheumatoid arthritis, and is on 2 immunosuppressants that will need to be stopped 2 weeks prior to surgery.  She would like to discuss things further with family, but she is leaning towards surgery sometime in March.  She will call us back with her decision.     I  spent 40 minutes with  the patient face to face and greater then 50% of the time was spent in counseling and coordination of care.    Lajuana Matte 07/11/2022 4:05 PM

## 2022-07-14 ENCOUNTER — Other Ambulatory Visit (HOSPITAL_COMMUNITY): Payer: Self-pay

## 2022-07-16 ENCOUNTER — Ambulatory Visit: Payer: BC Managed Care – PPO | Admitting: Gastroenterology

## 2022-07-18 ENCOUNTER — Encounter: Payer: Self-pay | Admitting: Gastroenterology

## 2022-07-27 ENCOUNTER — Other Ambulatory Visit: Payer: Self-pay | Admitting: Physician Assistant

## 2022-07-28 NOTE — Telephone Encounter (Signed)
Next Visit: 10/29/2022  Last Visit: 05/27/2022  Last Fill: 07/05/2021  Dx:   Rheumatoid arthritis involving multiple sites with positive rheumatoid factor    Current Dose per office note on [id number]: folic acid 2 mg daily.   Okay to refill Folic acid?

## 2022-07-29 DIAGNOSIS — F4323 Adjustment disorder with mixed anxiety and depressed mood: Secondary | ICD-10-CM | POA: Diagnosis not present

## 2022-07-31 ENCOUNTER — Other Ambulatory Visit (HOSPITAL_COMMUNITY): Payer: Self-pay

## 2022-08-04 ENCOUNTER — Other Ambulatory Visit (HOSPITAL_COMMUNITY): Payer: Self-pay

## 2022-08-11 ENCOUNTER — Other Ambulatory Visit (HOSPITAL_COMMUNITY): Payer: Self-pay

## 2022-08-12 ENCOUNTER — Other Ambulatory Visit: Payer: Self-pay

## 2022-08-26 DIAGNOSIS — F4323 Adjustment disorder with mixed anxiety and depressed mood: Secondary | ICD-10-CM | POA: Diagnosis not present

## 2022-09-08 ENCOUNTER — Encounter: Payer: Self-pay | Admitting: *Deleted

## 2022-09-08 ENCOUNTER — Other Ambulatory Visit: Payer: Self-pay | Admitting: Physician Assistant

## 2022-09-08 ENCOUNTER — Other Ambulatory Visit (HOSPITAL_COMMUNITY): Payer: Self-pay

## 2022-09-08 DIAGNOSIS — M0579 Rheumatoid arthritis with rheumatoid factor of multiple sites without organ or systems involvement: Secondary | ICD-10-CM

## 2022-09-10 ENCOUNTER — Other Ambulatory Visit: Payer: Self-pay | Admitting: *Deleted

## 2022-09-10 DIAGNOSIS — Z79899 Other long term (current) drug therapy: Secondary | ICD-10-CM

## 2022-09-10 LAB — CBC WITH DIFFERENTIAL/PLATELET
Basophils Absolute: 26 cells/uL (ref 0–200)
Eosinophils Relative: 1.3 %
Neutrophils Relative %: 63.6 %
Platelets: 321 10*3/uL (ref 140–400)
WBC: 8.8 10*3/uL (ref 3.8–10.8)

## 2022-09-11 ENCOUNTER — Other Ambulatory Visit (HOSPITAL_COMMUNITY): Payer: Self-pay

## 2022-09-11 ENCOUNTER — Other Ambulatory Visit: Payer: Self-pay

## 2022-09-11 LAB — COMPLETE METABOLIC PANEL WITH GFR
AG Ratio: 1.2 (calc) (ref 1.0–2.5)
ALT: 18 U/L (ref 6–29)
AST: 17 U/L (ref 10–35)
Albumin: 3.9 g/dL (ref 3.6–5.1)
Alkaline phosphatase (APISO): 68 U/L (ref 37–153)
BUN: 17 mg/dL (ref 7–25)
CO2: 27 mmol/L (ref 20–32)
Calcium: 9.1 mg/dL (ref 8.6–10.4)
Chloride: 103 mmol/L (ref 98–110)
Creat: 0.57 mg/dL (ref 0.50–1.03)
Globulin: 3.2 g/dL (calc) (ref 1.9–3.7)
Glucose, Bld: 101 mg/dL — ABNORMAL HIGH (ref 65–99)
Potassium: 4.6 mmol/L (ref 3.5–5.3)
Sodium: 140 mmol/L (ref 135–146)
Total Bilirubin: 0.3 mg/dL (ref 0.2–1.2)
Total Protein: 7.1 g/dL (ref 6.1–8.1)
eGFR: 106 mL/min/{1.73_m2} (ref >=60)

## 2022-09-11 LAB — CBC WITH DIFFERENTIAL/PLATELET
Absolute Monocytes: 581 cells/uL (ref 200–950)
Basophils Relative: 0.3 %
Eosinophils Absolute: 114 cells/uL (ref 15–500)
HCT: 38.2 % (ref 35.0–45.0)
Hemoglobin: 12.6 g/dL (ref 11.7–15.5)
Lymphs Abs: 2482 cells/uL (ref 850–3900)
MCH: 29.4 pg (ref 27.0–33.0)
MCHC: 33 g/dL (ref 32.0–36.0)
MCV: 89.3 fL (ref 80.0–100.0)
MPV: 10 fL (ref 7.5–12.5)
Monocytes Relative: 6.6 %
Neutro Abs: 5597 cells/uL (ref 1500–7800)
RBC: 4.28 10*6/uL (ref 3.80–5.10)
RDW: 16.1 % — ABNORMAL HIGH (ref 11.0–15.0)
Total Lymphocyte: 28.2 %

## 2022-09-11 MED ORDER — OTREXUP 25 MG/0.4ML ~~LOC~~ SOAJ
SUBCUTANEOUS | 2 refills | Status: DC
Start: 2022-09-11 — End: 2022-12-12
  Filled 2022-09-11: qty 1.6, 28d supply, fill #0
  Filled 2022-10-07: qty 1.6, 28d supply, fill #1
  Filled 2022-10-31: qty 1.6, 28d supply, fill #2

## 2022-09-11 MED ORDER — RINVOQ 15 MG PO TB24
15.0000 mg | ORAL_TABLET | Freq: Every day | ORAL | 2 refills | Status: DC
Start: 2022-09-11 — End: 2022-12-12
  Filled 2022-09-11: qty 30, 30d supply, fill #0
  Filled 2022-10-07 – 2022-10-16 (×3): qty 30, 30d supply, fill #1
  Filled 2022-10-31: qty 30, 30d supply, fill #2

## 2022-09-11 NOTE — Telephone Encounter (Signed)
Last Fill: 06/04/2022  Labs: 09/10/2022 CBC and CMP are stable.   TB Gold: 11/08/2021 Neg    Next Visit: 10/29/2022  Last Visit: 05/27/2022  JX:[id number] arthritis involving multiple sites with positive rheumatoid factor   Current Dose per office note 06/06/2022: Rinvoq 15 mg 1 tablet by mouth daily methotrexate 25 mg sq injections once weekly   Okay to refill Rinvoq and MTX?

## 2022-09-11 NOTE — Progress Notes (Signed)
CBC and CMP are stable.

## 2022-09-17 ENCOUNTER — Other Ambulatory Visit (HOSPITAL_COMMUNITY): Payer: Self-pay

## 2022-10-06 ENCOUNTER — Telehealth: Payer: Self-pay | Admitting: Pharmacist

## 2022-10-06 NOTE — Telephone Encounter (Signed)
Received fax from [facility]. PA renewal request has been cancelled since PA is active through 10/27/2022. Will need to run PA closer to expiration date  Chesley Mires, PharmD, MPH, BCPS, CPP Clinical Pharmacist (Rheumatology and Pulmonology)

## 2022-10-06 NOTE — Telephone Encounter (Signed)
Per [place], PA set to expire. Submitted a Prior Authorization renewal request to [facility] for RINVOQ via CoverMyMeds. Will update once we receive a response.  Key: [id number]  Chesley Mires, PharmD, MPH, BCPS, CPP Clinical Pharmacist (Rheumatology and Pulmonology)

## 2022-10-07 ENCOUNTER — Other Ambulatory Visit (HOSPITAL_COMMUNITY): Payer: Self-pay

## 2022-10-10 ENCOUNTER — Other Ambulatory Visit (HOSPITAL_COMMUNITY): Payer: Self-pay

## 2022-10-14 ENCOUNTER — Other Ambulatory Visit (HOSPITAL_COMMUNITY): Payer: Self-pay

## 2022-10-15 ENCOUNTER — Other Ambulatory Visit (HOSPITAL_COMMUNITY): Payer: Self-pay

## 2022-10-16 ENCOUNTER — Other Ambulatory Visit (HOSPITAL_COMMUNITY): Payer: Self-pay

## 2022-10-16 NOTE — Progress Notes (Deleted)
Office Visit Note  Patient: Donna Lopez             Date of Birth: [birth year]           MRN: [id number]             PCP: Melida Quitter, MD Referring: Melida Quitter, MD Visit Date: 10/29/2022 Occupation: @GUAROCC @  Subjective:  No chief complaint on file.   History of Present Illness: TREYONNA Lopez is a 57 y.o. female ***     Activities of Daily Living:  Patient reports morning stiffness for *** {minute/hour:19697}.   Patient {ACTIONS;DENIES/REPORTS:21021675::"Denies"} nocturnal pain.  Difficulty dressing/grooming: {ACTIONS;DENIES/REPORTS:21021675::"Denies"} Difficulty climbing stairs: {ACTIONS;DENIES/REPORTS:21021675::"Denies"} Difficulty getting out of chair: {ACTIONS;DENIES/REPORTS:21021675::"Denies"} Difficulty using hands for taps, buttons, cutlery, and/or writing: {ACTIONS;DENIES/REPORTS:21021675::"Denies"}  No Rheumatology ROS completed.   PMFS History:  Patient Active Problem List   Diagnosis Date Noted   Dysphagia 06/25/2022   Age-related osteoporosis without current pathological fracture 08/15/2021   Pleural effusion on left 04/22/2021   Preventative health care 04/01/2021   Unilateral primary osteoarthritis, left knee 12/17/2018   Status post total left knee replacement 12/17/2018   Primary osteoarthritis of left knee 11/23/2018   Carpal tunnel syndrome, left upper limb 11/23/2018   BMI 40.0-44.9, adult (HCC) 07/12/2018   Primary osteoarthritis of both knees 12/24/2016   DDD (degenerative disc disease), thoracic 11/17/2016   DDD (degenerative disc disease), lumbar 11/17/2016   Vitamin D deficiency 11/17/2016   History of rosacea/ History of acne 11/17/2016   High risk medications (not anticoagulants) long-term use 06/25/2016   History of degenerative disc disease 06/25/2016   Severe obesity (BMI >= 40) (HCC) 06/13/2014   Left-sided thoracic back pain 07/19/2013   PALPITATIONS 10/17/2009   UTI 05/24/2009   Rheumatoid arthritis (HCC) 05/24/2009    Hair loss 02/22/2009   VAGINAL BLEEDING 10/05/2008   Carrier of group B Streptococcus 07/28/2007   GERD 12/28/2006   CARDIAC MURMUR, HX OF 12/28/2006   HIATAL HERNIA, HX OF 12/28/2006    Past Medical History:  Diagnosis Date   Allergy    Arthritis    Carpal tunnel syndrome 10/2018   DDD (degenerative disc disease), lumbar    DDD (degenerative disc disease), thoracic    Family history of adverse reaction to anesthesia    Mothers sister never woke up from surgery   GERD (gastroesophageal reflux disease)    Grade I diastolic dysfunction 2013   Heart murmur    Heart palpitations    History of   History of hiatal hernia    OA (osteoarthritis)    Obese    Osteoporosis    Rheumatoid arthritis (HCC)    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    Vitamin D deficiency     Family History  Problem Relation Age of Onset   Uterine cancer Mother    Diabetes Father    Kidney cancer Father    Colon cancer Sister 41   Rectal cancer Sister    Colon cancer Paternal Grandfather    Colon polyps Brother    Cancer Sister        breast    Healthy Daughter    Esophageal cancer Neg Hx    Stomach cancer Neg Hx    Past Surgical History:  Procedure Laterality Date   COLONOSCOPY  2014   DB- normal    ESOPHAGEAL MANOMETRY N/A 04/23/2022   Procedure: ESOPHAGEAL MANOMETRY (EM);  Surgeon: Napoleon Form, MD;  Location: [facility];  Service: Gastroenterology;  Laterality: N/A;  KNEE ARTHROPLASTY     THORACENTESIS  04/2021   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   Procedure: LEFT TOTAL KNEE ARTHROPLASTY;  Surgeon: Kathryne Hitch, MD;  Location: [facility];  Service: Orthopedics;  Laterality: Left;   UPPER GASTROINTESTINAL ENDOSCOPY     Social History   Social History Narrative   Exercise--- sometimes walks 1x a week   Immunization History  Administered Date(s) Administered   Influenza Whole 02/08/2009   Influenza,inj,Quad PF,6+ Mos 03/09/2013, 06/13/2014, 06/16/2016, 02/14/2019, 03/30/2020,  04/01/2021   PFIZER(Purple Top)SARS-COV-2 Vaccination 08/26/2019, 09/27/2019, 05/30/2020   PNEUMOCOCCAL CONJUGATE-20 04/01/2021   Td 09/29/2001   Tdap 06/13/2014     Objective: Vital Signs: LMP 08/18/2018    Physical Exam   Musculoskeletal Exam: ***  CDAI Exam: CDAI Score: -- Patient Global: --; Provider Global: -- Swollen: --; Tender: -- Joint Exam 10/29/2022   No joint exam has been documented for this visit   There is currently no information documented on the homunculus. Go to the Rheumatology activity and complete the homunculus joint exam.  Investigation: No additional findings.  Imaging: No results found.  Recent Labs: Lab Results  Component Value Date   WBC 8.8 09/10/2022   HGB 12.6 09/10/2022   PLT 321 09/10/2022   NA 140 09/10/2022   K 4.6 09/10/2022   CL 103 09/10/2022   CO2 27 09/10/2022   GLUCOSE 101 (H) 09/10/2022   BUN 17 09/10/2022   CREATININE 0.57 09/10/2022   BILITOT 0.3 09/10/2022   ALKPHOS 69 11/08/2021   AST 17 09/10/2022   ALT 18 09/10/2022   PROT 7.1 09/10/2022   ALBUMIN 4.3 11/08/2021   CALCIUM 9.1 09/10/2022   GFRAA 114 10/12/2020   QFTBGOLDPLUS Negative 11/08/2021    Speciality Comments: Humira- quit working Simponi- inadequate response Orencia-since 07/2019 Reclast start 07/18/20, September 06, 2021  Procedures:  No procedures performed Allergies: Penicillins   Assessment / Plan:     Visit Diagnoses: No diagnosis found.  Orders: No orders of the defined types were placed in this encounter.  No orders of the defined types were placed in this encounter.   Face-to-face time spent with patient was *** minutes. Greater than 50% of time was spent in counseling and coordination of care.  Follow-Up Instructions: No follow-ups on file.   Ellen Henri, CMA  Note - This record has been created using Animal nutritionist.  Chart creation errors have been sought, but may not always  have been located. Such creation errors do  not reflect on  the standard of medical care.

## 2022-10-17 ENCOUNTER — Other Ambulatory Visit (HOSPITAL_COMMUNITY): Payer: Self-pay

## 2022-10-17 ENCOUNTER — Telehealth: Payer: Self-pay | Admitting: Pharmacist

## 2022-10-17 NOTE — Telephone Encounter (Signed)
Received notification from [facility] that Otrexup PA has expired. Submitted a Prior Authorization renewal request to [facility] for OTREXUP via CoverMyMeds. Pending clinical questions to populate  Key: [facility]

## 2022-10-17 NOTE — Telephone Encounter (Signed)
Clinical questions completed and submitted as URGENT. May be denied since patient has not tried/failed generic MTX vial/syringe  Chesley Mires, PharmD, MPH, BCPS, CPP Clinical Pharmacist (Rheumatology and Pulmonology)

## 2022-10-20 ENCOUNTER — Other Ambulatory Visit (HOSPITAL_COMMUNITY): Payer: Self-pay

## 2022-10-21 ENCOUNTER — Other Ambulatory Visit (HOSPITAL_COMMUNITY): Payer: Self-pay

## 2022-10-22 ENCOUNTER — Other Ambulatory Visit (HOSPITAL_COMMUNITY): Payer: Self-pay

## 2022-10-22 ENCOUNTER — Other Ambulatory Visit: Payer: Self-pay

## 2022-10-22 NOTE — Telephone Encounter (Signed)
Received notification from [facility] regarding a prior authorization for OTREXUP. Authorization has been APPROVED from 10/17/22 to 10/17/23. Approval letter sent to scan center.  Patient can continue to fill through [facility]: [phone]   Authorization # [id number] Phone # [phone]  Tamra, CPhT at spec pharmacy notified by [facility] to reprocess claim.  Chesley Mires, PharmD, MPH, BCPS, CPP Clinical Pharmacist (Rheumatology and Pulmonology)

## 2022-10-28 DIAGNOSIS — D225 Melanocytic nevi of trunk: Secondary | ICD-10-CM | POA: Diagnosis not present

## 2022-10-28 DIAGNOSIS — D2272 Melanocytic nevi of left lower limb, including hip: Secondary | ICD-10-CM | POA: Diagnosis not present

## 2022-10-28 DIAGNOSIS — D2262 Melanocytic nevi of left upper limb, including shoulder: Secondary | ICD-10-CM | POA: Diagnosis not present

## 2022-10-28 DIAGNOSIS — D2261 Melanocytic nevi of right upper limb, including shoulder: Secondary | ICD-10-CM | POA: Diagnosis not present

## 2022-10-29 ENCOUNTER — Other Ambulatory Visit: Payer: Self-pay

## 2022-10-29 ENCOUNTER — Ambulatory Visit: Payer: BC Managed Care – PPO | Admitting: Rheumatology

## 2022-10-29 DIAGNOSIS — M810 Age-related osteoporosis without current pathological fracture: Secondary | ICD-10-CM

## 2022-10-29 DIAGNOSIS — M1711 Unilateral primary osteoarthritis, right knee: Secondary | ICD-10-CM

## 2022-10-29 DIAGNOSIS — Z872 Personal history of diseases of the skin and subcutaneous tissue: Secondary | ICD-10-CM

## 2022-10-29 DIAGNOSIS — M0579 Rheumatoid arthritis with rheumatoid factor of multiple sites without organ or systems involvement: Secondary | ICD-10-CM

## 2022-10-29 DIAGNOSIS — M545 Low back pain, unspecified: Secondary | ICD-10-CM

## 2022-10-29 DIAGNOSIS — Z8719 Personal history of other diseases of the digestive system: Secondary | ICD-10-CM

## 2022-10-29 DIAGNOSIS — E559 Vitamin D deficiency, unspecified: Secondary | ICD-10-CM

## 2022-10-29 DIAGNOSIS — Z96652 Presence of left artificial knee joint: Secondary | ICD-10-CM

## 2022-10-29 DIAGNOSIS — M5136 Other intervertebral disc degeneration, lumbar region: Secondary | ICD-10-CM

## 2022-10-29 DIAGNOSIS — Z79899 Other long term (current) drug therapy: Secondary | ICD-10-CM

## 2022-10-29 DIAGNOSIS — M5134 Other intervertebral disc degeneration, thoracic region: Secondary | ICD-10-CM

## 2022-10-29 DIAGNOSIS — J90 Pleural effusion, not elsewhere classified: Secondary | ICD-10-CM

## 2022-10-29 NOTE — Progress Notes (Signed)
Office Visit Note  Patient: Donna Lopez             Date of Birth: October 20, 1965           MRN: [id number]             PCP: Melida Quitter, MD Referring: Melida Quitter, MD Visit Date: 11/12/2022 Occupation: @GUAROCC @  Subjective:  No chief complaint on file.   History of Present Illness: Donna Lopez is a 57 y.o. female with seropositive rheumatoid arthritis, osteoarthritis, degenerative disc disease and osteoporosis.  She states she continues to have some discomfort in her left knee joint and instability.  She has not noticed any joint swelling.  She has been taking Rinvoq 15 mg p.o. daily, methotrexate 25 mg subcu weekly and folic acid 2 mg p.o. daily without any interruption.  She denies any side effects from the medications.  She had no interruption in the treatment.  She shortness of breath with exertion only.  She states the shortness of breath has not changed.    Activities of Daily Living:  Patient reports morning stiffness for a few minutes.   Patient Denies nocturnal pain.  Difficulty dressing/grooming: Denies Difficulty climbing stairs: Denies Difficulty getting out of chair: Denies Difficulty using hands for taps, buttons, cutlery, and/or writing: Denies  Review of Systems  Constitutional:  Negative for fatigue.  HENT:  Negative for mouth sores and mouth dryness.   Eyes:  Negative for dryness.  Respiratory:  Positive for shortness of breath. Negative for difficulty breathing.   Cardiovascular:  Negative for chest pain and palpitations.  Gastrointestinal:  Negative for blood in stool, constipation and diarrhea.  Endocrine: Negative for increased urination.  Genitourinary:  Negative for involuntary urination.  Musculoskeletal:  Positive for joint pain, joint pain and morning stiffness. Negative for gait problem, joint swelling, myalgias, muscle weakness, muscle tenderness and myalgias.  Skin:  Negative for color change, rash, hair loss and sensitivity to sunlight.   Allergic/Immunologic: Negative for susceptible to infections.  Neurological:  Negative for dizziness and headaches.  Hematological:  Negative for swollen glands.  Psychiatric/Behavioral:  Negative for depressed mood and sleep disturbance. The patient is not nervous/anxious.     PMFS History:  Patient Active Problem List   Diagnosis Date Noted   Dysphagia 06/25/2022   Age-related osteoporosis without current pathological fracture 08/15/2021   Pleural effusion on left 04/22/2021   Preventative health care 04/01/2021   Unilateral primary osteoarthritis, left knee 12/17/2018   Status post total left knee replacement 12/17/2018   Primary osteoarthritis of left knee 11/23/2018   Carpal tunnel syndrome, left upper limb 11/23/2018   BMI 40.0-44.9, adult (HCC) 07/12/2018   Primary osteoarthritis of both knees 12/24/2016   DDD (degenerative disc disease), thoracic 11/17/2016   DDD (degenerative disc disease), lumbar 11/17/2016   Vitamin D deficiency 11/17/2016   History of rosacea/ History of acne 11/17/2016   High risk medications (not anticoagulants) long-term use 06/25/2016   History of degenerative disc disease 06/25/2016   Severe obesity (BMI >= 40) (HCC) 06/13/2014   Left-sided thoracic back pain 07/19/2013   PALPITATIONS 10/17/2009   UTI 05/24/2009   Rheumatoid arthritis (HCC) 05/24/2009   Hair loss 02/22/2009   VAGINAL BLEEDING 10/05/2008   Carrier of group B Streptococcus 07/28/2007   GERD 12/28/2006   CARDIAC MURMUR, HX OF 12/28/2006   HIATAL HERNIA, HX OF 12/28/2006    Past Medical History:  Diagnosis Date   Allergy    Arthritis  Carpal tunnel syndrome 10/2018   DDD (degenerative disc disease), lumbar    DDD (degenerative disc disease), thoracic    Family history of adverse reaction to anesthesia    Mothers sister never woke up from surgery   GERD (gastroesophageal reflux disease)    Grade I diastolic dysfunction 2013   Heart murmur    Heart palpitations     History of   History of hiatal hernia    OA (osteoarthritis)    Obese    Osteoporosis    Rheumatoid arthritis (HCC)    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    Vitamin D deficiency     Family History  Problem Relation Age of Onset   Uterine cancer Mother    Diabetes Father    Kidney cancer Father    Colon cancer Sister 57   Rectal cancer Sister    Colon cancer Paternal Grandfather    Colon polyps Brother    Cancer Sister        breast    Healthy Daughter    Esophageal cancer Neg Hx    Stomach cancer Neg Hx    Past Surgical History:  Procedure Laterality Date   COLONOSCOPY  2014   DB- normal    ESOPHAGEAL MANOMETRY N/A 04/23/2022   Procedure: ESOPHAGEAL MANOMETRY (EM);  Surgeon: Napoleon Form, MD;  Location: [facility];  Service: Gastroenterology;  Laterality: N/A;   KNEE ARTHROPLASTY     THORACENTESIS  04/2021   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   Procedure: LEFT TOTAL KNEE ARTHROPLASTY;  Surgeon: Kathryne Hitch, MD;  Location: [facility];  Service: Orthopedics;  Laterality: Left;   UPPER GASTROINTESTINAL ENDOSCOPY     Social History   Social History Narrative   Exercise--- sometimes walks 1x a week   Immunization History  Administered Date(s) Administered   Influenza Whole 02/08/2009   Influenza,inj,Quad PF,6+ Mos 03/09/2013, 06/13/2014, 06/16/2016, 02/14/2019, 03/30/2020, 04/01/2021   PFIZER(Purple Top)SARS-COV-2 Vaccination 08/26/2019, 09/27/2019, 05/30/2020   PNEUMOCOCCAL CONJUGATE-20 04/01/2021   Td 09/29/2001   Tdap 06/13/2014     Objective: Vital Signs: BP 132/82 (BP Location: Left Arm, Patient Position: Sitting, Cuff Size: Large)   Pulse 85   Resp 16   Ht 5\' 2"  (1.575 m)   Wt 239 lb 3.2 oz (108.5 kg)   LMP 08/18/2018   BMI 43.75 kg/m    Physical Exam Vitals and nursing note reviewed.  Constitutional:      Appearance: She is well-developed.  HENT:     Head: Normocephalic and atraumatic.  Eyes:     Conjunctiva/sclera: Conjunctivae normal.   Cardiovascular:     Rate and Rhythm: Normal rate and regular rhythm.     Heart sounds: Normal heart sounds.  Pulmonary:     Effort: Pulmonary effort is normal.     Breath sounds: Normal breath sounds.  Abdominal:     General: Bowel sounds are normal.     Palpations: Abdomen is soft.  Musculoskeletal:     Cervical back: Normal range of motion.  Lymphadenopathy:     Cervical: No cervical adenopathy.  Skin:    General: Skin is warm and dry.     Capillary Refill: Capillary refill takes less than 2 seconds.  Neurological:     Mental Status: She is alert and oriented to person, place, and time.  Psychiatric:        Behavior: Behavior normal.      Musculoskeletal Exam: Cervical spine was in good range of motion.  Thoracic kyphosis was noted.  She  had no tenderness over thoracic or lumbar spine.  Shoulders and elbow joints were in good range of motion.  She had limited extension and flexion of her right wrist joint with synovial thickening without synovitis.  Left wrist joint has mild limitation with range of motion without synovitis.  Bilateral MCP PIP and DIP thickening with no synovitis was noted.  Hip joints and knee joints in good range of motion.  Left knee joint was replaced without any warmth swelling or effusion.  There was no tenderness over MTPs.  CDAI Exam: CDAI Score: 5  Patient Global: 20 / 100; Provider Global: 20 / 100 Swollen: 0 ; Tender: 1  Joint Exam 11/12/2022      Right  Left  Knee      Tender     Investigation: No additional findings.  Imaging: No results found.  Recent Labs: Lab Results  Component Value Date   WBC 8.8 09/10/2022   HGB 12.6 09/10/2022   PLT 321 09/10/2022   NA 140 09/10/2022   K 4.6 09/10/2022   CL 103 09/10/2022   CO2 27 09/10/2022   GLUCOSE 101 (H) 09/10/2022   BUN 17 09/10/2022   CREATININE 0.57 09/10/2022   BILITOT 0.3 09/10/2022   ALKPHOS 69 11/08/2021   AST 17 09/10/2022   ALT 18 09/10/2022   PROT 7.1 09/10/2022    ALBUMIN 4.3 11/08/2021   CALCIUM 9.1 09/10/2022   GFRAA 114 10/12/2020   QFTBGOLDPLUS Negative 11/08/2021    Speciality Comments: Humira- quit working Simponi- inadequate response Orencia-since 07/2019 Reclast start 07/18/20, September 06, 2021  Procedures:  No procedures performed Allergies: Penicillins   Assessment / Plan:     Visit Diagnoses: Rheumatoid arthritis involving multiple sites with positive rheumatoid factor (HCC) - Positive rheumatoid factor, positive anti-CCP, erosive disease: Patient states she has been doing well on the current combination.  She denies any history of recent flare or any flares since the last visit.  She continues to take Rinvoq, methotrexate and folic acid on a regular basis without any interruption.  High risk medications (not anticoagulants) long-term use - Rinvoq 15 mg 1 tablet by mouth daily (started in July 2022), methotrexate 25 mg sq injections once weekly, Folic acid 2 mg daily -Labs obtained on September 10, 2022 CBC and CMP were normal which were reviewed.  TB Gold was negative on November 08, 2021.  She was advised to get labs in July and every 3 months.  She will get TB Gold with her next labs.  Plan: QuantiFERON-TB Gold Plus.  Information on immunization was placed in the AVS.  She was advised to hold Rinvoq and methotrexate if she develops an infection resume after the infection resolves.  Increased risk of for skin cancer with Rinvoq was discussed.  Use of sunscreen and sun protection was discussed.  Annual skin examination to screen for skin cancer was advised.  FDA black box warning for major adverse cardiovascular events, thrombosis, mortality and serious infections in association with roadwork were reviewed .  Pleural effusion, left - Chest x-ray on 04/16/21: findings: small left effusion and bibasilar atelectasis.  Underwent thoracentesis on 04/25/2021.  Primary osteoarthritis of right knee-she denies any discomfort today.  No warmth swelling or  effusion was noted.  S/P total knee arthroplasty, left - 12/17/2018 by Dr. Magnus Ivan.  Patient states she continues to have some instability and discomfort in her left knee.  No warmth swelling or effusion was noted.  DDD (degenerative disc disease), thoracic-she had postural kyphosis without  any discomfort.  DDD (degenerative disc disease), lumbar-she denies any discomfort today.  She had no point tenderness.  Age-related osteoporosis without current pathological fracture - DEXA updated on 03/05/2022: Left femoral neck BMD 0.605 with T-score -2.2. last IV Reclast infusion was administered on 09/06/21.  She is on drug holiday currently.  Will repeat DEXA scan in 2025.  Vitamin D deficiency-she takes vitamin D supplement.  Vitamin D was normal at 42 on August 14, 2021.  History of hiatal hernia  History of gastroesophageal reflux (GERD)  History of rosacea/ History of acne  Orders: Orders Placed This Encounter  Procedures   QuantiFERON-TB Gold Plus   No orders of the defined types were placed in this encounter.    Follow-Up Instructions: Return in about 5 months (around 04/14/2023) for Rheumatoid arthritis, Osteoarthritis.   Pollyann Savoy, MD  Note - This record has been created using Animal nutritionist.  Chart creation errors have been sought, but may not always  have been located. Such creation errors do not reflect on  the standard of medical care.

## 2022-10-30 DIAGNOSIS — F4323 Adjustment disorder with mixed anxiety and depressed mood: Secondary | ICD-10-CM | POA: Diagnosis not present

## 2022-10-31 ENCOUNTER — Other Ambulatory Visit: Payer: Self-pay

## 2022-11-07 ENCOUNTER — Other Ambulatory Visit (HOSPITAL_COMMUNITY): Payer: Self-pay

## 2022-11-10 ENCOUNTER — Other Ambulatory Visit: Payer: Self-pay

## 2022-11-10 ENCOUNTER — Telehealth: Payer: Self-pay

## 2022-11-10 ENCOUNTER — Other Ambulatory Visit (HOSPITAL_COMMUNITY): Payer: Self-pay

## 2022-11-10 NOTE — Telephone Encounter (Addendum)
Received notification from [facility] that pt's medication is now requiring a new PA.  Submitted an URGENT Prior Authorization request to [facility] for RINVOQ via CoverMyMeds. Will update once we receive a response.  Key: [id number]

## 2022-11-11 ENCOUNTER — Other Ambulatory Visit (HOSPITAL_COMMUNITY): Payer: Self-pay

## 2022-11-11 DIAGNOSIS — M069 Rheumatoid arthritis, unspecified: Secondary | ICD-10-CM | POA: Diagnosis not present

## 2022-11-11 DIAGNOSIS — E785 Hyperlipidemia, unspecified: Secondary | ICD-10-CM | POA: Diagnosis not present

## 2022-11-11 NOTE — Telephone Encounter (Signed)
Received notification from [facility] regarding a prior authorization for [facility]. Authorization has been APPROVED from 11/10/22 to 11/10/23. Approval letter sent to scan center.  Patient can continue to fill through [facility]: [phone]   Authorization # [id number] Phone # [phone]  [facility] notified and [employer] updated  Chesley Mires, PharmD, MPH, BCPS, CPP Clinical Pharmacist (Rheumatology and Pulmonology)

## 2022-11-12 ENCOUNTER — Other Ambulatory Visit (HOSPITAL_COMMUNITY): Payer: Self-pay

## 2022-11-12 ENCOUNTER — Encounter: Payer: Self-pay | Admitting: Rheumatology

## 2022-11-12 ENCOUNTER — Ambulatory Visit: Payer: BC Managed Care – PPO | Attending: Rheumatology | Admitting: Rheumatology

## 2022-11-12 ENCOUNTER — Other Ambulatory Visit: Payer: Self-pay

## 2022-11-12 VITALS — BP 132/82 | HR 85 | Resp 16 | Ht 62.0 in | Wt 239.2 lb

## 2022-11-12 DIAGNOSIS — M545 Low back pain, unspecified: Secondary | ICD-10-CM

## 2022-11-12 DIAGNOSIS — M1711 Unilateral primary osteoarthritis, right knee: Secondary | ICD-10-CM | POA: Diagnosis not present

## 2022-11-12 DIAGNOSIS — J90 Pleural effusion, not elsewhere classified: Secondary | ICD-10-CM

## 2022-11-12 DIAGNOSIS — Z96652 Presence of left artificial knee joint: Secondary | ICD-10-CM

## 2022-11-12 DIAGNOSIS — M0579 Rheumatoid arthritis with rheumatoid factor of multiple sites without organ or systems involvement: Secondary | ICD-10-CM | POA: Diagnosis not present

## 2022-11-12 DIAGNOSIS — E559 Vitamin D deficiency, unspecified: Secondary | ICD-10-CM

## 2022-11-12 DIAGNOSIS — M810 Age-related osteoporosis without current pathological fracture: Secondary | ICD-10-CM

## 2022-11-12 DIAGNOSIS — Z872 Personal history of diseases of the skin and subcutaneous tissue: Secondary | ICD-10-CM

## 2022-11-12 DIAGNOSIS — M5134 Other intervertebral disc degeneration, thoracic region: Secondary | ICD-10-CM

## 2022-11-12 DIAGNOSIS — Z8719 Personal history of other diseases of the digestive system: Secondary | ICD-10-CM

## 2022-11-12 DIAGNOSIS — M5136 Other intervertebral disc degeneration, lumbar region: Secondary | ICD-10-CM

## 2022-11-12 DIAGNOSIS — Z79899 Other long term (current) drug therapy: Secondary | ICD-10-CM | POA: Diagnosis not present

## 2022-11-12 NOTE — Patient Instructions (Signed)
Standing Labs We placed an order today for your standing lab work.   Please have your standing labs drawn in July and every 3 months TB Gold in July  Please have your labs drawn 2 weeks prior to your appointment so that the provider can discuss your lab results at your appointment, if possible.  Please note that you may see your imaging and lab results in MyChart before we have reviewed them. We will contact you once all results are reviewed. Please allow our office up to 72 hours to thoroughly review all of the results before contacting the office for clarification of your results.  WALK-IN LAB HOURS  Monday through Thursday from 8:00 am -12:30 pm and 1:00 pm-5:00 pm and Friday from 8:00 am-12:00 pm.  Patients with office visits requiring labs will be seen before walk-in labs.  You may encounter longer than normal wait times. Please allow additional time. Wait times may be shorter on  Monday and Thursday afternoons.  We do not book appointments for walk-in labs. We appreciate your patience and understanding with our staff.   Labs are drawn by [facility]. Please bring your co-pay at the time of your lab draw.  You may receive a bill from [facility] for your lab work.  Please note if you are on Hydroxychloroquine and and an order has been placed for a Hydroxychloroquine level,  you will need to have it drawn 4 hours or more after your last dose.  If you wish to have your labs drawn at another location, please call the office 24 hours in advance so we can fax the orders.  The office is located at [address], [address], [place], [place] [address]   If you have any questions regarding directions or hours of operation,  please call [phone].   As a reminder, please drink plenty of water prior to coming for your lab work. Thanks!   Vaccines You are taking a medication(s) that can suppress your immune system.  The following immunizations are recommended: Flu annually Covid-19  RSV Td/Tdap  (tetanus, diphtheria, pertussis) every 10 years Pneumonia (Prevnar 15 then Pneumovax 23 at least 1 year apart.  Alternatively, can take Prevnar 20 without needing additional dose) Shingrix: 2 doses from 4 weeks to 6 months apart  Please check with your PCP to make sure you are up to date.   If you have signs or symptoms of an infection or start antibiotics: First, call your PCP for workup of your infection. Hold your medication through the infection, until you complete your antibiotics, and until symptoms resolve if you take the following: Injectable medication (Actemra, Benlysta, Cimzia, Cosentyx, Enbrel, Humira, Kevzara, Orencia, Remicade, Simponi, Stelara, Taltz, Tremfya) Methotrexate Leflunomide (Arava) Mycophenolate (Cellcept) Osborne O[place], or Rinvoq   Because you are taking Harriette Ohara, Rinvoq, or Olumiant, it is very important to know that this class of medications has a FDA BLACK BOX WARNING for major adverse cardiovascular events (MACE), thrombosis, mortality (including sudden cardiovascular death), serious infections, and lymphomas. MACE is defined as cardiovascular death, myocardial infarction, and stroke. Thrombosis includes deep venous thrombosis (DVT), pulmonary embolism (PE), and arterial thrombosis. If you are a current or former smoker, you are at higher risk for MACE.    Please get an annual skin examination to screen for skin cancer.  Please use sunscreen and sun protection.

## 2022-11-25 DIAGNOSIS — F4323 Adjustment disorder with mixed anxiety and depressed mood: Secondary | ICD-10-CM | POA: Diagnosis not present

## 2022-11-27 ENCOUNTER — Other Ambulatory Visit: Payer: Self-pay | Admitting: Thoracic Surgery (Cardiothoracic Vascular Surgery)

## 2022-11-27 DIAGNOSIS — K449 Diaphragmatic hernia without obstruction or gangrene: Secondary | ICD-10-CM

## 2022-12-02 ENCOUNTER — Other Ambulatory Visit (HOSPITAL_COMMUNITY): Payer: Self-pay

## 2022-12-05 ENCOUNTER — Other Ambulatory Visit (HOSPITAL_COMMUNITY): Payer: Self-pay

## 2022-12-08 ENCOUNTER — Other Ambulatory Visit (HOSPITAL_COMMUNITY): Payer: Self-pay

## 2022-12-08 ENCOUNTER — Encounter (HOSPITAL_COMMUNITY): Payer: Self-pay

## 2022-12-12 ENCOUNTER — Other Ambulatory Visit: Payer: Self-pay | Admitting: Rheumatology

## 2022-12-12 ENCOUNTER — Other Ambulatory Visit (HOSPITAL_COMMUNITY): Payer: Self-pay

## 2022-12-12 ENCOUNTER — Other Ambulatory Visit: Payer: Self-pay

## 2022-12-12 DIAGNOSIS — M0579 Rheumatoid arthritis with rheumatoid factor of multiple sites without organ or systems involvement: Secondary | ICD-10-CM

## 2022-12-12 MED ORDER — OTREXUP 25 MG/0.4ML ~~LOC~~ SOAJ
SUBCUTANEOUS | 2 refills | Status: DC
Start: 2022-12-12 — End: 2023-01-23
  Filled 2022-12-12: qty 1.6, fill #0

## 2022-12-12 MED ORDER — RINVOQ 15 MG PO TB24
15.0000 mg | ORAL_TABLET | Freq: Every day | ORAL | 0 refills | Status: DC
Start: 2022-12-12 — End: 2022-12-18
  Filled 2022-12-12: qty 30, 30d supply, fill #0

## 2022-12-12 NOTE — Telephone Encounter (Signed)
Last Fill: 09/11/2022  Labs: 09/10/2022 CBC and CMP are stable.   TB Gold: 11/08/2021 Neg    Next Visit: 04/07/2023  Last Visit: 11/12/2022  HQ:[id number] arthritis involving multiple sites with positive rheumatoid factor   Current Dose per office note 11/12/2022: Rinvoq 15 mg 1 tablet by mouth daily, methotrexate 25 mg sq injections once weekly   Left message to advise patient she is due to update labs.   Okay to refill Rinvoq and Otrexup?

## 2022-12-17 ENCOUNTER — Other Ambulatory Visit (HOSPITAL_COMMUNITY): Payer: Self-pay

## 2022-12-17 ENCOUNTER — Other Ambulatory Visit: Payer: Self-pay

## 2022-12-17 ENCOUNTER — Telehealth: Payer: Self-pay

## 2022-12-17 DIAGNOSIS — M0579 Rheumatoid arthritis with rheumatoid factor of multiple sites without organ or systems involvement: Secondary | ICD-10-CM

## 2022-12-17 NOTE — Telephone Encounter (Signed)
Received notification from [facility] that pt has obtained new insurance and requires a new prior authorization.  Submitted an URGENT Prior Authorization request to [facility] for RINVOQ via CoverMyMeds. Will update once we receive a response.  Key: [id number]

## 2022-12-17 NOTE — Telephone Encounter (Signed)
Received notification from [facility] regarding a prior authorization for [facility]. Authorization has been APPROVED from 12/17/22 to 12/17/23. Approval letter sent to scan center.  Unable to run test claim because patient must fill [facility]: [phone]  Authorization # [id number]  BIN : [id number] PCN : [id number] Group ID : [id number] ID : [id number]  Patient due for labs prior to sending Rinvoq rx. MyChart message sent to pt.  Chesley Mires, PharmD, MPH, BCPS, CPP Clinical Pharmacist (Rheumatology and Pulmonology)

## 2022-12-18 ENCOUNTER — Other Ambulatory Visit: Payer: Self-pay | Admitting: *Deleted

## 2022-12-18 ENCOUNTER — Telehealth: Payer: Self-pay | Admitting: Pharmacist

## 2022-12-18 ENCOUNTER — Telehealth: Payer: Self-pay | Admitting: *Deleted

## 2022-12-18 DIAGNOSIS — Z79899 Other long term (current) drug therapy: Secondary | ICD-10-CM

## 2022-12-18 LAB — COMPLETE METABOLIC PANEL WITH GFR
AG Ratio: 1.2 (calc) (ref 1.0–2.5)
ALT: 23 U/L (ref 6–29)
AST: 24 U/L (ref 10–35)
Albumin: 4 g/dL (ref 3.6–5.1)
Alkaline phosphatase (APISO): 65 U/L (ref 37–153)
BUN: 16 mg/dL (ref 7–25)
CO2: 27 mmol/L (ref 20–32)
Calcium: 9.3 mg/dL (ref 8.6–10.4)
Chloride: 105 mmol/L (ref 98–110)
Creat: 0.52 mg/dL (ref 0.50–1.03)
Globulin: 3.3 g/dL (calc) (ref 1.9–3.7)
Glucose, Bld: 88 mg/dL (ref 65–99)
Potassium: 4.5 mmol/L (ref 3.5–5.3)
Sodium: 140 mmol/L (ref 135–146)
Total Bilirubin: 0.4 mg/dL (ref 0.2–1.2)
Total Protein: 7.3 g/dL (ref 6.1–8.1)
eGFR: 108 mL/min/{1.73_m2} (ref >=60)

## 2022-12-18 MED ORDER — RINVOQ 15 MG PO TB24
15.0000 mg | ORAL_TABLET | Freq: Every day | ORAL | 0 refills | Status: DC
Start: 2022-12-18 — End: 2023-01-06

## 2022-12-18 NOTE — Telephone Encounter (Addendum)
Patient has had change in insurance. Will need PA for Otrexup as well. Submitted a Prior Authorization request to [facility] for OTREXUP via CoverMyMeds.   Key: [id number]  Chesley Mires, PharmD, MPH, BCPS, CPP Clinical Pharmacist (Rheumatology and Pulmonology)

## 2022-12-18 NOTE — Telephone Encounter (Signed)
Received a message from [facility] PA Department.  Call back number [phone]  PA request received for Otrexup. They have some questions that were not on EPA.  Advised preferred by patient's insurance is Rasuvo or vial and syringe.   PA Number [id number]

## 2022-12-19 ENCOUNTER — Telehealth: Payer: Self-pay | Admitting: Pharmacist

## 2022-12-19 DIAGNOSIS — Z79899 Other long term (current) drug therapy: Secondary | ICD-10-CM

## 2022-12-19 DIAGNOSIS — M0579 Rheumatoid arthritis with rheumatoid factor of multiple sites without organ or systems involvement: Secondary | ICD-10-CM

## 2022-12-19 NOTE — Telephone Encounter (Signed)
Received a fax regarding Prior Authorization from [facility] for OTREXUP. Authorization has been DENIED because patient must try and fail preferred drugs: generic MTX injection and Rasuvo.  Please submit PA for RASUVO  Chesley Mires, PharmD, MPH, BCPS, CPP Clinical Pharmacist (Rheumatology and Pulmonology)

## 2022-12-19 NOTE — Telephone Encounter (Signed)
Submitted a Prior Authorization request to [facility] for RASUVO via CoverMyMeds. Will update once we receive a response.  Key: [id number]  Chesley Mires, PharmD, MPH, BCPS, CPP Clinical Pharmacist (Rheumatology and Pulmonology)

## 2022-12-19 NOTE — Progress Notes (Signed)
CBC and CMP normal

## 2022-12-21 NOTE — Progress Notes (Signed)
TB Gold negative

## 2022-12-22 ENCOUNTER — Telehealth: Payer: Self-pay | Admitting: Pharmacist

## 2022-12-22 NOTE — Telephone Encounter (Signed)
Medication Samples have been provided to the patient.  Drug name: Rinvoq 15mg  tab Qty: 14 tabs LOT: [id number] Exp.Date: 12/05/2023  Dosing instructions: take one tab by mouth once daily  The patient has been instructed regarding the correct time, dose, and frequency of taking this medication, including desired effects and most common side effects.   Murrell Redden 11:53 AM 12/22/2022

## 2022-12-23 NOTE — Telephone Encounter (Signed)
Clinical questions completed for Rasuvo PA  Chesley Mires, PharmD, MPH, BCPS, CPP Clinical Pharmacist (Rheumatology and Pulmonology)

## 2022-12-23 NOTE — Telephone Encounter (Signed)
Received a fax regarding Prior Authorization from [facility] for RASUVO. Authorization has been DENIED because patient must try and fail oral MTX tabs. Rep requested PA be resubmitted  Phone# [phone]  Submitted a Prior Authorization request to [facility] for RASUVO via CoverMyMeds. Pending clinical questions to populate  Key: [id number]  Chesley Mires, PharmD, MPH, BCPS, CPP Clinical Pharmacist (Rheumatology and Pulmonology)

## 2022-12-24 ENCOUNTER — Telehealth: Payer: Self-pay | Admitting: *Deleted

## 2022-12-24 NOTE — Telephone Encounter (Signed)
[facility] PA Department.   PA Number [id number]  Lynett Fish number [phone]

## 2022-12-25 NOTE — Telephone Encounter (Signed)
Received fax requesting additional clinicals that patient has failed p.o. MTX and generic MTX injection. Faxed notes showing switching from p.o. to Rasuvo and positive response as well as limited hand dexterity to self-inject  Case # [id number]   Chesley Mires, PharmD, MPH, BCPS, CPP Clinical Pharmacist (Rheumatology and Pulmonology)

## 2022-12-25 NOTE — Telephone Encounter (Signed)
Received fax requesting additional clinicals that patient has failed p.o. MTX and generic MTX injection. Faxed notes showing switching from p.o. to Rasuvo and positive response as well as limited hand dexterity to self-inject   Case # [id number]

## 2022-12-26 MED ORDER — METHOTREXATE SODIUM 2.5 MG PO TABS
25.0000 mg | ORAL_TABLET | ORAL | 0 refills | Status: DC
Start: 2022-12-26 — End: 2023-03-03

## 2022-12-26 NOTE — Telephone Encounter (Signed)
[facility]ed [facility] to provide clearance to ship Rinvoq to patient.  Chesley Mires, PharmD, MPH, BCPS, CPP Clinical Pharmacist (Rheumatology and Pulmonology)

## 2022-12-26 NOTE — Addendum Note (Signed)
Addended by: Murrell Redden on: 12/26/2022 09:29 AM   Modules accepted: Orders

## 2022-12-29 ENCOUNTER — Telehealth: Payer: Self-pay | Admitting: Rheumatology

## 2022-12-29 NOTE — Telephone Encounter (Signed)
This was already addressed on Friday, 12/26/22. I called [facility] to provide okay to fill Rinvoq and verbalize our awareness of her cardiac history. Please see MyChart message  Chesley Mires, PharmD, MPH, BCPS, CPP Clinical Pharmacist (Rheumatology and Pulmonology)

## 2022-12-29 NOTE — Telephone Encounter (Signed)
[facility] left a voicemail stating patient was given a prescription for Rinvoq from [employer].  However, the patient reports having a heart condition and with Rinvoq there is an increased risk of cardiovascular events in patients that have certain heart conditions.  Please call and confirm that is okay to dispense the medication.  Phone #[phone]

## 2023-01-05 ENCOUNTER — Other Ambulatory Visit: Payer: Self-pay | Admitting: Physician Assistant

## 2023-01-05 DIAGNOSIS — M0579 Rheumatoid arthritis with rheumatoid factor of multiple sites without organ or systems involvement: Secondary | ICD-10-CM

## 2023-01-06 NOTE — Telephone Encounter (Signed)
Last Fill: 12/18/2022 (30 day supply)  Labs: 12/18/2022 CBC and CMP normal.   TB Gold: 12/18/2022 Neg    Next Visit: 04/07/2023  Last Visit: 11/12/2022  NG:[id number] arthritis involving multiple sites with positive rheumatoid factor   Current Dose per office note 11/12/2022: Rinvoq 15 mg 1 tablet by mouth daily   Okay to refill Rinvoq?

## 2023-01-20 ENCOUNTER — Other Ambulatory Visit: Payer: Self-pay

## 2023-01-20 NOTE — Telephone Encounter (Signed)
Contacted [facility] for update on PA for Rasuvo. Rep informed me that the application was closed out due to Rasuvo not requiring a PA. She is faxing this information to clinic. The document should arrive by end of day.   Sofie Rower, PharmD, [employer] PGY1

## 2023-01-23 ENCOUNTER — Encounter: Payer: Self-pay | Admitting: Thoracic Surgery (Cardiothoracic Vascular Surgery)

## 2023-01-23 ENCOUNTER — Ambulatory Visit: Payer: 59 | Admitting: Thoracic Surgery (Cardiothoracic Vascular Surgery)

## 2023-01-23 ENCOUNTER — Encounter: Payer: Self-pay | Admitting: *Deleted

## 2023-01-23 ENCOUNTER — Other Ambulatory Visit (HOSPITAL_COMMUNITY): Payer: Self-pay

## 2023-01-23 ENCOUNTER — Ambulatory Visit
Admission: RE | Admit: 2023-01-23 | Discharge: 2023-01-23 | Disposition: A | Discharge status: Home / Self Care | Payer: 59 | Source: Ambulatory Visit | Attending: Thoracic Surgery (Cardiothoracic Vascular Surgery)

## 2023-01-23 VITALS — BP 134/85 | HR 88 | Resp 20 | Ht 62.0 in | Wt 235.0 lb

## 2023-01-23 DIAGNOSIS — K449 Diaphragmatic hernia without obstruction or gangrene: Secondary | ICD-10-CM

## 2023-01-23 MED ORDER — RASUVO 25 MG/0.5ML ~~LOC~~ SOAJ
25.0000 mg | SUBCUTANEOUS | 1 refills | Status: DC
Start: 2023-01-23 — End: 2023-03-03

## 2023-01-23 NOTE — Telephone Encounter (Signed)
Attempted to run test claim based on Tolu's note - no rejection for PA required however [facility] is locked [facility] Rx sent today  Left VM for pt  MyChart message sent with copay card: BIN: [id number] PCN: [id number] Group: [id number] ID: [id number]   Will await f/u if any  Chesley Mires, PharmD, MPH, BCPS, CPP Clinical Pharmacist (Rheumatology and Pulmonology)

## 2023-01-23 NOTE — Progress Notes (Signed)
[address]       [place] [address]             [phone]                                                   [name] [facility] Medical Record #[id number] Date of Birth: 1966/02/19   Referring: Napoleon Form, MD Primary Care: Melida Quitter, MD Primary Cardiologist: None   Chief Complaint:        Chief Complaint  Patient presents with   Hiatal Hernia      Chest CT 10/30, Endo 10/9, manometry 12/6      History of Present Illness:    Donna Lopez 57 y.o. female presents for surgical evaluation of a large hiatal hernia.  She states that she is known about this for over 20 years but recently she has had worsening reflux and dysphagia.  On occasion she also has some odynophagia.  She also states that after having the size meals she has worsening shortness of breath.  Her weight has been stable.  She has had an intentional 10 pound weight loss.   Her symptoms are unchanged since her last appointment.          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     Vitamin D deficiency                 Past Surgical History:  Procedure Laterality Date   COLONOSCOPY   2014    DB- normal    ESOPHAGEAL MANOMETRY N/A 04/23/2022    Procedure: ESOPHAGEAL MANOMETRY (EM);  Surgeon: Napoleon Form, MD;  Location: [facility];  Service: Gastroenterology;  Laterality: N/A;   KNEE ARTHROPLASTY       THORACENTESIS   04/2021   TOTAL KNEE  ARTHROPLASTY Left 12/17/2018    Procedure: LEFT TOTAL KNEE ARTHROPLASTY;  Surgeon: Kathryne Hitch, MD;  Location: [facility];  Service: Orthopedics;  Laterality: Left;   UPPER GASTROINTESTINAL ENDOSCOPY                   Family History  Problem Relation Age of Onset   Uterine cancer Mother     Diabetes Father     Kidney cancer Father     Colon cancer Sister 48   Rectal cancer Sister     Colon cancer Paternal Grandfather     Colon polyps Brother     Cancer Sister          breast    Healthy Daughter     Esophageal cancer Neg Hx  Stomach cancer Neg Hx              Tobacco Use History  Social History        Tobacco Use  Smoking Status Never   Passive exposure: Never  Smokeless Tobacco Never      Social History        Substance and Sexual Activity  Alcohol Use Yes    Comment: rare alcohol use-once/twice a year        Allergies       Allergies  Allergen Reactions   Penicillins Hives      Did it involve swelling of the face/tongue/throat, SOB, or low BP? No Did it involve sudden or severe rash/hives, skin peeling, or any reaction on the inside of your mouth or nose? No Did you need to seek medical attention at a hospital or doctor's office? Yes When did it last happen?      15 -20 years ago If all above answers are "NO", may proceed with cephalosporin use.                Current Outpatient Medications  Medication Sig Dispense Refill   aspirin 81 MG chewable tablet Chew 1 tablet (81 mg total) by mouth 2 (two) times daily. (Patient taking differently: Chew 81 mg by mouth daily.) 30 tablet 0   Calcium Carb-Cholecalciferol (CALCIUM + D3) 600-200 MG-UNIT TABS Take 1 capsule by mouth daily.       Cholecalciferol (VITAMIN D) 50 MCG (2000 UT) CAPS Take 2,000 Units by mouth daily.       esomeprazole (NEXIUM) 20 MG capsule Take 1 capsule (20 mg total) by mouth 2 (two) times daily before a meal. 60 capsule 1   folic acid (FOLVITE) 1 MG tablet TAKE 2 TABLETS BY  MOUTH EVERY DAY 180 tablet 3   Glucos-Chondroit-Hyaluron-MSM (GLUCOSAMINE CHONDROITIN JOINT PO) Take 1 tablet by mouth daily.       L-Lysine 1000 MG TABS Take 1,000 mg by mouth daily.        Methotrexate, PF, (OTREXUP) 25 MG/0.4ML SOAJ INJECT 25 MG INTO THE SKIN EVERY 7 DAYS. 1.6 mL 2   rosuvastatin (CRESTOR) 10 MG tablet Take 10 mg by mouth daily.       Upadacitinib ER (RINVOQ) 15 MG TB24 Take 1 tablet (15 mg) by mouth daily. 30 tablet 2   valACYclovir (VALTREX) 1000 MG tablet TAKE 2 TABLETS BY MOUTH AS A SINGLE DOSE AT FIRST SIGN OF COLD SORE THEN REPEAT IN 12 HOURS 90 tablet 0      No current facility-administered medications for this visit.        Review of Systems  Constitutional:  Positive for weight loss. Negative for malaise/fatigue.  Respiratory:  Positive for shortness of breath.   Cardiovascular:  Positive for chest pain.  Gastrointestinal:  Positive for abdominal pain, heartburn and nausea.  Neurological: Negative.         PHYSICAL EXAMINATION: Vitals:   01/23/23 1252  BP: 134/85  Pulse: 88  Resp: 20  SpO2: 95%    Physical Exam Constitutional:      General: She is not in acute distress.    Appearance: Normal appearance. She is not ill-appearing.  HENT:     Head: Normocephalic and atraumatic.  Eyes:     Extraocular Movements: Extraocular movements intact.  Cardiovascular:     Rate and Rhythm: Normal rate.  Pulmonary:     Effort: No respiratory distress.  Abdominal:     General: There is  no distension.  Musculoskeletal:        General: Normal range of motion.     Cervical back: Normal range of motion.  Skin:    General: Skin is warm and dry.  Neurological:     General: No focal deficit present.     Mental Status: She is alert and oriented to person, place, and time.        Diagnostic Studies & Laboratory data:    CT Scan:   EGD: 7cm hiatal hernia Manometry: esophageal dysmotility Path: negative for malignancy         I have independently  reviewed the above radiology studies  and reviewed the findings with the patient.    Recent Lab Findings: Recent Labs       Lab Results  Component Value Date    WBC 7.9 03/05/2022    HGB 12.8 03/05/2022    HCT 37.8 03/05/2022    PLT 318 03/05/2022    GLUCOSE 101 (H) 03/05/2022    CHOL 220 (H) 04/01/2021    TRIG 118.0 04/01/2021    HDL 52.10 04/01/2021    LDLDIRECT 123.0 03/17/2019    LDLCALC 144 (H) 04/01/2021    ALT 23 03/05/2022    AST 22 03/05/2022    NA 140 03/05/2022    K 4.3 03/05/2022    CL 104 03/05/2022    CREATININE 0.63 03/05/2022    BUN 18 03/05/2022    CO2 28 03/05/2022    TSH 2.28 04/01/2021    HGBA1C 5.3 02/22/2009            Assessment / Plan:   56 year old female with moderate size hiatal hernia and significant symptoms.  She is also obese with a BMI of 43.9.  We discussed the risks and benefits of an EGD, robotic assisted laparoscopy, paraesophageal hernia repair with loose fundoplication given her history of esophageal dysmotility.  I explained to her that her recurrence rate is higher given her current weight.  Additionally she has a history of rheumatoid arthritis, and is on 2 immunosuppressants that will need to be stopped 1 weeks prior to surgery.  She is agreeable to proceed.

## 2023-02-18 ENCOUNTER — Other Ambulatory Visit (HOSPITAL_COMMUNITY): Payer: Self-pay

## 2023-02-20 ENCOUNTER — Encounter: Payer: Self-pay | Admitting: Rheumatology

## 2023-02-25 NOTE — Progress Notes (Signed)
Surgical Instructions   Your procedure is scheduled on Monday March 02, 2023. Report to [facility] Main Entrance "A" at 5:30 A.M., then check in with the Admitting office. Any questions or running late day of surgery: call [phone]  Questions prior to your surgery date: call [phone], Monday-Friday, 8am-4pm. If you experience any cold or flu symptoms such as cough, fever, chills, shortness of breath, etc. between now and your scheduled surgery, please notify us at the above number.     Remember:  Do not eat or drink after midnight the night before your surgery  Take these medicines the morning of surgery with A SIP OF WATER  esomeprazole (NEXIUM)    May take these medicines IF NEEDED:  Follow your surgeon's instructions on when to stop Asprin.  If no instructions were given by your surgeon then you will need to call the office to get those instructions.   Per your surgeon's instructions, STOP your methotrexate (RHEUMATREX) one week prior to surgery, with the last day being 02/22/2023.    Per your surgeon's instructions, STOP your Methotrexate, PF, (RASUVO) 25 MG/0.5ML SOAJ, one week prior to surgery, with the last dose being 02/22/2023.      Per your surgeon's instructions, STOP your RINVOQ one week prior to surgery, with the last day being 02/22/2023.    One week prior to surgery, STOP taking any Aleve, Naproxen, Ibuprofen, Motrin, Advil, Goody's, BC's, all herbal medications, fish oil, and non-prescription vitamins.                     Do NOT Smoke (Tobacco/Vaping) for 24 hours prior to your procedure.  If you use a CPAP at night, you may bring your mask/headgear for your overnight stay.   You will be asked to remove any contacts, glasses, piercing's, hearing aid's, dentures/partials prior to surgery. Please bring cases for these items if needed.    Patients discharged the day of surgery will not be allowed to drive home, and someone needs to stay with them for 24  hours.  SURGICAL WAITING ROOM VISITATION Patients may have no more than 2 support people in the waiting area - these visitors may rotate.   Pre-op nurse will coordinate an appropriate time for 1 ADULT support person, who may not rotate, to accompany patient in pre-op.  Children under the age of 20 must have an adult with them who is not the patient and must remain in the main waiting area with an adult.  If the patient needs to stay at the hospital during part of their recovery, the visitor guidelines for inpatient rooms apply.  Please refer to the [facility] website for the visitor guidelines for any additional information.   If you received a COVID test during your pre-op visit  it is requested that you wear a mask when out in public, stay away from anyone that may not be feeling well and notify your surgeon if you develop symptoms. If you have been in contact with anyone that has tested positive in the last 10 days please notify you surgeon.      Pre-operative CHG Bathing Instructions   You can play a key role in reducing the risk of infection after surgery. Your skin needs to be as free of germs as possible. You can reduce the number of germs on your skin by washing with CHG (chlorhexidine gluconate) soap before surgery. CHG is an antiseptic soap that kills germs and continues to kill germs even after washing.  DO NOT use if you have an allergy to chlorhexidine/CHG or antibacterial soaps. If your skin becomes reddened or irritated, stop using the CHG and notify one of our RNs at [phone].              TAKE A SHOWER THE NIGHT BEFORE SURGERY AND THE DAY OF SURGERY    Please keep in mind the following:  DO NOT shave, including legs and underarms, 48 hours prior to surgery.   You may shave your face before/day of surgery.  Place clean sheets on your bed the night before surgery Use a clean washcloth (not used since being washed) for each shower. DO NOT sleep with pet's night  before surgery.  CHG Shower Instructions:  Wash your face and private area with normal soap. If you choose to wash your hair, wash first with your normal shampoo.  After you use shampoo/soap, rinse your hair and body thoroughly to remove shampoo/soap residue.  Turn the water OFF and apply half the bottle of CHG soap to a CLEAN washcloth.  Apply CHG soap ONLY FROM YOUR NECK DOWN TO YOUR TOES (washing for 3-5 minutes)  DO NOT use CHG soap on face, private areas, open wounds, or sores.  Pay special attention to the area where your surgery is being performed.  If you are having back surgery, having someone wash your back for you may be helpful. Wait 2 minutes after CHG soap is applied, then you may rinse off the CHG soap.  Pat dry with a clean towel  Put on clean pajamas    Additional instructions for the day of surgery: DO NOT APPLY any lotions, deodorants or perfumes.   Do not wear jewelry or makeup Do not wear nail polish, gel polish, artificial nails, or any other type of covering on natural nails (fingers and toes) Do not bring valuables to the hospital. [facility] is not responsible for valuables/personal belongings. Put on clean/comfortable clothes.  Please brush your teeth.  Ask your nurse before applying any prescription medications to the skin.

## 2023-02-26 ENCOUNTER — Ambulatory Visit (HOSPITAL_COMMUNITY)
Admission: RE | Admit: 2023-02-26 | Discharge: 2023-02-26 | Disposition: A | Discharge status: Home / Self Care | Payer: 59 | Source: Ambulatory Visit | Attending: Thoracic Surgery (Cardiothoracic Vascular Surgery) | Admitting: Thoracic Surgery (Cardiothoracic Vascular Surgery)

## 2023-02-26 ENCOUNTER — Encounter (HOSPITAL_COMMUNITY)
Admission: RE | Admit: 2023-02-26 | Discharge: 2023-02-26 | Disposition: A | Discharge status: Home / Self Care | Payer: 59 | Source: Ambulatory Visit | Attending: Thoracic Surgery (Cardiothoracic Vascular Surgery) | Admitting: Thoracic Surgery (Cardiothoracic Vascular Surgery)

## 2023-02-26 ENCOUNTER — Other Ambulatory Visit: Payer: Self-pay

## 2023-02-26 ENCOUNTER — Encounter (HOSPITAL_COMMUNITY): Payer: Self-pay

## 2023-02-26 VITALS — BP 118/82 | HR 103 | Temp 97.9°F | Resp 18 | Ht 62.0 in | Wt 237.0 lb

## 2023-02-26 DIAGNOSIS — Z1152 Encounter for screening for COVID-19: Secondary | ICD-10-CM | POA: Insufficient documentation

## 2023-02-26 DIAGNOSIS — Z01818 Encounter for other preprocedural examination: Secondary | ICD-10-CM | POA: Insufficient documentation

## 2023-02-26 DIAGNOSIS — K449 Diaphragmatic hernia without obstruction or gangrene: Secondary | ICD-10-CM | POA: Diagnosis not present

## 2023-02-26 DIAGNOSIS — Z8679 Personal history of other diseases of the circulatory system: Secondary | ICD-10-CM | POA: Insufficient documentation

## 2023-02-26 LAB — CBC
HCT: 38.3 % (ref 36.0–46.0)
Hemoglobin: 12.7 g/dL (ref 12.0–15.0)
MCH: 30.8 pg (ref 26.0–34.0)
MCHC: 33.2 g/dL (ref 30.0–36.0)
MCV: 92.7 fL (ref 80.0–100.0)
Platelets: 283 10*3/uL (ref 150–400)
RBC: 4.13 MIL/uL (ref 3.87–5.11)
RDW: 17.1 % — ABNORMAL HIGH (ref 11.5–15.5)
WBC: 11.5 10*3/uL — ABNORMAL HIGH (ref 4.0–10.5)
nRBC: 0 % (ref 0.0–0.2)

## 2023-02-26 LAB — URINALYSIS, ROUTINE W REFLEX MICROSCOPIC
Bilirubin Urine: NEGATIVE
Glucose, UA: NEGATIVE mg/dL
Ketones, ur: NEGATIVE mg/dL
Leukocytes,Ua: NEGATIVE
Nitrite: NEGATIVE
Protein, ur: NEGATIVE mg/dL
Specific Gravity, Urine: 1.015 (ref 1.005–1.030)
pH: 6 (ref 5.0–8.0)

## 2023-02-26 LAB — URINALYSIS, MICROSCOPIC (REFLEX)

## 2023-02-26 LAB — COMPREHENSIVE METABOLIC PANEL WITH GFR
ALT: 27 U/L (ref 0–44)
AST: 28 U/L (ref 15–41)
Albumin: 3.5 g/dL (ref 3.5–5.0)
Alkaline Phosphatase: 64 U/L (ref 38–126)
Anion gap: 11 (ref 5–15)
BUN: 13 mg/dL (ref 6–20)
CO2: 26 mmol/L (ref 22–32)
Calcium: 9.5 mg/dL (ref 8.9–10.3)
Chloride: 102 mmol/L (ref 98–111)
Creatinine, Ser: 0.67 mg/dL (ref 0.44–1.00)
GFR, Estimated: >60 mL/min
Glucose, Bld: 100 mg/dL — ABNORMAL HIGH (ref 70–99)
Potassium: 3.6 mmol/L (ref 3.5–5.1)
Sodium: 139 mmol/L (ref 135–145)
Total Bilirubin: 0.4 mg/dL (ref 0.3–1.2)
Total Protein: 7.6 g/dL (ref 6.5–8.1)

## 2023-02-26 LAB — PROTIME-INR
INR: 1 (ref 0.8–1.2)
Prothrombin Time: 13.3 s (ref 11.4–15.2)

## 2023-02-26 LAB — TYPE AND SCREEN
ABO/RH(D): A POS
Antibody Screen: NEGATIVE

## 2023-02-26 LAB — APTT: aPTT: 33 s (ref 24–36)

## 2023-02-26 NOTE — Progress Notes (Signed)
PCP -  laura wiles Cardiologist - denies  PPM/ICD -  Device Orders -  Rep Notified -   Chest x-ray - 02/26/23 EKG - 02/26/23 Stress Test - denies ECHO - 2013 Cardiac Cath - denies  Sleep Study - denies  Per your surgeon's instructions, STOP your methotrexate (RHEUMATREX) one week prior to surgery, with the last day being 02/22/2023.     Per your surgeon's instructions, STOP your Methotrexate, PF, (RASUVO) 25 MG/0.5ML SOAJ, one week prior to surgery, with the last dose being 02/22/2023.       Per your surgeon's instructions, STOP your RINVOQ one week prior to surgery, with the last day being 02/22/2023.   ERAS Protcol -no   COVID TEST- 02/26/23   Anesthesia review: yes, heart murmur with no cardiologist. Family history of difficulty waking up after surgery   Patient denies shortness of breath, fever, cough and chest pain at PAT appointment   All instructions explained to the patient, with a verbal understanding of the material. Patient agrees to go over the instructions while at home for a better understanding. Patient also instructed to self quarantine after being tested for COVID-19. The opportunity to ask questions was provided.

## 2023-02-27 LAB — SARS CORONAVIRUS 2 (TAT 6-24 HRS): SARS Coronavirus 2: NEGATIVE

## 2023-02-27 NOTE — Progress Notes (Signed)
Lab did not receive surgical PCR sample on 02/26/23. PCR will need to be drawn DOS. Order modified.

## 2023-03-02 ENCOUNTER — Other Ambulatory Visit: Payer: Self-pay

## 2023-03-02 ENCOUNTER — Ambulatory Visit (HOSPITAL_COMMUNITY): Payer: 59 | Admitting: Certified Registered Nurse Anesthetist

## 2023-03-02 ENCOUNTER — Encounter (HOSPITAL_COMMUNITY)
Admission: RE | Disposition: A | Discharge status: Home / Self Care | Payer: Self-pay | Source: Home / Self Care | Attending: Thoracic Surgery (Cardiothoracic Vascular Surgery)

## 2023-03-02 ENCOUNTER — Ambulatory Visit (HOSPITAL_BASED_OUTPATIENT_CLINIC_OR_DEPARTMENT_OTHER): Payer: 59 | Admitting: Certified Registered Nurse Anesthetist

## 2023-03-02 ENCOUNTER — Encounter (HOSPITAL_COMMUNITY): Payer: Self-pay | Admitting: Thoracic Surgery (Cardiothoracic Vascular Surgery)

## 2023-03-02 ENCOUNTER — Observation Stay (HOSPITAL_COMMUNITY)
Admission: RE | Admit: 2023-03-02 | Discharge: 2023-03-03 | Disposition: A | Discharge status: Home / Self Care | Payer: 59 | Attending: Thoracic Surgery (Cardiothoracic Vascular Surgery) | Admitting: Thoracic Surgery (Cardiothoracic Vascular Surgery)

## 2023-03-02 DIAGNOSIS — K449 Diaphragmatic hernia without obstruction or gangrene: Secondary | ICD-10-CM | POA: Diagnosis present

## 2023-03-02 DIAGNOSIS — K440 Diaphragmatic hernia with obstruction, without gangrene: Secondary | ICD-10-CM | POA: Diagnosis not present

## 2023-03-02 DIAGNOSIS — Z8719 Personal history of other diseases of the digestive system: Secondary | ICD-10-CM

## 2023-03-02 DIAGNOSIS — Z9889 Other specified postprocedural states: Secondary | ICD-10-CM

## 2023-03-02 DIAGNOSIS — Z96652 Presence of left artificial knee joint: Secondary | ICD-10-CM | POA: Insufficient documentation

## 2023-03-02 DIAGNOSIS — Z01818 Encounter for other preprocedural examination: Principal | ICD-10-CM

## 2023-03-02 HISTORY — PX: ESOPHAGOGASTRODUODENOSCOPY: SHX5428

## 2023-03-02 HISTORY — PX: XI ROBOTIC ASSISTED PARAESOPHAGEAL HERNIA REPAIR: SHX6871

## 2023-03-02 LAB — ABO/RH: ABO/RH(D): A POS

## 2023-03-02 LAB — SURGICAL PCR SCREEN
MRSA, PCR: NEGATIVE
Staphylococcus aureus: POSITIVE — AB

## 2023-03-02 SURGERY — REPAIR, HERNIA, PARAESOPHAGEAL, ROBOT-ASSISTED
Anesthesia: General | Site: Chest

## 2023-03-02 MED ORDER — CHLORHEXIDINE GLUCONATE 0.12 % MT SOLN
15.0000 mL | Freq: Once | OROMUCOSAL | Status: AC
  Administered 2023-03-02: 15 mL via OROMUCOSAL
  Filled 2023-03-02: qty 15

## 2023-03-02 MED ORDER — BUPIVACAINE LIPOSOME 1.3 % IJ SUSP
INTRAMUSCULAR | Status: DC | PRN
  Administered 2023-03-02: 50 mL

## 2023-03-02 MED ORDER — ACETAMINOPHEN 10 MG/ML IV SOLN
1000.0000 mg | Freq: Four times a day (QID) | INTRAVENOUS | Status: AC
  Administered 2023-03-02 – 2023-03-03 (×4): 1000 mg via INTRAVENOUS
  Filled 2023-03-02 (×4): qty 100

## 2023-03-02 MED ORDER — ENOXAPARIN SODIUM 40 MG/0.4ML IJ SOSY
40.0000 mg | PREFILLED_SYRINGE | INTRAMUSCULAR | Status: DC
  Administered 2023-03-03: 40 mg via SUBCUTANEOUS
  Filled 2023-03-02: qty 0.4

## 2023-03-02 MED ORDER — FENTANYL CITRATE (PF) 250 MCG/5ML IJ SOLN
INTRAMUSCULAR | Status: AC
  Filled 2023-03-02: qty 5

## 2023-03-02 MED ORDER — KETOROLAC TROMETHAMINE 15 MG/ML IJ SOLN: 15.0000 mg | Freq: Four times a day (QID) | INTRAMUSCULAR | Status: DC | PRN

## 2023-03-02 MED ORDER — PROPOFOL 10 MG/ML IV BOLUS
INTRAVENOUS | Status: AC
  Filled 2023-03-02: qty 20

## 2023-03-02 MED ORDER — BUPIVACAINE LIPOSOME 1.3 % IJ SUSP
INTRAMUSCULAR | Status: AC
  Filled 2023-03-02: qty 20

## 2023-03-02 MED ORDER — ONDANSETRON HCL 4 MG/2ML IJ SOLN: 4.0000 mg | Freq: Four times a day (QID) | INTRAMUSCULAR | Status: DC | PRN

## 2023-03-02 MED ORDER — CHLORHEXIDINE GLUCONATE CLOTH 2 % EX PADS: 6.0000 | MEDICATED_PAD | Freq: Every day | CUTANEOUS | Status: DC

## 2023-03-02 MED ORDER — 0.9 % SODIUM CHLORIDE (POUR BTL) OPTIME
TOPICAL | Status: DC | PRN
  Administered 2023-03-02: 2000 mL

## 2023-03-02 MED ORDER — PHENYLEPHRINE HCL-NACL 20-0.9 MG/250ML-% IV SOLN
INTRAVENOUS | Status: DC | PRN
  Administered 2023-03-02: 50 ug/min via INTRAVENOUS

## 2023-03-02 MED ORDER — VANCOMYCIN HCL 1500 MG/300ML IV SOLN
1500.0000 mg | INTRAVENOUS | Status: AC
  Administered 2023-03-02: 1500 mg via INTRAVENOUS
  Filled 2023-03-02: qty 300

## 2023-03-02 MED ORDER — MIDAZOLAM HCL 2 MG/2ML IJ SOLN
INTRAMUSCULAR | Status: DC | PRN
  Administered 2023-03-02 (×2): 1 mg via INTRAVENOUS

## 2023-03-02 MED ORDER — CEFAZOLIN SODIUM-DEXTROSE 2-4 GM/100ML-% IV SOLN
2.0000 g | Freq: Three times a day (TID) | INTRAVENOUS | Status: AC
  Administered 2023-03-02: 2 g via INTRAVENOUS
  Filled 2023-03-02: qty 100

## 2023-03-02 MED ORDER — PROPOFOL 10 MG/ML IV BOLUS
INTRAVENOUS | Status: DC | PRN
  Administered 2023-03-02: 150 mg via INTRAVENOUS

## 2023-03-02 MED ORDER — ONDANSETRON HCL 4 MG/2ML IJ SOLN
INTRAMUSCULAR | Status: DC | PRN
  Administered 2023-03-02: 4 mg via INTRAVENOUS

## 2023-03-02 MED ORDER — ROCURONIUM BROMIDE 10 MG/ML (PF) SYRINGE
PREFILLED_SYRINGE | INTRAVENOUS | Status: DC | PRN
  Administered 2023-03-02: 50 mg via INTRAVENOUS
  Administered 2023-03-02: 25 mg via INTRAVENOUS
  Administered 2023-03-02: 30 mg via INTRAVENOUS
  Administered 2023-03-02: 20 mg via INTRAVENOUS

## 2023-03-02 MED ORDER — FENTANYL CITRATE (PF) 250 MCG/5ML IJ SOLN
INTRAMUSCULAR | Status: DC | PRN
  Administered 2023-03-02: 50 ug via INTRAVENOUS
  Administered 2023-03-02: 75 ug via INTRAVENOUS
  Administered 2023-03-02: 25 ug via INTRAVENOUS

## 2023-03-02 MED ORDER — ONDANSETRON HCL 4 MG/2ML IJ SOLN
4.0000 mg | Freq: Four times a day (QID) | INTRAMUSCULAR | Status: DC
  Administered 2023-03-03 (×3): 4 mg via INTRAVENOUS
  Filled 2023-03-02 (×3): qty 2

## 2023-03-02 MED ORDER — SUGAMMADEX SODIUM 200 MG/2ML IV SOLN
INTRAVENOUS | Status: DC | PRN
  Administered 2023-03-02: 213.2 mg via INTRAVENOUS

## 2023-03-02 MED ORDER — MORPHINE SULFATE (PF) 2 MG/ML IV SOLN: 2.0000 mg | INTRAVENOUS | Status: DC | PRN

## 2023-03-02 MED ORDER — LIDOCAINE 2% (20 MG/ML) 5 ML SYRINGE
INTRAMUSCULAR | Status: DC | PRN
  Administered 2023-03-02: 60 mg via INTRAVENOUS

## 2023-03-02 MED ORDER — MUPIROCIN 2 % EX OINT
1.0000 | TOPICAL_OINTMENT | Freq: Two times a day (BID) | CUTANEOUS | Status: DC
  Administered 2023-03-02 – 2023-03-03 (×2): 1 via NASAL
  Filled 2023-03-02: qty 22

## 2023-03-02 MED ORDER — ORAL CARE MOUTH RINSE: 15.0000 mL | OROMUCOSAL | Status: DC | PRN

## 2023-03-02 MED ORDER — FENTANYL CITRATE (PF) 100 MCG/2ML IJ SOLN
25.0000 ug | INTRAMUSCULAR | Status: DC | PRN
  Administered 2023-03-02: 50 ug via INTRAVENOUS

## 2023-03-02 MED ORDER — OXYCODONE HCL 5 MG PO TABS: 5.0000 mg | ORAL_TABLET | Freq: Once | ORAL | Status: DC | PRN

## 2023-03-02 MED ORDER — HYDRALAZINE HCL 20 MG/ML IJ SOLN: 10.0000 mg | INTRAMUSCULAR | Status: DC | PRN

## 2023-03-02 MED ORDER — LACTATED RINGERS IV SOLN: INTRAVENOUS | Status: DC

## 2023-03-02 MED ORDER — OXYCODONE HCL 5 MG/5ML PO SOLN: 5.0000 mg | Freq: Once | ORAL | Status: DC | PRN

## 2023-03-02 MED ORDER — PANTOPRAZOLE SODIUM 40 MG PO TBEC
40.0000 mg | DELAYED_RELEASE_TABLET | Freq: Two times a day (BID) | ORAL | Status: DC
  Filled 2023-03-02: qty 1

## 2023-03-02 MED ORDER — DEXAMETHASONE SODIUM PHOSPHATE 10 MG/ML IJ SOLN
INTRAMUSCULAR | Status: DC | PRN
  Administered 2023-03-02: 10 mg via INTRAVENOUS

## 2023-03-02 MED ORDER — FENTANYL CITRATE (PF) 100 MCG/2ML IJ SOLN
INTRAMUSCULAR | Status: AC
  Filled 2023-03-02: qty 2

## 2023-03-02 MED ORDER — OYSTER SHELL CALCIUM/D3 500-5 MG-MCG PO TABS
1.0000 | ORAL_TABLET | Freq: Every evening | ORAL | Status: DC
  Filled 2023-03-02: qty 1

## 2023-03-02 MED ORDER — PHENYLEPHRINE 80 MCG/ML (10ML) SYRINGE FOR IV PUSH (FOR BLOOD PRESSURE SUPPORT)
PREFILLED_SYRINGE | INTRAVENOUS | Status: DC | PRN
  Administered 2023-03-02 (×3): 80 ug via INTRAVENOUS

## 2023-03-02 MED ORDER — BUPIVACAINE HCL (PF) 0.5 % IJ SOLN
INTRAMUSCULAR | Status: AC
  Filled 2023-03-02: qty 30

## 2023-03-02 MED ORDER — ORAL CARE MOUTH RINSE: 15.0000 mL | Freq: Once | OROMUCOSAL | Status: AC

## 2023-03-02 MED ORDER — MIDAZOLAM HCL 2 MG/2ML IJ SOLN
INTRAMUSCULAR | Status: AC
  Filled 2023-03-02: qty 2

## 2023-03-02 SURGICAL SUPPLY — 75 items
ADH SKN CLS APL DERMABOND .7 (GAUZE/BANDAGES/DRESSINGS) ×2
BLADE SURG 11 STRL SS (BLADE) ×3 IMPLANT
BUTTON OLYMPUS DEFENDO 5 PIECE (MISCELLANEOUS) ×3 IMPLANT
CANISTER SUCT 3000ML PPV (MISCELLANEOUS) ×6 IMPLANT
CNTNR URN SCR LID CUP LEK RST (MISCELLANEOUS) ×3 IMPLANT
CONT SPEC 4OZ STRL OR WHT (MISCELLANEOUS) ×2
DEFOGGER SCOPE WARMER CLEARIFY (MISCELLANEOUS) ×3 IMPLANT
DERMABOND ADVANCED .7 DNX12 (GAUZE/BANDAGES/DRESSINGS) ×3 IMPLANT
DEVICE SUTURE ENDOST 10MM (ENDOMECHANICALS) IMPLANT
DRAPE ARM DVNC X/XI (DISPOSABLE) ×12 IMPLANT
DRAPE COLUMN DVNC XI (DISPOSABLE) ×3 IMPLANT
DRAPE CV SPLIT W-CLR ANES SCRN (DRAPES) ×3 IMPLANT
DRAPE IMP U-DRAPE 54X76 (DRAPES) IMPLANT
DRAPE INCISE IOBAN 66X45 STRL (DRAPES) IMPLANT
DRAPE ORTHO SPLIT 77X108 STRL (DRAPES) ×2
DRAPE SURG ORHT 6 SPLT 77X108 (DRAPES) ×3 IMPLANT
DRIVER NDL LRG 8 DVNC XI (INSTRUMENTS) IMPLANT
DRIVER NDL MEGA SUTCUT DVNCXI (INSTRUMENTS) IMPLANT
DRIVER NDLE LRG 8 DVNC XI (INSTRUMENTS) IMPLANT
DRIVER NDLE MEGA SUTCUT DVNCXI (INSTRUMENTS) IMPLANT
ELECT REM PT RETURN 9FT ADLT (ELECTROSURGICAL) ×2
ELECTRODE REM PT RTRN 9FT ADLT (ELECTROSURGICAL) ×3 IMPLANT
FELT TEFLON 1X6 (MISCELLANEOUS) IMPLANT
FORCEPS BPLR LNG DVNC XI (INSTRUMENTS) IMPLANT
FORCEPS CADIERE DVNC XI (FORCEP) IMPLANT
GAUZE SPONGE 4X4 12PLY STRL (GAUZE/BANDAGES/DRESSINGS) ×3 IMPLANT
GLOVE BIO SURGEON STRL SZ7 (GLOVE) ×3 IMPLANT
GLOVE BIO SURGEON STRL SZ7.5 (GLOVE) ×9 IMPLANT
GOWN STRL REUS W/ TWL LRG LVL3 (GOWN DISPOSABLE) ×3 IMPLANT
GOWN STRL REUS W/ TWL XL LVL3 (GOWN DISPOSABLE) ×6 IMPLANT
GOWN STRL REUS W/TWL 2XL LVL3 (GOWN DISPOSABLE) ×3 IMPLANT
GOWN STRL REUS W/TWL LRG LVL3 (GOWN DISPOSABLE) ×2
GOWN STRL REUS W/TWL XL LVL3 (GOWN DISPOSABLE) ×4
GRASPER SUT TROCAR 14GX15 (MISCELLANEOUS) IMPLANT
GRASPER TIP-UP FEN DVNC XI (INSTRUMENTS) IMPLANT
HEMOSTAT SURGICEL 2X14 (HEMOSTASIS) ×3 IMPLANT
IV NS 1000ML (IV SOLUTION)
IV NS 1000ML BAXH (IV SOLUTION) IMPLANT
KIT BASIN OR (CUSTOM PROCEDURE TRAY) ×3 IMPLANT
KIT TURNOVER KIT B (KITS) ×3 IMPLANT
MARKER SKIN DUAL TIP RULER LAB (MISCELLANEOUS) ×3 IMPLANT
MESH OVITEX 1S RESORB 6X10 6L (Mesh General) IMPLANT
NDL HYPO 22X1.5 SAFETY MO (MISCELLANEOUS) ×3 IMPLANT
NEEDLE HYPO 22X1.5 SAFETY MO (MISCELLANEOUS) ×2 IMPLANT
NS IRRIG 1000ML POUR BTL (IV SOLUTION) ×6 IMPLANT
OIL SILICONE PENTAX (PARTS (SERVICE/REPAIRS)) IMPLANT
PACK CHEST (CUSTOM PROCEDURE TRAY) ×3 IMPLANT
PAD ARMBOARD 7.5X6 YLW CONV (MISCELLANEOUS) ×6 IMPLANT
PORT ACCESS TROCAR AIRSEAL 12 (TROCAR) IMPLANT
SEAL UNIV 5-12 XI (MISCELLANEOUS) ×12 IMPLANT
SEALER SYNCHRO 8 IS4000 DVNC (MISCELLANEOUS) IMPLANT
SET TRI-LUMEN FLTR TB AIRSEAL (TUBING) ×3 IMPLANT
SOL ELECTROSURG ANTI STICK (MISCELLANEOUS) ×2
SOLUTION ELECTROSURG ANTI STCK (MISCELLANEOUS) IMPLANT
SUT ETHIBOND 0 36 GRN (SUTURE) ×6 IMPLANT
SUT SILK 1 MH (SUTURE) ×3 IMPLANT
SUT SURGIDAC NAB ES-9 0 48 120 (SUTURE) IMPLANT
SUT VIC AB 2-0 CT1 18 (SUTURE) ×3 IMPLANT
SUT VIC AB 3-0 SH 27 (SUTURE) ×4
SUT VIC AB 3-0 SH 27X BRD (SUTURE) ×6 IMPLANT
SUT VICRYL 0 UR6 27IN ABS (SUTURE) ×6 IMPLANT
SYR 20ML ECCENTRIC (SYRINGE) ×3 IMPLANT
SYR 30ML LL (SYRINGE) IMPLANT
SYSTEM SAHARA CHEST DRAIN ATS (WOUND CARE) IMPLANT
TOWEL GREEN STERILE (TOWEL DISPOSABLE) ×3 IMPLANT
TOWEL GREEN STERILE FF (TOWEL DISPOSABLE) ×3 IMPLANT
TRAY FOLEY MTR SLVR 16FR STAT (SET/KITS/TRAYS/PACK) ×3 IMPLANT
TROCAR PORT AIRSEAL 8X120 (TROCAR) IMPLANT
TROCAR XCEL BLADELESS 5X75MML (TROCAR) ×3 IMPLANT
TROCAR XCEL NON-BLD 5MMX100MML (ENDOMECHANICALS) IMPLANT
TROCAR Z-THREAD OPTICAL 5X100M (TROCAR) IMPLANT
TUBE CONNECTING 20X1/4 (TUBING) ×3 IMPLANT
TUBING ENDO SMARTCAP (MISCELLANEOUS) ×3 IMPLANT
UNDERPAD 30X36 HEAVY ABSORB (UNDERPADS AND DIAPERS) ×3 IMPLANT
WATER STERILE IRR 1000ML POUR (IV SOLUTION) ×3 IMPLANT

## 2023-03-02 NOTE — Transfer of Care (Signed)
Immediate Anesthesia Transfer of Care Note  Patient: Donna Lopez  Procedure(s) Performed: XI ROBOTIC ASSISTED PARAESOPHAGEAL HERNIA REPAIR (Chest) ESOPHAGOGASTRODUODENOSCOPY (EGD)  Patient Location: PACU  Anesthesia Type:General  Level of Consciousness: awake, alert , and oriented  Airway & Oxygen Therapy: Patient Spontanous Breathing and Patient connected to nasal cannula oxygen  Post-op Assessment: Report given to RN and Post -op Vital signs reviewed and stable  Post vital signs: Reviewed and stable  Last Vitals:  Vitals Value Taken Time  BP 98/77 03/02/23 1045  Temp    Pulse 87 03/02/23 1045  Resp 14 03/02/23 1045  SpO2 97 % 03/02/23 1045    Last Pain:  Vitals:   03/02/23 0611  TempSrc: Oral  PainSc:       Patients Stated Pain Goal: 0 (03/02/23 0604)  Complications: No notable events documented.

## 2023-03-02 NOTE — Op Note (Signed)
[address]       [place] [address]             [phone]        03/02/2023  Patient:  Donna Lopez Pre-Op Dx: paraesophageal hernia    Post-op Dx:  same Procedure: - Esophagoscopy - Robotic assisted laparoscopy - Paraesophageal hernia repair with OviTex1S resorbable mesh pledgets - Toupet Fundoplication   Surgeon and Role:      * Aaliyah Gavel, Eliezer Lofts, MD - Primary  Assistant: Jaclyn Prime, PA-C  An experienced assistant was required given the complexity of this surgery and the standard of surgical care. The assistant was needed for exposure, dissection, suctioning, retraction of delicate tissues and sutures, instrument exchange and for overall help during this procedure.   Anesthesia  general EBL:  50ml Blood Administration: none Specimen:  none   Counts: correct   Indications: 57 year old female with moderate size hiatal hernia and significant symptoms. She is also obese with a BMI of 43.9. We discussed the risks and benefits of an EGD, robotic assisted laparoscopy, paraesophageal hernia repair with loose fundoplication given her history of esophageal dysmotility. I explained to her that her recurrence rate is higher given her current weight. Additionally she has a history of rheumatoid arthritis, and is on 2 immunosuppressants that will need to be stopped 1 weeks prior to surgery. She is agreeable to proceed.   Findings: Large hernia with stomach.  5 stitches to repair the hiatus.  Operative Technique: After the risks, benefits and alternatives were thoroughly discussed, the patient was brought to the operative theatre.  Anesthesia was induced, and the esophagoscope was passed through the oropharynx down to the stomach.  The scope was retroflexed and the hiatal hernia was clearly evident.  The scope was pulled back and the mucosal surface of the esophagus was visualized.    The scope was then parked at 25 cm from the incisors.  The patient was  then prepped and draped in normal sterile fashion.  An appropriate surgical pause was performed, and pre-operative antibiotics were dosed accordingly.  We began with a 1 cm incision 15 cm caudad from the xiphoid and slightly lateral to the umbilicus.  Using an Optiview we entered the peritoneal space.  The abdomen was then insufflated with CO2.  3 other robotic ports were placed to triangulate the hiatus.  Another 12 mm port was placed in place at the level of the umbilicus laterally for an assistant port and another 5 mm trocar was placed in the right lower quadrant for liver retractor.  The patient was then placed in steep reverse Trendelenburg and the liver was elevated to expose the esophageal hiatus.  And then the robot was docked.  We began by dividing the gastrohepatic ligament to expose the right diaphragmatic crus and then dissected the hernia sac in a clockwise fashion to mobilize there the stomach and esophagus.  We then divided the short gastrics and moved towards the right crus and completed our dissection along the esophageal hiatus.  A Penrose drain was then used to encircle the the esophagus and we continued our dissection up into the mediastinum.  Once we had achieved 3 to 4 cm of intra-abdominal esophagus we then proceeded to reapproximate the crura with 0 Ethibond sutures in an interrupted fashion.  The gastroscope was passed down through the lower esophageal sphincter into the stomach and would act as our bougie during this repair.  Next the stomach was passed  posterior to the esophagus and Toupet fundoplication was performed.  An air leak test was performed using the gastroscope.  No leak was evident.  The liver retractor was removed and all ports were removed under direct visualization.  The skin and soft tissue were closed with absorbable suture    The patient tolerated the procedure without any immediate complications, and was transferred to the PACU in stable condition.  Donna Lopez

## 2023-03-02 NOTE — Anesthesia Procedure Notes (Signed)
Procedure Name: Intubation Date/Time: 03/02/2023 7:49 AM  Performed by: Marena Chancy, CRNAPre-anesthesia Checklist: Patient identified, Emergency Drugs available, Suction available and Patient being monitored Patient Re-evaluated:Patient Re-evaluated prior to induction Oxygen Delivery Method: Circle System Utilized Preoxygenation: Pre-oxygenation with 100% oxygen Induction Type: IV induction Ventilation: Mask ventilation without difficulty Laryngoscope Size: Miller Grade View: Grade I Tube type: Oral Tube size: 7.5 mm Number of attempts: 1 Airway Equipment and Method: Stylet and Oral airway Placement Confirmation: ETT inserted through vocal cords under direct vision, positive ETCO2 and breath sounds checked- equal and bilateral Tube secured with: Tape Dental Injury: Teeth and Oropharynx as per pre-operative assessment

## 2023-03-02 NOTE — Brief Op Note (Signed)
03/02/2023  10:28 AM  PATIENT:  Donna Lopez  57 y.o. female  PRE-OPERATIVE DIAGNOSIS:  PARAESOPHAGEAL HERNIA  POST-OPERATIVE DIAGNOSIS:  PARAESOPHAGEAL HERNIA  PROCEDURE:  Procedure(s):  XI ROBOTIC ASSISTED PARAESOPHAGEAL HERNIA REPAIR (N/A) ESOPHAGOGASTRODUODENOSCOPY (EGD) (N/A)  SURGEON:  Surgeons and Role:    * Lightfoot, Eliezer Lofts, MD - Primary  PHYSICIAN ASSISTANT: Lowella Dandy PA-C  ASSISTANTS: none   ANESTHESIA:   general  EBL:  25 mL   BLOOD ADMINISTERED:none  DRAINS: none   LOCAL MEDICATIONS USED:  BUPIVICAINE   SPECIMEN:  No Specimen  DISPOSITION OF SPECIMEN:  N/A  COUNTS:  YES  TOURNIQUET:  * No tourniquets in log *  DICTATION: .Dragon Dictation  PLAN OF CARE: Admit for overnight observation  PATIENT DISPOSITION:  PACU - hemodynamically stable.   Delay start of Pharmacological VTE agent (>24hrs) due to surgical blood loss or risk of bleeding: no

## 2023-03-02 NOTE — Hospital Course (Signed)
History of Present Illness:  Donna Lopez is a 57 yo female with known history of GERD, morbid obesity, and known Hiatal Hernia.  This hernia has been present for 20 years, however she has recently been experiencing worsening reflux and dysphagia.  She also experiences worsening shortness of breath after eating.  She has lost 10 lbs which was intentional.  She was referred to Dr. Cliffton Asters who recommended EGD, Robotic assisted Laparoscopy with paraesophageal hernia repair with loose fundoplication.  The risks and benefits of the procedure were explained to the patient and she was agreeable to proceed.    Hospital Course:  [name] presents to [facility] on 03/02/2023.  She was taken to the operating room and underwent EGD, repair of paraesophageal hernia and fundoplication.  She tolerated the procedure, was extubated and taken to the PACU in stable condition.

## 2023-03-02 NOTE — Anesthesia Preprocedure Evaluation (Signed)
Anesthesia Evaluation  Patient identified by MRN, date of birth, ID band Patient awake    Reviewed: Allergy & Precautions, H&P , NPO status , Patient's Chart, lab work & pertinent test results  Airway Mallampati: II   Neck ROM: full    Dental   Pulmonary neg pulmonary ROS   breath sounds clear to auscultation       Cardiovascular negative cardio ROS  Rhythm:regular Rate:Normal     Neuro/Psych  Neuromuscular disease    GI/Hepatic hiatal hernia,GERD  ,,  Endo/Other    Morbid obesity  Renal/GU      Musculoskeletal  (+) Arthritis ,    Abdominal   Peds  Hematology   Anesthesia Other Findings   Reproductive/Obstetrics                             Anesthesia Physical Anesthesia Plan  ASA: 2  Anesthesia Plan: General   Post-op Pain Management:    Induction: Intravenous  PONV Risk Score and Plan: 3 and Ondansetron, Dexamethasone, Midazolam and Treatment may vary due to age or medical condition  Airway Management Planned: Oral ETT and Double Lumen EBT  Additional Equipment: Arterial line  Intra-op Plan:   Post-operative Plan: Extubation in OR  Informed Consent: I have reviewed the patients History and Physical, chart, labs and discussed the procedure including the risks, benefits and alternatives for the proposed anesthesia with the patient or authorized representative who has indicated his/her understanding and acceptance.     Dental advisory given  Plan Discussed with: CRNA, Anesthesiologist and Surgeon  Anesthesia Plan Comments:        Anesthesia Quick Evaluation

## 2023-03-02 NOTE — H&P (Signed)
[address]       [place] [address]             [phone]                                                   [name] [facility] Medical Record #[id number] Date of Birth: 1966/02/19   Referring: Napoleon Form, MD Primary Care: Melida Quitter, MD Primary Cardiologist: None   Chief Complaint:        Chief Complaint  Patient presents with   Hiatal Hernia      Chest CT 10/30, Endo 10/9, manometry 12/6      History of Present Illness:    Donna Lopez 57 y.o. female presents for surgical evaluation of a large hiatal hernia.  She states that she is known about this for over 20 years but recently she has had worsening reflux and dysphagia.  On occasion she also has some odynophagia.  She also states that after having the size meals she has worsening shortness of breath.  Her weight has been stable.  She has had an intentional 10 pound weight loss.   Her symptoms are unchanged since her last appointment.          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     Vitamin D deficiency                 Past Surgical History:  Procedure Laterality Date   COLONOSCOPY   2014    DB- normal    ESOPHAGEAL MANOMETRY N/A 04/23/2022    Procedure: ESOPHAGEAL MANOMETRY (EM);  Surgeon: Napoleon Form, MD;  Location: [facility];  Service: Gastroenterology;  Laterality: N/A;   KNEE ARTHROPLASTY       THORACENTESIS   04/2021   TOTAL KNEE  ARTHROPLASTY Left 12/17/2018    Procedure: LEFT TOTAL KNEE ARTHROPLASTY;  Surgeon: Kathryne Hitch, MD;  Location: [facility];  Service: Orthopedics;  Laterality: Left;   UPPER GASTROINTESTINAL ENDOSCOPY                   Family History  Problem Relation Age of Onset   Uterine cancer Mother     Diabetes Father     Kidney cancer Father     Colon cancer Sister 48   Rectal cancer Sister     Colon cancer Paternal Grandfather     Colon polyps Brother     Cancer Sister          breast    Healthy Daughter     Esophageal cancer Neg Hx  Stomach cancer Neg Hx              Tobacco Use History  Social History        Tobacco Use  Smoking Status Never   Passive exposure: Never  Smokeless Tobacco Never      Social History        Substance and Sexual Activity  Alcohol Use Yes    Comment: rare alcohol use-once/twice a year        Allergies       Allergies  Allergen Reactions   Penicillins Hives      Did it involve swelling of the face/tongue/throat, SOB, or low BP? No Did it involve sudden or severe rash/hives, skin peeling, or any reaction on the inside of your mouth or nose? No Did you need to seek medical attention at a hospital or doctor's office? Yes When did it last happen?      15 -20 years ago If all above answers are "NO", may proceed with cephalosporin use.                Current Outpatient Medications  Medication Sig Dispense Refill   aspirin 81 MG chewable tablet Chew 1 tablet (81 mg total) by mouth 2 (two) times daily. (Patient taking differently: Chew 81 mg by mouth daily.) 30 tablet 0   Calcium Carb-Cholecalciferol (CALCIUM + D3) 600-200 MG-UNIT TABS Take 1 capsule by mouth daily.       Cholecalciferol (VITAMIN D) 50 MCG (2000 UT) CAPS Take 2,000 Units by mouth daily.       esomeprazole (NEXIUM) 20 MG capsule Take 1 capsule (20 mg total) by mouth 2 (two) times daily before a meal. 60 capsule 1   folic acid (FOLVITE) 1 MG tablet TAKE 2 TABLETS BY  MOUTH EVERY DAY 180 tablet 3   Glucos-Chondroit-Hyaluron-MSM (GLUCOSAMINE CHONDROITIN JOINT PO) Take 1 tablet by mouth daily.       L-Lysine 1000 MG TABS Take 1,000 mg by mouth daily.        Methotrexate, PF, (OTREXUP) 25 MG/0.4ML SOAJ INJECT 25 MG INTO THE SKIN EVERY 7 DAYS. 1.6 mL 2   rosuvastatin (CRESTOR) 10 MG tablet Take 10 mg by mouth daily.       Upadacitinib ER (RINVOQ) 15 MG TB24 Take 1 tablet (15 mg) by mouth daily. 30 tablet 2   valACYclovir (VALTREX) 1000 MG tablet TAKE 2 TABLETS BY MOUTH AS A SINGLE DOSE AT FIRST SIGN OF COLD SORE THEN REPEAT IN 12 HOURS 90 tablet 0      No current facility-administered medications for this visit.        Review of Systems  Constitutional:  Positive for weight loss. Negative for malaise/fatigue.  Respiratory:  Positive for shortness of breath.   Cardiovascular:  Positive for chest pain.  Gastrointestinal:  Positive for abdominal pain, heartburn and nausea.  Neurological: Negative.         PHYSICAL EXAMINATION: Vitals:   01/23/23 1252  BP: 134/85  Pulse: 88  Resp: 20  SpO2: 95%    Physical Exam Constitutional:      General: She is not in acute distress.    Appearance: Normal appearance. She is not ill-appearing.  HENT:     Head: Normocephalic and atraumatic.  Eyes:     Extraocular Movements: Extraocular movements intact.  Cardiovascular:     Rate and Rhythm: Normal rate.  Pulmonary:     Effort: No respiratory distress.  Abdominal:     General: There is  no distension.  Musculoskeletal:        General: Normal range of motion.     Cervical back: Normal range of motion.  Skin:    General: Skin is warm and dry.  Neurological:     General: No focal deficit present.     Mental Status: She is alert and oriented to person, place, and time.        Diagnostic Studies & Laboratory data:    CT Scan:   EGD: 7cm hiatal hernia Manometry: esophageal dysmotility Path: negative for malignancy         I have independently  reviewed the above radiology studies  and reviewed the findings with the patient.    Recent Lab Findings: Recent Labs       Lab Results  Component Value Date    WBC 7.9 03/05/2022    HGB 12.8 03/05/2022    HCT 37.8 03/05/2022    PLT 318 03/05/2022    GLUCOSE 101 (H) 03/05/2022    CHOL 220 (H) 04/01/2021    TRIG 118.0 04/01/2021    HDL 52.10 04/01/2021    LDLDIRECT 123.0 03/17/2019    LDLCALC 144 (H) 04/01/2021    ALT 23 03/05/2022    AST 22 03/05/2022    NA 140 03/05/2022    K 4.3 03/05/2022    CL 104 03/05/2022    CREATININE 0.63 03/05/2022    BUN 18 03/05/2022    CO2 28 03/05/2022    TSH 2.28 04/01/2021    HGBA1C 5.3 02/22/2009            Assessment / Plan:   56 year old female with moderate size hiatal hernia and significant symptoms.  She is also obese with a BMI of 43.9.  We discussed the risks and benefits of an EGD, robotic assisted laparoscopy, paraesophageal hernia repair with loose fundoplication given her history of esophageal dysmotility.  I explained to her that her recurrence rate is higher given her current weight.  Additionally she has a history of rheumatoid arthritis, and is on 2 immunosuppressants that will need to be stopped 1 weeks prior to surgery.  She is agreeable to proceed.

## 2023-03-02 NOTE — Discharge Instructions (Addendum)
Diet:  Baby food consistency for 2 weeks.. do not advance diet until cleared by Dr. Cliffton Asters.   Crush all medications and take in applesauce  Activity- up as tolerated, ambulate at least 3 times per day  No Driving until off all narcotic pain medications and cleared by Dr. Lucilla Lame office  You may shower, please wash incisions daily with soap and water.. do not submerge incisions under water  If you have questions/concerns please do not hesitate to call our office at [phone]

## 2023-03-02 NOTE — Plan of Care (Signed)

## 2023-03-03 ENCOUNTER — Encounter (HOSPITAL_COMMUNITY): Payer: Self-pay | Admitting: Thoracic Surgery (Cardiothoracic Vascular Surgery)

## 2023-03-03 ENCOUNTER — Observation Stay (HOSPITAL_COMMUNITY): Payer: 59

## 2023-03-03 DIAGNOSIS — K449 Diaphragmatic hernia without obstruction or gangrene: Secondary | ICD-10-CM | POA: Diagnosis not present

## 2023-03-03 LAB — CBC
HCT: 36.2 % (ref 36.0–46.0)
Hemoglobin: 11.8 g/dL — ABNORMAL LOW (ref 12.0–15.0)
MCH: 29.6 pg (ref 26.0–34.0)
MCHC: 32.6 g/dL (ref 30.0–36.0)
MCV: 90.7 fL (ref 80.0–100.0)
Platelets: 265 10*3/uL (ref 150–400)
RBC: 3.99 MIL/uL (ref 3.87–5.11)
RDW: 17 % — ABNORMAL HIGH (ref 11.5–15.5)
WBC: 14 10*3/uL — ABNORMAL HIGH (ref 4.0–10.5)
nRBC: 0 % (ref 0.0–0.2)

## 2023-03-03 LAB — BASIC METABOLIC PANEL
Anion gap: 10 (ref 5–15)
BUN: 8 mg/dL (ref 6–20)
CO2: 26 mmol/L (ref 22–32)
Calcium: 8.8 mg/dL — ABNORMAL LOW (ref 8.9–10.3)
Chloride: 102 mmol/L (ref 98–111)
Creatinine, Ser: 0.62 mg/dL (ref 0.44–1.00)
GFR, Estimated: >60 mL/min (ref >60)
Glucose, Bld: 118 mg/dL — ABNORMAL HIGH (ref 70–99)
Potassium: 4 mmol/L (ref 3.5–5.1)
Sodium: 138 mmol/L (ref 135–145)

## 2023-03-03 MED ORDER — PANTOPRAZOLE SODIUM 40 MG IV SOLR
40.0000 mg | Freq: Two times a day (BID) | INTRAVENOUS | Status: DC
  Administered 2023-03-03: 40 mg via INTRAVENOUS
  Filled 2023-03-03: qty 10

## 2023-03-03 MED ORDER — HYDROCODONE-ACETAMINOPHEN 7.5-325 MG/15ML PO SOLN
10.0000 mL | Freq: Four times a day (QID) | ORAL | Status: DC | PRN
  Filled 2023-03-03: qty 15

## 2023-03-03 MED ORDER — PANTOPRAZOLE SODIUM 40 MG PO PACK: 40.0000 mg | PACK | Freq: Every day | ORAL | 1 refills | Status: DC

## 2023-03-03 MED ORDER — HYDROCODONE-ACETAMINOPHEN 7.5-325 MG/15ML PO SOLN
10.0000 mL | Freq: Four times a day (QID) | ORAL | 0 refills | Status: DC | PRN
Start: 2023-03-03 — End: 2023-04-03

## 2023-03-03 MED ORDER — IOHEXOL 300 MG/ML  SOLN
100.0000 mL | Freq: Once | INTRAMUSCULAR | Status: AC | PRN
  Administered 2023-03-03: 25 mL via ORAL

## 2023-03-03 NOTE — Progress Notes (Signed)
Dallas Schimke to be D/C'd home per MD order. Discussed with the patient and all questions fully answered. Pill crusher provided to patient. Skin clean, dry and intact without evidence of skin break down, no evidence of skin tears noted.  IV catheters discontinued intact. Site without signs and symptoms of complications. Dressing and pressure applied.  An After Visit Summary was printed and given to the patient.  Patient escorted via WC, and D/C home via private auto.  Jon Gills  03/03/2023 2:30 PM

## 2023-03-03 NOTE — Discharge Summary (Signed)
Physician Discharge Summary  Patient ID: Donna Lopez MRN: [id number] DOB/AGE: [birth year] 57 y.o.  Admit date: 03/02/2023 Discharge date: 03/04/2023  Admission Diagnoses:  Discharge Diagnoses:  Principal Problem:   S/P repair of paraesophageal hernia Active Problems:   S/P laparoscopic procedure   Discharged Condition: stable  History of Present Illness:  Donna Lopez is a 57 yo female with known history of GERD, morbid obesity, and known Hiatal Hernia.  This hernia has been present for 20 years, however she has recently been experiencing worsening reflux and dysphagia.  She also experiences worsening shortness of breath after eating.  She has lost 10 lbs which was intentional.  She was referred to Dr. Cliffton Asters who recommended EGD, Robotic assisted Laparoscopy with paraesophageal hernia repair with loose fundoplication.  The risks and benefits of the procedure were explained to the patient and she was agreeable to proceed.    Hospital Course:  Donna Lopez presented to [facility] on 03/02/2023.  She was taken to the operating room and underwent EGD, repair of paraesophageal hernia and fundoplication.  She tolerated the procedure, was extubated and taken to the PACU in stable condition. Swallow study on POD1 showed no leak. She was started on a dysphagia 1 diet and tolerated this well without nausea or vomiting. Her incisions were healing well without sign of infection. She was counseled on the importance of a dysphagia 1 diet and no oral pills. She was felt stable for discharge home.   Consults: None  Significant Diagnostic Studies:  CLINICAL DATA:  Shortness of breath.  Hiatal hernia.   EXAM: CT CHEST WITHOUT CONTRAST   TECHNIQUE: Multidetector CT imaging of the chest was performed following the standard protocol without IV contrast.   RADIATION DOSE REDUCTION: This exam was performed according to the departmental dose-optimization program which includes  automated exposure control, adjustment of the mA and/or kV according to patient size and/or use of iterative reconstruction technique.   COMPARISON:  May 14, 2022.  March 17, 2022.   FINDINGS: Cardiovascular: No evidence of thoracic aortic aneurysm. Mild cardiomegaly. No pericardial effusion. Mild coronary artery calcifications are noted.   Mediastinum/Nodes: Stable large sliding-type hiatal hernia. Thyroid gland is unremarkable. No adenopathy is noted.   Lungs/Pleura: No pneumothorax is noted. Right lung is clear. There is again noted small loculated left pleural effusion which is not significantly changed. There appears to be associated subsegmental atelectasis or scarring.   Upper Abdomen: No acute abnormality.   Musculoskeletal: No chest wall mass or suspicious bone lesions identified.   IMPRESSION: Stable large sliding-type hiatal hernia.   Grossly stable small loculated left pleural effusion with associated subsegmental atelectasis or scarring.   Mild coronary artery calcifications are noted.     Electronically Signed   By: Lupita Raider M.D.   On: 01/23/2023 13:54   Treatments: surgery: 03/02/2023   Patient:  Dallas Schimke Pre-Op Dx: paraesophageal hernia    Post-op Dx:  same Procedure: - Esophagoscopy - Robotic assisted laparoscopy - Paraesophageal hernia repair with OviTex1S resorbable mesh pledgets - Toupet Fundoplication     Surgeon and Role:      * Lightfoot, Eliezer Lofts, MD - Primary  PATHOLOGY:  Discharge Exam: Blood pressure 132/74, pulse 95, temperature 98.3 F (36.8 C), temperature source Axillary, resp. rate (!) 22, height 5\' 2"  (1.575 m), weight 106.9 kg, last menstrual period 08/18/2018, SpO2 92%. General appearance: alert, cooperative, and no distress Neurologic: intact Heart: regular rate and rhythm, S1, S2 normal, no murmur, click,  rub or gallop Lungs: clear to auscultation bilaterally Abdomen: soft, non-tender; bowel  sounds normal; no masses,  no organomegaly Extremities: extremities normal, atraumatic, no cyanosis or edema Wound: Overall clean and dry without sign of infection. The left abdominal incision has some dried bloody drainage but looks like it has stopped  Disposition: Discharge disposition: 01-Home or Self Care        Allergies as of 03/03/2023       Reactions   Penicillins Hives   Did it involve swelling of the face/tongue/throat, SOB, or low BP? No Did it involve sudden or severe rash/hives, skin peeling, or any reaction on the inside of your mouth or nose? No Did you need to seek medical attention at a hospital or doctor's office? Yes When did it last happen?      15 -20 years ago If all above answers are "NO", may proceed with cephalosporin use.        Medication List     STOP taking these medications    esomeprazole 20 MG capsule Commonly known as: NexIUM   methotrexate 2.5 MG tablet Commonly known as: RHEUMATREX   naproxen sodium 220 MG tablet Commonly known as: ALEVE   Rasuvo 25 MG/0.5ML Soaj Generic drug: Methotrexate (PF)   Rinvoq 15 MG Tb24 Generic drug: Upadacitinib ER       TAKE these medications    aspirin EC 81 MG tablet Take 81 mg by mouth 4 (four) times a week. Swallow whole. At night   Calcium + D3 600-200 MG-UNIT Tabs Take 1 capsule by mouth every evening.   folic acid 1 MG tablet Commonly known as: FOLVITE TAKE 2 TABLETS BY MOUTH EVERY DAY What changed: when to take this   HYDROcodone-acetaminophen 7.5-325 mg/15 ml solution Commonly known as: HYCET Take 10 mLs by mouth every 6 (six) hours as needed for moderate pain (pain score 4-6).   L-Lysine 1000 MG Tabs Take 1,000 mg by mouth every evening.   OSTEO BI-FLEX REGULAR STRENGTH PO Take 2 tablets by mouth at bedtime.   rosuvastatin 10 MG tablet Commonly known as: CRESTOR Take 10 mg by mouth every other day. In the evening.   valACYclovir 1000 MG tablet Commonly known as:  VALTREX TAKE 2 TABLETS BY MOUTH AS A SINGLE DOSE AT FIRST SIGN OF COLD SORE THEN REPEAT IN 12 HOURS What changed:  how much to take how to take this when to take this reasons to take this additional instructions   Vitamin D 50 MCG (2000 UT) Caps Take 2,000 Units by mouth every evening.        Follow-up Information     Corliss Skains, MD Follow up on 03/09/2023.   Specialty: Cardiothoracic Surgery Why: Virtual follow up appointment is at 2:00PM. Please do NOT come to the office as this is a VIRTUAL appointment. Dr. Cliffton Asters will call you. Contact information: [address] [place] [address] [phone]                 Signed: Jenny Reichmann, PA-C  03/04/2023, 7:08 AM

## 2023-03-03 NOTE — Anesthesia Postprocedure Evaluation (Signed)
Anesthesia Post Note  Patient: Donna Lopez  Procedure(s) Performed: XI ROBOTIC ASSISTED PARAESOPHAGEAL HERNIA REPAIR (Chest) ESOPHAGOGASTRODUODENOSCOPY (EGD)     Patient location during evaluation: PACU Anesthesia Type: General Level of consciousness: awake and alert Pain management: pain level controlled Vital Signs Assessment: post-procedure vital signs reviewed and stable Respiratory status: spontaneous breathing, nonlabored ventilation, respiratory function stable and patient connected to nasal cannula oxygen Cardiovascular status: blood pressure returned to baseline and stable Postop Assessment: no apparent nausea or vomiting Anesthetic complications: no   No notable events documented.                 Jaiceon Collister S

## 2023-03-03 NOTE — Progress Notes (Signed)
      [address]       [place] [address]             [phone]      1 Day Post-Op Procedure(s) (LRB): XI ROBOTIC ASSISTED PARAESOPHAGEAL HERNIA REPAIR (N/A) ESOPHAGOGASTRODUODENOSCOPY (EGD) (N/A) Subjective: Patient is up sitting in the chair. Complains of some pain in her epigastric area and around her incisions. She also states she is very thirsty.  Objective: Vital signs in last 24 hours: Temp:  [97.6 F (36.4 C)-98.2 F (36.8 C)] 98.1 F (36.7 C) (10/15 0642) Pulse Rate:  [84-100] 93 (10/15 0642) Cardiac Rhythm: Sinus tachycardia (10/14 1953) Resp:  [14-20] 19 (10/15 0642) BP: (98-129)/(75-87) 118/77 (10/15 0642) SpO2:  [90 %-97 %] 91 % (10/15 0642) Weight:  [106.9 kg] 106.9 kg (10/14 1153)  Hemodynamic parameters for last 24 hours:    Intake/Output from previous day: 10/14 0701 - 10/15 0700 In: 1302.8 [I.V.:700; IV Piggyback:602.8] Out: 1325 [Urine:1300; Blood:25] Intake/Output this shift: No intake/output data recorded.  General appearance: alert, cooperative, and no distress Neurologic: intact Heart: regular rate and rhythm, S1, S2 normal, no murmur, click, rub or gallop Lungs: clear to auscultation bilaterally Abdomen: soft, non-tender; bowel sounds normal; no masses,  no organomegaly Extremities: extremities normal, atraumatic, no cyanosis or edema Wound: Overall clean and dry without sign of infection. The left abdominal incision has some dried bloody drainage but looks like it has stopped.   Lab Results: Recent Labs    03/03/23 0226  WBC 14.0*  HGB 11.8*  HCT 36.2  PLT 265   BMET:  Recent Labs    03/03/23 0226  NA 138  K 4.0  CL 102  CO2 26  GLUCOSE 118*  BUN 8  CREATININE 0.62  CALCIUM 8.8*    PT/INR: No results for input(s): "LABPROT", "INR" in the last 72 hours. ABG No results found for: "PHART", "HCO3", "TCO2", "ACIDBASEDEF", "O2SAT" CBG (last 3)  No results for input(s): "GLUCAP" in the last 72  hours.  Assessment/Plan: S/P Procedure(s) (LRB): XI ROBOTIC ASSISTED PARAESOPHAGEAL HERNIA REPAIR (N/A) ESOPHAGOGASTRODUODENOSCOPY (EGD) (N/A)  Neuro: Pain overall controlled  CV: Stable vital signs. NSR, HR 90s. BP controlled.   Pulm: Saturating well on RA. Encourage ambulation today  GI: NPO, will start clear liquids as discussed with Dr. Cliffton Asters. Will get swallow study this AM. If no leak can start dysphagia 1 diet.   Endo: No hx of DM  Renal: Cr 0.62. Good UO.   ID: Leukocytosis, WBC 14. Tmax 98.2. Likely reactive.   Expected postop ABLA: H/H 11.8/36.2. Not clinically significant at this time.   DVT Prophylaxis: Lovenox  Dispo: Swallow study this AM, if no leak will start dysphagia 1 diet. If diet is tolerated can discharge later today.   LOS: 0 days    Jenny Reichmann, PA-C 03/03/2023

## 2023-03-09 ENCOUNTER — Ambulatory Visit (INDEPENDENT_AMBULATORY_CARE_PROVIDER_SITE_OTHER): Payer: Self-pay | Admitting: Thoracic Surgery (Cardiothoracic Vascular Surgery)

## 2023-03-09 DIAGNOSIS — K449 Diaphragmatic hernia without obstruction or gangrene: Secondary | ICD-10-CM

## 2023-03-09 NOTE — Progress Notes (Signed)
     [address]       [place] [address]             [phone]       Patient: Home Provider: Office Consent for Telemedicine visit obtained.  Today's visit was completed via a real-time telehealth (see specific modality noted below). The patient/authorized person provided oral consent at the time of the visit to engage in a telemedicine encounter with the present provider at [facility]. The patient/authorized person was informed of the potential benefits, limitations, and risks of telemedicine. The patient/authorized person expressed understanding that the laws that protect confidentiality also apply to telemedicine. The patient/authorized person acknowledged understanding that telemedicine does not provide emergency services and that he or she would need to call 911 or proceed to the nearest hospital for help if such a need arose.   Total time spent in the clinical discussion 10 minutes.  Telehealth Modality: Phone visit (audio only)  I had a telephone visit with Donna Lopez.  Overall doing well.  She has had some coughing.  She denies any significant pain, and has not been taking any pain medication.  She denies any reflux or dysphagia.  She will follow-up in 1 month with a CXR.

## 2023-03-16 ENCOUNTER — Encounter: Payer: Self-pay | Admitting: Rheumatology

## 2023-03-16 ENCOUNTER — Telehealth: Payer: Self-pay | Admitting: *Deleted

## 2023-03-16 NOTE — Telephone Encounter (Signed)
Patient called c/o difficulty and pain swallowing pills. States she was on a soft diet all last week. Patient reports eating eggs on Saturday and the eggs getting stuck in her chest. States eggs eventually passed. Patient states she took a pill for the first time this morning and same situation occurred where pill felt stuck. Per Dr. Cliffton Asters, patient advised to continue soft diet this week and to crush pills instead of taking whole. Patient verbalized understanding.

## 2023-03-18 ENCOUNTER — Telehealth: Payer: 59 | Admitting: Thoracic Surgery (Cardiothoracic Vascular Surgery)

## 2023-03-19 ENCOUNTER — Other Ambulatory Visit: Payer: Self-pay | Admitting: Rheumatology

## 2023-03-19 DIAGNOSIS — M0579 Rheumatoid arthritis with rheumatoid factor of multiple sites without organ or systems involvement: Secondary | ICD-10-CM

## 2023-03-24 NOTE — Progress Notes (Signed)
Office Visit Note  Patient: Donna Lopez             Date of Birth: 1966/01/08           MRN: [id number]             PCP: Melida Quitter, MD Referring: Melida Quitter, MD Visit Date: 04/07/2023 Occupation: @GUAROCC @  Subjective:  Medication monitoring   History of Present Illness: Donna Lopez is a 57 y.o. female with history of seropositive rheumatoid arthritis and osteoarthritis.  Patient remains on Rinvoq 15 mg 1 tablet by mouth daily (started in July 2022), methotrexate 25 mg sq injections once weekly,and folic acid 2 mg daily.  She is tolerating combination therapy without any side effects.  Patient reports she recently underwent paraesophageal hernia repair on 03/02/23.  She held rinvoq for 3 weeks and methotrexate for a total of 5 weeks around the time of surgery.  She resumed both medications 1-2 weeks ago.  She denies any signs or symptoms of a flare during the gap in therapy. She denies any morning stiffness or nocturnal pain.    She denies any recent infections.      Activities of Daily Living:  Patient reports morning stiffness for 0 minutes.   Patient Denies nocturnal pain.  Difficulty dressing/grooming: Denies Difficulty climbing stairs: Denies Difficulty getting out of chair: Denies Difficulty using hands for taps, buttons, cutlery, and/or writing: Denies  Review of Systems  Constitutional:  Negative for fatigue.  HENT:  Negative for mouth sores and mouth dryness.   Eyes:  Negative for dryness.  Respiratory:  Negative for shortness of breath.   Cardiovascular:  Negative for chest pain and palpitations.  Gastrointestinal:  Negative for blood in stool, constipation and diarrhea.  Endocrine: Negative for increased urination.  Genitourinary:  Negative for involuntary urination.  Musculoskeletal:  Positive for myalgias, muscle tenderness and myalgias. Negative for joint pain, gait problem, joint pain, joint swelling, muscle weakness and morning stiffness.  Skin:   Positive for sensitivity to sunlight. Negative for color change, rash and hair loss.  Allergic/Immunologic: Negative for susceptible to infections.  Neurological:  Negative for dizziness and headaches.  Hematological:  Negative for swollen glands.  Psychiatric/Behavioral:  Negative for depressed mood and sleep disturbance. The patient is not nervous/anxious.     PMFS History:  Patient Active Problem List   Diagnosis Date Noted   S/P repair of paraesophageal hernia 03/02/2023   S/P laparoscopic procedure 03/02/2023   Dysphagia 06/25/2022   Age-related osteoporosis without current pathological fracture 08/15/2021   Pleural effusion on left 04/22/2021   Preventative health care 04/01/2021   Unilateral primary osteoarthritis, left knee 12/17/2018   Status post total left knee replacement 12/17/2018   Primary osteoarthritis of left knee 11/23/2018   Carpal tunnel syndrome, left upper limb 11/23/2018   BMI 40.0-44.9, adult (HCC) 07/12/2018   Primary osteoarthritis of both knees 12/24/2016   DDD (degenerative disc disease), thoracic 11/17/2016   DDD (degenerative disc disease), lumbar 11/17/2016   Vitamin D deficiency 11/17/2016   History of rosacea/ History of acne 11/17/2016   High risk medications (not anticoagulants) long-term use 06/25/2016   History of degenerative disc disease 06/25/2016   Severe obesity (BMI >= 40) (HCC) 06/13/2014   Left-sided thoracic back pain 07/19/2013   PALPITATIONS 10/17/2009   UTI 05/24/2009   Rheumatoid arthritis (HCC) 05/24/2009   Hair loss 02/22/2009   VAGINAL BLEEDING 10/05/2008   Carrier of group B Streptococcus 07/28/2007   GERD 12/28/2006  History of cardiovascular disorder 12/28/2006   Hiatal hernia 12/28/2006    Past Medical History:  Diagnosis Date   Allergy    Arthritis    Carpal tunnel syndrome 10/2018   DDD (degenerative disc disease), lumbar    DDD (degenerative disc disease), thoracic    Family history of adverse reaction to  anesthesia    Mothers sister never woke up from surgery   GERD (gastroesophageal reflux disease)    Grade I diastolic dysfunction 2013   Heart murmur    Heart palpitations    History of   History of hiatal hernia    OA (osteoarthritis)    Obese    Osteoporosis    Rheumatoid arthritis (HCC)    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    Vitamin D deficiency     Family History  Problem Relation Age of Onset   Uterine cancer Mother    Diabetes Father    Kidney cancer Father    Colon cancer Sister 5   Rectal cancer Sister    Colon cancer Paternal Grandfather    Colon polyps Brother    Cancer Sister        breast    Healthy Daughter    Esophageal cancer Neg Hx    Stomach cancer Neg Hx    Past Surgical History:  Procedure Laterality Date   COLONOSCOPY  2014   DB- normal    ESOPHAGEAL MANOMETRY N/A 04/23/2022   Procedure: ESOPHAGEAL MANOMETRY (EM);  Surgeon: Napoleon Form, MD;  Location: [facility];  Service: Gastroenterology;  Laterality: N/A;   ESOPHAGOGASTRODUODENOSCOPY N/A 03/02/2023   Procedure: ESOPHAGOGASTRODUODENOSCOPY (EGD);  Surgeon: Corliss Skains, MD;  Location[facility] OR;  Service: Thoracic;  Laterality: N/A;   KNEE ARTHROPLASTY     THORACENTESIS  04/2021   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   Procedure: LEFT TOTAL KNEE ARTHROPLASTY;  Surgeon: Kathryne Hitch, MD;  Location: [facility];  Service: Orthopedics;  Laterality: Left;   UPPER GASTROINTESTINAL ENDOSCOPY     XI ROBOTIC ASSISTED PARAESOPHAGEAL HERNIA REPAIR N/A 03/02/2023   Procedure: XI ROBOTIC ASSISTED PARAESOPHAGEAL HERNIA REPAIR;  Surgeon: Corliss Skains, MD;  Location: [facility] OR;  Service: Thoracic;  Laterality: N/A;   Social History   Social History Narrative   Exercise--- sometimes walks 1x a week   Immunization History  Administered Date(s) Administered   Influenza Whole 02/08/2009   Influenza,inj,Quad PF,6+ Mos 03/09/2013, 06/13/2014, 06/16/2016, 02/14/2019, 03/30/2020, 04/01/2021    PFIZER(Purple Top)SARS-COV-2 Vaccination 08/26/2019, 09/27/2019, 05/30/2020   PNEUMOCOCCAL CONJUGATE-20 04/01/2021   Td 09/29/2001   Tdap 06/13/2014     Objective: Vital Signs: BP 117/77 (BP Location: Left Arm, Patient Position: Sitting, Cuff Size: Large)   Pulse 72   Resp 16   Ht 5\' 2"  (1.575 m)   Wt 227 lb 6.4 oz (103.1 kg)   LMP 08/18/2018   BMI 41.59 kg/m    Physical Exam Vitals and nursing note reviewed.  Constitutional:      Appearance: She is well-developed.  HENT:     Head: Normocephalic and atraumatic.  Eyes:     Conjunctiva/sclera: Conjunctivae normal.  Cardiovascular:     Rate and Rhythm: Normal rate and regular rhythm.     Heart sounds: Normal heart sounds.  Pulmonary:     Effort: Pulmonary effort is normal.     Breath sounds: Normal breath sounds.  Abdominal:     General: Bowel sounds are normal.     Palpations: Abdomen is soft.  Musculoskeletal:     Cervical back:  Normal range of motion.  Lymphadenopathy:     Cervical: No cervical adenopathy.  Skin:    General: Skin is warm and dry.     Capillary Refill: Capillary refill takes less than 2 seconds.  Neurological:     Mental Status: She is alert and oriented to person, place, and time.  Psychiatric:        Behavior: Behavior normal.      Musculoskeletal Exam: C-spine has good ROM.  Thoracic kyphosis noted.  Shoulder joints have good ROM with no discomfort.  Elbow joints have good ROM with no tenderness or swelling along the joint line.  Limited flexion and extension of both wrist joints.  Thickening of both wrists and all MCP joints. PIP and DIP thickening.  Hip joints have slightly limited ROM with no groin pain.  Left knee replacement has good ROM.  Right knee has good ROM with no warmth or effusion.  Ankle joints have good ROM with no tenderness or swelling.    CDAI Exam: CDAI Score: -- Patient Global: --; Provider Global: -- Swollen: --; Tender: -- Joint Exam 04/07/2023   No joint exam has been  documented for this visit   There is currently no information documented on the homunculus. Go to the Rheumatology activity and complete the homunculus joint exam.  Investigation: No additional findings.  Imaging: DG Chest 2 View  Result Date: 04/03/2023 CLINICAL DATA:  Cough status post paraesophageal surgery. EXAM: CHEST - 2 VIEW COMPARISON:  None Available. FINDINGS: The heart size and mediastinal contours are within normal limits. Minimal right basilar subsegmental atelectasis or scarring is noted. Mild left basilar atelectasis is noted with small left pleural effusion. The visualized skeletal structures are unremarkable. IMPRESSION: Mild left basilar atelectasis is noted with small left pleural effusion. Electronically Signed   By: Lupita Raider M.D.   On: 04/03/2023 13:31    Recent Labs: Lab Results  Component Value Date   WBC 14.0 (H) 03/03/2023   HGB 11.8 (L) 03/03/2023   PLT 265 03/03/2023   NA 138 03/03/2023   K 4.0 03/03/2023   CL 102 03/03/2023   CO2 26 03/03/2023   GLUCOSE 118 (H) 03/03/2023   BUN 8 03/03/2023   CREATININE 0.62 03/03/2023   BILITOT 0.4 02/26/2023   ALKPHOS 64 02/26/2023   AST 28 02/26/2023   ALT 27 02/26/2023   PROT 7.6 02/26/2023   ALBUMIN 3.5 02/26/2023   CALCIUM 8.8 (L) 03/03/2023   GFRAA 114 10/12/2020   QFTBGOLDPLUS NEGATIVE 12/18/2022    Speciality Comments: Humira- quit working Simponi- inadequate response Orencia-since 07/2019 Reclast start 07/18/20, September 06, 2021  Procedures:  No procedures performed Allergies: Penicillins   Assessment / Plan:     Visit Diagnoses: Rheumatoid arthritis involving multiple sites with positive rheumatoid factor (HCC) - Positive rheumatoid factor, positive anti-CCP, erosive disease:  She has no joint tenderness or synovitis on examination today.  She has not had any signs or symptoms of a flare recently.  She is clinically doing well taking Rinvoq 15 mg 1 tablet by mouth daily, methotrexate 25 mg sq  injections once weekly, and folic acid 2 mg daily.  She recently held methotrexate for 5 weeks and rinvoq for 3 weeks s/p paraesophageal hernia repair on 03/02/23.  She resumed therapy 1-2 weeks ago.  She did not experience any signs of a flare during the gap in therapy. No medication changes will be made at this time.  She was advised to notify us if she develops signs  or symptoms of a flare.  She will follow up in 5 months or sooner if needed.   - Plan: Lipid panel  High risk medications (not anticoagulants) long-term use - Rinvoq 15 mg 1 tablet by mouth daily (started in July 2022), methotrexate 25 mg sq injections once weekly, Folic acid 2 mg daily. Patient may switch back to oral Methotrexate in the future but plans on finishing the current methotrexate prescription she has.  She would prefer oral methotrexate instead of weekly injections.  CBC and BMP updated on 03/03/23. CMP updated on 02/26/23. Her next lab work will be due in January and every 3 months.  Standing orders for CBC and CMP remain in place.  TB gold negative 12/18/22.  Future order for lipid panel placed today.  No recent or recurrent infections. Discussed the importance of holding rinvoq and methotrexate if she develops signs or symptoms of an infection and to resume once the infection has completely cleared.   - Plan: Lipid panel  Screening for tuberculosis: TB gold negative on 12/18/22.   Lipid screening - Future order for lipid panel placed today to be drawn with next lab work in January.  Plan: Lipid panel  Pleural effusion, left - Chest x-ray on 04/16/21: findings: small left effusion and bibasilar atelectasis.  Underwent thoracentesis on 04/25/2021.  Primary osteoarthritis of right knee: No warmth or effusion noted.   S/P total knee arthroplasty, left - 12/17/2018 by Dr. Magnus Ivan. Doing well.  No effusion.   DDD (degenerative disc disease), thoracic: Thoracic kyphosis noted. No midline spinal tenderness.   Degeneration of  intervertebral disc of lumbar region without discogenic back pain or lower extremity pain: She is not experiencing any increased discomfort in her lower back at this time.   Age-related osteoporosis without current pathological fracture - DEXA updated on 03/05/2022: Left femoral neck BMD 0.605 with T-score -2.2. Last IV Reclast infusion was administered on 09/06/21. She is currently on a drug holiday.  She is taking a calcium and vitamin D supplement daily.  Due to updated DEXA October 2025.    Vitamin D deficiency: She is taking a daily calcium and vitamin D supplement.   Other medical conditions are listed as follows:   History of hiatal hernia: S/p robotic assisted paraesophageal hernia repair on 03/02/23.  No complications or signs of infection.   Resumed Methotrexate and rinvoq 1-2 weeks ago.    History of gastroesophageal reflux (GERD)  History of rosacea/ History of acne    Orders: Orders Placed This Encounter  Procedures   Lipid panel   No orders of the defined types were placed in this encounter.    Follow-Up Instructions: No follow-ups on file.   Gearldine Bienenstock, PA-C  Note - This record has been created using Dragon software.  Chart creation errors have been sought, but may not always  have been located. Such creation errors do not reflect on  the standard of medical care.

## 2023-04-01 ENCOUNTER — Other Ambulatory Visit: Payer: Self-pay | Admitting: Thoracic Surgery (Cardiothoracic Vascular Surgery)

## 2023-04-01 DIAGNOSIS — K449 Diaphragmatic hernia without obstruction or gangrene: Secondary | ICD-10-CM

## 2023-04-03 ENCOUNTER — Ambulatory Visit
Admission: RE | Admit: 2023-04-03 | Discharge: 2023-04-03 | Disposition: A | Discharge status: Home / Self Care | Payer: 59 | Source: Ambulatory Visit | Attending: Thoracic Surgery (Cardiothoracic Vascular Surgery) | Admitting: Thoracic Surgery (Cardiothoracic Vascular Surgery)

## 2023-04-03 ENCOUNTER — Ambulatory Visit (INDEPENDENT_AMBULATORY_CARE_PROVIDER_SITE_OTHER): Payer: Self-pay | Admitting: Thoracic Surgery (Cardiothoracic Vascular Surgery)

## 2023-04-03 VITALS — BP 138/91 | HR 77 | Resp 18 | Ht 62.0 in | Wt 224.0 lb

## 2023-04-03 DIAGNOSIS — Z8719 Personal history of other diseases of the digestive system: Secondary | ICD-10-CM

## 2023-04-03 DIAGNOSIS — Z9889 Other specified postprocedural states: Secondary | ICD-10-CM

## 2023-04-03 DIAGNOSIS — K449 Diaphragmatic hernia without obstruction or gangrene: Secondary | ICD-10-CM

## 2023-04-03 NOTE — Progress Notes (Signed)
      [address]       [place] [address]             [phone]        Donna Lopez [facility] Medical Record #[id number] Date of Birth: 1966-02-07  Referring: Napoleon Form, MD Primary Care: Melida Quitter, MD Primary Cardiologist:None  Reason for visit:   follow-up  History of Present Illness:     Donna Lopez presents in follow-up.  Overall she is doing well.  She denies any reflux.  She recently had an episode of dysphagia, but she attributes this to eating too fast and not chewing her food.  Otherwise she is doing well.  Physical Exam: BP (!) 138/91   Pulse 77   Resp 18   Ht 5\' 2"  (1.575 m)   Wt 224 lb (101.6 kg)   LMP 08/18/2018   SpO2 99% Comment: RA  BMI 40.97 kg/m   Alert NAD Abdomen, ND No peripheral edema   Diagnostic Studies & Laboratory data: CXR: Clear     Assessment / Plan:   57 year old female status post robotic assisted paraesophageal hernia repair.  I explained to her that given the size of her previous hernia there likely will be some motility challenges with her esophagus, but this should continue to improve.  She is to be on weight restrictions for another 2 weeks.  I will touch base with her in 3 months.   Donna Lopez 04/03/2023 3:59 PM

## 2023-04-07 ENCOUNTER — Encounter: Payer: Self-pay | Admitting: Physician Assistant

## 2023-04-07 ENCOUNTER — Ambulatory Visit: Payer: 59 | Attending: Physician Assistant | Admitting: Physician Assistant

## 2023-04-07 VITALS — BP 117/77 | HR 72 | Resp 16 | Ht 62.0 in | Wt 227.4 lb

## 2023-04-07 DIAGNOSIS — J90 Pleural effusion, not elsewhere classified: Secondary | ICD-10-CM | POA: Diagnosis not present

## 2023-04-07 DIAGNOSIS — Z79899 Other long term (current) drug therapy: Secondary | ICD-10-CM

## 2023-04-07 DIAGNOSIS — Z872 Personal history of diseases of the skin and subcutaneous tissue: Secondary | ICD-10-CM

## 2023-04-07 DIAGNOSIS — M0579 Rheumatoid arthritis with rheumatoid factor of multiple sites without organ or systems involvement: Secondary | ICD-10-CM | POA: Diagnosis not present

## 2023-04-07 DIAGNOSIS — Z13220 Encounter for screening for lipoid disorders: Secondary | ICD-10-CM

## 2023-04-07 DIAGNOSIS — Z96652 Presence of left artificial knee joint: Secondary | ICD-10-CM

## 2023-04-07 DIAGNOSIS — M5134 Other intervertebral disc degeneration, thoracic region: Secondary | ICD-10-CM

## 2023-04-07 DIAGNOSIS — Z111 Encounter for screening for respiratory tuberculosis: Secondary | ICD-10-CM

## 2023-04-07 DIAGNOSIS — M810 Age-related osteoporosis without current pathological fracture: Secondary | ICD-10-CM

## 2023-04-07 DIAGNOSIS — M51369 Other intervertebral disc degeneration, lumbar region without mention of lumbar back pain or lower extremity pain: Secondary | ICD-10-CM

## 2023-04-07 DIAGNOSIS — M1711 Unilateral primary osteoarthritis, right knee: Secondary | ICD-10-CM

## 2023-04-07 DIAGNOSIS — Z8719 Personal history of other diseases of the digestive system: Secondary | ICD-10-CM

## 2023-04-07 DIAGNOSIS — E559 Vitamin D deficiency, unspecified: Secondary | ICD-10-CM

## 2023-04-30 ENCOUNTER — Other Ambulatory Visit: Payer: Self-pay | Admitting: Rheumatology

## 2023-04-30 NOTE — Telephone Encounter (Signed)
Last Fill: 01/23/2023  Labs: 03/03/2023 WBC 14.0, Hgb 11.8, RDW 17.0, Glucose 118, Calcium 8.8  Next Visit: 09/07/2023  Last Visit: 04/07/2023  DX: Rheumatoid arthritis involving multiple sites with positive rheumatoid factor   Current Dose per office note 04/07/2023: methotrexate 25 mg sq injections once weekly   Okay to refill Rasuvo?

## 2023-05-04 ENCOUNTER — Encounter: Payer: Self-pay | Admitting: Gastroenterology

## 2023-05-04 ENCOUNTER — Other Ambulatory Visit: Payer: Self-pay | Admitting: Physician Assistant

## 2023-05-04 NOTE — Telephone Encounter (Signed)
Last Fill: 01/06/2023  Labs: 03/03/2023 WBC 14.0, hemoglobin 11.8, RDW 17.0, Glucose 118, Calcium 8.8,   TB Gold: 12/18/2022  TB Gold negative.   Next Visit: 09/07/2023  Last Visit: 04/07/2023  XB:[id number] arthritis involving multiple sites with positive rheumatoid factor   Current Dose per office note 04/07/2023: Rinvoq 15 mg 1 tablet by mouth daily   Okay to refill Rinvoq?

## 2023-06-04 ENCOUNTER — Encounter: Payer: Self-pay | Admitting: Gastroenterology

## 2023-06-16 ENCOUNTER — Other Ambulatory Visit: Payer: Self-pay | Admitting: Internal Medicine

## 2023-06-16 DIAGNOSIS — Z0000 Encounter for general adult medical examination without abnormal findings: Secondary | ICD-10-CM

## 2023-06-23 ENCOUNTER — Ambulatory Visit (AMBULATORY_SURGERY_CENTER): Payer: 59

## 2023-06-23 VITALS — Ht 62.0 in | Wt 225.0 lb

## 2023-06-23 DIAGNOSIS — Z800 Family history of malignant neoplasm of digestive organs: Secondary | ICD-10-CM

## 2023-06-23 DIAGNOSIS — Z83719 Family history of colon polyps, unspecified: Secondary | ICD-10-CM

## 2023-06-23 MED ORDER — SUFLAVE 178.7 G PO SOLR
1.0000 | Freq: Once | ORAL | 0 refills | Status: AC
Start: 2023-06-23 — End: 2023-06-23

## 2023-06-23 NOTE — Progress Notes (Signed)

## 2023-07-03 ENCOUNTER — Ambulatory Visit: Payer: 59 | Admitting: Thoracic Surgery (Cardiothoracic Vascular Surgery)

## 2023-07-03 DIAGNOSIS — Z9889 Other specified postprocedural states: Secondary | ICD-10-CM

## 2023-07-03 DIAGNOSIS — Z8719 Personal history of other diseases of the digestive system: Secondary | ICD-10-CM | POA: Diagnosis not present

## 2023-07-03 NOTE — Progress Notes (Signed)
     [address]       [place] [address]             [phone]       Patient: Donna Lopez: Office Consent for Telemedicine visit obtained.  Today's visit was completed via a real-time telehealth (see specific modality noted below). The patient/authorized person provided oral consent at the time of the visit to engage in a telemedicine encounter with the present Lopez at [facility]. The patient/authorized person was informed of the potential benefits, limitations, and risks of telemedicine. The patient/authorized person expressed understanding that the laws that protect confidentiality also apply to telemedicine. The patient/authorized person acknowledged understanding that telemedicine does not provide emergency services and that he or she would need to call 911 or proceed to the nearest hospital for help if such a need arose.   Total time spent in the clinical discussion 10 minutes.  Telehealth Modality: Phone visit (audio only)  I had a telephone visit with Donna Lopez.  Overall, she is doing well.  She denies dysphagia or reflux.  She is eating well without any problems.  She will follow up as needed.  Norman Bier Keane Scrape

## 2023-07-13 ENCOUNTER — Encounter: Payer: Self-pay | Admitting: Gastroenterology

## 2023-07-15 ENCOUNTER — Ambulatory Visit (AMBULATORY_SURGERY_CENTER): Payer: 59 | Admitting: Gastroenterology

## 2023-07-15 ENCOUNTER — Encounter: Payer: Self-pay | Admitting: Gastroenterology

## 2023-07-15 VITALS — BP 102/66 | HR 80 | Temp 97.2°F | Resp 11 | Ht 62.0 in | Wt 225.0 lb

## 2023-07-15 DIAGNOSIS — K644 Residual hemorrhoidal skin tags: Secondary | ICD-10-CM

## 2023-07-15 DIAGNOSIS — K648 Other hemorrhoids: Secondary | ICD-10-CM | POA: Diagnosis not present

## 2023-07-15 DIAGNOSIS — Z1211 Encounter for screening for malignant neoplasm of colon: Secondary | ICD-10-CM | POA: Diagnosis present

## 2023-07-15 DIAGNOSIS — Z800 Family history of malignant neoplasm of digestive organs: Secondary | ICD-10-CM | POA: Diagnosis not present

## 2023-07-15 DIAGNOSIS — K5730 Diverticulosis of large intestine without perforation or abscess without bleeding: Secondary | ICD-10-CM | POA: Diagnosis not present

## 2023-07-15 HISTORY — DX: Morbid (severe) obesity due to excess calories: E66.01

## 2023-07-15 MED ORDER — SODIUM CHLORIDE 0.9 % IV SOLN: 500.0000 mL | Freq: Once | INTRAVENOUS | Status: DC

## 2023-07-15 NOTE — Op Note (Signed)
 [facility] Patient Name: Donna Lopez Procedure Date: 07/15/2023 8:08 AM MRN: [id number] Endoscopist: Napoleon Form , MD, [id number] Age: 58 Referring MD:  Date of Birth: [birth year] Gender: Female Account #: [id number] Procedure:                Colonoscopy Indications:              Screening in patient at increased risk: Colorectal                            cancer in sister before age 64 Medicines:                Monitored Anesthesia Care Procedure:                Pre-Anesthesia Assessment:                           - Prior to the procedure, a History and Physical                            was performed, and patient medications and                            allergies were reviewed. The patient's tolerance of                            previous anesthesia was also reviewed. The risks                            and benefits of the procedure and the sedation                            options and risks were discussed with the patient.                            All questions were answered, and informed consent                            was obtained. Prior Anticoagulants: The patient has                            taken no anticoagulant or antiplatelet agents. ASA                            Grade Assessment: III - A patient with severe                            systemic disease. After reviewing the risks and                            benefits, the patient was deemed in satisfactory                            condition to undergo the procedure.  After obtaining informed consent, the colonoscope                            was passed under direct vision. Throughout the                            procedure, the patient's blood pressure, pulse, and                            oxygen saturations were monitored continuously. The                            Olympus Scope SN: [id number] was introduced through                            the anus and  advanced to the the cecum, identified                            by appendiceal orifice and ileocecal valve. The                            colonoscopy was performed without difficulty. The                            patient tolerated the procedure well. The quality                            of the bowel preparation was good. The ileocecal                            valve, appendiceal orifice, and rectum were                            photographed. Scope In: 8:20:14 AM Scope Out: 8:36:18 AM Scope Withdrawal Time: 0 hours 10 minutes 7 seconds  Total Procedure Duration: 0 hours 16 minutes 4 seconds  Findings:                 The perianal and digital rectal examinations were                            normal.                           Scattered small-mouthed diverticula were found in                            the sigmoid colon, descending colon, transverse                            colon and ascending colon.                           Non-bleeding external and internal hemorrhoids were  found during retroflexion. The hemorrhoids were                            medium-sized. Complications:            No immediate complications. Estimated Blood Loss:     Estimated blood loss was minimal. Impression:               - Moderate diverticulosis in the sigmoid colon, in                            the descending colon, in the transverse colon and                            in the ascending colon.                           - Non-bleeding external and internal hemorrhoids.                           - No specimens collected. Recommendation:           - Resume previous diet.                           - Continue present medications.                           - Await pathology results.                           - Repeat colonoscopy in 5 years for surveillance. Napoleon Form, MD 07/15/2023 8:46:52 AM This report has been signed electronically.

## 2023-07-15 NOTE — Progress Notes (Signed)
 Pt's states no medical or surgical changes since previsit or office visit.

## 2023-07-15 NOTE — Progress Notes (Signed)
 [facility] History and Physical   Primary Care Physician:  Melida Quitter, MD   Reason for Procedure:  Family history of colon cancer  Plan:    Screening colonoscopy with possible interventions as needed     HPI: Donna Lopez is a very pleasant 58 y.o. female here for screening colonoscopy. Denies any nausea, vomiting, abdominal pain, melena or bright red blood per rectum  The risks and benefits as well as alternatives of endoscopic procedure(s) have been discussed and reviewed. All questions answered. The patient agrees to proceed.    Past Medical History:  Diagnosis Date   Allergy    Arthritis    Carpal tunnel syndrome 10/2018   DDD (degenerative disc disease), lumbar    DDD (degenerative disc disease), thoracic    Family history of adverse reaction to anesthesia    Mothers sister never woke up from surgery   GERD (gastroesophageal reflux disease)    Grade I diastolic dysfunction 2013   Heart murmur    Heart palpitations    History of   History of hiatal hernia    Hyperlipidemia    OA (osteoarthritis)    Obese    Osteoporosis    Rheumatoid arthritis (HCC)    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    Vitamin D deficiency     Past Surgical History:  Procedure Laterality Date   COLONOSCOPY  2014   DB- normal    ESOPHAGEAL MANOMETRY N/A 04/23/2022   Procedure: ESOPHAGEAL MANOMETRY (EM);  Surgeon: Napoleon Form, MD;  Location: [facility];  Service: Gastroenterology;  Laterality: N/A;   ESOPHAGOGASTRODUODENOSCOPY N/A 03/02/2023   Procedure: ESOPHAGOGASTRODUODENOSCOPY (EGD);  Surgeon: Corliss Skains, MD;  Location: [facility] OR;  Service: Thoracic;  Laterality: N/A;   KNEE ARTHROPLASTY Left 2020   THORACENTESIS  04/2021   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   Procedure: LEFT TOTAL KNEE ARTHROPLASTY;  Surgeon: Kathryne Hitch, MD;  Location: [facility];  Service: Orthopedics;  Laterality: Left;   UPPER GASTROINTESTINAL ENDOSCOPY     XI ROBOTIC ASSISTED  PARAESOPHAGEAL HERNIA REPAIR N/A 03/02/2023   Procedure: XI ROBOTIC ASSISTED PARAESOPHAGEAL HERNIA REPAIR;  Surgeon: Corliss Skains, MD;  Location: [facility] OR;  Service: Thoracic;  Laterality: N/A;    Prior to Admission medications   Medication Sig Start Date End Date Taking? Authorizing Provider  aspirin EC 81 MG tablet Take 81 mg by mouth 4 (four) times a week. Swallow whole. At night   Yes [provider]  Calcium Carb-Cholecalciferol (CALCIUM + D3) 600-200 MG-UNIT TABS Take 1 capsule by mouth every evening.   Yes [provider]  Cholecalciferol (VITAMIN D) 50 MCG (2000 UT) CAPS Take 2,000 Units by mouth every evening.   Yes [provider]  folic acid (FOLVITE) 1 MG tablet TAKE 2 TABLETS BY MOUTH EVERY DAY 07/28/22  Yes Gearldine Bienenstock, PA-C  Glucosamine-Chondroitin (OSTEO BI-FLEX REGULAR STRENGTH PO) Take 2 tablets by mouth at bedtime.   Yes [provider]  L-Lysine 1000 MG TABS Take 1,000 mg by mouth every evening.   Yes [provider]  RASUVO 25 MG/0.5ML SOAJ INJECT 1 PEN UNDER THE SKIN ONCE EVERY WEEK 04/30/23  Yes Gearldine Bienenstock, PA-C  RINVOQ 15 MG TB24 TAKE 1 TABLET BY MOUTH 1 TIME A DAY 05/04/23  Yes Gearldine Bienenstock, PA-C  rosuvastatin (CRESTOR) 10 MG tablet Take 10 mg by mouth every other day. In the evening.   Yes [provider]  valACYclovir (VALTREX) 1000 MG tablet TAKE 2  TABLETS BY MOUTH AS A SINGLE DOSE AT FIRST SIGN OF COLD SORE THEN REPEAT IN 12 HOURS Patient taking differently: Take 1,000 mg by mouth every 12 (twelve) hours as needed (onset of cold sores). 04/10/21   Donato Schultz, DO    Current Outpatient Medications  Medication Sig Dispense Refill   aspirin EC 81 MG tablet Take 81 mg by mouth 4 (four) times a week. Swallow whole. At night     Calcium Carb-Cholecalciferol (CALCIUM + D3) 600-200 MG-UNIT TABS Take 1 capsule by mouth every evening.     Cholecalciferol (VITAMIN D) 50 MCG (2000 UT) CAPS Take 2,000  Units by mouth every evening.     folic acid (FOLVITE) 1 MG tablet TAKE 2 TABLETS BY MOUTH EVERY DAY 180 tablet 3   Glucosamine-Chondroitin (OSTEO BI-FLEX REGULAR STRENGTH PO) Take 2 tablets by mouth at bedtime.     L-Lysine 1000 MG TABS Take 1,000 mg by mouth every evening.     RASUVO 25 MG/0.5ML SOAJ INJECT 1 PEN UNDER THE SKIN ONCE EVERY WEEK 2 mL 2   RINVOQ 15 MG TB24 TAKE 1 TABLET BY MOUTH 1 TIME A DAY 90 tablet 0   rosuvastatin (CRESTOR) 10 MG tablet Take 10 mg by mouth every other day. In the evening.     valACYclovir (VALTREX) 1000 MG tablet TAKE 2 TABLETS BY MOUTH AS A SINGLE DOSE AT FIRST SIGN OF COLD SORE THEN REPEAT IN 12 HOURS (Patient taking differently: Take 1,000 mg by mouth every 12 (twelve) hours as needed (onset of cold sores).) 90 tablet 0   Current Facility-Administered Medications  Medication Dose Route Frequency Provider Last Rate Last Admin   0.9 %  sodium chloride infusion  500 mL Intravenous Once Napoleon Form, MD        Allergies as of 07/15/2023 - Review Complete 07/15/2023  Allergen Reaction Noted   Penicillins Hives     Family History  Problem Relation Age of Onset   Uterine cancer Mother    Diabetes Father    Kidney cancer Father    Colon polyps Sister    Colon cancer Sister 40   Rectal cancer Sister    Cancer Sister        breast    Colon polyps Brother    Colon polyps Paternal Grandfather    Colon cancer Paternal Grandfather    Healthy Daughter    Esophageal cancer Neg Hx    Stomach cancer Neg Hx     Social History   Socioeconomic History   Marital status: Married    Spouse name: Not on file   Number of children: Not on file   Years of education: Not on file   Highest education level: Not on file  Occupational History    Employer: [employer]    Comment: marketing  Tobacco Use   Smoking status: Never    Passive exposure: Never   Smokeless tobacco: Never  Vaping Use   Vaping status: Never Used  Substance and Sexual Activity    Alcohol use: Yes    Comment: rare alcohol use-once/twice a year   Drug use: Never   Sexual activity: Yes    Partners: Male  Other Topics Concern   Not on file  Social History Narrative   Exercise--- sometimes walks 1x a week   Social Drivers of Corporate investment banker Strain: Not on file  Food Insecurity: No Food Insecurity (03/02/2023)   Hunger Vital Sign    Worried About Running  Out of Food in the Last Year: Never true    Ran Out of Food in the Last Year: Never true  Transportation Needs: No Transportation Needs (03/02/2023)   PRAPARE - Administrator, Civil Service (Medical): No    Lack of Transportation (Non-Medical): No  Physical Activity: Not on file  Stress: Not on file  Social Connections: Not on file  Intimate Partner Violence: Not At Risk (03/02/2023)   Humiliation, Afraid, Rape, and Kick questionnaire    Fear of Current or Ex-Partner: No    Emotionally Abused: No    Physically Abused: No    Sexually Abused: No    Review of Systems:  All other review of systems negative except as mentioned in the HPI.  Physical Exam: Vital signs in last 24 hours: BP 136/88   Pulse 89   Temp (!) 97.2 F (36.2 C) (Skin)   Resp 18   Ht 5\' 2"  (1.575 m)   Wt 225 lb (102.1 kg)   LMP 08/18/2018   SpO2 97%   BMI 41.15 kg/m  General:   Alert, NAD Lungs:  Clear .   Heart:  Regular rate and rhythm Abdomen:  Soft, nontender and nondistended. Neuro/Psych:  Alert and cooperative. Normal mood and affect. A and O x 3  Reviewed labs, radiology imaging, old records and pertinent past GI work up  Patient is appropriate for planned procedure(s) and anesthesia in an ambulatory setting   K. Scherry Ran , MD [phone]

## 2023-07-15 NOTE — Patient Instructions (Signed)

## 2023-07-15 NOTE — Progress Notes (Signed)
 Report given to PACU, vss

## 2023-07-16 ENCOUNTER — Telehealth: Payer: Self-pay

## 2023-07-16 NOTE — Telephone Encounter (Signed)
  Follow up Call-     07/15/2023    7:51 AM 02/24/2022    1:51 PM  Call back number  Post procedure Call Back phone  # [phone]  Permission to leave phone message Yes Yes     Patient questions:  Do you have a fever, pain , or abdominal swelling? No. Pain Score  0 *  Have you tolerated food without any problems? Yes.    Have you been able to return to your normal activities? Yes.    Do you have any questions about your discharge instructions: Diet   No. Medications  No. Follow up visit  No.  Do you have questions or concerns about your Care? No.  Actions: * If pain score is 4 or above: No action needed, pain <4.

## 2023-07-29 ENCOUNTER — Ambulatory Visit
Admission: RE | Admit: 2023-07-29 | Discharge: 2023-07-29 | Disposition: A | Discharge status: Home / Self Care | Payer: 59 | Source: Ambulatory Visit | Attending: Internal Medicine | Admitting: Internal Medicine

## 2023-07-29 DIAGNOSIS — Z0000 Encounter for general adult medical examination without abnormal findings: Secondary | ICD-10-CM

## 2023-07-30 ENCOUNTER — Other Ambulatory Visit: Payer: Self-pay | Admitting: Physician Assistant

## 2023-07-30 NOTE — Telephone Encounter (Signed)
 Last Fill: 04/30/2023  Labs: 03/03/2023 WBC 14.0, Hgb 11.8, RDW 17.0, Glucose 118, Calcium 8.8   Next Visit: 09/07/2023   Last Visit: 04/07/2023   DX: Rheumatoid arthritis involving multiple sites with positive rheumatoid factor   Current Dose per office note 04/07/2023: methotrexate 25 mg sq injections once weekly   Patient advised she is due to update labs. Patient states she will come next week to update.   Okay to refill Rasuvo?

## 2023-07-30 NOTE — Telephone Encounter (Signed)
 Last Fill: 05/04/2023  Labs: 03/03/2023 WBC 14.0, hemoglobin 11.8, RDW 17.0, Glucose 118, Calcium 8.8,   TB Gold: 12/18/2022  TB Gold negative.    Next Visit: 09/07/2023   Last Visit: 04/07/2023   [id number] arthritis involving multiple sites with positive rheumatoid factor   Current Dose per office note 04/07/2023: Rinvoq 15 mg 1 tablet by mouth daily   Patient advised she is due to update labs. Patient states she will come next week to update.  Okay to refill Rinvoq?

## 2023-08-06 ENCOUNTER — Other Ambulatory Visit: Payer: Self-pay | Admitting: *Deleted

## 2023-08-06 DIAGNOSIS — Z13220 Encounter for screening for lipoid disorders: Secondary | ICD-10-CM

## 2023-08-06 DIAGNOSIS — Z79899 Other long term (current) drug therapy: Secondary | ICD-10-CM

## 2023-08-06 DIAGNOSIS — M0579 Rheumatoid arthritis with rheumatoid factor of multiple sites without organ or systems involvement: Secondary | ICD-10-CM

## 2023-08-07 LAB — CBC WITH DIFFERENTIAL/PLATELET
Absolute Lymphocytes: 2316 {cells}/uL (ref 850–3900)
Absolute Monocytes: 623 {cells}/uL (ref 200–950)
Basophils Absolute: 33 {cells}/uL (ref 0–200)
Basophils Relative: 0.4 %
Eosinophils Absolute: 108 {cells}/uL (ref 15–500)
Eosinophils Relative: 1.3 %
HCT: 39.1 % (ref 35.0–45.0)
Hemoglobin: 12.8 g/dL (ref 11.7–15.5)
MCH: 30.7 pg (ref 27.0–33.0)
MCHC: 32.7 g/dL (ref 32.0–36.0)
MCV: 93.8 fL (ref 80.0–100.0)
MPV: 10.3 fL (ref 7.5–12.5)
Monocytes Relative: 7.5 %
Neutro Abs: 5221 {cells}/uL (ref 1500–7800)
Neutrophils Relative %: 62.9 %
Platelets: 325 10*3/uL (ref 140–400)
RBC: 4.17 10*6/uL (ref 3.80–5.10)
RDW: 15.7 % — ABNORMAL HIGH (ref 11.0–15.0)
Total Lymphocyte: 27.9 %
WBC: 8.3 10*3/uL (ref 3.8–10.8)

## 2023-08-07 LAB — COMPREHENSIVE METABOLIC PANEL
AG Ratio: 1.3 (calc) (ref 1.0–2.5)
ALT: 16 U/L (ref 6–29)
AST: 17 U/L (ref 10–35)
Albumin: 4 g/dL (ref 3.6–5.1)
Alkaline phosphatase (APISO): 72 U/L (ref 37–153)
BUN: 15 mg/dL (ref 7–25)
CO2: 29 mmol/L (ref 20–32)
Calcium: 9.3 mg/dL (ref 8.6–10.4)
Chloride: 103 mmol/L (ref 98–110)
Creat: 0.55 mg/dL (ref 0.50–1.03)
Globulin: 3.2 g/dL (ref 1.9–3.7)
Glucose, Bld: 88 mg/dL (ref 65–99)
Potassium: 4.4 mmol/L (ref 3.5–5.3)
Sodium: 139 mmol/L (ref 135–146)
Total Bilirubin: 0.3 mg/dL (ref 0.2–1.2)
Total Protein: 7.2 g/dL (ref 6.1–8.1)
eGFR: 106 mL/min/{1.73_m2} (ref >=60)

## 2023-08-07 LAB — LIPID PANEL
Cholesterol: 151 mg/dL (ref <200)
HDL: 63 mg/dL (ref >=50)
LDL Cholesterol (Calc): 70 mg/dL
Non-HDL Cholesterol (Calc): 88 mg/dL (ref <130)
Total CHOL/HDL Ratio: 2.4 (calc) (ref <5.0)
Triglycerides: 98 mg/dL (ref <150)

## 2023-08-07 NOTE — Progress Notes (Signed)
CBC is stable.

## 2023-08-07 NOTE — Progress Notes (Signed)
 Lipid panel WNL CMP WNL

## 2023-08-09 ENCOUNTER — Other Ambulatory Visit: Payer: Self-pay | Admitting: Physician Assistant

## 2023-08-10 NOTE — Telephone Encounter (Signed)
 Last Fill: 07/28/2022  Next Visit: 09/07/2023  Last Visit: 04/06/2024  Dx: Rheumatoid arthritis involving multiple sites with positive rheumatoid factor   Current Dose per office note on 04/07/2023: Folic acid 2 mg daily   Okay to refill Folic Acid?

## 2023-08-24 NOTE — Progress Notes (Deleted)
 Office Visit Note  Patient: Donna Lopez             Date of Birth: 1965-09-05           MRN: [id number]             PCP: Melida Quitter, MD Referring: Melida Quitter, MD Visit Date: 09/07/2023 Occupation: @GUAROCC @  Subjective:  No chief complaint on file.   History of Present Illness: Donna Lopez is a 58 y.o. female ***     Activities of Daily Living:  Patient reports morning stiffness for *** {minute/hour:19697}.   Patient {ACTIONS;DENIES/REPORTS:21021675::"Denies"} nocturnal pain.  Difficulty dressing/grooming: {ACTIONS;DENIES/REPORTS:21021675::"Denies"} Difficulty climbing stairs: {ACTIONS;DENIES/REPORTS:21021675::"Denies"} Difficulty getting out of chair: {ACTIONS;DENIES/REPORTS:21021675::"Denies"} Difficulty using hands for taps, buttons, cutlery, and/or writing: {ACTIONS;DENIES/REPORTS:21021675::"Denies"}  No Rheumatology ROS completed.   PMFS History:  Patient Active Problem List   Diagnosis Date Noted   S/P repair of paraesophageal hernia 03/02/2023   S/P laparoscopic procedure 03/02/2023   Dysphagia 06/25/2022   Age-related osteoporosis without current pathological fracture 08/15/2021   Pleural effusion on left 04/22/2021   Preventative health care 04/01/2021   Unilateral primary osteoarthritis, left knee 12/17/2018   Status post total left knee replacement 12/17/2018   Primary osteoarthritis of left knee 11/23/2018   Carpal tunnel syndrome, left upper limb 11/23/2018   BMI 40.0-44.9, adult (HCC) 07/12/2018   Primary osteoarthritis of both knees 12/24/2016   DDD (degenerative disc disease), thoracic 11/17/2016   DDD (degenerative disc disease), lumbar 11/17/2016   Vitamin D deficiency 11/17/2016   History of rosacea/ History of acne 11/17/2016   High risk medications (not anticoagulants) long-term use 06/25/2016   History of degenerative disc disease 06/25/2016   Severe obesity (BMI >= 40) (HCC) 06/13/2014   Left-sided thoracic back pain 07/19/2013    PALPITATIONS 10/17/2009   UTI 05/24/2009   Rheumatoid arthritis (HCC) 05/24/2009   Hair loss 02/22/2009   VAGINAL BLEEDING 10/05/2008   Carrier of group B Streptococcus 07/28/2007   GERD 12/28/2006   History of cardiovascular disorder 12/28/2006   Hiatal hernia 12/28/2006    Past Medical History:  Diagnosis Date   Allergy    Arthritis    Carpal tunnel syndrome 10/2018   DDD (degenerative disc disease), lumbar    DDD (degenerative disc disease), thoracic    Family history of adverse reaction to anesthesia    Mothers sister never woke up from surgery   GERD (gastroesophageal reflux disease)    Grade I diastolic dysfunction 2013   Heart murmur    Heart palpitations    History of   History of hiatal hernia    Hyperlipidemia    Morbid (severe) obesity due to excess calories (HCC) 07/15/2023   bmi 41.15   OA (osteoarthritis)    Obese    Osteoporosis    Rheumatoid arthritis (HCC)    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    Vitamin D deficiency     Family History  Problem Relation Age of Onset   Uterine cancer Mother    Diabetes Father    Kidney cancer Father    Colon polyps Sister    Colon cancer Sister 6   Rectal cancer Sister    Cancer Sister        breast    Colon polyps Brother    Colon polyps Paternal Grandfather    Colon cancer Paternal Grandfather    Healthy Daughter    Esophageal cancer Neg Hx    Stomach cancer Neg Hx    Past Surgical History:  Procedure Laterality Date   COLONOSCOPY  2014   DB- normal    ESOPHAGEAL MANOMETRY N/A 04/23/2022   Procedure: ESOPHAGEAL MANOMETRY (EM);  Surgeon: Napoleon Form, MD;  Location: [facility];  Service: Gastroenterology;  Laterality: N/A;   ESOPHAGOGASTRODUODENOSCOPY N/A 03/02/2023   Procedure: ESOPHAGOGASTRODUODENOSCOPY (EGD);  Surgeon: Corliss Skains, MD;  Location: [facility];  Service: Thoracic;  Laterality: N/A;   KNEE ARTHROPLASTY Left 2020   THORACENTESIS  04/2021   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    Procedure: LEFT TOTAL KNEE ARTHROPLASTY;  Surgeon: Kathryne Hitch, MD;  Location: [facility];  Service: Orthopedics;  Laterality: Left;   UPPER GASTROINTESTINAL ENDOSCOPY     XI ROBOTIC ASSISTED PARAESOPHAGEAL HERNIA REPAIR N/A 03/02/2023   Procedure: XI ROBOTIC ASSISTED PARAESOPHAGEAL HERNIA REPAIR;  Surgeon: Corliss Skains, MD;  Location: [facility] OR;  Service: Thoracic;  Laterality: N/A;   Social History   Social History Narrative   Exercise--- sometimes walks 1x a week   Immunization History  Administered Date(s) Administered   Influenza Whole 02/08/2009   Influenza,inj,Quad PF,6+ Mos 03/09/2013, 06/13/2014, 06/16/2016, 02/14/2019, 03/30/2020, 04/01/2021   PFIZER(Purple Top)SARS-COV-2 Vaccination 08/26/2019, 09/27/2019, 05/30/2020   PNEUMOCOCCAL CONJUGATE-20 04/01/2021   Td 09/29/2001   Tdap 06/13/2014     Objective: Vital Signs: LMP 08/18/2018    Physical Exam   Musculoskeletal Exam: ***  CDAI Exam: CDAI Score: -- Patient Global: --; Provider Global: -- Swollen: --; Tender: -- Joint Exam 09/07/2023   No joint exam has been documented for this visit   There is currently no information documented on the homunculus. Go to the Rheumatology activity and complete the homunculus joint exam.  Investigation: No additional findings.  Imaging: MM 3D SCREENING MAMMOGRAM BILATERAL BREAST Result Date: 07/31/2023 CLINICAL DATA:  Screening. EXAM: DIGITAL SCREENING BILATERAL MAMMOGRAM WITH TOMOSYNTHESIS AND CAD TECHNIQUE: Bilateral screening digital craniocaudal and mediolateral oblique mammograms were obtained. Bilateral screening digital breast tomosynthesis was performed. The images were evaluated with computer-aided detection. COMPARISON:  Previous exam(s). ACR Breast Density Category a: The breasts are almost entirely fatty. FINDINGS: There are no findings suspicious for malignancy. IMPRESSION: No mammographic evidence of malignancy. A result letter of this screening mammogram  will be mailed directly to the patient. RECOMMENDATION: Screening mammogram in one year. (Code:SM-B-01Y) BI-RADS CATEGORY  1: Negative. Electronically Signed   By: Amie Portland M.D.   On: 07/31/2023 15:53    Recent Labs: Lab Results  Component Value Date   WBC 8.3 08/06/2023   HGB 12.8 08/06/2023   PLT 325 08/06/2023   NA 139 08/06/2023   K 4.4 08/06/2023   CL 103 08/06/2023   CO2 29 08/06/2023   GLUCOSE 88 08/06/2023   BUN 15 08/06/2023   CREATININE 0.55 08/06/2023   BILITOT 0.3 08/06/2023   ALKPHOS 64 02/26/2023   AST 17 08/06/2023   ALT 16 08/06/2023   PROT 7.2 08/06/2023   ALBUMIN 3.5 02/26/2023   CALCIUM 9.3 08/06/2023   GFRAA 114 10/12/2020   QFTBGOLDPLUS NEGATIVE 12/18/2022    Speciality Comments: Humira- quit working Simponi- inadequate response Orencia-since 07/2019 Reclast start 07/18/20, September 06, 2021  Procedures:  No procedures performed Allergies: Penicillins   Assessment / Plan:     Visit Diagnoses: Rheumatoid arthritis involving multiple sites with positive rheumatoid factor (HCC)  High risk medications (not anticoagulants) long-term use  Pleural effusion, left  Primary osteoarthritis of right knee  S/P total knee arthroplasty, left  DDD (degenerative disc disease), thoracic  Degeneration of intervertebral disc of lumbar region  without discogenic back pain or lower extremity pain  Age-related osteoporosis without current pathological fracture  Vitamin D deficiency  History of hiatal hernia  History of gastroesophageal reflux (GERD)  History of rosacea/ History of acne  Orders: No orders of the defined types were placed in this encounter.  No orders of the defined types were placed in this encounter.   Face-to-face time spent with patient was *** minutes. Greater than 50% of time was spent in counseling and coordination of care.  Follow-Up Instructions: No follow-ups on file.   Gearldine Bienenstock, PA-C  Note - This record has been  created using Dragon software.  Chart creation errors have been sought, but may not always  have been located. Such creation errors do not reflect on  the standard of medical care.

## 2023-08-31 ENCOUNTER — Other Ambulatory Visit: Payer: Self-pay | Admitting: Rheumatology

## 2023-09-01 NOTE — Telephone Encounter (Signed)
 Last Fill: 07/30/2023 (30 day supply)  Labs: 08/06/2023 Lipid panel WNL CBC is stable.  CMP WNL  TB Gold: 12/18/2022 Neg    Next Visit: 09/28/2023  Last Visit: 04/07/2023  [id number] arthritis involving multiple sites with positive rheumatoid factor   Current Dose per office note 04/07/2023: Rinvoq 15 mg 1 tablet by mouth daily   Okay to refill Rinvoq?

## 2023-09-07 ENCOUNTER — Ambulatory Visit: Payer: 59 | Admitting: Physician Assistant

## 2023-09-11 ENCOUNTER — Other Ambulatory Visit: Payer: Self-pay | Admitting: Physician Assistant

## 2023-09-11 DIAGNOSIS — Z79899 Other long term (current) drug therapy: Secondary | ICD-10-CM

## 2023-09-11 DIAGNOSIS — Z111 Encounter for screening for respiratory tuberculosis: Secondary | ICD-10-CM

## 2023-09-11 NOTE — Telephone Encounter (Signed)
 Last Fill: 07/30/2023 (30 day supply)  Labs: 08/06/2023 CBC is stable. Lipid panel WNL CMP WNL  Next Visit: 09/28/2023  Last Visit: 04/07/2023  DX: Rheumatoid arthritis involving multiple sites with positive rheumatoid factor   Current Dose per office note 04/07/2023: methotrexate  25 mg sq injections once weekly   Okay to refill Rasuvo ?

## 2023-09-14 NOTE — Progress Notes (Signed)
 Office Visit Note  Patient: Donna Lopez             Date of Birth: [birth year]           MRN: [id number]             PCP: Azalia Leo, MD Referring: Azalia Leo, MD Visit Date: 09/28/2023 Occupation: @GUAROCC @  Subjective:  Medication monitoring   History of Present Illness: QUINCEE AREOLA is a 58 y.o. female with history of seropositive rheumatoid arthritis and osteoarthritis.  Patient remains on Rinvoq  15 mg 1 tablet by mouth daily (started in July 2022), methotrexate  25 mg sq injections once weekly, and folic acid  2 mg daily.  She is tolerating therapy without any side effects and has not had any recent gaps in therapy.  According to the patient her insurance no longer will cover Rasuvo  so she will have to be switched back to Otrexup .  Patient is interested in restarting oral methotrexate  instead.  She denies any signs or symptoms of a rheumatoid arthritis flare.  She has not been experiencing any nocturnal pain or difficulty with ADLs.  Her morning stiffness is only been lasting for 5 to 10 minutes daily.  She denies any joint swelling at this time.  She denies any new medical conditions.  She denies any recent falls or fractures.  She continues to take a calcium  and vitamin D  supplement daily.  Activities of Daily Living:  Patient reports morning stiffness for 5-10 minutes.   Patient Denies nocturnal pain.  Difficulty dressing/grooming: Denies Difficulty climbing stairs: Denies Difficulty getting out of chair: Denies Difficulty using hands for taps, buttons, cutlery, and/or writing: Reports  Review of Systems  Constitutional:  Negative for fatigue.  HENT:  Negative for mouth sores and mouth dryness.   Eyes:  Negative for dryness.  Respiratory:  Negative for shortness of breath.   Cardiovascular:  Negative for chest pain and palpitations.  Gastrointestinal:  Negative for blood in stool, constipation and diarrhea.  Endocrine: Negative for increased urination.   Genitourinary:  Negative for involuntary urination.  Musculoskeletal:  Positive for joint pain, joint pain, joint swelling and morning stiffness. Negative for gait problem, myalgias, muscle weakness, muscle tenderness and myalgias.  Skin:  Negative for color change, rash, hair loss and sensitivity to sunlight.  Allergic/Immunologic: Negative for susceptible to infections.  Neurological:  Negative for dizziness and headaches.  Hematological:  Negative for swollen glands.  Psychiatric/Behavioral:  Negative for depressed mood and sleep disturbance. The patient is not nervous/anxious.     PMFS History:  Patient Active Problem List   Diagnosis Date Noted   S/P repair of paraesophageal hernia 03/02/2023   S/P laparoscopic procedure 03/02/2023   Dysphagia 06/25/2022   Age-related osteoporosis without current pathological fracture 08/15/2021   Pleural effusion on left 04/22/2021   Preventative health care 04/01/2021   Unilateral primary osteoarthritis, left knee 12/17/2018   Status post total left knee replacement 12/17/2018   Primary osteoarthritis of left knee 11/23/2018   Carpal tunnel syndrome, left upper limb 11/23/2018   BMI 40.0-44.9, adult (HCC) 07/12/2018   Primary osteoarthritis of both knees 12/24/2016   DDD (degenerative disc disease), thoracic 11/17/2016   DDD (degenerative disc disease), lumbar 11/17/2016   Vitamin D  deficiency 11/17/2016   History of rosacea/ History of acne 11/17/2016   High risk medications (not anticoagulants) long-term use 06/25/2016   History of degenerative disc disease 06/25/2016   Severe obesity (BMI >= 40) (HCC) 06/13/2014   Left-sided thoracic  back pain 07/19/2013   PALPITATIONS 10/17/2009   UTI 05/24/2009   Rheumatoid arthritis (HCC) 05/24/2009   Hair loss 02/22/2009   VAGINAL BLEEDING 10/05/2008   Carrier of group B Streptococcus 07/28/2007   GERD 12/28/2006   History of cardiovascular disorder 12/28/2006   Hiatal hernia 12/28/2006     Past Medical History:  Diagnosis Date   Allergy    Arthritis    Carpal tunnel syndrome 10/2018   DDD (degenerative disc disease), lumbar    DDD (degenerative disc disease), thoracic    Family history of adverse reaction to anesthesia    Mothers sister never woke up from surgery   GERD (gastroesophageal reflux disease)    Grade I diastolic dysfunction 2013   Heart murmur    Heart palpitations    History of   History of hiatal hernia    Hyperlipidemia    Morbid (severe) obesity due to excess calories (HCC) 07/15/2023   bmi 41.15   OA (osteoarthritis)    Obese    Osteoporosis    Rheumatoid arthritis (HCC)    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    Vitamin D  deficiency     Family History  Problem Relation Age of Onset   Uterine cancer Mother    Diabetes Father    Kidney cancer Father    Colon polyps Sister    Colon cancer Sister 33   Rectal cancer Sister    Cancer Sister        breast    Colon polyps Brother    Colon polyps Paternal Grandfather    Colon cancer Paternal Grandfather    Healthy Daughter    Esophageal cancer Neg Hx    Stomach cancer Neg Hx    Past Surgical History:  Procedure Laterality Date   COLONOSCOPY  2014   DB- normal    ESOPHAGEAL MANOMETRY N/A 04/23/2022   Procedure: ESOPHAGEAL MANOMETRY (EM);  Surgeon: Sergio Dandy, MD;  Location: [facility];  Service: Gastroenterology;  Laterality: N/A;   ESOPHAGOGASTRODUODENOSCOPY N/A 03/02/2023   Procedure: ESOPHAGOGASTRODUODENOSCOPY (EGD);  Surgeon: Hilarie Lovely, MD;  Location: [facility] OR;  Service: Thoracic;  Laterality: N/A;   KNEE ARTHROPLASTY Left 2020   THORACENTESIS  04/2021   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   Procedure: LEFT TOTAL KNEE ARTHROPLASTY;  Surgeon: Arnie Lao, MD;  Location: [facility];  Service: Orthopedics;  Laterality: Left;   UPPER GASTROINTESTINAL ENDOSCOPY     XI ROBOTIC ASSISTED PARAESOPHAGEAL HERNIA REPAIR N/A 03/02/2023   Procedure: XI ROBOTIC ASSISTED  PARAESOPHAGEAL HERNIA REPAIR;  Surgeon: Hilarie Lovely, MD;  Location: [facility] OR;  Service: Thoracic;  Laterality: N/A;   Social History   Social History Narrative   Exercise--- sometimes walks 1x a week   Immunization History  Administered Date(s) Administered   Influenza Whole 02/08/2009   Influenza,inj,Quad PF,6+ Mos 03/09/2013, 06/13/2014, 06/16/2016, 02/14/2019, 03/30/2020, 04/01/2021   PFIZER(Purple Top)SARS-COV-2 Vaccination 08/26/2019, 09/27/2019, 05/30/2020   PNEUMOCOCCAL CONJUGATE-20 04/01/2021   Td 09/29/2001   Tdap 06/13/2014     Objective: Vital Signs: BP 122/76 (BP Location: Left Arm, Patient Position: Sitting, Cuff Size: Normal)   Pulse 78   Resp 16   Ht 5\' 2"  (1.575 m)   Wt 239 lb 6.4 oz (108.6 kg)   LMP 08/18/2018   BMI 43.79 kg/m    Physical Exam Vitals and nursing note reviewed.  Constitutional:      Appearance: She is well-developed.  HENT:     Head: Normocephalic and atraumatic.  Eyes:  Conjunctiva/sclera: Conjunctivae normal.  Cardiovascular:     Rate and Rhythm: Normal rate and regular rhythm.     Heart sounds: Normal heart sounds.  Pulmonary:     Effort: Pulmonary effort is normal.     Breath sounds: Normal breath sounds.  Abdominal:     General: Bowel sounds are normal.     Palpations: Abdomen is soft.  Musculoskeletal:     Cervical back: Normal range of motion.  Lymphadenopathy:     Cervical: No cervical adenopathy.  Skin:    General: Skin is warm and dry.     Capillary Refill: Capillary refill takes less than 2 seconds.  Neurological:     Mental Status: She is alert and oriented to person, place, and time.  Psychiatric:        Behavior: Behavior normal.      Musculoskeletal Exam: C-spine has good ROM.  Thoracic kyphosis noted.  Shoulder joints have good ROM with no discomfort.  Elbow joints have good ROM with no tenderness along the joint line. Limited flexion and extension of both wrist joints with prominence of bilateral  ulnar styloids.  No synovitis of wrist joints noted.  Synovial thickening of MCP joints but no synovitis noted.  PIP and DIP thickening consistent with osteoarthritis of both hands.  Hip joints have good range of motion with no groin pain.  Left knee replacement has good range of motion with mild warmth.  Right knee joint has good range of motion no warmth or effusion.  Ankle joints have good range of motion with no tenderness or joint swelling.  CDAI Exam: CDAI Score: -- Patient Global: --; Provider Global: -- Swollen: --; Tender: -- Joint Exam 09/28/2023   No joint exam has been documented for this visit   There is currently no information documented on the homunculus. Go to the Rheumatology activity and complete the homunculus joint exam.  Investigation: No additional findings.  Imaging: No results found.   Recent Labs: Lab Results  Component Value Date   WBC 8.3 08/06/2023   HGB 12.8 08/06/2023   PLT 325 08/06/2023   NA 139 08/06/2023   K 4.4 08/06/2023   CL 103 08/06/2023   CO2 29 08/06/2023   GLUCOSE 88 08/06/2023   BUN 15 08/06/2023   CREATININE 0.55 08/06/2023   BILITOT 0.3 08/06/2023   ALKPHOS 64 02/26/2023   AST 17 08/06/2023   ALT 16 08/06/2023   PROT 7.2 08/06/2023   ALBUMIN 3.5 02/26/2023   CALCIUM  9.3 08/06/2023   GFRAA 114 10/12/2020   QFTBGOLDPLUS NEGATIVE 12/18/2022    Speciality Comments: Humira - quit working Simponi - inadequate response Orencia -since 07/2019 Reclast  start 07/18/20, September 06, 2021  Procedures:  No procedures performed Allergies: Penicillins    Assessment / Plan:     Visit Diagnoses: Rheumatoid arthritis involving multiple sites with positive rheumatoid factor (HCC) - Positive rheumatoid factor, positive anti-CCP, erosive disease: She has no synovitis on examination today.  She has not had any signs or symptoms of a flare.  Her morning stiffness has been lasting 5 to 10 minutes daily.  She has not had any nocturnal pain or  difficulty performing ADLs.  She is been taking Rinvoq  15 mg 1 tablet by mouth daily and Rasuvo  25 mg sq injections once weekly.  She has not had any interruptions in therapy.  According to the patient her insurance no longer will cover Rasuvo  so she will have to switch back to Otrexup .  The patient would like to switch back to  oral methotrexate  which she has tolerated in the past.  Plan to send in methotrexate  10 tablets by mouth once weekly.  Discussed the option of trying to reduce the dose of methotrexate  since she has clinically been doing well.  She will take methotrexate  10 tablets by mouth once weekly for several weeks and if she has no side effects or new symptoms she will then try reducing to 8 tablets once weekly. She will remain on rinvoq  as combination therapy.  Plan to update X-rays of both hands and both feet at next visit.  She was advised to notify us  if she develops signs or symptoms of a flare.  She will follow up in 5 months or sooner if needed.   High risk medications (not anticoagulants) long-term use - Rinvoq  15 mg 1 tablet by mouth daily (started in July 2022), methotrexate  10 tablets by mouth once weekly, and folic acid  2 mg daily. CBC and CMP were drawn on 08/06/2023.  Her next lab work will be due in June and every 3 months to monitor for drug toxicity.  Standing orders for CBC and CMP remain in place.  Lipid panel updated on 08/06/2023. TB Gold negative on 12/18/2022 No recent or recurrent infections.  Discussed the importance of holding Rinvoq  and methotrexate  if she develops signs or symptoms of an infection and to resume once the infection has completely cleared.  Pleural effusion, left - Chest x-ray on 04/16/21: findings: small left effusion and bibasilar atelectasis.  Underwent thoracentesis on 04/25/2021.  No new or worsening pulmonary symptoms.   Primary osteoarthritis of right knee: No warmth or effusion noted.    S/P total knee arthroplasty, left - 12/17/2018 by Dr.  Lucienne Ryder. Doing well.  No effusion. Mild warmth noted.   DDD (degenerative disc disease), thoracic: Thoracic kyphosis noted.    Degeneration of intervertebral disc of lumbar region without discogenic back pain or lower extremity pain: Not currently symptomatic.   Age-related osteoporosis without current pathological fracture - DEXA updated on 03/05/2022: Left femoral neck BMD 0.605 with T-score -2.2.  Last IV Reclast  infusion was administered on 09/06/21. Currently on a drug holiday. She is taking a calcium  and vitamin D  supplement daily. No recent falls or fractures.  Due to update DEXA in October 2025.    Vitamin D  deficiency: She is taking a calcium  and vitamin D  supplement daily.    Other medical conditions are listed as follows:   History of hiatal hernia - S/p robotic assisted paraesophageal hernia repair on 03/02/23.  No complications or signs of infection.   History of gastroesophageal reflux (GERD)  History of rosacea/ History of acne  Orders: No orders of the defined types were placed in this encounter.  No orders of the defined types were placed in this encounter.    Follow-Up Instructions: Return in about 5 months (around 02/28/2024) for Rheumatoid arthritis, Osteoarthritis.   Romayne Clubs, PA-C  Note - This record has been created using Dragon software.  Chart creation errors have been sought, but may not always  have been located. Such creation errors do not reflect on  the standard of medical care.

## 2023-09-28 ENCOUNTER — Other Ambulatory Visit: Payer: Self-pay

## 2023-09-28 ENCOUNTER — Encounter: Payer: Self-pay | Admitting: Physician Assistant

## 2023-09-28 ENCOUNTER — Ambulatory Visit: Attending: Physician Assistant | Admitting: Physician Assistant

## 2023-09-28 VITALS — BP 122/76 | HR 78 | Resp 16 | Ht 62.0 in | Wt 239.4 lb

## 2023-09-28 DIAGNOSIS — Z872 Personal history of diseases of the skin and subcutaneous tissue: Secondary | ICD-10-CM

## 2023-09-28 DIAGNOSIS — J90 Pleural effusion, not elsewhere classified: Secondary | ICD-10-CM | POA: Diagnosis not present

## 2023-09-28 DIAGNOSIS — Z79899 Other long term (current) drug therapy: Secondary | ICD-10-CM

## 2023-09-28 DIAGNOSIS — M5134 Other intervertebral disc degeneration, thoracic region: Secondary | ICD-10-CM

## 2023-09-28 DIAGNOSIS — M51369 Other intervertebral disc degeneration, lumbar region without mention of lumbar back pain or lower extremity pain: Secondary | ICD-10-CM

## 2023-09-28 DIAGNOSIS — M1711 Unilateral primary osteoarthritis, right knee: Secondary | ICD-10-CM | POA: Diagnosis not present

## 2023-09-28 DIAGNOSIS — M810 Age-related osteoporosis without current pathological fracture: Secondary | ICD-10-CM

## 2023-09-28 DIAGNOSIS — M0579 Rheumatoid arthritis with rheumatoid factor of multiple sites without organ or systems involvement: Secondary | ICD-10-CM

## 2023-09-28 DIAGNOSIS — Z8719 Personal history of other diseases of the digestive system: Secondary | ICD-10-CM

## 2023-09-28 DIAGNOSIS — E559 Vitamin D deficiency, unspecified: Secondary | ICD-10-CM

## 2023-09-28 DIAGNOSIS — Z96652 Presence of left artificial knee joint: Secondary | ICD-10-CM

## 2023-09-28 MED ORDER — METHOTREXATE SODIUM 2.5 MG PO TABS: 25.0000 mg | ORAL_TABLET | ORAL | 0 refills | Status: DC

## 2023-09-28 NOTE — Telephone Encounter (Signed)
 Please review and sign Methotrexate  10 tablets prescription

## 2023-09-28 NOTE — Patient Instructions (Signed)

## 2023-11-02 ENCOUNTER — Telehealth: Payer: Self-pay | Admitting: Pharmacist

## 2023-11-02 NOTE — Telephone Encounter (Signed)
 Submitted a Prior Authorization renewal request to [facility] for RINVOQ  via CoverMyMeds. Will update once we receive a response.  Key: [id number]

## 2023-11-03 NOTE — Telephone Encounter (Signed)
 Received notification from [facility] regarding a prior authorization for RINVOQ . Authorization has been APPROVED from 11/03/2023 to 11/02/2024. Approval letter sent to scan center.  Authorization # [id number]  Geraldene Kleine, PharmD, MPH, BCPS, CPP Clinical Pharmacist (Rheumatology and Pulmonology)

## 2023-11-26 ENCOUNTER — Other Ambulatory Visit: Payer: Self-pay | Admitting: Physician Assistant

## 2023-11-26 NOTE — Telephone Encounter (Signed)
 Last Fill: 09/01/2023  Labs: 08/06/2023 Lipid panel WNL  CMP WNL CBC is stable.   TB Gold: 12/18/2022 Neg    Next Visit: 03/01/2024  Last Visit: 09/28/2023  DX: Rheumatoid arthritis involving multiple sites with positive rheumatoid factor   Current Dose per office note 09/28/2023: Rinvoq  15 mg 1 tablet by mouth daily   Patient advised she is due to update labs. Patient states she is going on vacation and will come in when she returns to update.   Okay to refill Rinvoq ?

## 2023-12-01 ENCOUNTER — Other Ambulatory Visit: Payer: Self-pay | Admitting: Physician Assistant

## 2023-12-14 ENCOUNTER — Other Ambulatory Visit: Payer: Self-pay

## 2023-12-14 DIAGNOSIS — Z79899 Other long term (current) drug therapy: Secondary | ICD-10-CM

## 2023-12-14 DIAGNOSIS — Z111 Encounter for screening for respiratory tuberculosis: Secondary | ICD-10-CM

## 2023-12-15 ENCOUNTER — Ambulatory Visit: Payer: Self-pay | Admitting: Physician Assistant

## 2023-12-15 NOTE — Progress Notes (Signed)
 Glucose is 108.  Rest of CMP WNL.  CBC WNL

## 2023-12-16 LAB — QUANTIFERON-TB GOLD PLUS
Mitogen-NIL: 3.64 [IU]/mL
NIL: 0.02 [IU]/mL
QuantiFERON-TB Gold Plus: NEGATIVE
TB1-NIL: <0 [IU]/mL
TB2-NIL: 0 [IU]/mL

## 2023-12-16 LAB — CBC WITH DIFFERENTIAL/PLATELET
Absolute Lymphocytes: 1812 {cells}/uL (ref 850–3900)
Absolute Monocytes: 630 {cells}/uL (ref 200–950)
Basophils Absolute: 18 {cells}/uL (ref 0–200)
Basophils Relative: 0.3 %
Eosinophils Absolute: 120 {cells}/uL (ref 15–500)
Eosinophils Relative: 2 %
HCT: 42.2 % (ref 35.0–45.0)
Hemoglobin: 13.4 g/dL (ref 11.7–15.5)
MCH: 30.9 pg (ref 27.0–33.0)
MCHC: 31.8 g/dL — ABNORMAL LOW (ref 32.0–36.0)
MCV: 97.2 fL (ref 80.0–100.0)
MPV: 9.9 fL (ref 7.5–12.5)
Monocytes Relative: 10.5 %
Neutro Abs: 3420 {cells}/uL (ref 1500–7800)
Neutrophils Relative %: 57 %
Platelets: 305 Thousand/uL (ref 140–400)
RBC: 4.34 Million/uL (ref 3.80–5.10)
RDW: 14.9 % (ref 11.0–15.0)
Total Lymphocyte: 30.2 %
WBC: 6 Thousand/uL (ref 3.8–10.8)

## 2023-12-16 LAB — COMPREHENSIVE METABOLIC PANEL WITH GFR
AG Ratio: 1.5 (calc) (ref 1.0–2.5)
ALT: 22 U/L (ref 6–29)
AST: 22 U/L (ref 10–35)
Albumin: 4.1 g/dL (ref 3.6–5.1)
Alkaline phosphatase (APISO): 61 U/L (ref 37–153)
BUN: 18 mg/dL (ref 7–25)
CO2: 28 mmol/L (ref 20–32)
Calcium: 9.3 mg/dL (ref 8.6–10.4)
Chloride: 103 mmol/L (ref 98–110)
Creat: 0.58 mg/dL (ref 0.50–1.03)
Globulin: 2.8 g/dL (ref 1.9–3.7)
Glucose, Bld: 108 mg/dL — ABNORMAL HIGH (ref 65–99)
Potassium: 4.3 mmol/L (ref 3.5–5.3)
Sodium: 140 mmol/L (ref 135–146)
Total Bilirubin: 0.3 mg/dL (ref 0.2–1.2)
Total Protein: 6.9 g/dL (ref 6.1–8.1)
eGFR: 104 mL/min/1.73m2 (ref >=60)

## 2023-12-16 NOTE — Progress Notes (Signed)
 TB gold negative

## 2024-01-01 ENCOUNTER — Other Ambulatory Visit: Payer: Self-pay | Admitting: Physician Assistant

## 2024-01-01 NOTE — Telephone Encounter (Signed)
 Last Fill: 11/26/2023  Labs: 12/14/2023 Glucose is 108. Rest of CMP WNL. CBC WNL  TB Gold: 12/14/2023 Negative   Next Visit: 03/01/2024  Last Visit: 09/28/2023  IK:Myzlfjunpi arthritis involving multiple sites with positive rheumatoid factor.  Current Dose per office note 09/28/2023: Rinvoq  15 mg 1 tablet by mouth daily.  Okay to refill Rinvoq ?

## 2024-01-03 ENCOUNTER — Other Ambulatory Visit: Payer: Self-pay | Admitting: Physician Assistant

## 2024-01-04 NOTE — Telephone Encounter (Signed)
 Last Fill: 09/28/2023  Labs: 12/14/2023 Glucose is 108. Rest of CMP WNL.   CBC WNL  Next Visit: 03/01/2024  Last Visit: 09/28/2023  DX: Rheumatoid arthritis involving multiple sites with positive rheumatoid factor   Current Dose per office note 09/28/2023: methotrexate  10 tablets by mouth once weekly   Okay to refill Methotrexate ?

## 2024-02-16 NOTE — Progress Notes (Signed)
 Office Visit Note  Patient: Donna Lopez             Date of Birth: 03/17/1966           MRN: [id number]             PCP: Stephane Leita DEL, MD Referring: Stephane Leita DEL, MD Visit Date: 03/01/2024 Occupation: Data Unavailable  Subjective:  Medication management   History of Present Illness: Donna Lopez is a 58 y.o. female with seropositive rheumatoid arthritis.  She returns today after her last visit in May 2025.  She states she got some new orthotics.  She states when she tried wearing the orthotic she started having pain in the right trochanteric region.  She states the pain was severe enough for her to give up the orthotics.  She states the discomfort is gradually getting better.  Of the other joints are painful.  Has been on Rinvoq  15 mg daily without any interruption.  She is also taking methotrexate  10 tablets p.o. weekly along with folic acid  2 mg daily.  She denies any shortness of breath.  She denies any discomfort in her back today.    Activities of Daily Living:  Patient reports morning stiffness for 0 minute.   Patient Denies nocturnal pain.  Difficulty dressing/grooming: Denies Difficulty climbing stairs: Reports Difficulty getting out of chair: Denies Difficulty using hands for taps, buttons, cutlery, and/or writing: Reports  Review of Systems  Constitutional:  Negative for fatigue.  HENT:  Negative for mouth sores and mouth dryness.   Eyes:  Negative for dryness.  Respiratory:  Negative for shortness of breath.   Cardiovascular:  Negative for chest pain and palpitations.  Gastrointestinal:  Negative for blood in stool, constipation and diarrhea.  Endocrine: Negative for increased urination.  Genitourinary:  Negative for involuntary urination.  Musculoskeletal:  Positive for joint pain, joint pain and joint swelling. Negative for gait problem, myalgias, muscle weakness, morning stiffness, muscle tenderness and myalgias.  Skin:  Positive for sensitivity to  sunlight. Negative for color change, rash and hair loss.  Allergic/Immunologic: Negative for susceptible to infections.  Neurological:  Negative for dizziness and headaches.  Hematological:  Negative for swollen glands.  Psychiatric/Behavioral:  Negative for depressed mood and sleep disturbance. The patient is not nervous/anxious.     PMFS History:  Patient Active Problem List   Diagnosis Date Noted   S/P repair of paraesophageal hernia 03/02/2023   S/P laparoscopic procedure 03/02/2023   Dysphagia 06/25/2022   Age-related osteoporosis without current pathological fracture 08/15/2021   Pleural effusion on left 04/22/2021   Preventative health care 04/01/2021   Unilateral primary osteoarthritis, left knee 12/17/2018   Status post total left knee replacement 12/17/2018   Primary osteoarthritis of left knee 11/23/2018   Carpal tunnel syndrome, left upper limb 11/23/2018   BMI 40.0-44.9, adult (HCC) 07/12/2018   Primary osteoarthritis of both knees 12/24/2016   DDD (degenerative disc disease), thoracic 11/17/2016   DDD (degenerative disc disease), lumbar 11/17/2016   Vitamin D  deficiency 11/17/2016   History of rosacea/ History of acne 11/17/2016   High risk medications (not anticoagulants) long-term use 06/25/2016   History of degenerative disc disease 06/25/2016   Severe obesity (BMI >= 40) (HCC) 06/13/2014   Left-sided thoracic back pain 07/19/2013   PALPITATIONS 10/17/2009   UTI 05/24/2009   Rheumatoid arthritis (HCC) 05/24/2009   Hair loss 02/22/2009   VAGINAL BLEEDING 10/05/2008   Carrier of group B Streptococcus 07/28/2007   GERD 12/28/2006  History of cardiovascular disorder 12/28/2006   Hiatal hernia 12/28/2006    Past Medical History:  Diagnosis Date   Allergy    Arthritis    Carpal tunnel syndrome 10/2018   DDD (degenerative disc disease), lumbar    DDD (degenerative disc disease), thoracic    Family history of adverse reaction to anesthesia    Mothers sister  never woke up from surgery   GERD (gastroesophageal reflux disease)    Grade I diastolic dysfunction 2013   Heart murmur    Heart palpitations    History of   History of hiatal hernia    Hyperlipidemia    Morbid (severe) obesity due to excess calories (HCC) 07/15/2023   bmi 41.15   OA (osteoarthritis)    Obese    Osteoporosis    Rheumatoid arthritis (HCC)    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    Vitamin D  deficiency     Family History  Problem Relation Age of Onset   Uterine cancer Mother    Diabetes Father    Kidney cancer Father    Colon polyps Sister    Colon cancer Sister 68   Rectal cancer Sister    Cancer Sister        breast    Colon polyps Brother    Colon polyps Paternal Grandfather    Colon cancer Paternal Grandfather    Healthy Daughter    Esophageal cancer Neg Hx    Stomach cancer Neg Hx    Past Surgical History:  Procedure Laterality Date   COLONOSCOPY  2014   DB- normal    ESOPHAGEAL MANOMETRY N/A 04/23/2022   Procedure: ESOPHAGEAL MANOMETRY (EM);  Surgeon: Shila Gustav GAILS, MD;  Location: [facility];  Service: Gastroenterology;  Laterality: N/A;   ESOPHAGOGASTRODUODENOSCOPY N/A 03/02/2023   Procedure: ESOPHAGOGASTRODUODENOSCOPY (EGD);  Surgeon: Shyrl Linnie KIDD, MD;  Location: [facility] OR;  Service: Thoracic;  Laterality: N/A;   KNEE ARTHROPLASTY Left 2020   THORACENTESIS  04/2021   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   Procedure: LEFT TOTAL KNEE ARTHROPLASTY;  Surgeon: Vernetta Lonni GRADE, MD;  Location: [facility];  Service: Orthopedics;  Laterality: Left;   UPPER GASTROINTESTINAL ENDOSCOPY     XI ROBOTIC ASSISTED PARAESOPHAGEAL HERNIA REPAIR N/A 03/02/2023   Procedure: XI ROBOTIC ASSISTED PARAESOPHAGEAL HERNIA REPAIR;  Surgeon: Shyrl Linnie KIDD, MD;  Location: [facility] OR;  Service: Thoracic;  Laterality: N/A;   Social History   Tobacco Use   Smoking status: Never    Passive exposure: Never   Smokeless tobacco: Never  Vaping Use   Vaping status:  Never Used  Substance Use Topics   Alcohol  use: Yes    Comment: rare alcohol  use-once/twice a year   Drug use: Never   Social History   Social History Narrative   Exercise--- sometimes walks 1x a week     Immunization History  Administered Date(s) Administered   Influenza Whole 02/08/2009   Influenza,inj,Quad PF,6+ Mos 03/09/2013, 06/13/2014, 06/16/2016, 02/14/2019, 03/30/2020, 04/01/2021   PFIZER(Purple Top)SARS-COV-2 Vaccination 08/26/2019, 09/27/2019, 05/30/2020   PNEUMOCOCCAL CONJUGATE-20 04/01/2021   Td 09/29/2001   Tdap 06/13/2014     Objective: Vital Signs: BP 116/78   Pulse 93   Temp 98 F (36.7 C)   Resp 14   Ht 5' 2 (1.575 m)   Wt 243 lb 9.6 oz (110.5 kg)   LMP 08/18/2018   BMI 44.56 kg/m    Physical Exam Vitals and nursing note reviewed.  Constitutional:      Appearance: She is well-developed.  HENT:  Head: Normocephalic and atraumatic.  Eyes:     Conjunctiva/sclera: Conjunctivae normal.  Cardiovascular:     Rate and Rhythm: Normal rate and regular rhythm.     Heart sounds: Normal heart sounds.  Pulmonary:     Effort: Pulmonary effort is normal.     Breath sounds: Normal breath sounds.  Abdominal:     General: Bowel sounds are normal.     Palpations: Abdomen is soft.  Musculoskeletal:     Cervical back: Normal range of motion.  Lymphadenopathy:     Cervical: No cervical adenopathy.  Skin:    General: Skin is warm and dry.     Capillary Refill: Capillary refill takes less than 2 seconds.  Neurological:     Mental Status: She is alert and oriented to person, place, and time.  Psychiatric:        Behavior: Behavior normal.      Musculoskeletal Exam: She had good range of motion of the cervical spine.  Thoracic kyphosis was noted.  There was no tenderness over lumbar spine.  Shoulders, and elbows were in good range of motion.  She had limited range of motion of her bilateral wrist joints without any synovitis.  MCP thickening with no  synovitis was noted.  PIP and DIP thickening was noted.  Hip joints and knee joints with good range of motion.  Left knee joint was replaced.  She had no tenderness over ankles.  She had bilateral bunion formation.  CDAI Exam: CDAI Score: -- Patient Global: --; Provider Global: -- Swollen: --; Tender: -- Joint Exam 03/01/2024   No joint exam has been documented for this visit   There is currently no information documented on the homunculus. Go to the Rheumatology activity and complete the homunculus joint exam.  Investigation: No additional findings.  Imaging: No results found.  Recent Labs: Lab Results  Component Value Date   WBC 6.0 12/14/2023   HGB 13.4 12/14/2023   PLT 305 12/14/2023   NA 140 12/14/2023   K 4.3 12/14/2023   CL 103 12/14/2023   CO2 28 12/14/2023   GLUCOSE 108 (H) 12/14/2023   BUN 18 12/14/2023   CREATININE 0.58 12/14/2023   BILITOT 0.3 12/14/2023   ALKPHOS 64 02/26/2023   AST 22 12/14/2023   ALT 22 12/14/2023   PROT 6.9 12/14/2023   ALBUMIN 3.5 02/26/2023   CALCIUM  9.3 12/14/2023   GFRAA 114 10/12/2020   QFTBGOLDPLUS NEGATIVE 12/14/2023    Speciality Comments: Humira - quit working Simponi - inadequate response Orencia -since 07/2019 Reclast  start 07/18/20, September 06, 2021  Procedures:  No procedures performed Allergies: Penicillins   Assessment / Plan:     Visit Diagnoses: Rheumatoid arthritis involving multiple sites with positive rheumatoid factor (HCC) - Positive rheumatoid factor, positive anti-CCP, erosive disease: Patient states she has been doing well on the combination of Rinvoq  and methotrexate .  She has not had any flares since the last visit.  She continues to have some morning stiffness.  She denies any interruption in the treatment.  High risk medications (not anticoagulants) long-term use - Rinvoq  15 mg 1 tablet by mouth daily (started in July 2022), methotrexate  10 tablets by mouth once weekly, and folic acid  2 mg daily.  December 14, 2023 CBC and CMP were normal.  TB Gold was negative.  Will check CBC and CMP today and every 3 months.  TB Gold will be checked annually.  Information reimmunization was placed in the AVS.  She was advised to hold Rinvoq  and methotrexate   if she develops an infection and resume once the infection resolves.  Blackbox warning associated with the use of check elevators was reviewed and the information was placed in the AVS.  Annual skin examination was advised to screen for skin cancer.  Use of sunscreen and sun protection was discussed.  Pleural effusion, left - Chest x-ray on 04/16/21: findings: small left effusion and bibasilar atelectasis.  Underwent thoracentesis on 04/25/2021.  She denies any shortness of breath.-  Primary osteoarthritis of right knee-she has intermittent discomfort.  S/P total knee arthroplasty, left - 12/17/2018 by Dr. Vernetta.  Doing well.  Primary osteoarthritis of both feet-she had bilateral bunions.  She had new orthotics which causes discomfort in the right trochanteric region.  She stopped using orthotics.  DDD (degenerative disc disease), thoracic - Thoracic kyphosis noted.  Degeneration of intervertebral disc of lumbar region without discogenic back pain or lower extremity pain-she denies any discomfort today.  Age-related osteoporosis without current pathological fracture - DEXA 03/05/2022: Left femoral neck BMD 0.605 with T-score -2.2. Last IV Reclast  infusion was administered on 09/06/21. Currently on a drug holiday.  Will schedule repeat DEXA scan.  Vitamin D  deficiency  History of hiatal hernia - S/p robotic assisted paraesophageal hernia repair on 03/02/23.  Dyslipidemia-Will check lipid panel today.  History of rosacea/ History of acne  History of gastroesophageal reflux (GERD)  Orders: Orders Placed This Encounter  Procedures   DG Bone Density   CBC with Differential/Platelet   Comprehensive metabolic panel with GFR   Lipid panel   No orders of  the defined types were placed in this encounter.   Follow-Up Instructions: Return in about 5 months (around 07/30/2024) for Rheumatoid arthritis.   Maya Nash, MD  Note - This record has been created using Animal nutritionist.  Chart creation errors have been sought, but may not always  have been located. Such creation errors do not reflect on  the standard of medical care.

## 2024-03-01 ENCOUNTER — Ambulatory Visit: Attending: Rheumatology | Admitting: Rheumatology

## 2024-03-01 ENCOUNTER — Encounter: Payer: Self-pay | Admitting: Rheumatology

## 2024-03-01 VITALS — BP 116/78 | HR 93 | Temp 98.0°F | Resp 14 | Ht 62.0 in | Wt 243.6 lb

## 2024-03-01 DIAGNOSIS — E785 Hyperlipidemia, unspecified: Secondary | ICD-10-CM

## 2024-03-01 DIAGNOSIS — J90 Pleural effusion, not elsewhere classified: Secondary | ICD-10-CM

## 2024-03-01 DIAGNOSIS — M51369 Other intervertebral disc degeneration, lumbar region without mention of lumbar back pain or lower extremity pain: Secondary | ICD-10-CM

## 2024-03-01 DIAGNOSIS — M810 Age-related osteoporosis without current pathological fracture: Secondary | ICD-10-CM

## 2024-03-01 DIAGNOSIS — M1711 Unilateral primary osteoarthritis, right knee: Secondary | ICD-10-CM

## 2024-03-01 DIAGNOSIS — M0579 Rheumatoid arthritis with rheumatoid factor of multiple sites without organ or systems involvement: Secondary | ICD-10-CM | POA: Diagnosis not present

## 2024-03-01 DIAGNOSIS — E559 Vitamin D deficiency, unspecified: Secondary | ICD-10-CM

## 2024-03-01 DIAGNOSIS — Z872 Personal history of diseases of the skin and subcutaneous tissue: Secondary | ICD-10-CM

## 2024-03-01 DIAGNOSIS — M5134 Other intervertebral disc degeneration, thoracic region: Secondary | ICD-10-CM

## 2024-03-01 DIAGNOSIS — Z96652 Presence of left artificial knee joint: Secondary | ICD-10-CM

## 2024-03-01 DIAGNOSIS — Z8719 Personal history of other diseases of the digestive system: Secondary | ICD-10-CM

## 2024-03-01 DIAGNOSIS — M19072 Primary osteoarthritis, left ankle and foot: Secondary | ICD-10-CM

## 2024-03-01 DIAGNOSIS — Z79899 Other long term (current) drug therapy: Secondary | ICD-10-CM | POA: Diagnosis not present

## 2024-03-01 DIAGNOSIS — M19071 Primary osteoarthritis, right ankle and foot: Secondary | ICD-10-CM

## 2024-03-01 NOTE — Patient Instructions (Signed)
 Standing Labs We placed an order today for your standing lab work.   Please have your standing labs drawn in January and every 3 months  Please have your labs drawn 2 weeks prior to your appointment so that the provider can discuss your lab results at your appointment, if possible.  Please note that you may see your imaging and lab results in MyChart before we have reviewed them. We will contact you once all results are reviewed. Please allow our office up to 72 hours to thoroughly review all of the results before contacting the office for clarification of your results.  WALK-IN LAB HOURS  Monday through Thursday from 8:00 am -12:30 pm and 1:00 pm-4:30 pm and Friday from 8:00 am-12:00 pm.  Patients with office visits requiring labs will be seen before walk-in labs.  You may encounter longer than normal wait times. Please allow additional time. Wait times may be shorter on  Monday and Thursday afternoons.  We do not book appointments for walk-in labs. We appreciate your patience and understanding with our staff.   Labs are drawn by [facility]. Please bring your co-pay at the time of your lab draw.  You may receive a bill from [facility] for your lab work.  Please note if you are on Hydroxychloroquine  and and an order has been placed for a Hydroxychloroquine  level,  you will need to have it drawn 4 hours or more after your last dose.  If you wish to have your labs drawn at another location, please call the office 24 hours in advance so we can fax the orders.  The office is located at [address], [place], [place] [address]   If you have any questions regarding directions or hours of operation,  please call [phone].   As a reminder, please drink plenty of water prior to coming for your lab work. Thanks!   Vaccines You are taking a medication(s) that can suppress your immune system.  The following immunizations are recommended: Flu annually(high dose) Covid-19  RSV Td/Tdap  (tetanus, diphtheria, pertussis) every 10 years Pneumonia (Prevnar 15 then Pneumovax 23 at least 1 year apart.  Alternatively, can take Prevnar 20 without needing additional dose) Shingrix: 2 doses from 4 weeks to 6 months apart  Please check with your PCP to make sure you are up to date.   If you have signs or symptoms of an infection or start antibiotics: First, call your PCP for workup of your infection. Hold your medication through the infection, until you complete your antibiotics, and until symptoms resolve if you take the following: Injectable medication (Actemra, Benlysta, Cimzia, Cosentyx, Enbrel, Humira , Kevzara, Orencia , Remicade, Simponi , Stelara, Taltz, Tremfya) Methotrexate  Leflunomide (Arava) Mycophenolate (Cellcept) Xeljanz, Olumiant, or Rinvoq    Because you are taking Xeljanz, Rinvoq , or Olumiant, it is very important to know that this class of medications has a FDA BOXED WARNING for major adverse cardiovascular events (MACE), thrombosis, mortality (including sudden cardiovascular death), serious infections, and lymphomas. MACE is defined as cardiovascular death, myocardial infarction, and stroke. Thrombosis includes deep venous thrombosis (DVT), pulmonary embolism (PE), and arterial thrombosis. If you are a current or former smoker, you are at higher risk for MACE.   Please get an annual skin examination to screen for skin cancer while you are on Rinvoq .  Please use sunscreen and sun protection.

## 2024-03-02 ENCOUNTER — Ambulatory Visit: Payer: Self-pay | Admitting: Rheumatology

## 2024-03-02 LAB — COMPREHENSIVE METABOLIC PANEL WITH GFR
AG Ratio: 1.3 (calc) (ref 1.0–2.5)
ALT: 21 U/L (ref 6–29)
AST: 22 U/L (ref 10–35)
Albumin: 4 g/dL (ref 3.6–5.1)
Alkaline phosphatase (APISO): 63 U/L (ref 37–153)
BUN: 17 mg/dL (ref 7–25)
CO2: 29 mmol/L (ref 20–32)
Calcium: 9.2 mg/dL (ref 8.6–10.4)
Chloride: 104 mmol/L (ref 98–110)
Creat: 0.56 mg/dL (ref 0.50–1.03)
Globulin: 3.2 g/dL (ref 1.9–3.7)
Glucose, Bld: 93 mg/dL (ref 65–99)
Potassium: 4.1 mmol/L (ref 3.5–5.3)
Sodium: 140 mmol/L (ref 135–146)
Total Bilirubin: 0.3 mg/dL (ref 0.2–1.2)
Total Protein: 7.2 g/dL (ref 6.1–8.1)
eGFR: 105 mL/min/1.73m2 (ref >=60)

## 2024-03-02 LAB — CBC WITH DIFFERENTIAL/PLATELET
Absolute Lymphocytes: 2223 {cells}/uL (ref 850–3900)
Absolute Monocytes: 796 {cells}/uL (ref 200–950)
Basophils Absolute: 23 {cells}/uL (ref 0–200)
Basophils Relative: 0.3 %
Eosinophils Absolute: 117 {cells}/uL (ref 15–500)
Eosinophils Relative: 1.5 %
HCT: 38.6 % (ref 35.0–45.0)
Hemoglobin: 12.8 g/dL (ref 11.7–15.5)
MCH: 31.8 pg (ref 27.0–33.0)
MCHC: 33.2 g/dL (ref 32.0–36.0)
MCV: 95.8 fL (ref 80.0–100.0)
MPV: 10.2 fL (ref 7.5–12.5)
Monocytes Relative: 10.2 %
Neutro Abs: 4641 {cells}/uL (ref 1500–7800)
Neutrophils Relative %: 59.5 %
Platelets: 289 Thousand/uL (ref 140–400)
RBC: 4.03 Million/uL (ref 3.80–5.10)
RDW: 14.4 % (ref 11.0–15.0)
Total Lymphocyte: 28.5 %
WBC: 7.8 Thousand/uL (ref 3.8–10.8)

## 2024-03-02 LAB — LIPID PANEL
Cholesterol: 152 mg/dL (ref <200)
HDL: 58 mg/dL (ref >=50)
LDL Cholesterol (Calc): 76 mg/dL
Non-HDL Cholesterol (Calc): 94 mg/dL (ref <130)
Total CHOL/HDL Ratio: 2.6 (calc) (ref <5.0)
Triglycerides: 101 mg/dL (ref <150)

## 2024-03-02 NOTE — Progress Notes (Signed)
 CBC and CMP normal, lipid panel is normal.

## 2024-04-01 ENCOUNTER — Other Ambulatory Visit: Payer: Self-pay | Admitting: Physician Assistant

## 2024-04-01 NOTE — Telephone Encounter (Signed)
 Last Fill: 01/04/2024  Labs: 03/01/2024 CBC and CMP normal, lipid panel is normal.   Next Visit: 08/01/2024  Last Visit: 03/01/2024  DX: Rheumatoid arthritis involving multiple sites with positive rheumatoid facto   Current Dose per office note 03/01/2024: methotrexate  10 tablets by mouth once weekly   Okay to refill Methotrexate ?

## 2024-04-01 NOTE — Telephone Encounter (Signed)
 Last Fill: 01/01/2024  Labs: 03/01/2024 CBC and CMP normal, lipid panel is normal.   TB Gold: 12/14/2023 Neg   Next Visit: 08/01/2024  Last Visit: 03/01/2024  DX: Rheumatoid arthritis involving multiple sites with positive rheumatoid factor   Current Dose per office note 03/01/2024: Rinvoq  15 mg 1 tablet by mouth daily   Okay to refill Rinvoq ?

## 2024-04-28 LAB — HM DEXA SCAN

## 2024-05-03 ENCOUNTER — Telehealth: Payer: Self-pay

## 2024-05-03 NOTE — Telephone Encounter (Signed)
 Received DEXA results from [facility].  Date of Scan: 04/28/2024  Lowest T-score: -1.9  BMD: 0.636  Lowest site measured: left femoral neck  DX: osteopenia  Significant changes in BMD and site measured (5% and above):n/a  Current Regimen: calcium , vitamin D , (currently on Reclast  IV drug holiday).   Recommendation: continue calcium , vitamin D , exercise. Repeat DEXA in 2 years.    Reviewed by: Dr. Dolphus  Next Appointment:  08/01/2024   Attempted to contact the patient and left message on machine to advise patient to call the office regarding recommendations.

## 2024-05-04 NOTE — Telephone Encounter (Signed)
 Called patient and advised her of recommendations:  continue calcium , vitamin D , exercise. Repeat DEXA in 2 years.   Patient verbalized understanding.

## 2024-05-23 ENCOUNTER — Encounter: Payer: Self-pay | Admitting: Rheumatology

## 2024-08-01 ENCOUNTER — Ambulatory Visit: Admitting: Physician Assistant
